# Patient Record
Sex: Male | Born: 1945 | Race: Black or African American | Hispanic: No | Smoking: Current every day smoker
Health system: Southern US, Community
[De-identification: ages and names within clinical notes are randomized; demographics above are authoritative.]

## PROBLEM LIST (undated history)

## (undated) ENCOUNTER — Emergency Department (HOSPITAL_COMMUNITY): Admission: EM | Payer: Medicare Other | Source: Home / Self Care

## (undated) DIAGNOSIS — M545 Low back pain, unspecified: Secondary | ICD-10-CM

## (undated) DIAGNOSIS — F1910 Other psychoactive substance abuse, uncomplicated: Secondary | ICD-10-CM

## (undated) DIAGNOSIS — G43909 Migraine, unspecified, not intractable, without status migrainosus: Secondary | ICD-10-CM

## (undated) DIAGNOSIS — I2510 Atherosclerotic heart disease of native coronary artery without angina pectoris: Secondary | ICD-10-CM

## (undated) DIAGNOSIS — F32A Depression, unspecified: Secondary | ICD-10-CM

## (undated) DIAGNOSIS — C189 Malignant neoplasm of colon, unspecified: Secondary | ICD-10-CM

## (undated) DIAGNOSIS — G8929 Other chronic pain: Secondary | ICD-10-CM

## (undated) DIAGNOSIS — K219 Gastro-esophageal reflux disease without esophagitis: Secondary | ICD-10-CM

## (undated) DIAGNOSIS — I5022 Chronic systolic (congestive) heart failure: Secondary | ICD-10-CM

## (undated) DIAGNOSIS — M1990 Unspecified osteoarthritis, unspecified site: Secondary | ICD-10-CM

## (undated) DIAGNOSIS — Z720 Tobacco use: Secondary | ICD-10-CM

## (undated) DIAGNOSIS — B1920 Unspecified viral hepatitis C without hepatic coma: Secondary | ICD-10-CM

## (undated) DIAGNOSIS — I4819 Other persistent atrial fibrillation: Secondary | ICD-10-CM

## (undated) DIAGNOSIS — J449 Chronic obstructive pulmonary disease, unspecified: Secondary | ICD-10-CM

## (undated) DIAGNOSIS — F419 Anxiety disorder, unspecified: Secondary | ICD-10-CM

## (undated) DIAGNOSIS — I10 Essential (primary) hypertension: Secondary | ICD-10-CM

## (undated) DIAGNOSIS — I428 Other cardiomyopathies: Secondary | ICD-10-CM

## (undated) DIAGNOSIS — F329 Major depressive disorder, single episode, unspecified: Secondary | ICD-10-CM

## (undated) HISTORY — DX: Atherosclerotic heart disease of native coronary artery without angina pectoris: I25.10

## (undated) HISTORY — DX: Other persistent atrial fibrillation: I48.19

## (undated) HISTORY — DX: Chronic systolic (congestive) heart failure: I50.22

## (undated) HISTORY — PX: CARDIOVERSION: SHX1299

## (undated) HISTORY — DX: Other cardiomyopathies: I42.8

## (undated) HISTORY — DX: Other psychoactive substance abuse, uncomplicated: F19.10

## (undated) HISTORY — PX: COLONOSCOPY: SHX174

---

## 2012-07-22 DIAGNOSIS — J449 Chronic obstructive pulmonary disease, unspecified: Secondary | ICD-10-CM | POA: Diagnosis not present

## 2012-07-22 DIAGNOSIS — R0609 Other forms of dyspnea: Secondary | ICD-10-CM | POA: Diagnosis not present

## 2012-08-14 DIAGNOSIS — I5020 Unspecified systolic (congestive) heart failure: Secondary | ICD-10-CM | POA: Insufficient documentation

## 2012-11-16 DIAGNOSIS — I509 Heart failure, unspecified: Secondary | ICD-10-CM | POA: Diagnosis not present

## 2012-11-16 DIAGNOSIS — R059 Cough, unspecified: Secondary | ICD-10-CM | POA: Diagnosis not present

## 2012-11-16 DIAGNOSIS — R05 Cough: Secondary | ICD-10-CM | POA: Diagnosis not present

## 2012-11-16 DIAGNOSIS — R0602 Shortness of breath: Secondary | ICD-10-CM | POA: Diagnosis not present

## 2012-11-16 DIAGNOSIS — R9431 Abnormal electrocardiogram [ECG] [EKG]: Secondary | ICD-10-CM | POA: Diagnosis not present

## 2012-12-01 DIAGNOSIS — R05 Cough: Secondary | ICD-10-CM | POA: Diagnosis not present

## 2012-12-01 DIAGNOSIS — R059 Cough, unspecified: Secondary | ICD-10-CM | POA: Diagnosis not present

## 2012-12-01 DIAGNOSIS — R0789 Other chest pain: Secondary | ICD-10-CM | POA: Diagnosis not present

## 2012-12-01 DIAGNOSIS — Z760 Encounter for issue of repeat prescription: Secondary | ICD-10-CM | POA: Diagnosis not present

## 2012-12-01 DIAGNOSIS — R0602 Shortness of breath: Secondary | ICD-10-CM | POA: Diagnosis not present

## 2012-12-14 DIAGNOSIS — J440 Chronic obstructive pulmonary disease with acute lower respiratory infection: Secondary | ICD-10-CM | POA: Diagnosis not present

## 2012-12-14 DIAGNOSIS — R0602 Shortness of breath: Secondary | ICD-10-CM | POA: Diagnosis not present

## 2013-01-05 DIAGNOSIS — R1013 Epigastric pain: Secondary | ICD-10-CM | POA: Diagnosis not present

## 2013-01-05 DIAGNOSIS — K2970 Gastritis, unspecified, without bleeding: Secondary | ICD-10-CM | POA: Diagnosis not present

## 2013-01-05 DIAGNOSIS — N390 Urinary tract infection, site not specified: Secondary | ICD-10-CM | POA: Diagnosis not present

## 2013-01-05 DIAGNOSIS — K2990 Gastroduodenitis, unspecified, without bleeding: Secondary | ICD-10-CM | POA: Diagnosis not present

## 2013-01-05 DIAGNOSIS — R0789 Other chest pain: Secondary | ICD-10-CM | POA: Diagnosis not present

## 2013-02-16 DIAGNOSIS — J441 Chronic obstructive pulmonary disease with (acute) exacerbation: Secondary | ICD-10-CM | POA: Diagnosis not present

## 2013-02-16 DIAGNOSIS — F17200 Nicotine dependence, unspecified, uncomplicated: Secondary | ICD-10-CM | POA: Diagnosis not present

## 2013-02-16 DIAGNOSIS — J449 Chronic obstructive pulmonary disease, unspecified: Secondary | ICD-10-CM | POA: Diagnosis not present

## 2013-02-16 DIAGNOSIS — R0609 Other forms of dyspnea: Secondary | ICD-10-CM | POA: Diagnosis not present

## 2013-02-16 DIAGNOSIS — R079 Chest pain, unspecified: Secondary | ICD-10-CM | POA: Diagnosis not present

## 2013-02-21 DIAGNOSIS — S01501A Unspecified open wound of lip, initial encounter: Secondary | ICD-10-CM | POA: Diagnosis not present

## 2013-02-21 DIAGNOSIS — S0990XA Unspecified injury of head, initial encounter: Secondary | ICD-10-CM | POA: Diagnosis not present

## 2013-02-21 DIAGNOSIS — F17200 Nicotine dependence, unspecified, uncomplicated: Secondary | ICD-10-CM | POA: Diagnosis not present

## 2013-02-21 DIAGNOSIS — S1093XA Contusion of unspecified part of neck, initial encounter: Secondary | ICD-10-CM | POA: Diagnosis not present

## 2013-02-21 DIAGNOSIS — S0083XA Contusion of other part of head, initial encounter: Secondary | ICD-10-CM | POA: Diagnosis not present

## 2013-02-21 DIAGNOSIS — T7491XA Unspecified adult maltreatment, confirmed, initial encounter: Secondary | ICD-10-CM | POA: Diagnosis not present

## 2013-02-21 DIAGNOSIS — R079 Chest pain, unspecified: Secondary | ICD-10-CM | POA: Diagnosis not present

## 2013-02-21 DIAGNOSIS — Z049 Encounter for examination and observation for unspecified reason: Secondary | ICD-10-CM | POA: Diagnosis not present

## 2013-02-21 DIAGNOSIS — S0003XA Contusion of scalp, initial encounter: Secondary | ICD-10-CM | POA: Diagnosis not present

## 2013-02-22 DIAGNOSIS — F1910 Other psychoactive substance abuse, uncomplicated: Secondary | ICD-10-CM | POA: Insufficient documentation

## 2013-02-22 DIAGNOSIS — R079 Chest pain, unspecified: Secondary | ICD-10-CM | POA: Diagnosis not present

## 2013-02-22 DIAGNOSIS — R141 Gas pain: Secondary | ICD-10-CM | POA: Diagnosis not present

## 2013-02-22 DIAGNOSIS — R8281 Pyuria: Secondary | ICD-10-CM | POA: Insufficient documentation

## 2013-02-23 DIAGNOSIS — R079 Chest pain, unspecified: Secondary | ICD-10-CM | POA: Diagnosis not present

## 2013-02-24 DIAGNOSIS — R079 Chest pain, unspecified: Secondary | ICD-10-CM | POA: Diagnosis not present

## 2013-03-24 DIAGNOSIS — R0602 Shortness of breath: Secondary | ICD-10-CM | POA: Diagnosis not present

## 2013-03-24 DIAGNOSIS — Z049 Encounter for examination and observation for unspecified reason: Secondary | ICD-10-CM | POA: Diagnosis not present

## 2013-04-29 DIAGNOSIS — I499 Cardiac arrhythmia, unspecified: Secondary | ICD-10-CM | POA: Diagnosis not present

## 2013-04-29 DIAGNOSIS — I10 Essential (primary) hypertension: Secondary | ICD-10-CM | POA: Diagnosis not present

## 2013-04-29 DIAGNOSIS — I509 Heart failure, unspecified: Secondary | ICD-10-CM | POA: Diagnosis not present

## 2013-04-29 DIAGNOSIS — IMO0002 Reserved for concepts with insufficient information to code with codable children: Secondary | ICD-10-CM | POA: Diagnosis not present

## 2013-04-29 DIAGNOSIS — Z7982 Long term (current) use of aspirin: Secondary | ICD-10-CM | POA: Diagnosis not present

## 2013-04-29 DIAGNOSIS — S0990XA Unspecified injury of head, initial encounter: Secondary | ICD-10-CM | POA: Diagnosis not present

## 2013-04-29 DIAGNOSIS — R52 Pain, unspecified: Secondary | ICD-10-CM | POA: Diagnosis not present

## 2013-04-29 DIAGNOSIS — S60229A Contusion of unspecified hand, initial encounter: Secondary | ICD-10-CM | POA: Diagnosis not present

## 2013-04-29 DIAGNOSIS — J449 Chronic obstructive pulmonary disease, unspecified: Secondary | ICD-10-CM | POA: Diagnosis not present

## 2013-04-29 DIAGNOSIS — S0003XA Contusion of scalp, initial encounter: Secondary | ICD-10-CM | POA: Diagnosis not present

## 2013-04-29 DIAGNOSIS — S1093XA Contusion of unspecified part of neck, initial encounter: Secondary | ICD-10-CM | POA: Diagnosis not present

## 2013-04-29 DIAGNOSIS — E785 Hyperlipidemia, unspecified: Secondary | ICD-10-CM | POA: Diagnosis not present

## 2013-04-29 DIAGNOSIS — F17200 Nicotine dependence, unspecified, uncomplicated: Secondary | ICD-10-CM | POA: Diagnosis not present

## 2013-04-29 DIAGNOSIS — S060X1A Concussion with loss of consciousness of 30 minutes or less, initial encounter: Secondary | ICD-10-CM | POA: Diagnosis not present

## 2013-04-29 DIAGNOSIS — Z79899 Other long term (current) drug therapy: Secondary | ICD-10-CM | POA: Diagnosis not present

## 2013-05-04 DIAGNOSIS — S022XXA Fracture of nasal bones, initial encounter for closed fracture: Secondary | ICD-10-CM | POA: Diagnosis not present

## 2013-05-04 DIAGNOSIS — S0990XA Unspecified injury of head, initial encounter: Secondary | ICD-10-CM | POA: Diagnosis not present

## 2013-05-04 DIAGNOSIS — IMO0002 Reserved for concepts with insufficient information to code with codable children: Secondary | ICD-10-CM | POA: Diagnosis not present

## 2013-05-04 DIAGNOSIS — K1370 Unspecified lesions of oral mucosa: Secondary | ICD-10-CM | POA: Diagnosis not present

## 2013-05-04 DIAGNOSIS — J329 Chronic sinusitis, unspecified: Secondary | ICD-10-CM | POA: Diagnosis not present

## 2013-06-01 DIAGNOSIS — R972 Elevated prostate specific antigen [PSA]: Secondary | ICD-10-CM | POA: Diagnosis not present

## 2013-06-06 DIAGNOSIS — F17200 Nicotine dependence, unspecified, uncomplicated: Secondary | ICD-10-CM | POA: Insufficient documentation

## 2013-06-06 DIAGNOSIS — R062 Wheezing: Secondary | ICD-10-CM | POA: Diagnosis not present

## 2013-06-06 DIAGNOSIS — R0602 Shortness of breath: Secondary | ICD-10-CM | POA: Diagnosis not present

## 2013-06-06 DIAGNOSIS — I499 Cardiac arrhythmia, unspecified: Secondary | ICD-10-CM | POA: Diagnosis not present

## 2013-06-06 DIAGNOSIS — I509 Heart failure, unspecified: Secondary | ICD-10-CM | POA: Diagnosis not present

## 2013-06-06 DIAGNOSIS — R5381 Other malaise: Secondary | ICD-10-CM | POA: Diagnosis not present

## 2013-06-06 DIAGNOSIS — R5383 Other fatigue: Secondary | ICD-10-CM | POA: Diagnosis not present

## 2013-06-06 DIAGNOSIS — R109 Unspecified abdominal pain: Secondary | ICD-10-CM | POA: Diagnosis not present

## 2013-06-06 DIAGNOSIS — J449 Chronic obstructive pulmonary disease, unspecified: Secondary | ICD-10-CM | POA: Diagnosis not present

## 2013-06-07 DIAGNOSIS — R079 Chest pain, unspecified: Secondary | ICD-10-CM | POA: Diagnosis not present

## 2013-06-07 DIAGNOSIS — I509 Heart failure, unspecified: Secondary | ICD-10-CM | POA: Diagnosis not present

## 2013-06-07 DIAGNOSIS — J440 Chronic obstructive pulmonary disease with acute lower respiratory infection: Secondary | ICD-10-CM | POA: Diagnosis not present

## 2013-06-07 DIAGNOSIS — R1012 Left upper quadrant pain: Secondary | ICD-10-CM | POA: Diagnosis not present

## 2013-06-07 DIAGNOSIS — R0789 Other chest pain: Secondary | ICD-10-CM | POA: Diagnosis not present

## 2013-06-08 DIAGNOSIS — R0789 Other chest pain: Secondary | ICD-10-CM | POA: Diagnosis not present

## 2013-06-08 DIAGNOSIS — I509 Heart failure, unspecified: Secondary | ICD-10-CM | POA: Diagnosis not present

## 2013-06-08 DIAGNOSIS — J440 Chronic obstructive pulmonary disease with acute lower respiratory infection: Secondary | ICD-10-CM | POA: Diagnosis not present

## 2013-06-08 DIAGNOSIS — R109 Unspecified abdominal pain: Secondary | ICD-10-CM | POA: Diagnosis not present

## 2013-06-08 DIAGNOSIS — R1012 Left upper quadrant pain: Secondary | ICD-10-CM | POA: Diagnosis not present

## 2013-06-25 DIAGNOSIS — J449 Chronic obstructive pulmonary disease, unspecified: Secondary | ICD-10-CM | POA: Diagnosis not present

## 2013-06-25 DIAGNOSIS — R5381 Other malaise: Secondary | ICD-10-CM | POA: Diagnosis not present

## 2013-06-25 DIAGNOSIS — R5383 Other fatigue: Secondary | ICD-10-CM | POA: Diagnosis not present

## 2013-06-25 DIAGNOSIS — R6889 Other general symptoms and signs: Secondary | ICD-10-CM | POA: Diagnosis not present

## 2013-06-25 DIAGNOSIS — I4891 Unspecified atrial fibrillation: Secondary | ICD-10-CM | POA: Diagnosis not present

## 2013-06-25 DIAGNOSIS — R079 Chest pain, unspecified: Secondary | ICD-10-CM | POA: Diagnosis not present

## 2013-06-26 DIAGNOSIS — J449 Chronic obstructive pulmonary disease, unspecified: Secondary | ICD-10-CM | POA: Diagnosis not present

## 2013-06-26 DIAGNOSIS — R079 Chest pain, unspecified: Secondary | ICD-10-CM | POA: Diagnosis not present

## 2013-06-26 DIAGNOSIS — I10 Essential (primary) hypertension: Secondary | ICD-10-CM | POA: Diagnosis not present

## 2013-06-26 DIAGNOSIS — I4891 Unspecified atrial fibrillation: Secondary | ICD-10-CM | POA: Diagnosis not present

## 2013-06-27 DIAGNOSIS — I4891 Unspecified atrial fibrillation: Secondary | ICD-10-CM | POA: Diagnosis not present

## 2013-06-28 DIAGNOSIS — I4891 Unspecified atrial fibrillation: Secondary | ICD-10-CM | POA: Diagnosis not present

## 2013-06-29 DIAGNOSIS — I4891 Unspecified atrial fibrillation: Secondary | ICD-10-CM | POA: Diagnosis not present

## 2013-06-30 DIAGNOSIS — I4891 Unspecified atrial fibrillation: Secondary | ICD-10-CM | POA: Diagnosis not present

## 2013-07-01 DIAGNOSIS — I4891 Unspecified atrial fibrillation: Secondary | ICD-10-CM | POA: Diagnosis not present

## 2013-07-22 DIAGNOSIS — J449 Chronic obstructive pulmonary disease, unspecified: Secondary | ICD-10-CM | POA: Diagnosis not present

## 2013-07-22 DIAGNOSIS — I5020 Unspecified systolic (congestive) heart failure: Secondary | ICD-10-CM | POA: Diagnosis not present

## 2013-07-22 DIAGNOSIS — I2584 Coronary atherosclerosis due to calcified coronary lesion: Secondary | ICD-10-CM | POA: Diagnosis not present

## 2013-07-22 DIAGNOSIS — R079 Chest pain, unspecified: Secondary | ICD-10-CM | POA: Diagnosis not present

## 2013-07-22 DIAGNOSIS — R9431 Abnormal electrocardiogram [ECG] [EKG]: Secondary | ICD-10-CM | POA: Diagnosis not present

## 2013-07-22 DIAGNOSIS — I4949 Other premature depolarization: Secondary | ICD-10-CM | POA: Diagnosis not present

## 2013-07-22 DIAGNOSIS — R0602 Shortness of breath: Secondary | ICD-10-CM | POA: Diagnosis not present

## 2013-07-22 DIAGNOSIS — I509 Heart failure, unspecified: Secondary | ICD-10-CM | POA: Diagnosis not present

## 2013-07-22 DIAGNOSIS — Z7982 Long term (current) use of aspirin: Secondary | ICD-10-CM | POA: Diagnosis not present

## 2013-07-22 DIAGNOSIS — R52 Pain, unspecified: Secondary | ICD-10-CM | POA: Diagnosis not present

## 2013-07-22 DIAGNOSIS — F17200 Nicotine dependence, unspecified, uncomplicated: Secondary | ICD-10-CM | POA: Diagnosis not present

## 2013-07-22 DIAGNOSIS — Z79899 Other long term (current) drug therapy: Secondary | ICD-10-CM | POA: Diagnosis not present

## 2013-07-22 DIAGNOSIS — I10 Essential (primary) hypertension: Secondary | ICD-10-CM | POA: Diagnosis not present

## 2013-07-22 DIAGNOSIS — Z8619 Personal history of other infectious and parasitic diseases: Secondary | ICD-10-CM | POA: Diagnosis not present

## 2013-07-22 DIAGNOSIS — E785 Hyperlipidemia, unspecified: Secondary | ICD-10-CM | POA: Diagnosis not present

## 2013-09-07 DIAGNOSIS — R0602 Shortness of breath: Secondary | ICD-10-CM | POA: Diagnosis not present

## 2013-09-07 DIAGNOSIS — R059 Cough, unspecified: Secondary | ICD-10-CM | POA: Diagnosis not present

## 2013-09-07 DIAGNOSIS — R05 Cough: Secondary | ICD-10-CM | POA: Diagnosis not present

## 2013-09-07 DIAGNOSIS — J441 Chronic obstructive pulmonary disease with (acute) exacerbation: Secondary | ICD-10-CM | POA: Diagnosis not present

## 2013-09-07 DIAGNOSIS — Z760 Encounter for issue of repeat prescription: Secondary | ICD-10-CM | POA: Diagnosis not present

## 2013-09-07 DIAGNOSIS — R52 Pain, unspecified: Secondary | ICD-10-CM | POA: Diagnosis not present

## 2013-09-10 DIAGNOSIS — R109 Unspecified abdominal pain: Secondary | ICD-10-CM | POA: Diagnosis not present

## 2013-09-10 DIAGNOSIS — I420 Dilated cardiomyopathy: Secondary | ICD-10-CM | POA: Insufficient documentation

## 2013-09-10 DIAGNOSIS — I5022 Chronic systolic (congestive) heart failure: Secondary | ICD-10-CM | POA: Diagnosis not present

## 2013-09-10 DIAGNOSIS — J8410 Pulmonary fibrosis, unspecified: Secondary | ICD-10-CM | POA: Diagnosis not present

## 2013-09-10 DIAGNOSIS — R0602 Shortness of breath: Secondary | ICD-10-CM | POA: Diagnosis not present

## 2013-09-10 DIAGNOSIS — I501 Left ventricular failure: Secondary | ICD-10-CM | POA: Diagnosis not present

## 2013-09-10 DIAGNOSIS — R079 Chest pain, unspecified: Secondary | ICD-10-CM | POA: Diagnosis not present

## 2013-09-10 DIAGNOSIS — I428 Other cardiomyopathies: Secondary | ICD-10-CM | POA: Diagnosis not present

## 2013-09-11 DIAGNOSIS — R0602 Shortness of breath: Secondary | ICD-10-CM | POA: Diagnosis not present

## 2013-09-26 DIAGNOSIS — G4700 Insomnia, unspecified: Secondary | ICD-10-CM | POA: Diagnosis not present

## 2013-09-26 DIAGNOSIS — I509 Heart failure, unspecified: Secondary | ICD-10-CM | POA: Diagnosis not present

## 2013-10-23 DIAGNOSIS — J449 Chronic obstructive pulmonary disease, unspecified: Secondary | ICD-10-CM | POA: Diagnosis not present

## 2013-10-23 DIAGNOSIS — R0602 Shortness of breath: Secondary | ICD-10-CM | POA: Diagnosis not present

## 2013-10-23 DIAGNOSIS — R062 Wheezing: Secondary | ICD-10-CM | POA: Diagnosis not present

## 2013-10-23 DIAGNOSIS — Z79899 Other long term (current) drug therapy: Secondary | ICD-10-CM | POA: Diagnosis not present

## 2013-10-23 DIAGNOSIS — Z760 Encounter for issue of repeat prescription: Secondary | ICD-10-CM | POA: Diagnosis not present

## 2013-10-23 DIAGNOSIS — I10 Essential (primary) hypertension: Secondary | ICD-10-CM | POA: Diagnosis not present

## 2013-10-23 DIAGNOSIS — R6889 Other general symptoms and signs: Secondary | ICD-10-CM | POA: Diagnosis not present

## 2013-10-23 DIAGNOSIS — F17200 Nicotine dependence, unspecified, uncomplicated: Secondary | ICD-10-CM | POA: Diagnosis not present

## 2014-05-25 DIAGNOSIS — I499 Cardiac arrhythmia, unspecified: Secondary | ICD-10-CM | POA: Diagnosis not present

## 2014-05-25 DIAGNOSIS — M79609 Pain in unspecified limb: Secondary | ICD-10-CM | POA: Diagnosis not present

## 2014-05-25 DIAGNOSIS — M25579 Pain in unspecified ankle and joints of unspecified foot: Secondary | ICD-10-CM | POA: Diagnosis not present

## 2014-07-24 DIAGNOSIS — R42 Dizziness and giddiness: Secondary | ICD-10-CM | POA: Diagnosis not present

## 2014-07-24 DIAGNOSIS — R079 Chest pain, unspecified: Secondary | ICD-10-CM | POA: Diagnosis not present

## 2014-07-29 DIAGNOSIS — R079 Chest pain, unspecified: Secondary | ICD-10-CM | POA: Diagnosis not present

## 2014-07-29 DIAGNOSIS — I209 Angina pectoris, unspecified: Secondary | ICD-10-CM | POA: Diagnosis not present

## 2014-07-30 DIAGNOSIS — R509 Fever, unspecified: Secondary | ICD-10-CM | POA: Diagnosis not present

## 2014-07-31 DIAGNOSIS — R079 Chest pain, unspecified: Secondary | ICD-10-CM | POA: Diagnosis not present

## 2014-07-31 DIAGNOSIS — I209 Angina pectoris, unspecified: Secondary | ICD-10-CM | POA: Diagnosis not present

## 2014-08-01 DIAGNOSIS — I209 Angina pectoris, unspecified: Secondary | ICD-10-CM | POA: Diagnosis not present

## 2014-08-02 DIAGNOSIS — I209 Angina pectoris, unspecified: Secondary | ICD-10-CM | POA: Diagnosis not present

## 2014-08-03 DIAGNOSIS — J189 Pneumonia, unspecified organism: Secondary | ICD-10-CM | POA: Diagnosis not present

## 2014-08-03 DIAGNOSIS — I209 Angina pectoris, unspecified: Secondary | ICD-10-CM | POA: Diagnosis not present

## 2014-08-03 DIAGNOSIS — I509 Heart failure, unspecified: Secondary | ICD-10-CM | POA: Diagnosis not present

## 2014-08-03 DIAGNOSIS — I517 Cardiomegaly: Secondary | ICD-10-CM | POA: Diagnosis not present

## 2014-08-03 DIAGNOSIS — I081 Rheumatic disorders of both mitral and tricuspid valves: Secondary | ICD-10-CM | POA: Diagnosis not present

## 2014-08-03 DIAGNOSIS — I5023 Acute on chronic systolic (congestive) heart failure: Secondary | ICD-10-CM | POA: Diagnosis not present

## 2014-08-03 DIAGNOSIS — I4891 Unspecified atrial fibrillation: Secondary | ICD-10-CM | POA: Diagnosis not present

## 2014-08-04 DIAGNOSIS — I509 Heart failure, unspecified: Secondary | ICD-10-CM | POA: Diagnosis not present

## 2014-08-04 DIAGNOSIS — I209 Angina pectoris, unspecified: Secondary | ICD-10-CM | POA: Diagnosis not present

## 2014-08-04 DIAGNOSIS — I5023 Acute on chronic systolic (congestive) heart failure: Secondary | ICD-10-CM | POA: Diagnosis not present

## 2014-08-04 DIAGNOSIS — I4891 Unspecified atrial fibrillation: Secondary | ICD-10-CM | POA: Insufficient documentation

## 2014-08-09 DIAGNOSIS — R002 Palpitations: Secondary | ICD-10-CM | POA: Diagnosis not present

## 2014-08-09 DIAGNOSIS — F17210 Nicotine dependence, cigarettes, uncomplicated: Secondary | ICD-10-CM | POA: Diagnosis not present

## 2014-08-09 DIAGNOSIS — I10 Essential (primary) hypertension: Secondary | ICD-10-CM | POA: Diagnosis not present

## 2014-08-09 DIAGNOSIS — R61 Generalized hyperhidrosis: Secondary | ICD-10-CM | POA: Diagnosis not present

## 2014-08-09 DIAGNOSIS — Z7982 Long term (current) use of aspirin: Secondary | ICD-10-CM | POA: Diagnosis not present

## 2014-08-09 DIAGNOSIS — R0789 Other chest pain: Secondary | ICD-10-CM | POA: Diagnosis not present

## 2014-08-09 DIAGNOSIS — J449 Chronic obstructive pulmonary disease, unspecified: Secondary | ICD-10-CM | POA: Diagnosis not present

## 2014-08-09 DIAGNOSIS — Z79899 Other long term (current) drug therapy: Secondary | ICD-10-CM | POA: Diagnosis not present

## 2014-08-09 DIAGNOSIS — R079 Chest pain, unspecified: Secondary | ICD-10-CM | POA: Diagnosis not present

## 2014-08-09 DIAGNOSIS — E785 Hyperlipidemia, unspecified: Secondary | ICD-10-CM | POA: Diagnosis not present

## 2014-08-09 DIAGNOSIS — I5042 Chronic combined systolic (congestive) and diastolic (congestive) heart failure: Secondary | ICD-10-CM | POA: Diagnosis not present

## 2014-08-09 DIAGNOSIS — I4891 Unspecified atrial fibrillation: Secondary | ICD-10-CM | POA: Diagnosis not present

## 2014-08-16 DIAGNOSIS — R002 Palpitations: Secondary | ICD-10-CM | POA: Diagnosis not present

## 2014-08-16 DIAGNOSIS — I517 Cardiomegaly: Secondary | ICD-10-CM | POA: Diagnosis not present

## 2014-08-16 DIAGNOSIS — R0602 Shortness of breath: Secondary | ICD-10-CM | POA: Diagnosis not present

## 2014-08-21 DIAGNOSIS — I517 Cardiomegaly: Secondary | ICD-10-CM | POA: Diagnosis not present

## 2014-08-21 DIAGNOSIS — K922 Gastrointestinal hemorrhage, unspecified: Secondary | ICD-10-CM | POA: Insufficient documentation

## 2014-08-21 DIAGNOSIS — R079 Chest pain, unspecified: Secondary | ICD-10-CM | POA: Diagnosis not present

## 2014-08-21 DIAGNOSIS — I4891 Unspecified atrial fibrillation: Secondary | ICD-10-CM | POA: Diagnosis not present

## 2014-08-22 DIAGNOSIS — D122 Benign neoplasm of ascending colon: Secondary | ICD-10-CM | POA: Diagnosis not present

## 2014-08-22 DIAGNOSIS — K635 Polyp of colon: Secondary | ICD-10-CM | POA: Diagnosis not present

## 2014-08-22 DIAGNOSIS — K559 Vascular disorder of intestine, unspecified: Secondary | ICD-10-CM | POA: Diagnosis not present

## 2014-08-22 DIAGNOSIS — R079 Chest pain, unspecified: Secondary | ICD-10-CM | POA: Diagnosis not present

## 2014-08-22 DIAGNOSIS — D120 Benign neoplasm of cecum: Secondary | ICD-10-CM | POA: Diagnosis not present

## 2014-08-22 DIAGNOSIS — K625 Hemorrhage of anus and rectum: Secondary | ICD-10-CM | POA: Diagnosis not present

## 2014-08-22 DIAGNOSIS — C187 Malignant neoplasm of sigmoid colon: Secondary | ICD-10-CM | POA: Diagnosis not present

## 2014-08-22 DIAGNOSIS — D649 Anemia, unspecified: Secondary | ICD-10-CM | POA: Diagnosis not present

## 2014-08-22 DIAGNOSIS — D123 Benign neoplasm of transverse colon: Secondary | ICD-10-CM | POA: Diagnosis not present

## 2014-08-22 DIAGNOSIS — J90 Pleural effusion, not elsewhere classified: Secondary | ICD-10-CM | POA: Diagnosis not present

## 2014-08-22 DIAGNOSIS — R55 Syncope and collapse: Secondary | ICD-10-CM | POA: Diagnosis not present

## 2014-08-22 DIAGNOSIS — I4891 Unspecified atrial fibrillation: Secondary | ICD-10-CM | POA: Diagnosis not present

## 2014-08-23 DIAGNOSIS — D649 Anemia, unspecified: Secondary | ICD-10-CM | POA: Diagnosis not present

## 2014-08-23 DIAGNOSIS — K625 Hemorrhage of anus and rectum: Secondary | ICD-10-CM | POA: Diagnosis not present

## 2014-08-23 DIAGNOSIS — I4891 Unspecified atrial fibrillation: Secondary | ICD-10-CM | POA: Diagnosis not present

## 2014-08-23 DIAGNOSIS — D123 Benign neoplasm of transverse colon: Secondary | ICD-10-CM | POA: Diagnosis not present

## 2014-08-23 DIAGNOSIS — D120 Benign neoplasm of cecum: Secondary | ICD-10-CM | POA: Diagnosis not present

## 2014-08-23 DIAGNOSIS — C187 Malignant neoplasm of sigmoid colon: Secondary | ICD-10-CM | POA: Diagnosis not present

## 2014-08-23 DIAGNOSIS — D122 Benign neoplasm of ascending colon: Secondary | ICD-10-CM | POA: Diagnosis not present

## 2014-08-23 DIAGNOSIS — K635 Polyp of colon: Secondary | ICD-10-CM | POA: Diagnosis not present

## 2014-08-24 DIAGNOSIS — D649 Anemia, unspecified: Secondary | ICD-10-CM | POA: Diagnosis not present

## 2014-08-24 DIAGNOSIS — D123 Benign neoplasm of transverse colon: Secondary | ICD-10-CM | POA: Diagnosis not present

## 2014-08-24 DIAGNOSIS — K635 Polyp of colon: Secondary | ICD-10-CM | POA: Diagnosis not present

## 2014-08-24 DIAGNOSIS — I4891 Unspecified atrial fibrillation: Secondary | ICD-10-CM | POA: Diagnosis not present

## 2014-08-24 DIAGNOSIS — D122 Benign neoplasm of ascending colon: Secondary | ICD-10-CM | POA: Diagnosis not present

## 2014-08-24 DIAGNOSIS — C187 Malignant neoplasm of sigmoid colon: Secondary | ICD-10-CM | POA: Diagnosis not present

## 2014-08-24 DIAGNOSIS — D120 Benign neoplasm of cecum: Secondary | ICD-10-CM | POA: Diagnosis not present

## 2014-08-24 DIAGNOSIS — K625 Hemorrhage of anus and rectum: Secondary | ICD-10-CM | POA: Diagnosis not present

## 2014-08-25 DIAGNOSIS — D122 Benign neoplasm of ascending colon: Secondary | ICD-10-CM | POA: Diagnosis not present

## 2014-08-25 DIAGNOSIS — K635 Polyp of colon: Secondary | ICD-10-CM | POA: Diagnosis not present

## 2014-08-25 DIAGNOSIS — C189 Malignant neoplasm of colon, unspecified: Secondary | ICD-10-CM | POA: Diagnosis not present

## 2014-08-25 DIAGNOSIS — D120 Benign neoplasm of cecum: Secondary | ICD-10-CM | POA: Diagnosis not present

## 2014-08-25 DIAGNOSIS — D123 Benign neoplasm of transverse colon: Secondary | ICD-10-CM | POA: Diagnosis not present

## 2014-08-25 DIAGNOSIS — D649 Anemia, unspecified: Secondary | ICD-10-CM | POA: Diagnosis not present

## 2014-08-25 DIAGNOSIS — K625 Hemorrhage of anus and rectum: Secondary | ICD-10-CM | POA: Diagnosis not present

## 2014-08-25 DIAGNOSIS — R911 Solitary pulmonary nodule: Secondary | ICD-10-CM | POA: Diagnosis not present

## 2014-08-25 DIAGNOSIS — C187 Malignant neoplasm of sigmoid colon: Secondary | ICD-10-CM | POA: Diagnosis not present

## 2014-08-26 DIAGNOSIS — K922 Gastrointestinal hemorrhage, unspecified: Secondary | ICD-10-CM | POA: Diagnosis not present

## 2014-08-26 DIAGNOSIS — I4891 Unspecified atrial fibrillation: Secondary | ICD-10-CM | POA: Diagnosis not present

## 2014-08-29 DIAGNOSIS — C189 Malignant neoplasm of colon, unspecified: Secondary | ICD-10-CM | POA: Diagnosis not present

## 2014-08-29 DIAGNOSIS — R079 Chest pain, unspecified: Secondary | ICD-10-CM | POA: Diagnosis not present

## 2014-08-29 DIAGNOSIS — I4891 Unspecified atrial fibrillation: Secondary | ICD-10-CM | POA: Diagnosis not present

## 2014-09-01 DIAGNOSIS — Z7951 Long term (current) use of inhaled steroids: Secondary | ICD-10-CM | POA: Diagnosis not present

## 2014-09-01 DIAGNOSIS — R079 Chest pain, unspecified: Secondary | ICD-10-CM | POA: Diagnosis not present

## 2014-09-01 DIAGNOSIS — R002 Palpitations: Secondary | ICD-10-CM | POA: Diagnosis not present

## 2014-09-01 DIAGNOSIS — R0602 Shortness of breath: Secondary | ICD-10-CM | POA: Diagnosis not present

## 2014-09-01 DIAGNOSIS — I10 Essential (primary) hypertension: Secondary | ICD-10-CM | POA: Diagnosis not present

## 2014-09-01 DIAGNOSIS — Z8619 Personal history of other infectious and parasitic diseases: Secondary | ICD-10-CM | POA: Diagnosis not present

## 2014-09-01 DIAGNOSIS — Z79899 Other long term (current) drug therapy: Secondary | ICD-10-CM | POA: Diagnosis not present

## 2014-09-01 DIAGNOSIS — E780 Pure hypercholesterolemia: Secondary | ICD-10-CM | POA: Diagnosis not present

## 2014-09-01 DIAGNOSIS — I4891 Unspecified atrial fibrillation: Secondary | ICD-10-CM | POA: Diagnosis not present

## 2014-09-01 DIAGNOSIS — E785 Hyperlipidemia, unspecified: Secondary | ICD-10-CM | POA: Diagnosis not present

## 2014-09-01 DIAGNOSIS — Z7982 Long term (current) use of aspirin: Secondary | ICD-10-CM | POA: Diagnosis not present

## 2014-09-01 DIAGNOSIS — I482 Chronic atrial fibrillation: Secondary | ICD-10-CM | POA: Diagnosis not present

## 2014-09-01 DIAGNOSIS — J449 Chronic obstructive pulmonary disease, unspecified: Secondary | ICD-10-CM | POA: Diagnosis not present

## 2014-09-01 DIAGNOSIS — F17210 Nicotine dependence, cigarettes, uncomplicated: Secondary | ICD-10-CM | POA: Diagnosis not present

## 2014-09-01 DIAGNOSIS — R0789 Other chest pain: Secondary | ICD-10-CM | POA: Diagnosis not present

## 2014-09-08 ENCOUNTER — Encounter (HOSPITAL_COMMUNITY): Payer: Self-pay | Admitting: *Deleted

## 2014-09-08 ENCOUNTER — Emergency Department (HOSPITAL_COMMUNITY)
Admission: EM | Admit: 2014-09-08 | Discharge: 2014-09-08 | Disposition: A | Discharge status: Home / Self Care | Payer: Medicare Other | Attending: Emergency Medicine | Admitting: Emergency Medicine

## 2014-09-08 ENCOUNTER — Emergency Department (HOSPITAL_COMMUNITY): Payer: Medicare Other

## 2014-09-08 DIAGNOSIS — Z87891 Personal history of nicotine dependence: Secondary | ICD-10-CM | POA: Insufficient documentation

## 2014-09-08 DIAGNOSIS — R079 Chest pain, unspecified: Secondary | ICD-10-CM | POA: Diagnosis not present

## 2014-09-08 DIAGNOSIS — R000 Tachycardia, unspecified: Secondary | ICD-10-CM | POA: Insufficient documentation

## 2014-09-08 DIAGNOSIS — J449 Chronic obstructive pulmonary disease, unspecified: Secondary | ICD-10-CM | POA: Insufficient documentation

## 2014-09-08 DIAGNOSIS — Z79899 Other long term (current) drug therapy: Secondary | ICD-10-CM | POA: Insufficient documentation

## 2014-09-08 DIAGNOSIS — R0602 Shortness of breath: Secondary | ICD-10-CM | POA: Insufficient documentation

## 2014-09-08 DIAGNOSIS — I509 Heart failure, unspecified: Secondary | ICD-10-CM | POA: Diagnosis not present

## 2014-09-08 DIAGNOSIS — J439 Emphysema, unspecified: Secondary | ICD-10-CM | POA: Diagnosis not present

## 2014-09-08 DIAGNOSIS — Z7982 Long term (current) use of aspirin: Secondary | ICD-10-CM | POA: Insufficient documentation

## 2014-09-08 DIAGNOSIS — I110 Hypertensive heart disease with heart failure: Secondary | ICD-10-CM | POA: Diagnosis not present

## 2014-09-08 DIAGNOSIS — R0789 Other chest pain: Secondary | ICD-10-CM | POA: Diagnosis not present

## 2014-09-08 DIAGNOSIS — Z8719 Personal history of other diseases of the digestive system: Secondary | ICD-10-CM | POA: Diagnosis not present

## 2014-09-08 HISTORY — DX: Essential (primary) hypertension: I10

## 2014-09-08 HISTORY — DX: Chronic obstructive pulmonary disease, unspecified: J44.9

## 2014-09-08 LAB — PRO B NATRIURETIC PEPTIDE: Pro B Natriuretic peptide (BNP): 3134 pg/mL — ABNORMAL HIGH (ref 0–125)

## 2014-09-08 LAB — CBC
HCT: 34.4 % — ABNORMAL LOW (ref 39.0–52.0)
Hemoglobin: 11.3 g/dL — ABNORMAL LOW (ref 13.0–17.0)
MCH: 31.1 pg (ref 26.0–34.0)
MCHC: 32.8 g/dL (ref 30.0–36.0)
MCV: 94.8 fL (ref 78.0–100.0)
PLATELETS: 185 10*3/uL (ref 150–400)
RBC: 3.63 MIL/uL — ABNORMAL LOW (ref 4.22–5.81)
RDW: 12.8 % (ref 11.5–15.5)
WBC: 8.2 10*3/uL (ref 4.0–10.5)

## 2014-09-08 LAB — BASIC METABOLIC PANEL
Anion gap: 12 (ref 5–15)
BUN: 15 mg/dL (ref 6–23)
CALCIUM: 8.8 mg/dL (ref 8.4–10.5)
CO2: 22 mEq/L (ref 19–32)
Chloride: 106 mEq/L (ref 96–112)
Creatinine, Ser: 0.96 mg/dL (ref 0.50–1.35)
GFR calc Af Amer: >90 mL/min (ref >90)
GFR, EST NON AFRICAN AMERICAN: 84 mL/min — AB (ref >90)
GLUCOSE: 111 mg/dL — AB (ref 70–99)
Potassium: 4 mEq/L (ref 3.7–5.3)
SODIUM: 140 meq/L (ref 137–147)

## 2014-09-08 LAB — I-STAT TROPONIN, ED: Troponin i, poc: 0.01 ng/mL (ref 0.00–0.08)

## 2014-09-08 MED ORDER — METOPROLOL TARTRATE 1 MG/ML IV SOLN
5.0000 mg | Freq: Once | INTRAVENOUS | Status: AC
  Administered 2014-09-08: 5 mg via INTRAVENOUS
  Filled 2014-09-08: qty 5

## 2014-09-08 NOTE — Discharge Instructions (Signed)
°Emergency Department Resource Guide °1) Find a Doctor and Pay Out of Pocket °Although you won't have to find out who is covered by your insurance plan, it is a good idea to ask around and get recommendations. You will then need to call the office and see if the doctor you have chosen will accept you as a new patient and what types of options they offer for patients who are self-pay. Some doctors offer discounts or will set up payment plans for their patients who do not have insurance, but you will need to ask so you aren't surprised when you get to your appointment. ° °2) Contact Your Local Health Department °Not all health departments have doctors that can see patients for sick visits, but many do, so it is worth a call to see if yours does. If you don't know where your local health department is, you can check in your phone book. The CDC also has a tool to help you locate your state's health department, and many state websites also have listings of all of their local health departments. ° °3) Find a Walk-in Clinic °If your illness is not likely to be very severe or complicated, you may want to try a walk in clinic. These are popping up all over the country in pharmacies, drugstores, and shopping centers. They're usually staffed by nurse practitioners or physician assistants that have been trained to treat common illnesses and complaints. They're usually fairly quick and inexpensive. However, if you have serious medical issues or chronic medical problems, these are probably not your best option. ° °No Primary Care Doctor: °- Call [facility] at  832-8000 - they can help you locate a primary care doctor that  accepts your insurance, provides certain services, etc. °- Physician Referral Service- 1-800-533-3463 ° °Chronic Pain Problems: °Organization         Address  Phone   Notes  °[facility]  (336) 297-2271 Patients need to be referred by their primary care doctor.  ° °Medication  Assistance: °Organization         Address  Phone   Notes  °[place] [facility] [address] °[place], [place] [address] (336) 641-8030 --Must be a resident of [place] °-- Must have NO insurance coverage whatsoever (no Medicaid/ Medicare, etc.) °-- The pt. MUST have a primary care doctor that directs their care regularly and follows them in the community °  °[facility]  (866) 331-1348   °[employer]  (888) 892-1162   ° °Agencies that provide inexpensive medical care: °Organization         Address  Phone   Notes  °[facility]  (336) 832-8035   °[facility]    (336) 832-7272   °[facility] [address] °[place], [place] [address] (336) 832-4777   °[facility] [address], °[place] (336) 271-4999   °[facility]    (336) 373-0678   °[facility]    (336) 272-1050   °[facility] ° [address], [place] Phone:  (336) 832-4444, Fax:  (336) 832-4440 Hours of Operation:  9 am - 6 pm, M-F.  Also accepts Medicaid/Medicare and self-pay.  °[facility] ° [address], [place] Phone: (336) 832-3150, Fax: (336) 832-3151. Hours of Operation:  8:30 am - 5:30 pm, M-F.  Also accepts Medicaid and self-pay.  °[facility] [place] [address], [place] Phone: (336) 878-6027   °[facility] [address], [place], [place] (336)723-1848, Ext. 123 Mondays & Thursdays: 7-9 AM.  First 15 patients are seen on a first come, first serve basis. °  ° °Medicaid-accepting [place] Providers: ° °Organization         Address  Phone   Notes  °[facility] [address], [place] (336) 641-2100 Also accepts self-pay patients.  °[facility] [address], [place] ° (336) 856-9996   °[facility] [address], [place]  (336) 288-8857   °[facility] [address] [place] [address], [place] (336) 299-7000   °Veita Bland [address], [place]  ° (336) 373-1557 Only accepts [place] Access Medicaid patients after they have their name applied to their card.  ° °Self-Pay (no insurance) in [place]: ° °Organization         Address  Phone   Notes  °Sickle Cell Patients, [facility] [address], [place] (336) 832-1970   °[facility] [address], [place] (336) 832-4400   °[facility] ° [address], [place] (336) 992-4800   °[facility]/Dr. Osei-Bonsu ° [address] [place] [address], Lane or [address], [place] (336) 841-8500 Phone number for both [place] and [place] locations is the same.  °[facility] [address], [place] (336) 299-0000   °[facility] [address] [place] [address], [place] or [address] (336) 852-7530 °(336) 878-2260   °[facility] [address], [place] (336) 350-1642, phone; (336) 294-5005, fax Sees patients 1st and 3rd Saturday of every month.  Must not qualify for public or private insurance (i.e. Medicaid, Medicare, [place] Health Choice, Veterans' Benefits) • Household income should be no more than 200% of the poverty level •The clinic cannot treat you if you are pregnant or think you are pregnant • Sexually transmitted diseases are not treated at the clinic.  ° ° °Dental Care: °Organization         Address  Phone  Notes  °[place] [facility] [address], [place] (336) 641-6152 Accepts children up to age 21 who are enrolled in Medicaid or [place] Health Choice; pregnant women with a Medicaid card; and children who have applied for Medicaid or [place] Health Choice, but were declined, whose parents can pay a reduced fee at time of service.  °[place]  [facility] [place]  [address], [place] (336) 641-7733 Accepts children up to age 21 who are enrolled in Medicaid or [place] Health Choice; pregnant women with a Medicaid card; and children who have applied for Medicaid or [place] Health Choice, but were declined, whose parents can pay a reduced fee at time of service.  °[facility] ° [address],  (336) 641-4533 Patients are seen by appointment only. Walk-ins are not accepted. [facility] will see patients 18 years of age and older. °Monday - Tuesday (8am-5pm) °Most Wednesdays (8:30-5pm) °$30 per visit, cash only  °[facility] ° [address], [place] (336) 641-4533 Patients are seen by appointment only. Walk-ins are not accepted. [facility] will see patients 18 years of age and older. °One   Wednesday Evening (Monthly: Volunteer Based).  $30 per visit, cash only  °[facility]  (919) 537-3737 for adults; Children under age 4, call Graduate Pediatric Dentistry at (919) [phone]. Children aged 4-14, please call (919) [phone] to request a pediatric application. ° Dental services are provided in all areas of dental care including fillings, crowns and bridges, complete and partial dentures, implants, gum treatment, root canals, and extractions. Preventive care is also provided. Treatment is provided to both adults and children. °Patients are selected via a lottery and there is often a waiting list. °  °[facility] [address], °[place] ° (336) 763-8833 [email or web address] °  °[facility] [address], [place], [place] (336)723-1848, Ext. 123 Second and Fourth Thursday of each month, opens at 6:30 AM; Clinic ends at 9 AM.  Patients are seen on a first-come first-served basis, and a limited number are seen during each clinic.  ° °[facility] ° [address], [place], [place] (336) 723-7904    Eligibility Requirements °You must have lived in [place], [place], or [place] counties for at least the last three months. °  You cannot be eligible for state or federal sponsored healthcare insurance, including Veterans Administration, Medicaid, or Medicare. °  You generally cannot be eligible for healthcare insurance through your employer.  °  How to apply: °Eligibility screenings are held every Tuesday and Wednesday afternoon from 1:00 pm until 4:00 pm. You do not need an appointment for the interview!  °[facility] [address], [place], [place] 336-631-2330   °[facility]  336-342-8273   °[facility]  336-703-3100   °[facility]  336-570-6415   ° °Behavioral Health Resources in the Community: °Intensive Outpatient Programs °Organization         Address  Phone  Notes  °[facility] [address], [place], [place] 336-878-6098   °[facility] [address], [place], [place] 336-832-9800   °[facility] [address], [place], [place] ° 336-882-2125   °[facility] [address],  °[place], [place] 1-800-853-5163 or 336-641-4981   °Substance Abuse Resources °Organization         Address  Phone  Notes  °[facility]  336-882-2125   °[facility]  336-784-9470   °[facility]  336-285-9073   °[facility]  336-845-3988   °Residential & Outpatient Substance Abuse Program  1-800-659-3381   °Psychological Services °Organization         Address  Phone  Notes  °[facility]  336- 832-9600   °[facility]  336- 378-7881   °[facility] [address], [place] 1-800-853-5163 or 336-641-4981   ° °Mobile Crisis Teams °Organization         Address  Phone  Notes  °[facility], Mobile Crisis Care Unit  1-877-626-1772   °Assertive °[facility] ° [address]  [place], [place] 336-834-9664   °Sharon DeEsch [address] °[place] 336-554-5454   ° °Self-Help/Support Groups °Organization         Address  Phone             Notes  °[facility] - variety of support groups  336- 373-1402 Call for more information  °Narcotics Anonymous (NA), [facility] [address], °[place]   2 meetings at this location  ° °  Residential Treatment Programs °Organization         Address  Phone  Notes  °[facility] [address],    °[place]  1-866-801-8205   °[facility] ° [address], [name], [place] 704-293-8524   °[facility] [address], [place] 336-845-3988 Admissions: 8am-3pm M-F  °[facility] [address]    °[place], [place] 336-841-1104   °[facility] [address], [place], [place] 336-379-7146   °[facility] [address]  °[place], [place] 336-285-9073   °[facility] - Intensive Outpatient [address], [place], [place] 336-852-3033   °[facility] [address]  °[place], [place] 1-877-615-2722 or 336-784-9470   °[facility] [address] [place], [place] 336-227-7417 Accepts Medicaid  °[facility] [address]  °[place] 1-800-659-3381 Substance Abuse/Addiction Treatment  ° °[place] Behavioral Health Resources °Organization         Address  Phone  Notes  °[facility]  (888) 581-9988   °Julie Brannon, PhD [address] [place], [place]   (336) 349-5553 or (336) 951-0000   °[facility]   [address] °[place], [place] (336) 349-4454   °[facility] [address], [place], [place] (336) 342-8316 Insurance/Medicaid/sponsorship through [facility]  °[facility] [address]                                    [place], [place] (336) 342-8316 Therapy/tele-psych/case    °[facility] [address]  ° [place], [place] (336) 349-2233    °Dr. Arfeen  (336) 349-4544   °[facility] [place]  [employer] [place] [facility] 1) [address], [place] °2) [address], [place] °3)  [address], [place] (336) 349-3220 °(336) 342-7768 ° °(336) 342-8140   °[place] [facility] (336) 342-1394 or (336) 342-3537 (After Hours)    ° ° °

## 2014-09-08 NOTE — ED Provider Notes (Addendum)
CSN: [id number]     Arrival date & time 09/08/14  0257 History   First MD Initiated Contact with Patient 09/08/14 [phone]     Chief Complaint  Patient presents with  . Chest Pain     HPI Patient reports left-sided sharp chest discomfort which lasted less than a minute.  Transient palpitations and shortness of breath.  This is all completely resolved.  Patient has a known history of atrial fibrillation and presents in A. fib at this time.  He recently relocated to this area and is currently staying at a homeless shelter.  He recently was on anticoagulants for his A. fib but due to her recent GI bleed for which she was hospitalized last month he was taken off his anticoagulants.  He states no pain or discomfort at this time.  No shortness of breath.  No fevers or chills.  Denies productive cough.  No unilateral leg swelling.  Symptoms were mild.  He feels better at this time.   Past Medical History  Diagnosis Date  . A-fib   . Hypertension   . CHF (congestive heart failure)   . COPD (chronic obstructive pulmonary disease)    Past Surgical History  Procedure Laterality Date  . Cardioversion    . Colonoscopy     History reviewed. No pertinent family history. History  Substance Use Topics  . Smoking status: Former Smoker    Quit date: 09/01/2014  . Smokeless tobacco: Not on file  . Alcohol Use: Yes    Review of Systems  All other systems reviewed and are negative.     Allergies  Review of patient's allergies indicates no known allergies.  Home Medications   Prior to Admission medications   Medication Sig Start Date End Date Taking? Authorizing Provider  albuterol (PROVENTIL HFA;VENTOLIN HFA) 108 (90 BASE) MCG/ACT inhaler Inhale 1 puff into the lungs every 6 (six) hours as needed for wheezing or shortness of breath.   Yes Historical Provider, MD  albuterol-ipratropium (COMBIVENT) 18-103 MCG/ACT inhaler Inhale 1 puff into the lungs every 4 (four) hours as needed for wheezing or  shortness of breath.   Yes Historical Provider, MD  aspirin 81 MG chewable tablet Chew 324 mg by mouth daily.   Yes Historical Provider, MD  carvedilol (COREG) 12.5 MG tablet Take 12.5 mg by mouth 2 (two) times daily with a meal.   Yes Historical Provider, MD  digoxin (LANOXIN) 0.125 MG tablet Take 0.125 mg by mouth daily.   Yes Historical Provider, MD  furosemide (LASIX) 20 MG tablet Take 20 mg by mouth daily.   Yes Historical Provider, MD  isosorbide mononitrate (IMDUR) 30 MG 24 hr tablet Take 30 mg by mouth daily.   Yes Historical Provider, MD  lisinopril (PRINIVIL,ZESTRIL) 20 MG tablet Take 20 mg by mouth daily.   Yes Historical Provider, MD  spironolactone (ALDACTONE) 25 MG tablet Take 25 mg by mouth daily.   Yes Historical Provider, MD   BP 108/84 mmHg  Pulse 60  Temp(Src) 97.9 F (36.6 C) (Oral)  Resp 12  Ht 6' (1.829 m)  Wt 181 lb (82.101 kg)  BMI 24.54 kg/m2  SpO2 100% Physical Exam  Constitutional: He is oriented to person, place, and time. He appears well-developed and well-nourished.  HENT:  Head: Normocephalic and atraumatic.  Eyes: EOM are normal.  Neck: Normal range of motion.  Cardiovascular: Normal heart sounds and intact distal pulses.   Irregularly irregular.  Tachycardia.  Pulmonary/Chest: Effort normal and breath sounds normal. No  respiratory distress.  Abdominal: Soft. He exhibits no distension. There is no tenderness.  Musculoskeletal: Normal range of motion.  Neurological: He is alert and oriented to person, place, and time.  Skin: Skin is warm and dry.  Psychiatric: He has a normal mood and affect. Judgment normal.  Nursing note and vitals reviewed.   ED Course  Procedures (including critical care time) Labs Review Labs Reviewed  CBC - Abnormal; Notable for the following:    RBC 3.63 (*)    Hemoglobin 11.3 (*)    HCT 34.4 (*)    All other components within normal limits  BASIC METABOLIC PANEL - Abnormal; Notable for the following:    Glucose, Bld  111 (*)    GFR calc non Af Amer 84 (*)    All other components within normal limits  PRO B NATRIURETIC PEPTIDE - Abnormal; Notable for the following:    Pro B Natriuretic peptide (BNP) 3134.0 (*)    All other components within normal limits  Randolm Idol, ED    Imaging Review Dg Chest 2 View  09/08/2014   CLINICAL DATA:  Chest pain. History of CHF and COPD. Former smoker.  EXAM: CHEST  2 VIEW  COMPARISON:  None.  FINDINGS: Borderline heart size with normal pulmonary vascularity. Emphysematous changes in the lungs with scattered interstitial fibrosis consistent with emphysema. No blunting of costophrenic angles. No pneumothorax. No focal airspace disease. Mild degenerative changes in the spine. Degenerative changes in the right acromioclavicular joint with suggestion of calcific tendinosis versus old displaced fracture fragment inferior to the clavicle.  IMPRESSION: Emphysematous changes and scattered fibrosis in the lungs. No evidence of active pulmonary disease.   Electronically Signed   By: Lucienne Capers M.D.   On: 09/08/2014 03:51     EKG Interpretation   Date/Time:  Friday September 08 2014 03:08:18 EST Ventricular Rate:  112 PR Interval:    QRS Duration: 90 QT Interval:  328 QTC Calculation: 448 R Axis:   72 Text Interpretation:  Atrial fibrillation Ventricular premature complex  LVH with secondary repolarization abnormality No old tracing to compare  Confirmed by Anubis Fundora  MD, Lennette Bihari ([id number]) on 09/08/2014 6:50:08 AM      MDM   Final diagnoses:  Chest pain    Patient was observed in the emergency department.  He's had no recurrence of his chest pain.  This was transient.  EKG is without ischemic changes.  Patient is compliant with his other medications and has an adequate amount.  Referral to her primary care physician in the community was given.  Patient understands to return to the ER for new or worsening symptoms.  Heart rate was ranging in the 107-111 range and  therefore a single dose of IV metoprolol was given.  His heart rate is now in the 90s.  He has chronic atrial fibrillation    Hoy Morn, MD 09/08/14 [place], MD 09/08/14 [phone]

## 2014-09-08 NOTE — ED Notes (Signed)
Pt c/o left sided chest pain PTA; denies pain at this time; pain associated with shortness of breath and "fluttering in chest." Pt recently moved to [facility] and is staying in a homeless shelter. Pt also reports not being able to get his home medications since he's moved here. Requesting help setting up a PCP in [place]. Pt was taking a blood thinner for Afib; was taken off of medication on 09/01/14 when he was admitted at [facility] in [place].

## 2014-09-08 NOTE — ED Notes (Signed)
Pt to ED c/o chest pain that woke him from sleep around midnight. Recently moved to [place] from [place]. Was seen at [facility] on 09/01/14 for uncontrolled Afib; told to stop taking his blood thinners for concerns regarding a GI bleed(had blood in his stools at that time).Pt reports getting up to use the restroom tonight and had sharp shooting pains in L side of chest, non radiating, associated with shortness of breath. EMS VS: 116/84, Afib 100-120s. 324mg  ASA given enroute. Denies chest pain at this time

## 2014-09-08 NOTE — ED Notes (Signed)
Pt given bus pass and resource guide.

## 2014-09-12 ENCOUNTER — Encounter: Payer: Self-pay | Admitting: Family Medicine

## 2014-09-12 ENCOUNTER — Ambulatory Visit (INDEPENDENT_AMBULATORY_CARE_PROVIDER_SITE_OTHER): Payer: Medicare Other | Admitting: Family Medicine

## 2014-09-12 VITALS — BP 96/70 | HR 60 | Temp 98.0°F | Ht 70.0 in | Wt 193.0 lb

## 2014-09-12 DIAGNOSIS — R1900 Intra-abdominal and pelvic swelling, mass and lump, unspecified site: Secondary | ICD-10-CM | POA: Diagnosis not present

## 2014-09-12 DIAGNOSIS — J42 Unspecified chronic bronchitis: Secondary | ICD-10-CM | POA: Diagnosis not present

## 2014-09-12 DIAGNOSIS — I5020 Unspecified systolic (congestive) heart failure: Secondary | ICD-10-CM

## 2014-09-12 DIAGNOSIS — I4891 Unspecified atrial fibrillation: Secondary | ICD-10-CM | POA: Diagnosis not present

## 2014-09-12 DIAGNOSIS — I509 Heart failure, unspecified: Secondary | ICD-10-CM | POA: Diagnosis not present

## 2014-09-12 DIAGNOSIS — J449 Chronic obstructive pulmonary disease, unspecified: Secondary | ICD-10-CM | POA: Insufficient documentation

## 2014-09-12 MED ORDER — IPRATROPIUM-ALBUTEROL 18-103 MCG/ACT IN AERO
1.0000 | INHALATION_SPRAY | RESPIRATORY_TRACT | Status: DC | PRN
Start: 2014-09-12 — End: 2015-08-18

## 2014-09-12 MED ORDER — DIGOXIN 125 MCG PO TABS: 0.1250 mg | ORAL_TABLET | Freq: Every day | ORAL | Status: DC

## 2014-09-12 MED ORDER — ISOSORBIDE MONONITRATE ER 30 MG PO TB24: 30.0000 mg | ORAL_TABLET | Freq: Every day | ORAL | Status: DC

## 2014-09-12 MED ORDER — MOMETASONE FURO-FORMOTEROL FUM 200-5 MCG/ACT IN AERO: 2.0000 | INHALATION_SPRAY | Freq: Two times a day (BID) | RESPIRATORY_TRACT | Status: DC

## 2014-09-12 MED ORDER — ASPIRIN 81 MG PO CHEW: 324.0000 mg | CHEWABLE_TABLET | Freq: Every day | ORAL | Status: DC

## 2014-09-12 NOTE — Patient Instructions (Signed)
It was great seeing you today.   1. You have a virus. Continuing take your medications as prescribed 2. Please go to Emergency department if you get worsening shortness of breath, chest pain or worsening symptoms 3. Start Dulera inhaler 2 puffs; twice a day   Please bring all your medications to every doctors visit  Sign up for My Chart to have easy access to your labs results, and communication with your Primary care physician.  Next Appointment  Please make an appointment with Dr Berkley Harvey in 2 weeks   Make appointment with Dr Valentina Lucks for PFT and smoking cessation  I look forward to talking with you again at our next visit. If you have any questions or concerns before then, please call the clinic at [phone].  Take Care,   Dr [name]

## 2014-09-12 NOTE — Assessment & Plan Note (Signed)
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

## 2014-09-12 NOTE — Progress Notes (Signed)
  Patient name: Paul Clayton MRN [id number]  Date of birth: 1946-04-16  CC & HPI:  Paul Clayton is a 68 y.o. male presenting today to establish care. 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   Today he complains of fatigue and SOB with exertion that is unchanged from his baseline. He has been using his combivent multiple times a day without a spacer with minimal improvement. He denies increase sputum production or purulence. Denies fevers, chills, CP or myalgias.   ROS: See HPI   Medical Hx:  Reviewed Medications & Allergies: Reviewed  Social History: Reviewed:   Objective Findings:  Vitals: BP 96/70 mmHg  Pulse 60  Temp(Src) 98 F (36.7 C) (Oral)  Ht 5\' 10"  (1.778 m)  Wt 193 lb (87.544 kg)  BMI 27.69 kg/m2  SpO2 99%  Gen: NAD CV: irregularly irregular rhythm w/ normal rate; No m/r/g, TP pulses +2 b/l Resp: Decreased breath sounds b/l with mild exp wheeze; normal respiratory effort Lower Ext: No skin changes; No edema; Calves nontender  Assessment & Plan:   Please See Problem Focused Assessment & Plan

## 2014-09-12 NOTE — Assessment & Plan Note (Signed)
Reports using Combivent multiple times a day w/o improvement - No current worsening SOB or sputum production - Rx given for Dulera 2 puffs BID; Given sample from clinic today - Ask to make apt with Dr Valentina Lucks for PFTs and smoking cessation

## 2014-09-12 NOTE — Assessment & Plan Note (Signed)
He reports a mass in his colon found at last hospitalization, and having his blood thinner stopped due to this. Unable to provide additional info - requested records for OSH

## 2014-09-12 NOTE — Assessment & Plan Note (Signed)
Currently in Afib. Rate controlled with Coreg - Awaiting records from outside hospital before discussing anticoagulation - Referred to Heart Failure Clinic

## 2014-09-13 ENCOUNTER — Emergency Department (HOSPITAL_COMMUNITY): Payer: Medicare Other

## 2014-09-13 ENCOUNTER — Emergency Department (HOSPITAL_COMMUNITY)
Admission: EM | Admit: 2014-09-13 | Discharge: 2014-09-13 | Disposition: A | Discharge status: Home / Self Care | Payer: Medicare Other | Attending: Emergency Medicine | Admitting: Emergency Medicine

## 2014-09-13 ENCOUNTER — Encounter (HOSPITAL_COMMUNITY): Payer: Self-pay | Admitting: Emergency Medicine

## 2014-09-13 ENCOUNTER — Other Ambulatory Visit: Payer: Self-pay

## 2014-09-13 DIAGNOSIS — I10 Essential (primary) hypertension: Secondary | ICD-10-CM | POA: Insufficient documentation

## 2014-09-13 DIAGNOSIS — Z792 Long term (current) use of antibiotics: Secondary | ICD-10-CM | POA: Insufficient documentation

## 2014-09-13 DIAGNOSIS — Z79899 Other long term (current) drug therapy: Secondary | ICD-10-CM | POA: Diagnosis not present

## 2014-09-13 DIAGNOSIS — R10817 Generalized abdominal tenderness: Secondary | ICD-10-CM | POA: Diagnosis not present

## 2014-09-13 DIAGNOSIS — J441 Chronic obstructive pulmonary disease with (acute) exacerbation: Secondary | ICD-10-CM | POA: Insufficient documentation

## 2014-09-13 DIAGNOSIS — Z7952 Long term (current) use of systemic steroids: Secondary | ICD-10-CM | POA: Diagnosis not present

## 2014-09-13 DIAGNOSIS — J449 Chronic obstructive pulmonary disease, unspecified: Secondary | ICD-10-CM | POA: Diagnosis not present

## 2014-09-13 DIAGNOSIS — I509 Heart failure, unspecified: Secondary | ICD-10-CM | POA: Insufficient documentation

## 2014-09-13 DIAGNOSIS — Z7982 Long term (current) use of aspirin: Secondary | ICD-10-CM | POA: Diagnosis not present

## 2014-09-13 DIAGNOSIS — R109 Unspecified abdominal pain: Secondary | ICD-10-CM | POA: Diagnosis not present

## 2014-09-13 DIAGNOSIS — Z87891 Personal history of nicotine dependence: Secondary | ICD-10-CM | POA: Diagnosis not present

## 2014-09-13 DIAGNOSIS — Z9889 Other specified postprocedural states: Secondary | ICD-10-CM | POA: Insufficient documentation

## 2014-09-13 DIAGNOSIS — R079 Chest pain, unspecified: Secondary | ICD-10-CM | POA: Diagnosis not present

## 2014-09-13 DIAGNOSIS — I517 Cardiomegaly: Secondary | ICD-10-CM | POA: Diagnosis not present

## 2014-09-13 DIAGNOSIS — R0602 Shortness of breath: Secondary | ICD-10-CM | POA: Diagnosis not present

## 2014-09-13 LAB — BASIC METABOLIC PANEL
Anion gap: 9 (ref 5–15)
BUN: 14 mg/dL (ref 6–23)
CHLORIDE: 106 meq/L (ref 96–112)
CO2: 26 mEq/L (ref 19–32)
Calcium: 8.8 mg/dL (ref 8.4–10.5)
Creatinine, Ser: 0.93 mg/dL (ref 0.50–1.35)
GFR calc non Af Amer: 85 mL/min — ABNORMAL LOW (ref >90)
Glucose, Bld: 88 mg/dL (ref 70–99)
POTASSIUM: 4.6 meq/L (ref 3.7–5.3)
Sodium: 141 mEq/L (ref 137–147)

## 2014-09-13 LAB — PRO B NATRIURETIC PEPTIDE: Pro B Natriuretic peptide (BNP): 3464 pg/mL — ABNORMAL HIGH (ref 0–125)

## 2014-09-13 LAB — CBC
HCT: 33.7 % — ABNORMAL LOW (ref 39.0–52.0)
HEMOGLOBIN: 10.8 g/dL — AB (ref 13.0–17.0)
MCH: 30.3 pg (ref 26.0–34.0)
MCHC: 32 g/dL (ref 30.0–36.0)
MCV: 94.4 fL (ref 78.0–100.0)
Platelets: 182 10*3/uL (ref 150–400)
RBC: 3.57 MIL/uL — AB (ref 4.22–5.81)
RDW: 12.8 % (ref 11.5–15.5)
WBC: 10.5 10*3/uL (ref 4.0–10.5)

## 2014-09-13 LAB — I-STAT TROPONIN, ED
TROPONIN I, POC: 0.01 ng/mL (ref 0.00–0.08)
Troponin i, poc: 0.03 ng/mL (ref 0.00–0.08)
Troponin i, poc: 0 ng/mL (ref 0.00–0.08)

## 2014-09-13 MED ORDER — PREDNISONE 50 MG PO TABS: 50.0000 mg | ORAL_TABLET | Freq: Every day | ORAL | Status: DC

## 2014-09-13 MED ORDER — AZITHROMYCIN 250 MG PO TABS: ORAL_TABLET | ORAL | Status: DC

## 2014-09-13 NOTE — ED Provider Notes (Signed)
CSN: [id number]     Arrival date & time 09/13/14  5916 History   First MD Initiated Contact with Patient 09/13/14 [phone]     Chief Complaint  Patient presents with  . Chest Pain     (Consider location/radiation/quality/duration/timing/severity/associated sxs/prior Treatment) Patient is a 68 y.o. male presenting with chest pain.  Chest Pain Pain location:  L chest Pain quality: sharp   Pain radiates to:  Does not radiate Pain radiates to the back: no   Pain severity:  Moderate Onset quality:  Sudden Duration: <1 min. Timing:  Constant Progression:  Resolved Chronicity:  Recurrent Context comment:  Spontaneous at rest Relieved by:  Nothing Worsened by:  Nothing tried Ineffective treatments:  None tried Associated symptoms: abdominal pain (chronically) and cough (x 1 week)   Associated symptoms: no fever, no nausea, no numbness and not vomiting     Past Medical History  Diagnosis Date  . A-fib   . Hypertension   . CHF (congestive heart failure)   . COPD (chronic obstructive pulmonary disease)    Past Surgical History  Procedure Laterality Date  . Cardioversion    . Colonoscopy     No family history on file. History  Substance Use Topics  . Smoking status: Former Smoker    Quit date: 09/01/2014  . Smokeless tobacco: Not on file  . Alcohol Use: Yes    Review of Systems  Constitutional: Negative for fever.  Respiratory: Positive for cough (x 1 week).   Cardiovascular: Positive for chest pain.  Gastrointestinal: Positive for abdominal pain (chronically). Negative for nausea and vomiting.  Neurological: Negative for numbness.  All other systems reviewed and are negative.     Allergies  Review of patient's allergies indicates no known allergies.  Home Medications   Prior to Admission medications   Medication Sig Start Date End Date Taking? Authorizing Provider  albuterol-ipratropium (COMBIVENT) 18-103 MCG/ACT inhaler Inhale 1 puff into the lungs every 4  (four) hours as needed for wheezing or shortness of breath. 09/12/14  Yes Olam Idler, MD  aspirin 81 MG chewable tablet Chew 4 tablets (324 mg total) by mouth daily. 09/12/14  Yes Olam Idler, MD  carvedilol (COREG) 12.5 MG tablet Take 12.5 mg by mouth 2 (two) times daily with a meal.   Yes Historical Provider, MD  digoxin (LANOXIN) 0.125 MG tablet Take 1 tablet (0.125 mg total) by mouth daily. 09/12/14  Yes Olam Idler, MD  furosemide (LASIX) 20 MG tablet Take 20 mg by mouth daily.   Yes Historical Provider, MD  isosorbide mononitrate (IMDUR) 30 MG 24 hr tablet Take 1 tablet (30 mg total) by mouth daily. 09/12/14  Yes Olam Idler, MD  lisinopril (PRINIVIL,ZESTRIL) 20 MG tablet Take 20 mg by mouth daily.   Yes Historical Provider, MD  mometasone-formoterol (DULERA) 200-5 MCG/ACT AERO Inhale 2 puffs into the lungs 2 (two) times daily. 09/12/14  Yes Olam Idler, MD  spironolactone (ALDACTONE) 25 MG tablet Take 25 mg by mouth daily.   Yes Historical Provider, MD  azithromycin (ZITHROMAX Z-PAK) 250 MG tablet 2 po day one, then 1 daily x 4 days 09/13/14   Debby Freiberg, MD  predniSONE (DELTASONE) 50 MG tablet Take 1 tablet (50 mg total) by mouth daily. 09/13/14   Debby Freiberg, MD   BP 96/75 mmHg  Pulse 69  Temp(Src) 98.9 F (37.2 C) (Oral)  Resp 25  SpO2 96% Physical Exam  Constitutional: He is oriented to person, place, and time. He  appears well-developed and well-nourished.  HENT:  Head: Normocephalic and atraumatic.  Eyes: Conjunctivae and EOM are normal.  Neck: Normal range of motion. Neck supple.  Cardiovascular: Normal rate.  An irregularly irregular rhythm present.  Murmur heard.  Systolic murmur is present with a grade of 3/6  Pulmonary/Chest: Effort normal and breath sounds normal. No respiratory distress.  Abdominal: He exhibits no distension. There is generalized tenderness. There is no rebound and no guarding.  Musculoskeletal: Normal range of motion.   Neurological: He is alert and oriented to person, place, and time.  Skin: Skin is warm and dry.  Vitals reviewed.   ED Course  Procedures (including critical care time) Labs Review Labs Reviewed  PRO B NATRIURETIC PEPTIDE - Abnormal; Notable for the following:    Pro B Natriuretic peptide (BNP) 3464.0 (*)    All other components within normal limits  BASIC METABOLIC PANEL - Abnormal; Notable for the following:    GFR calc non Af Amer 85 (*)    All other components within normal limits  CBC - Abnormal; Notable for the following:    RBC 3.57 (*)    Hemoglobin 10.8 (*)    HCT 33.7 (*)    All other components within normal limits  I-STAT TROPOININ, ED  I-STAT TROPOININ, ED  Randolm Idol, ED    Imaging Review Dg Chest 2 View  09/13/2014   CLINICAL DATA:  Acute onset left-sided chest pain for 24 hr. Productive cough. Short of breath. COPD.  EXAM: CHEST  2 VIEW  COMPARISON:  09/08/2014.  FINDINGS: Emphysema and cardiomegaly are present. There is no airspace consolidation. Diffuse prominence of the interstitium is probably related to emphysema and pulmonary fibrosis. An element of interstitial pulmonary edema cannot be excluded.  Chronic bronchitic changes are present. There is no focal consolidation. No pleural effusion.  IMPRESSION: 1. Unchanged cardiomegaly. 2. Diffuse interstitial prominence is favored to represent pulmonary fibrosis. Interstitial pulmonary edema could produce a similar appearance however the stability compared to the prior exam makes fibrosis more likely. 3. Chronic bronchitic changes and emphysema.   Electronically Signed   By: Dereck Ligas M.D.   On: 09/13/2014 09:49   Dg Abd 2 Views  09/13/2014   CLINICAL DATA:  Abdominal soreness for 2 days followup, recent colonoscopy  EXAM: ABDOMEN - 2 VIEW  COMPARISON:  None.  FINDINGS: Scattered large and small bowel gas is noted. No abnormal mass or abnormal calcifications are seen. A small amount of retained fecal  material is seen. No free air is noted. No acute bony abnormality is seen.  IMPRESSION: Nonspecific abdomen.   Electronically Signed   By: Inez Catalina M.D.   On: 09/13/2014 08:50     EKG Interpretation   Date/Time:  Wednesday September 13 2014 08:17:25 EST Ventricular Rate:  112 PR Interval:    QRS Duration: 88 QT Interval:  307 QTC Calculation: 419 R Axis:   73 Text Interpretation:  Atrial fibrillation LVH with secondary  repolarization abnormality No significant change since last tracing  Confirmed by Debby Freiberg [phone]) on 09/13/2014 9:02:10 AM      MDM   Final diagnoses:  Chest pain  COPD exacerbation    67 y.o. male with pertinent PMH of chronic afib, CHF (EF 15% due to drug abuse), COPD presents with chest pain as described above.  Pain very brief.  Pts persistent complaint is cough x 1 week.  No fevers, nausea, vomiting.  He has chronic dyspnea and cough.  Physical exam today  as above. Pt states his abd is always sore.  EKG as above.  HR normal on my examination.  Labs and imaging obtained as above.  Negative delta troponin.  CXR without acute consolidative process.  History and exam indicate that acs is unlikely, especially given negative delta troponin.  More likely COPD exacerbation.  Pt has cardiology fu in less than 1 week.  DC home in stable condition with strict return precautions.  DC home with azithromycin and prednisone.   1. COPD exacerbation   2. Chest pain         Debby Freiberg, MD 09/13/14 [phone]

## 2014-09-13 NOTE — ED Notes (Signed)
68 yo male c/o Acute onset of chest pain this morning while in the shower. Left sided non rad but sharp. Pain has completey resolved. Reports have a cough all night long, productive with white and red tinged mucous. [facility] 324 ASA   12 Lead A.FIB 122/72 Hr 110-130

## 2014-09-13 NOTE — ED Notes (Signed)
Pt not in room.

## 2014-09-13 NOTE — ED Notes (Signed)
MD at bedside. 

## 2014-09-13 NOTE — Discharge Instructions (Signed)
Chest Pain (Nonspecific) °It is often hard to give a specific diagnosis for the cause of chest pain. There is always a chance that your pain could be related to something serious, such as a heart attack or a blood clot in the lungs. You need to follow up with your health care provider for further evaluation. °CAUSES  °· Heartburn. °· Pneumonia or bronchitis. °· Anxiety or stress. °· Inflammation around your heart (pericarditis) or lung (pleuritis or pleurisy). °· A blood clot in the lung. °· A collapsed lung (pneumothorax). It can develop suddenly on its own (spontaneous pneumothorax) or from trauma to the chest. °· Shingles infection (herpes zoster virus). °The chest wall is composed of bones, muscles, and cartilage. Any of these can be the source of the pain. °· The bones can be bruised by injury. °· The muscles or cartilage can be strained by coughing or overwork. °· The cartilage can be affected by inflammation and become sore (costochondritis). °DIAGNOSIS  °Lab tests or other studies may be needed to find the cause of your pain. Your health care provider may have you take a test called an ambulatory electrocardiogram (ECG). An ECG records your heartbeat patterns over a 24-hour period. You may also have other tests, such as: °· Transthoracic echocardiogram (TTE). During echocardiography, sound waves are used to evaluate how blood flows through your heart. °· Transesophageal echocardiogram (TEE). °· Cardiac monitoring. This allows your health care provider to monitor your heart rate and rhythm in real time. °· Holter monitor. This is a portable device that records your heartbeat and can help diagnose heart arrhythmias. It allows your health care provider to track your heart activity for several days, if needed. °· Stress tests by exercise or by giving medicine that makes the heart beat faster. °TREATMENT  °· Treatment depends on what may be causing your chest pain. Treatment may include: °· Acid blockers for  heartburn. °· Anti-inflammatory medicine. °· Pain medicine for inflammatory conditions. °· Antibiotics if an infection is present. °· You may be advised to change lifestyle habits. This includes stopping smoking and avoiding alcohol, caffeine, and chocolate. °· You may be advised to keep your head raised (elevated) when sleeping. This reduces the chance of acid going backward from your stomach into your esophagus. °Most of the time, nonspecific chest pain will improve within 2-3 days with rest and mild pain medicine.  °HOME CARE INSTRUCTIONS  °· If antibiotics were prescribed, take them as directed. Finish them even if you start to feel better. °· For the next few days, avoid physical activities that bring on chest pain. Continue physical activities as directed. °· Do not use any tobacco products, including cigarettes, chewing tobacco, or electronic cigarettes. °· Avoid drinking alcohol. °· Only take medicine as directed by your health care provider. °· Follow your health care provider's suggestions for further testing if your chest pain does not go away. °· Keep any follow-up appointments you made. If you do not go to an appointment, you could develop lasting (chronic) problems with pain. If there is any problem keeping an appointment, call to reschedule. °SEEK MEDICAL CARE IF:  °1. Your chest pain does not go away, even after treatment. °2. You have a rash with blisters on your chest. °3. You have a fever. °SEEK IMMEDIATE MEDICAL CARE IF:  °· You have increased chest pain or pain that spreads to your arm, neck, jaw, back, or abdomen. °· You have shortness of breath. °· You have an increasing cough, or you cough   up blood.  You have severe back or abdominal pain.  You feel nauseous or vomit.  You have severe weakness.  You faint.  You have chills. This is an emergency. Do not wait to see if the pain will go away. Get medical help at once. Call your local emergency services (911 in [place] Do not drive  yourself to the hospital. MAKE SURE YOU:   Understand these instructions.  Will watch your condition.  Will get help right away if you are not doing well or get worse. Document Released: 07/16/2005 Document Revised: 10/11/2013 Document Reviewed: 05/11/2008 [facility] Patient Information 2015 [place], [place]. This information is not intended to replace advice given to you by your health care provider. Make sure you discuss any questions you have with your health care provider. Chronic Obstructive Pulmonary Disease Chronic obstructive pulmonary disease (COPD) is a common lung condition in which airflow from the lungs is limited. COPD is a general term that can be used to describe many different lung problems that limit airflow, including both chronic bronchitis and emphysema. If you have COPD, your lung function will probably never return to normal, but there are measures you can take to improve lung function and make yourself feel better.  CAUSES   Smoking (common).   Exposure to secondhand smoke.   Genetic problems.  Chronic inflammatory lung diseases or recurrent infections. SYMPTOMS   Shortness of breath, especially with physical activity.   Deep, persistent (chronic) cough with a large amount of thick mucus.   Wheezing.   Rapid breaths (tachypnea).   Gray or bluish discoloration (cyanosis) of the skin, especially in fingers, toes, or lips.   Fatigue.   Weight loss.   Frequent infections or episodes when breathing symptoms become much worse (exacerbations).   Chest tightness. DIAGNOSIS  Your health care provider will take a medical history and perform a physical examination to make the initial diagnosis. Additional tests for COPD may include:   Lung (pulmonary) function tests.  Chest X-ray.  CT scan.  Blood tests. TREATMENT  Treatment available to help you feel better when you have COPD includes:   Inhaler and nebulizer medicines. These help manage the  symptoms of COPD and make your breathing more comfortable.  Supplemental oxygen. Supplemental oxygen is only helpful if you have a low oxygen level in your blood.   Exercise and physical activity. These are beneficial for nearly all people with COPD. Some people may also benefit from a pulmonary rehabilitation program. HOME CARE INSTRUCTIONS   Take all medicines (inhaled or pills) as directed by your health care provider.  Avoid over-the-counter medicines or cough syrups that dry up your airway (such as antihistamines) and slow down the elimination of secretions unless instructed otherwise by your health care provider.   If you are a smoker, the most important thing that you can do is stop smoking. Continuing to smoke will cause further lung damage and breathing trouble. Ask your health care provider for help with quitting smoking. He or she can direct you to community resources or hospitals that provide support.  Avoid exposure to irritants such as smoke, chemicals, and fumes that aggravate your breathing.  Use oxygen therapy and pulmonary rehabilitation if directed by your health care provider. If you require home oxygen therapy, ask your health care provider whether you should purchase a pulse oximeter to measure your oxygen level at home.   Avoid contact with individuals who have a contagious illness.  Avoid extreme temperature and humidity changes.  Eat  Eat healthy foods. Eating smaller, more frequent meals and resting before meals may help you maintain your strength. °· Stay active, but balance activity with periods of rest. Exercise and physical activity will help you maintain your ability to do things you want to do. °· Preventing infection and hospitalization is very important when you have COPD. Make sure to receive all the vaccines your health care provider recommends, especially the pneumococcal and influenza vaccines. Ask your health care provider whether you need a pneumonia  vaccine. °· Learn and use relaxation techniques to manage stress. °· Learn and use controlled breathing techniques as directed by your health care provider. Controlled breathing techniques include:   °¨ Pursed lip breathing. Start by breathing in (inhaling) through your nose for 1 second. Then, purse your lips as if you were going to whistle and breathe out (exhale) through the pursed lips for 2 seconds.   °¨ Diaphragmatic breathing. Start by putting one hand on your abdomen just above your waist. Inhale slowly through your nose. The hand on your abdomen should move out. Then purse your lips and exhale slowly. You should be able to feel the hand on your abdomen moving in as you exhale.   °· Learn and use controlled coughing to clear mucus from your lungs. Controlled coughing is a series of short, progressive coughs. The steps of controlled coughing are:   °4. Lean your head slightly forward.   °5. Breathe in deeply using diaphragmatic breathing.   °6. Try to hold your breath for 3 seconds.   °7. Keep your mouth slightly open while coughing twice.   °8. Spit any mucus out into a tissue.   °9. Rest and repeat the steps once or twice as needed. °SEEK MEDICAL CARE IF:  °· You are coughing up more mucus than usual.   °· There is a change in the color or thickness of your mucus.   °· Your breathing is more labored than usual.   °· Your breathing is faster than usual.   °SEEK IMMEDIATE MEDICAL CARE IF:  °· You have shortness of breath while you are resting.   °· You have shortness of breath that prevents you from: °¨ Being able to talk.   °¨ Performing your usual physical activities.   °· You have chest pain lasting longer than 5 minutes.   °· Your skin color is more cyanotic than usual. °· You measure low oxygen saturations for longer than 5 minutes with a pulse oximeter. °MAKE SURE YOU:  °· Understand these instructions. °· Will watch your condition. °· Will get help right away if you are not doing well or get  worse. °Document Released: 07/16/2005 Document Revised: 02/20/2014 Document Reviewed: 06/02/2013 °[employer]® Patient Information ©2015 [employer]. This information is not intended to replace advice given to you by your health care provider. Make sure you discuss any questions you have with your health care provider. ° °

## 2014-09-16 ENCOUNTER — Observation Stay (HOSPITAL_COMMUNITY)
Admission: EM | Admit: 2014-09-16 | Discharge: 2014-09-18 | Disposition: A | Discharge status: Home / Self Care | Payer: Medicare Other | Attending: Family Medicine | Admitting: Family Medicine

## 2014-09-16 ENCOUNTER — Emergency Department (HOSPITAL_COMMUNITY): Payer: Medicare Other

## 2014-09-16 ENCOUNTER — Encounter (HOSPITAL_COMMUNITY): Payer: Self-pay

## 2014-09-16 DIAGNOSIS — Z590 Homelessness: Secondary | ICD-10-CM | POA: Diagnosis not present

## 2014-09-16 DIAGNOSIS — J441 Chronic obstructive pulmonary disease with (acute) exacerbation: Secondary | ICD-10-CM | POA: Diagnosis not present

## 2014-09-16 DIAGNOSIS — F1410 Cocaine abuse, uncomplicated: Secondary | ICD-10-CM | POA: Insufficient documentation

## 2014-09-16 DIAGNOSIS — R1900 Intra-abdominal and pelvic swelling, mass and lump, unspecified site: Secondary | ICD-10-CM | POA: Diagnosis present

## 2014-09-16 DIAGNOSIS — I509 Heart failure, unspecified: Secondary | ICD-10-CM | POA: Diagnosis not present

## 2014-09-16 DIAGNOSIS — J449 Chronic obstructive pulmonary disease, unspecified: Secondary | ICD-10-CM | POA: Diagnosis not present

## 2014-09-16 DIAGNOSIS — Z7982 Long term (current) use of aspirin: Secondary | ICD-10-CM | POA: Insufficient documentation

## 2014-09-16 DIAGNOSIS — R0789 Other chest pain: Secondary | ICD-10-CM | POA: Diagnosis present

## 2014-09-16 DIAGNOSIS — R55 Syncope and collapse: Secondary | ICD-10-CM | POA: Diagnosis present

## 2014-09-16 DIAGNOSIS — R079 Chest pain, unspecified: Secondary | ICD-10-CM | POA: Diagnosis not present

## 2014-09-16 DIAGNOSIS — Z87891 Personal history of nicotine dependence: Secondary | ICD-10-CM | POA: Insufficient documentation

## 2014-09-16 DIAGNOSIS — C189 Malignant neoplasm of colon, unspecified: Secondary | ICD-10-CM | POA: Insufficient documentation

## 2014-09-16 DIAGNOSIS — I10 Essential (primary) hypertension: Secondary | ICD-10-CM | POA: Insufficient documentation

## 2014-09-16 DIAGNOSIS — I5023 Acute on chronic systolic (congestive) heart failure: Secondary | ICD-10-CM | POA: Diagnosis not present

## 2014-09-16 DIAGNOSIS — I517 Cardiomegaly: Secondary | ICD-10-CM | POA: Diagnosis not present

## 2014-09-16 DIAGNOSIS — I4891 Unspecified atrial fibrillation: Secondary | ICD-10-CM | POA: Diagnosis present

## 2014-09-16 DIAGNOSIS — I482 Chronic atrial fibrillation: Secondary | ICD-10-CM

## 2014-09-16 DIAGNOSIS — I059 Rheumatic mitral valve disease, unspecified: Secondary | ICD-10-CM | POA: Diagnosis not present

## 2014-09-16 HISTORY — DX: Tobacco use: Z72.0

## 2014-09-16 HISTORY — DX: Unspecified viral hepatitis C without hepatic coma: B19.20

## 2014-09-16 LAB — RAPID URINE DRUG SCREEN, HOSP PERFORMED
Amphetamines: NOT DETECTED
BENZODIAZEPINES: NOT DETECTED
Barbiturates: NOT DETECTED
Cocaine: NOT DETECTED
Opiates: NOT DETECTED
Tetrahydrocannabinol: NOT DETECTED

## 2014-09-16 LAB — PRO B NATRIURETIC PEPTIDE: Pro B Natriuretic peptide (BNP): 2919 pg/mL — ABNORMAL HIGH (ref 0–125)

## 2014-09-16 LAB — CBC WITH DIFFERENTIAL/PLATELET
Basophils Absolute: 0 10*3/uL (ref 0.0–0.1)
Basophils Relative: 0 % (ref 0–1)
Eosinophils Absolute: 0 10*3/uL (ref 0.0–0.7)
Eosinophils Relative: 0 % (ref 0–5)
HEMATOCRIT: 35.1 % — AB (ref 39.0–52.0)
Hemoglobin: 11.3 g/dL — ABNORMAL LOW (ref 13.0–17.0)
LYMPHS ABS: 2.2 10*3/uL (ref 0.7–4.0)
Lymphocytes Relative: 19 % (ref 12–46)
MCH: 30.8 pg (ref 26.0–34.0)
MCHC: 32.2 g/dL (ref 30.0–36.0)
MCV: 95.6 fL (ref 78.0–100.0)
Monocytes Absolute: 1.4 10*3/uL — ABNORMAL HIGH (ref 0.1–1.0)
Monocytes Relative: 12 % (ref 3–12)
NEUTROS ABS: 8.2 10*3/uL — AB (ref 1.7–7.7)
Neutrophils Relative %: 69 % (ref 43–77)
PLATELETS: 223 10*3/uL (ref 150–400)
RBC: 3.67 MIL/uL — ABNORMAL LOW (ref 4.22–5.81)
RDW: 12.9 % (ref 11.5–15.5)
WBC: 11.8 10*3/uL — ABNORMAL HIGH (ref 4.0–10.5)

## 2014-09-16 LAB — TROPONIN I
Troponin I: <0.3 ng/mL (ref <0.30)
Troponin I: <0.3 ng/mL (ref <0.30)

## 2014-09-16 LAB — COMPREHENSIVE METABOLIC PANEL
ALK PHOS: 54 U/L (ref 39–117)
ALT: 32 U/L (ref 0–53)
AST: 25 U/L (ref 0–37)
Albumin: 2.8 g/dL — ABNORMAL LOW (ref 3.5–5.2)
Anion gap: 11 (ref 5–15)
BILIRUBIN TOTAL: 0.7 mg/dL (ref 0.3–1.2)
BUN: 16 mg/dL (ref 6–23)
CO2: 23 mEq/L (ref 19–32)
CREATININE: 0.8 mg/dL (ref 0.50–1.35)
Calcium: 8.9 mg/dL (ref 8.4–10.5)
Chloride: 108 mEq/L (ref 96–112)
GFR calc non Af Amer: >90 mL/min (ref >90)
GLUCOSE: 107 mg/dL — AB (ref 70–99)
POTASSIUM: 4.8 meq/L (ref 3.7–5.3)
Sodium: 142 mEq/L (ref 137–147)
TOTAL PROTEIN: 6.5 g/dL (ref 6.0–8.3)

## 2014-09-16 LAB — LIPID PANEL
Cholesterol: 137 mg/dL (ref 0–200)
HDL: 43 mg/dL (ref >39)
LDL CALC: 73 mg/dL (ref 0–99)
Total CHOL/HDL Ratio: 3.2 RATIO
Triglycerides: 103 mg/dL (ref <150)
VLDL: 21 mg/dL (ref 0–40)

## 2014-09-16 LAB — TSH: TSH: 1.16 u[IU]/mL (ref 0.350–4.500)

## 2014-09-16 LAB — DIGOXIN LEVEL

## 2014-09-16 LAB — D-DIMER, QUANTITATIVE (NOT AT ARMC): D DIMER QUANT: 0.28 ug{FEU}/mL (ref 0.00–0.48)

## 2014-09-16 LAB — HEMOGLOBIN A1C
Hgb A1c MFr Bld: 5.3 % (ref <5.7)
Mean Plasma Glucose: 105 mg/dL (ref <117)

## 2014-09-16 MED ORDER — ONDANSETRON HCL 4 MG/2ML IJ SOLN: 4.0000 mg | Freq: Four times a day (QID) | INTRAMUSCULAR | Status: DC | PRN

## 2014-09-16 MED ORDER — MOMETASONE FURO-FORMOTEROL FUM 200-5 MCG/ACT IN AERO
2.0000 | INHALATION_SPRAY | Freq: Two times a day (BID) | RESPIRATORY_TRACT | Status: DC
  Administered 2014-09-16 – 2014-09-18 (×5): 2 via RESPIRATORY_TRACT
  Filled 2014-09-16: qty 8.8

## 2014-09-16 MED ORDER — IPRATROPIUM-ALBUTEROL 18-103 MCG/ACT IN AERO: 2.0000 | INHALATION_SPRAY | RESPIRATORY_TRACT | Status: DC | PRN

## 2014-09-16 MED ORDER — HEPARIN SODIUM (PORCINE) 5000 UNIT/ML IJ SOLN
5000.0000 [IU] | Freq: Three times a day (TID) | INTRAMUSCULAR | Status: DC
  Administered 2014-09-16 – 2014-09-18 (×6): 5000 [IU] via SUBCUTANEOUS
  Filled 2014-09-16 (×8): qty 1

## 2014-09-16 MED ORDER — ASPIRIN 81 MG PO CHEW
324.0000 mg | CHEWABLE_TABLET | Freq: Every day | ORAL | Status: DC
  Administered 2014-09-16 – 2014-09-17 (×2): 324 mg via ORAL

## 2014-09-16 MED ORDER — AZITHROMYCIN 250 MG PO TABS
250.0000 mg | ORAL_TABLET | Freq: Every day | ORAL | Status: DC
  Administered 2014-09-16 – 2014-09-18 (×3): 250 mg via ORAL
  Filled 2014-09-16 (×3): qty 1

## 2014-09-16 MED ORDER — GI COCKTAIL ~~LOC~~
30.0000 mL | Freq: Four times a day (QID) | ORAL | Status: DC | PRN
  Administered 2014-09-18: 30 mL via ORAL
  Filled 2014-09-16 (×2): qty 30

## 2014-09-16 MED ORDER — CARVEDILOL 12.5 MG PO TABS
12.5000 mg | ORAL_TABLET | Freq: Two times a day (BID) | ORAL | Status: DC
  Administered 2014-09-16 – 2014-09-18 (×5): 12.5 mg via ORAL
  Filled 2014-09-16 (×7): qty 1

## 2014-09-16 MED ORDER — IPRATROPIUM-ALBUTEROL 0.5-2.5 (3) MG/3ML IN SOLN: 3.0000 mL | RESPIRATORY_TRACT | Status: DC | PRN

## 2014-09-16 MED ORDER — PREDNISONE 50 MG PO TABS
50.0000 mg | ORAL_TABLET | Freq: Every day | ORAL | Status: DC
  Administered 2014-09-16 – 2014-09-18 (×3): 50 mg via ORAL
  Filled 2014-09-16 (×3): qty 1

## 2014-09-16 MED ORDER — ACETAMINOPHEN 325 MG PO TABS: 650.0000 mg | ORAL_TABLET | ORAL | Status: DC | PRN

## 2014-09-16 MED ORDER — SODIUM CHLORIDE 0.9 % IV BOLUS (SEPSIS): 250.0000 mL | Freq: Once | INTRAVENOUS | Status: DC

## 2014-09-16 MED ORDER — LISINOPRIL 20 MG PO TABS
20.0000 mg | ORAL_TABLET | Freq: Every day | ORAL | Status: DC
  Administered 2014-09-16 – 2014-09-18 (×3): 20 mg via ORAL
  Filled 2014-09-16 (×3): qty 1

## 2014-09-16 MED ORDER — FUROSEMIDE 20 MG PO TABS
20.0000 mg | ORAL_TABLET | Freq: Every day | ORAL | Status: DC
  Administered 2014-09-16 – 2014-09-18 (×3): 20 mg via ORAL
  Filled 2014-09-16 (×3): qty 1

## 2014-09-16 MED ORDER — DIGOXIN 250 MCG PO TABS
0.2500 mg | ORAL_TABLET | Freq: Every day | ORAL | Status: DC
  Administered 2014-09-16 – 2014-09-18 (×3): 0.25 mg via ORAL
  Filled 2014-09-16 (×3): qty 1

## 2014-09-16 NOTE — H&P (Signed)
[facility] Admission History and Physical Service Pager: [phone]  Patient name: Paul Clayton Medical record number: [id number] Date of birth: 09-Mar-1946 Age: 68 y.o. Gender: male  Primary Care Provider: [name], MD Consultants: None  Code Status: Full  Chief Complaint: Chest pain and syncope  Assessment and Plan: SHAWNMICHAEL Clayton is a 68 y.o. male presenting with chest pain and syncope . PMH is significant for atrial fibrillation, hypertension, systolic CHF, and COPD.  Chest pain, syncope - Atypical chest pain, unclear if this is syncope but most likely etiology is orthostasis versus vasovagal - Admit to chest pain observation unit to rule out acute coronary syndrome, also evaluate for cardiogenic sources of syncope - EKG this morning with A. fib, LVH, PVCs, ST elevation in V3 and V4 likely due to LVH - Continue aspirin, metoprolol - Cycle troponins - Consult cardiology- appreciate recommendations - Check risk stratification labs including lipid panel, A1c, TSH - TTE  A. Fib, with RVR - A. fib with elevated right tonight to up to 127 - Unable to be more aggressive with metoprolol for rate control due to relative hypotension - Increase digoxin to 0.25 mg - Consider transitioning to amiodarone - States he's not on anticoagulation due to colonic mass  HTN - Relatively soft blood pressures tonight as low as 105/76 - Patient with dizziness all the time since starting his new medicines he states - Decrease lisinopril by half, 40-20 mg - Hold Imdur, hold spironolactone - Continue Lasix and metoprolol  Systolic CHF - BNP elevated, chest x-ray with cardiomegaly and slight pulmonary edema - Continue metoprolol, holding Ace with relatively soft blood pressures, will restart ASAP - Continue aspirin  Colonic mass - Diagnosed Baldo Ash with a colonoscopy, patient unaware of diagnosis. - Needs outpatient follow-up with GI, his PCP as already  addressing this  COPD - Currently being treated for exacerbation with azithromycin and prednisone - Continue steroids and antibiotics - Continue albuterol and Dulera  FEN/GI: Saline lock IV, heart healthy diet Prophylaxis: Subcutaneous heparin  Disposition: Admit to cardiac observation unit for chest pain evaluation and further workup.  History of Present Illness: Paul Clayton is a 68 y.o. male presenting with chest pain and syncope. He states this morning he got up to use the restroom and had 3-4 episodes of momentary sharp left-sided nonradiating chest pain followed by palpitations sensed in his left lower rib/flank. He went to the restroom and was found passed out on the stall wall by another resident. He states that he is homeless and currently staying at a shelter. He notes increased cough and dyspnea lately which has improved with steroids and antibiotics. He endorses orthopnea but has not found relief as he only has 1 pillow. Since he was found he's had a few episodes of intermittent sharp chest pain lasting a few seconds each by does not have any currently.  He states that he was diagnosed with a colonic mass in [place] about 2 months ago. He was diagnosed with A. fib about 3 weeks ago started on several new medications. He states that since he started his new medicines he feels dizzy all the time. He reports good compliance of all medications.  Review Of Systems: Per HPI, Otherwise 12 point review of systems was performed and was unremarkable.  Patient Active Problem List   Diagnosis Date Noted  . HFrEF (heart failure with reduced ejection fraction) 09/12/2014  . A-fib 09/12/2014  . COPD (chronic obstructive pulmonary disease) 09/12/2014  .  Mass in the abdomen 09/12/2014   Past Medical History: Past Medical History  Diagnosis Date  . A-fib   . Hypertension   . CHF (congestive heart failure)   . COPD (chronic obstructive pulmonary disease)    Past Surgical History: Past  Surgical History  Procedure Laterality Date  . Cardioversion    . Colonoscopy     Social History: History  Substance Use Topics  . Smoking status: Former Smoker    Quit date: 09/01/2014  . Smokeless tobacco: Not on file  . Alcohol Use: Yes   Additional social history: Please also refer to relevant sections of EMR.  Family History: History reviewed. No pertinent family history. Allergies and Medications: No Known Allergies No current facility-administered medications on file prior to encounter.   Current Outpatient Prescriptions on File Prior to Encounter  Medication Sig Dispense Refill  . albuterol-ipratropium (COMBIVENT) 18-103 MCG/ACT inhaler Inhale 1 puff into the lungs every 4 (four) hours as needed for wheezing or shortness of breath. 1 Inhaler 1  . aspirin 81 MG chewable tablet Chew 4 tablets (324 mg total) by mouth daily. 300 tablet 1  . azithromycin (ZITHROMAX Z-PAK) 250 MG tablet 2 po day one, then 1 daily x 4 days 5 tablet 0  . carvedilol (COREG) 12.5 MG tablet Take 12.5 mg by mouth 2 (two) times daily with a meal.    . digoxin (LANOXIN) 0.125 MG tablet Take 1 tablet (0.125 mg total) by mouth daily. 32 tablet 1  . furosemide (LASIX) 20 MG tablet Take 20 mg by mouth daily.    . isosorbide mononitrate (IMDUR) 30 MG 24 hr tablet Take 1 tablet (30 mg total) by mouth daily. 32 tablet 1  . lisinopril (PRINIVIL,ZESTRIL) 20 MG tablet Take 20 mg by mouth daily.    . mometasone-formoterol (DULERA) 200-5 MCG/ACT AERO Inhale 2 puffs into the lungs 2 (two) times daily. 1 Inhaler 0  . predniSONE (DELTASONE) 50 MG tablet Take 1 tablet (50 mg total) by mouth daily. 5 tablet 0  . spironolactone (ALDACTONE) 25 MG tablet Take 25 mg by mouth daily.      Objective: BP 106/89 mmHg  Pulse 53  Temp(Src) 98 F (36.7 C) (Oral)  Resp 18  Ht 6' (1.829 m)  Wt 193 lb (87.544 kg)  BMI 26.17 kg/m2  SpO2 97% Exam: Gen: NAD, alert, cooperative with exam HEENT: NCAT, EOMI, PERRL, MMM CV:  Irregularly irregular, no murmur Resp: Nonlabored, good air movement, coarse expiratory breath sounds in the right base, no discrete wheezing or crackles Abd: SNTND, BS present, no guarding or organomegaly Ext: No edema, warm, 2+ DP pulses Neuro: Alert and oriented, strength 5/5 in bilateral upper and lower extremity, EOMI   Labs and Imaging: CBC BMET   Recent Labs Lab 09/16/14 0430  WBC 11.8*  HGB 11.3*  HCT 35.1*  PLT 223    Recent Labs Lab 09/16/14 0430  NA 142  K 4.8  CL 108  CO2 23  BUN 16  CREATININE 0.80  GLUCOSE 107*  CALCIUM 8.9     ProBNP 2919 Troponin negative 1 Digoxin level less than 0.3  Chest x-ray 09/08/2014: IMPRESSION: Stable cardiomegaly, no acute pulmonary process.  Timmothy Euler, MD 09/16/2014, 5:49 AM PGY-3, [place] [facility] pager: [phone], text pages welcome

## 2014-09-16 NOTE — ED Notes (Signed)
Per EMS pt was using the bathroom and passed out for unknown amount of time; Pt states when he came too he felt heart fluttering; Pt c/o pain 1/10 that has moved from left chest down into left abdominal area;

## 2014-09-16 NOTE — ED Notes (Signed)
Patient transported to X-ray 

## 2014-09-16 NOTE — Consult Note (Signed)
CARDIOLOGY CONSULT NOTE   Patient ID: Paul Clayton MRN: [id number] DOB/AGE: 06-12-1946 68 y.o.  Admit Date: 09/16/2014  Primary Physician: Paul Myron, MD  Primary Cardiologist     patient scheduled to be seen in the advanced heart failure clinic   Clinical Summary Paul Clayton is a 68 y.o.male. He has a history of cardiomyopathy. The information comes through his outpatient evaluation done very recently here at [facility]. It is my understanding that outside records are still pending. He has atrial fibrillation. He also has some type of mass in his abdomen. He had some chest discomfort. There is question of a syncopal episode and he was brought to the hospital.   No Known Allergies  Medications Scheduled Medications: . aspirin  324 mg Oral Daily  . azithromycin  250 mg Oral Daily  . carvedilol  12.5 mg Oral BID WC  . digoxin  0.25 mg Oral Daily  . furosemide  20 mg Oral Daily  . heparin  5,000 Units Subcutaneous 3 times per day  . lisinopril  20 mg Oral Daily  . mometasone-formoterol  2 puff Inhalation BID  . predniSONE  50 mg Oral Daily     Infusions:     PRN Medications:  acetaminophen, gi cocktail, ipratropium-albuterol, ondansetron (ZOFRAN) IV   Past Medical History  Diagnosis Date  . A-fib   . Hypertension   . CHF (congestive heart failure)   . COPD (chronic obstructive pulmonary disease)     Past Surgical History  Procedure Laterality Date  . Cardioversion    . Colonoscopy      There is no significant history of congestive heart failure or atrial fibrillation.  Social History Paul Clayton reports that he quit smoking about 2 weeks ago. He does not have any smokeless tobacco history on file. Paul Clayton reports that he drinks alcohol.  Review of Systems At this time the patient denies fever, chills, headache, sweats, rash, change in vision, change in hearing, cough, nausea or vomiting, urinary symptoms. All other systems are reviewed and are  negative.  Physical Examination Blood pressure 113/77, pulse 100, temperature 97.7 F (36.5 C), temperature source Oral, resp. rate 20, height 6' (1.829 m), weight 190 lb 4.1 oz (86.3 kg), SpO2 97 %.  Intake/Output Summary (Last 24 hours) at 09/16/14 1348 Last data filed at 09/16/14 1341  Gross per 24 hour  Intake    240 ml  Output     75 ml  Net    165 ml    Patient is oriented to person time and place. Affect is normal. Head is atraumatic. Sclera and conjunctiva are normal. Lungs reveal scattered rhonchi. There is no jugular venous distention. Cardiac exam reveals S1 and S2. The abdomen is soft. There is no significant peripheral edema. There are no musculoskeletal deformities. There are no skin rashes.  Lab Results  Basic Metabolic Panel:  Recent Labs Lab 09/13/14 0900 09/16/14 0430  NA 141 142  K 4.6 4.8  CL 106 108  CO2 26 23  GLUCOSE 88 107*  BUN 14 16  CREATININE 0.93 0.80  CALCIUM 8.8 8.9    Liver Function Tests:  Recent Labs Lab 09/16/14 0430  AST 25  ALT 32  ALKPHOS 54  BILITOT 0.7  PROT 6.5  ALBUMIN 2.8*    CBC:  Recent Labs Lab 09/13/14 0900 09/16/14 0430  WBC 10.5 11.8*  NEUTROABS  --  8.2*  HGB 10.8* 11.3*  HCT 33.7* 35.1*  MCV 94.4 95.6  PLT 182 223    Cardiac Enzymes:  Recent Labs Lab 09/16/14 0430 09/16/14 1004  TROPONINI <0.30 <0.30    BNP: Invalid input(s): POCBNP   Radiology: Dg Chest 2 View  09/16/2014   CLINICAL DATA:  Syncope while using the bathroom. Heart palpitations. History of hypertension, CHF and COPD.  EXAM: CHEST  2 VIEW  COMPARISON:  Chest radiograph Clayton 25, 2015  FINDINGS: The cardiac silhouette remains similarly enlarged. Mildly calcified aortic knob. Similar mild interstitial prominence with increased lung volumes can be seen with COPD. Increased lung volumes. No pleural effusions. No focal consolidation. No pneumothorax. Soft tissue planes and included osseous structures are nonsuspicious   IMPRESSION: Stable cardiomegaly, no acute pulmonary process.   Electronically Signed   By: Paul Clayton   On: 09/16/2014 05:45     ECG: EKG reveals atrial fibrillation. There are nonspecific ST-T wave changes. There is decreased anterior R-wave progression.  Telemetry: I have reviewed telemetry today Clayton 28, 2015. There is atrial fibrillation with increased rate.   Impression and Recommendations    A-fib   His atrial fibrillation is increased. With a history of severe left ventricular dysfunction, diltiazem is not a good drug to try. He is on a beta blocker. He is on digoxin. It has been mentioned that amiodarone could be considered. This would be a good choice for him. However I'm not sure if he will be able to get this medicine on an ongoing basis. In addition he has to be monitored if he is on amiodarone.    Mass in the abdomen    He will be key to obtain more information about this issue before any other decisions can be made.    Syncope     By report the patient was found leaning against a stall. With his left ventricular dysfunction he is certainly at risk for high grade arrhythmias. Plan to monitor in the hospital to watch his rhythm.    COPD exacerbation    Acute on chronic systolic CHF (congestive heart failure)     At this point the patient's volume status is relatively stable. I would not push for further diuresis at this time.  It seems that our hands are tied without all of the prior information that exists from his evaluation in [place]. I would suggest pushing very hard to get this information.  Paul November, MD  09/16/2014, 1:48 PM

## 2014-09-16 NOTE — Progress Notes (Signed)
Echocardiogram 2D Echocardiogram has been performed.  Paul Clayton 09/16/2014, 2:18 PM

## 2014-09-16 NOTE — ED Notes (Signed)
MD at bedside. 

## 2014-09-16 NOTE — ED Provider Notes (Signed)
CSN: [id number]     Arrival date & time 09/16/14  0409 History   First MD Initiated Contact with Patient 09/16/14 [phone]     Chief Complaint  Patient presents with  . Chest Pain  . Loss of Consciousness     (Consider location/radiation/quality/duration/timing/severity/associated sxs/prior Treatment) Patient is a 68 y.o. male presenting with chest pain and syncope. The history is provided by the patient.  Chest Pain Pain location:  L chest Pain quality: sharp   Pain radiates to:  Does not radiate Pain radiates to the back: no   Pain severity:  Moderate Onset quality:  Sudden Duration: a few seconds. Timing:  Intermittent Progression:  Resolved Chronicity:  Recurrent Context: at rest   Relieved by:  Nothing Worsened by:  Nothing tried Ineffective treatments:  None tried Associated symptoms: syncope   Associated symptoms: no abdominal pain, no cough, no fever, no headache, no nausea, no numbness, no shortness of breath and not vomiting   Syncope:    Witnessed: no     Suspicion of head trauma:  No Loss of Consciousness Associated symptoms: chest pain   Associated symptoms: no fever, no headaches, no nausea, no shortness of breath and no vomiting     Past Medical History  Diagnosis Date  . A-fib   . Hypertension   . CHF (congestive heart failure)   . COPD (chronic obstructive pulmonary disease)    Past Surgical History  Procedure Laterality Date  . Cardioversion    . Colonoscopy     History reviewed. No pertinent family history. History  Substance Use Topics  . Smoking status: Former Smoker    Quit date: 09/01/2014  . Smokeless tobacco: Not on file  . Alcohol Use: Yes    Review of Systems  Constitutional: Negative for fever.  HENT: Negative for drooling and rhinorrhea.   Eyes: Negative for pain.  Respiratory: Negative for cough and shortness of breath.   Cardiovascular: Positive for chest pain and syncope. Negative for leg swelling.  Gastrointestinal:  Negative for nausea, vomiting, abdominal pain and diarrhea.  Genitourinary: Negative for dysuria and hematuria.  Musculoskeletal: Negative for gait problem and neck pain.  Skin: Negative for color change.  Neurological: Negative for numbness and headaches.  Hematological: Negative for adenopathy.  Psychiatric/Behavioral: Negative for behavioral problems.  All other systems reviewed and are negative.     Allergies  Review of patient's allergies indicates no known allergies.  Home Medications   Prior to Admission medications   Medication Sig Start Date End Date Taking? Authorizing Provider  albuterol-ipratropium (COMBIVENT) 18-103 MCG/ACT inhaler Inhale 1 puff into the lungs every 4 (four) hours as needed for wheezing or shortness of breath. 09/12/14   Olam Idler, MD  aspirin 81 MG chewable tablet Chew 4 tablets (324 mg total) by mouth daily. 09/12/14   Olam Idler, MD  azithromycin (ZITHROMAX Z-PAK) 250 MG tablet 2 po day one, then 1 daily x 4 days 09/13/14   Debby Freiberg, MD  carvedilol (COREG) 12.5 MG tablet Take 12.5 mg by mouth 2 (two) times daily with a meal.    Historical Provider, MD  digoxin (LANOXIN) 0.125 MG tablet Take 1 tablet (0.125 mg total) by mouth daily. 09/12/14   Olam Idler, MD  furosemide (LASIX) 20 MG tablet Take 20 mg by mouth daily.    Historical Provider, MD  isosorbide mononitrate (IMDUR) 30 MG 24 hr tablet Take 1 tablet (30 mg total) by mouth daily. 09/12/14   Olam Idler, MD  lisinopril (PRINIVIL,ZESTRIL) 20 MG tablet Take 20 mg by mouth daily.    Historical Provider, MD  mometasone-formoterol (DULERA) 200-5 MCG/ACT AERO Inhale 2 puffs into the lungs 2 (two) times daily. 09/12/14   Olam Idler, MD  predniSONE (DELTASONE) 50 MG tablet Take 1 tablet (50 mg total) by mouth daily. 09/13/14   Debby Freiberg, MD  spironolactone (ALDACTONE) 25 MG tablet Take 25 mg by mouth daily.    Historical Provider, MD   BP 127/90 mmHg  Pulse 127  Temp(Src)  98 F (36.7 C) (Oral)  Resp 19  Ht 6' (1.829 m)  Wt 193 lb (87.544 kg)  BMI 26.17 kg/m2  SpO2 98% Physical Exam  Constitutional: He is oriented to person, place, and time. He appears well-developed and well-nourished.  HENT:  Head: Normocephalic and atraumatic.  Right Ear: External ear normal.  Left Ear: External ear normal.  Nose: Nose normal.  Mouth/Throat: Oropharynx is clear and moist. No oropharyngeal exudate.  Eyes: Conjunctivae and EOM are normal. Pupils are equal, round, and reactive to light.  Neck: Normal range of motion. Neck supple.  Cardiovascular: Normal rate, regular rhythm, normal heart sounds and intact distal pulses.  Exam reveals no gallop and no friction rub.   No murmur heard. Pulmonary/Chest: Effort normal and breath sounds normal. No respiratory distress. He has no wheezes.  Abdominal: Soft. Bowel sounds are normal. He exhibits no distension. There is no tenderness. There is no rebound and no guarding.  Musculoskeletal: Normal range of motion. He exhibits no edema or tenderness.  Neurological: He is alert and oriented to person, place, and time.  alert, oriented x3 speech: normal in context and clarity memory: intact grossly cranial nerves II-XII: intact motor strength: full proximally and distally no involuntary movements or tremors sensation: intact to light touch diffusely  cerebellar: finger-to-nose and heel-to-shin intact gait: normal forwards and backwards  Skin: Skin is warm and dry.  Psychiatric: He has a normal mood and affect. His behavior is normal.  Nursing note and vitals reviewed.   ED Course  Procedures (including critical care time) Labs Review Labs Reviewed  CBC WITH DIFFERENTIAL - Abnormal; Notable for the following:    WBC 11.8 (*)    RBC 3.67 (*)    Hemoglobin 11.3 (*)    HCT 35.1 (*)    Neutro Abs 8.2 (*)    Monocytes Absolute 1.4 (*)    All other components within normal limits  COMPREHENSIVE METABOLIC PANEL - Abnormal;  Notable for the following:    Glucose, Bld 107 (*)    Albumin 2.8 (*)    All other components within normal limits  PRO B NATRIURETIC PEPTIDE - Abnormal; Notable for the following:    Pro B Natriuretic peptide (BNP) 2919.0 (*)    All other components within normal limits  DIGOXIN LEVEL - Abnormal; Notable for the following:    Digoxin Level <0.3 (*)    All other components within normal limits  TROPONIN I  D-DIMER, QUANTITATIVE    Imaging Review Dg Chest 2 View  09/16/2014   CLINICAL DATA:  Syncope while using the bathroom. Heart palpitations. History of hypertension, CHF and COPD.  EXAM: CHEST  2 VIEW  COMPARISON:  Chest radiograph September 13, 2014  FINDINGS: The cardiac silhouette remains similarly enlarged. Mildly calcified aortic knob. Similar mild interstitial prominence with increased lung volumes can be seen with COPD. Increased lung volumes. No pleural effusions. No focal consolidation. No pneumothorax. Soft tissue planes and included osseous structures are nonsuspicious  IMPRESSION: Stable cardiomegaly, no acute pulmonary process.   Electronically Signed   By: Elon Alas   On: 09/16/2014 05:45     EKG Interpretation   Date/Time:  Saturday September 16 2014 04:22:33 EST Ventricular Rate:  111 PR Interval:    QRS Duration: 90 QT Interval:  334 QTC Calculation: 454 R Axis:   68 Text Interpretation:  Atrial fibrillation Ventricular premature complex  LVH with secondary repolarization abnormality Anterior ST elevation,  probably due to LVH No significant change since last tracing Confirmed by  Dillen Belmontes  MD, Yoselin Amerman ([id number]) on 09/16/2014 4:31:54 AM      MDM   Final diagnoses:  Syncope  Other chest pain    4:32 AM 68 y.o. 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 The patient states that he got up from bed to use the bathroom around 2 AM. He felt lightheaded but was able to make it to the  toilet. Immediately after using the bathroom he syncopized. He is unsure whether he stood up or syncopized while sitting. He states that just prior to this he had had a couple fleeting sharp chest pains in his left chest. He states that he felt these again when the ambulance picked him up. He is currently asymptomatic and has a normal neurologic exam. Of note , he has been seen twice recently for atypical chest pain with noncontributory workups. He does endorse a chronic cough productive of some yellowish sputum. He is afebrile and mildly tachycardic here.  We'll get screening d-dimer as patient suspected to be moderate risk as he notes he has a colon mass which has not been worked up so it is possible that he has a malignancy. He denies any new shortness of breath.   EMS reports that he got a full aspirin and approx 400 mL of IV fluid en route.   Will admit the patient to family practice given recurrent visits for chest pain and now syncope.    Pamella Pert, MD 09/16/14 [phone]

## 2014-09-17 ENCOUNTER — Encounter (HOSPITAL_COMMUNITY): Payer: Self-pay | Admitting: Cardiology

## 2014-09-17 DIAGNOSIS — I5023 Acute on chronic systolic (congestive) heart failure: Secondary | ICD-10-CM | POA: Diagnosis not present

## 2014-09-17 DIAGNOSIS — J441 Chronic obstructive pulmonary disease with (acute) exacerbation: Secondary | ICD-10-CM | POA: Diagnosis not present

## 2014-09-17 DIAGNOSIS — R1900 Intra-abdominal and pelvic swelling, mass and lump, unspecified site: Secondary | ICD-10-CM

## 2014-09-17 DIAGNOSIS — R55 Syncope and collapse: Secondary | ICD-10-CM

## 2014-09-17 DIAGNOSIS — I481 Persistent atrial fibrillation: Secondary | ICD-10-CM

## 2014-09-17 MED ORDER — ATORVASTATIN CALCIUM 20 MG PO TABS
20.0000 mg | ORAL_TABLET | Freq: Every day | ORAL | Status: DC
  Administered 2014-09-17 – 2014-09-18 (×2): 20 mg via ORAL
  Filled 2014-09-17 (×2): qty 1

## 2014-09-17 NOTE — Progress Notes (Signed)
Utilization Review Completed.   Donica Derouin, RN, BSN Nurse Case Manager  

## 2014-09-17 NOTE — Progress Notes (Signed)
Patient is resting comfortably. Denies c/o chest pain. Maintained on telemetry and is in A-fib on the monitor with a heart rate of 102. Will continue to monitor.  Esperanza Heir, RN

## 2014-09-17 NOTE — Progress Notes (Signed)
Consulting cardiologist: Dr. Dola Argyle  Subjective: No chest pain, feels better in general. No orthopnea. Stable appetite. No palpitations.  Objective: Vital signs in last 24 hours: Temp:  [97.5 F (36.4 C)-98.2 F (36.8 C)] 97.7 F (36.5 C) (11/29 0624) Pulse Rate:  [80-99] 97 (11/29 0624) Resp:  [18-20] 18 (11/29 0624) BP: (108-122)/(73-89) 122/89 mmHg (11/29 0624) SpO2:  [97 %-100 %] 99 % (11/29 0830) Weight:  [187 lb (84.823 kg)] 187 lb (84.823 kg) (11/29 0624) Weight change: -2 lb 11.9 oz (-1.244 kg)   Intake/Output from previous day: 11/28 0701 - 11/29 0700 In: 924 [P.O.:924] Out: 3000 [Urine:3000]  Telemetry: Atrial fibrillation, burst of NSVT.  PE: NAD Skin:Warm and dry, brisk capillary refill HEENT:normocephalic, sclera clear, mucus membranes moist Heart:irreg irreg  without murmur, gallup, rub or click Lungs: without rales,+  rhonchi, + wheezes, freq cough. OZD:GUYQ, non tender, + BS, do not palpate liver spleen or masses Ext:no lower ext edema, 2+ pedal pulses, 2+ radial pulses Neuro:alert and oriented X 3, MAE, follows commands, + facial symmetry   Lab Results:  Recent Labs  09/16/14 0430  WBC 11.8*  HGB 11.3*  HCT 35.1*  PLT 223   BMET  Recent Labs  09/16/14 0430  NA 142  K 4.8  CL 108  CO2 23  GLUCOSE 107*  BUN 16  CREATININE 0.80  CALCIUM 8.9    Recent Labs  09/16/14 1004 09/16/14 1620  TROPONINI <0.30 <0.30    Lab Results  Component Value Date   CHOL 137 09/16/2014   HDL 43 09/16/2014   LDLCALC 73 09/16/2014   TRIG 103 09/16/2014   CHOLHDL 3.2 09/16/2014   Lab Results  Component Value Date   HGBA1C 5.3 09/16/2014     Lab Results  Component Value Date   TSH 1.160 09/16/2014   Hepatic Function Panel  Recent Labs  09/16/14 0430  PROT 6.5  ALBUMIN 2.8*  AST 25  ALT 32  ALKPHOS 54  BILITOT 0.7    Recent Labs  09/16/14 1004  CHOL 137    Studies/Results: Dg Chest 2 View  09/16/2014   CLINICAL DATA:   Syncope while using the bathroom. Heart palpitations. History of hypertension, CHF and COPD.  EXAM: CHEST  2 VIEW  COMPARISON:  Chest radiograph September 13, 2014  FINDINGS: The cardiac silhouette remains similarly enlarged. Mildly calcified aortic knob. Similar mild interstitial prominence with increased lung volumes can be seen with COPD. Increased lung volumes. No pleural effusions. No focal consolidation. No pneumothorax. Soft tissue planes and included osseous structures are nonsuspicious  IMPRESSION: Stable cardiomegaly, no acute pulmonary process.   Electronically Signed   By: Elon Alas   On: 09/16/2014 05:45    ECHO: Study Conclusions - Left ventricle: The cavity size was severely dilated. Wall thickness was normal. The estimated ejection fraction was 15%. Diffuse hypokinesis. - Aortic valve: Valve area (Vmax): 2.1 cm^2. - Mitral valve: Dilated annulus. Structurally normal valve. There was severe regurgitation. - Left atrium: The atrium was moderately to severely dilated. - Right ventricle: The cavity size was dilated. Systolic function was mildly reduced. - Right atrium: The atrium was mildly to moderately dilated. - Tricuspid valve: There was severe regurgitation. - Pericardium, extracardiac: A trivial pericardial effusion was identified.  Medications: I have reviewed the patient's current medications. Scheduled Meds: . aspirin  324 mg Oral Daily  . azithromycin  250 mg Oral Daily  . carvedilol  12.5 mg Oral BID WC  . digoxin  0.25  mg Oral Daily  . furosemide  20 mg Oral Daily  . heparin  5,000 Units Subcutaneous 3 times per day  . lisinopril  20 mg Oral Daily  . mometasone-formoterol  2 puff Inhalation BID  . predniSONE  50 mg Oral Daily   PRN Meds:.acetaminophen, gi cocktail, ipratropium-albuterol, ondansetron (ZOFRAN) IV  Assessment/Plan: Records from [facility] in [place] reviewed in detail  1. Atrial fibrillation, permanent - he has had several  cardioversions and now per notes is permanent. Heart rate up recently. On Coreg and digoxin. Had been on Xarelto prior to rectal bleeding and biospy.  2. 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  Chest CT  08/21/14 with 3 mm rt upper lobe pulmonary nodule.   No ASA or anticoagulation for 14 days post colonoscopy. See notes from [place].   3. Syncope - etiology not clear.  Reportedly the patient was found leaning against a stall. With his left ventricular dysfunction he is certainly at risk for arrhythmias. Plan to monitor in the hospital to watch his rhythm. NSVT noted. Hx of this 08/21/14 as well with normal head CT  4. COPD exacerbation - still with cough, pt stated he stopped smoking 6 weeks ago   5. Acute on chronic systolic CHF (congestive heart failure). At this point the patient's volume status is relatively stable. I would not push for further diuresis at this time. Severe MR  6. Chest pain in ED 09/01/14 neg trop. Noted recent stress negative. Neg MI  7. Hx of acute renal failure  8. Substance abuse- tox screen neg this admit- pt goes to [facility] now for rehab.    LOS: 1 day   Time spent with pt. :20 minutes. [facility] R  Nurse Practitioner Certified Pager [phone] or after 5pm and on weekends call [phone] 09/17/2014, 8:52 AM   Attending note:  Patient seen and examined. Reviewed consultation note from Dr. Ron Parker and also records from [facility] in [place]. Hemodynamically stable today, cardiac markers are normal. LDL 73. Current UDS negative. 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 Remains in atrial fibrillation, heart rate 90-100  Summary of outside records:.  1. Patient had 3 mm right upper lobe pulmonary nodule noted by chest CT 11/2. Region of bleeding versus mass seen in the rectosigmoid colon by  abdominal/pelvic CT 11/2. Large 5 cm pedunculated mass resected from colon at 30 cm from anal verge on 11/3, subsequently noted to be adenocarcinoma. Anticoagulation recommended to be held for 14 days. He was seen by surgery, although it does not appear that any operative procedure was arranged, nor what the follow-up was for this.  2. Seen with chest discomfort in ER on 11/10, cardiac markers negative. Records also indicate known cardiomyopathy with LVEF approximately 15%, NSVT, substance abuse including cocaine, recurrent chest pain, severe mitral regurgitation  Reportedly also had a negative stress test recently although I could not locate the results. AF looks to be persistent, failed TEE guided cardioversion in October. Previously on Xarelto.  At this time would stop aspirin, continue Coreg and Lanoxin as well as lisinopril and Lasix. Consultation with Advanced Heart Failure service could be considered, although at this time it looks like medical therapy and observation is best option. He is not a candidate for device therapies at this particular time, and there are certainly other things to consider including his need for rehabilitation of substance abuse and also  colon cancer.. Primary team needs to sort out the next step in management of his colon cancer, and whether in fact he needs to be considered for any surgical or oncology management. Holding off anticoagulation as recommended following recent colonoscopy and resection of large polyp (see above).  Satira Sark, M.D., F.A.C.C.

## 2014-09-17 NOTE — Progress Notes (Signed)
Family Medicine Teaching Service Daily Progress Note Intern Pager: [phone]  Patient name: Paul Clayton Medical record number: [id number] Date of birth: [birth year] Age: 68 y.o. Gender: male  Primary Care Provider: [name], MD Consultants: Cardiology Code Status: Full  Pt Overview and Major Events to Date:  11/29 - Admitted for chest pain and syncope evaluation  Assessment and Plan: Paul Clayton is a 68 y.o. male presenting with chest pain and syncope. PMH is significant for atrial fibrillation, hypertension, systolic CHF, and COPD.  Chest pain, syncope.  Atypical chest pain, unclear if this is syncope but most likely etiology is orthostasis versus vasovagal. - Cardiology consulted - appreciate recommendations - Will hold ASA for now per cardiology recommendations - Troponins negative x3  - Admission EKG with A. fib, LVH, PVCs, ST elevation in V3 and V4 likely due to LVH - Continue coreg - Lipid panel at goal, ASCVD 10 year risk 15.7%, will start statin here - A1c 5.3 - TSH 1.160 - TTE (11/28): EF 15% severe MR and TR  Systolic CHF. Echo here shows EF 15#, severe MR/TR - BNP elevated, chest x-ray with cardiomegaly and slight pulmonary edema - Continue metoprolol and lisinopril - Continue lasix - Will proceed with medical therapy and observation at this point, per cardiology recommendations. Can consider Advanced Heart Failure service consult in the future.   Colonic Adenocarcinoma. Diagnosed Paul Clayton with a colonoscopy and biopsy on 11/3. Preliminary work up at [facility] in [place], request for records sent.  - Will further review patient's records from OSH to determine if further management was planned - Needs outpatient follow-up with GI, his PCP as already addressing this  A. Fib. With RVR on admission with rate to 127, now rate controlled.  - Continue home coreg, though unable to be more aggressive for rate control due to relative hypotension - Increased digoxin to  0.25 mg - Consider transitioning to amiodarone, though may be difficult given that patient is homeless and would need frequent monitoring - CHADsVASC score of 3 however not on anticoagulation due to recent resection of colonic mass  H/o HTN. Patient with relatively low BP on admission (105/76). Patient with dizziness all the time since starting his new medicines - Decrease lisinopril by half to 20 mg - Continue home coreg - Holding Imdur and spironolactone  COPD. Currently being treated for acute exacerbation with azithromycin and prednisone (11/28- ) for 5 day total course.  - Continue steroids and antibiotics - Continue albuterol and Dulera  FEN/GI: Saline lock IV, heart healthy diet Prophylaxis: Subcutaneous heparin  Disposition: Admitted pending above management.   Subjective:  Feeling better this morning. No chest pain. Shortness of breath improving. Good appetite.   Objective: Temp:  [97.5 F (36.4 C)-98.2 F (36.8 C)] 97.7 F (36.5 C) (11/29 0624) Pulse Rate:  [80-108] 97 (11/29 0624) Resp:  [18-20] 18 (11/29 0624) BP: (108-122)/(73-89) 122/89 mmHg (11/29 0624) SpO2:  [97 %-100 %] 100 % (11/29 0624) Weight:  [187 lb (84.823 kg)-190 lb 4.1 oz (86.3 kg)] 187 lb (84.823 kg) (11/29 6222) Physical Exam: General: NAD, alert, lying in hospital bed.  Cardiovascular: Irregularly irregular, no murmurs appreciated Respiratory: NWOB, Diffuse ronchi appreciated, no wheezes, frequent cough Abdomen: +BS, soft, nontender, nondistended Extremities: No cyanosis, no edema  Laboratory:  Recent Labs Lab 09/13/14 0900 09/16/14 0430  WBC 10.5 11.8*  HGB 10.8* 11.3*  HCT 33.7* 35.1*  PLT 182 223    Recent Labs Lab 09/13/14 0900 09/16/14 0430  NA 141 142  K 4.6 4.8  CL 106 108  CO2 26 23  BUN 14 16  CREATININE 0.93 0.80  CALCIUM 8.8 8.9  PROT  --  6.5  BILITOT  --  0.7  ALKPHOS  --  54  ALT  --  32  AST  --  25  GLUCOSE 88 107*   ProBNP 2919 Troponin negative  3  Imaging/Diagnostic Tests: Dg Chest 2 View  09/16/2014   CLINICAL DATA:  Syncope while using the bathroom. Heart palpitations. History of hypertension, CHF and COPD.  EXAM: CHEST  2 VIEW  COMPARISON:  Chest radiograph September 13, 2014  FINDINGS: The cardiac silhouette remains similarly enlarged. Mildly calcified aortic knob. Similar mild interstitial prominence with increased lung volumes can be seen with COPD. Increased lung volumes. No pleural effusions. No focal consolidation. No pneumothorax. Soft tissue planes and included osseous structures are nonsuspicious  IMPRESSION: Stable cardiomegaly, no acute pulmonary process.   Electronically Signed   By: Elon Alas   On: 09/16/2014 05:45   Dimas Chyle, MD 09/17/2014, 7:21 AM PGY-1, [facility] Intern pager: [phone], text pages welcome

## 2014-09-18 ENCOUNTER — Telehealth: Payer: Self-pay | Admitting: Internal Medicine

## 2014-09-18 ENCOUNTER — Ambulatory Visit (HOSPITAL_COMMUNITY): Payer: Medicare Other

## 2014-09-18 DIAGNOSIS — I4891 Unspecified atrial fibrillation: Secondary | ICD-10-CM

## 2014-09-18 DIAGNOSIS — I5023 Acute on chronic systolic (congestive) heart failure: Secondary | ICD-10-CM | POA: Diagnosis not present

## 2014-09-18 DIAGNOSIS — J441 Chronic obstructive pulmonary disease with (acute) exacerbation: Secondary | ICD-10-CM | POA: Diagnosis not present

## 2014-09-18 DIAGNOSIS — I482 Chronic atrial fibrillation: Secondary | ICD-10-CM | POA: Diagnosis not present

## 2014-09-18 DIAGNOSIS — R1900 Intra-abdominal and pelvic swelling, mass and lump, unspecified site: Secondary | ICD-10-CM | POA: Diagnosis not present

## 2014-09-18 LAB — MRSA PCR SCREENING: MRSA by PCR: NEGATIVE

## 2014-09-18 MED ORDER — CARVEDILOL 12.5 MG PO TABS
18.7500 mg | ORAL_TABLET | Freq: Two times a day (BID) | ORAL | Status: DC
  Administered 2014-09-18: 18.75 mg via ORAL
  Filled 2014-09-18 (×2): qty 1

## 2014-09-18 MED ORDER — DIGOXIN 250 MCG PO TABS: 0.2500 mg | ORAL_TABLET | Freq: Every day | ORAL | Status: DC

## 2014-09-18 MED ORDER — LISINOPRIL 5 MG PO TABS: 5.0000 mg | ORAL_TABLET | Freq: Every day | ORAL | Status: DC

## 2014-09-18 MED ORDER — RIVAROXABAN 20 MG PO TABS: 20.0000 mg | ORAL_TABLET | Freq: Every day | ORAL | Status: DC

## 2014-09-18 MED ORDER — CARVEDILOL 6.25 MG PO TABS: 18.7500 mg | ORAL_TABLET | Freq: Two times a day (BID) | ORAL | Status: DC

## 2014-09-18 MED ORDER — ATORVASTATIN CALCIUM 20 MG PO TABS: 20.0000 mg | ORAL_TABLET | Freq: Every day | ORAL | Status: DC

## 2014-09-18 NOTE — Progress Notes (Signed)
Patient Name: Paul Clayton Date of Encounter: 09/18/2014  Consulting cardiologist: Dr. Dola Argyle Primary cardiologist: New, previously scheduled to be seen by [facility] Failure [facility] 11/30   Active Problems:   A-fib   Mass in the abdomen   Chest pain   Syncope   COPD exacerbation   Acute on chronic systolic CHF (congestive heart failure)    SUBJECTIVE  Just recently moved from [place] to [place] to be with his daughter. Apparently had an episode atypical chest pain and passed out.   Denies any CP or SOB. No dizziness, does have some yellow productive cough  CURRENT MEDS . atorvastatin  20 mg Oral q1800  . azithromycin  250 mg Oral Daily  . carvedilol  12.5 mg Oral BID WC  . digoxin  0.25 mg Oral Daily  . furosemide  20 mg Oral Daily  . heparin  5,000 Units Subcutaneous 3 times per day  . lisinopril  20 mg Oral Daily  . mometasone-formoterol  2 puff Inhalation BID  . predniSONE  50 mg Oral Daily    OBJECTIVE  Filed Vitals:   09/17/14 1345 09/17/14 2042 09/18/14 0158 09/18/14 0523  BP: 109/95 101/68 106/82 108/68  Pulse: 106 96 88 82  Temp: 98.3 F (36.8 C) 98 F (36.7 C) 98 F (36.7 C) 98.4 F (36.9 C)  TempSrc: Oral Oral Oral Oral  Resp: 20 18 20 18   Height:      Weight:    186 lb 9.9 oz (84.649 kg)  SpO2: 95% 98% 98% 100%    Intake/Output Summary (Last 24 hours) at 09/18/14 0742 Last data filed at 09/18/14 0157  Gross per 24 hour  Intake   1200 ml  Output   1975 ml  Net   -775 ml   Filed Weights   09/16/14 0843 09/17/14 0624 09/18/14 0523  Weight: 190 lb 4.1 oz (86.3 kg) 187 lb (84.823 kg) 186 lb 9.9 oz (84.649 kg)    PHYSICAL EXAM  General: Pleasant, NAD. Neuro: Alert and oriented X 3. Moves all extremities spontaneously. Psych: Normal affect. HEENT:  Normal  Neck: Supple without bruits or JVD. Lungs:  Resp regular and unlabored, CTA. Heart: irregular. no s3, s4, or murmurs. Abdomen: Soft, non-tender, non-distended, BS + x 4.    Extremities: No clubbing, cyanosis or edema. DP/PT/Radials 2+ and equal bilaterally.  Accessory Clinical Findings  CBC  Recent Labs  09/16/14 0430  WBC 11.8*  NEUTROABS 8.2*  HGB 11.3*  HCT 35.1*  MCV 95.6  PLT 258   Basic Metabolic Panel  Recent Labs  09/16/14 0430  NA 142  K 4.8  CL 108  CO2 23  GLUCOSE 107*  BUN 16  CREATININE 0.80  CALCIUM 8.9   Liver Function Tests  Recent Labs  09/16/14 0430  AST 25  ALT 32  ALKPHOS 54  BILITOT 0.7  PROT 6.5  ALBUMIN 2.8*   No results for input(s): LIPASE, AMYLASE in the last 72 hours. Cardiac Enzymes  Recent Labs  09/16/14 0430 09/16/14 1004 09/16/14 1620  TROPONINI <0.30 <0.30 <0.30   BNP Invalid input(s): POCBNP D-Dimer  Recent Labs  09/16/14 0430  DDIMER 0.28   Hemoglobin A1C  Recent Labs  09/16/14 1004  HGBA1C 5.3   Fasting Lipid Panel  Recent Labs  09/16/14 1004  CHOL 137  HDL 43  LDLCALC 73  TRIG 103  CHOLHDL 3.2   Thyroid Function Tests  Recent Labs  09/16/14 1004  TSH 1.160    TELE  A-fib with HR 80-90s, occasional HR in 100-110s.    ECG  No new EKG  Echocardiogram 09/16/2014  LV EF: 15%  ------------------------------------------------------------------- Indications:   Chest pain (786.50).  ------------------------------------------------------------------- Study Conclusions  - Left ventricle: The cavity size was severely dilated. Wall thickness was normal. The estimated ejection fraction was 15%. Diffuse hypokinesis. - Aortic valve: Valve area (Vmax): 2.1 cm^2. - Mitral valve: Dilated annulus. Structurally normal valve. There was severe regurgitation. - Left atrium: The atrium was moderately to severely dilated. - Right ventricle: The cavity size was dilated. Systolic function was mildly reduced. - Right atrium: The atrium was mildly to moderately dilated. - Tricuspid valve: There was severe regurgitation. - Pericardium, extracardiac: A  trivial pericardial effusion was identified.    Radiology/Studies  Dg Chest 2 View  09/16/2014   CLINICAL DATA:  Syncope while using the bathroom. Heart palpitations. History of hypertension, CHF and COPD.  EXAM: CHEST  2 VIEW  COMPARISON:  Chest radiograph September 13, 2014  FINDINGS: The cardiac silhouette remains similarly enlarged. Mildly calcified aortic knob. Similar mild interstitial prominence with increased lung volumes can be seen with COPD. Increased lung volumes. No pleural effusions. No focal consolidation. No pneumothorax. Soft tissue planes and included osseous structures are nonsuspicious  IMPRESSION: Stable cardiomegaly, no acute pulmonary process.   Electronically Signed   By: Elon Alas   On: 09/16/2014 05:45   Dg Chest 2 View  09/13/2014   CLINICAL DATA:  Acute onset left-sided chest pain for 24 hr. Productive cough. Short of breath. COPD.  EXAM: CHEST  2 VIEW  COMPARISON:  09/08/2014.  FINDINGS: Emphysema and cardiomegaly are present. There is no airspace consolidation. Diffuse prominence of the interstitium is probably related to emphysema and pulmonary fibrosis. An element of interstitial pulmonary edema cannot be excluded.  Chronic bronchitic changes are present. There is no focal consolidation. No pleural effusion.  IMPRESSION: 1. Unchanged cardiomegaly. 2. Diffuse interstitial prominence is favored to represent pulmonary fibrosis. Interstitial pulmonary edema could produce a similar appearance however the stability compared to the prior exam makes fibrosis more likely. 3. Chronic bronchitic changes and emphysema.   Electronically Signed   By: Dereck Ligas M.D.   On: 09/13/2014 09:49   Dg Chest 2 View  09/08/2014   CLINICAL DATA:  Chest pain. History of CHF and COPD. Former smoker.  EXAM: CHEST  2 VIEW  COMPARISON:  None.  FINDINGS: Borderline heart size with normal pulmonary vascularity. Emphysematous changes in the lungs with scattered interstitial fibrosis  consistent with emphysema. No blunting of costophrenic angles. No pneumothorax. No focal airspace disease. Mild degenerative changes in the spine. Degenerative changes in the right acromioclavicular joint with suggestion of calcific tendinosis versus old displaced fracture fragment inferior to the clavicle.  IMPRESSION: Emphysematous changes and scattered fibrosis in the lungs. No evidence of active pulmonary disease.   Electronically Signed   By: Lucienne Capers M.D.   On: 09/08/2014 03:51   Dg Abd 2 Views  09/13/2014   CLINICAL DATA:  Abdominal soreness for 2 days followup, recent colonoscopy  EXAM: ABDOMEN - 2 VIEW  COMPARISON:  None.  FINDINGS: Scattered large and small bowel gas is noted. No abnormal mass or abnormal calcifications are seen. A small amount of retained fecal material is seen. No free air is noted. No acute bony abnormality is seen.  IMPRESSION: Nonspecific abdomen.   Electronically Signed   By: Inez Catalina M.D.   On: 09/13/2014 08:50    ASSESSMENT AND PLAN  1. 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  - continue coreg, digoxin, lasix and lisinopril  2. 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 Chest CT 08/21/14 with 3 mm rt upper lobe pulmonary nodule. No ASA or anticoagulation for 14 days post colonoscopy. See notes from [facility].   - Large 5 cm pedunculated mass resected from colon at 30 cm from anal verge on 11/3, subsequently noted to be adenocarcinoma  - per Dr. Domenic Polite note,  patient seen by surgery, although does not appear any operative procedure was arranged   3. Syncope - etiology not clear. Reportedly the patient was found leaning against a stall. With his left ventricular dysfunction he is certainly at risk for arrhythmias. Plan to monitor in the hospital to watch his rhythm. NSVT noted. Hx of this 08/21/14 as well with normal head CT  4. COPD exacerbation - still with cough, pt stated he stopped smoking 6 weeks ago   5. Acute on chronic systolic CHF (congestive heart failure). No obvious HF symptom on exam   6. Chest pain in ED 09/01/14 neg trop. Noted recent stress negative. Neg MI  7. Hx of acute renal failure  8. Substance abuse- tox screen neg this admit- pt goes to [facility] now for rehab.  - previous record indicate cocaine abuse  Signed, Almyra Deforest PA-C Pager: [phone] As above; patient seen and examined; patient at present denies CP or dyspnea; he remains in permanent atrial fibrillation; his rate is elevated; would continue coreg (increase to 18.75 mg BID) and digoxin; decrease lisinopril to 5 mg daily to allow more BP to increase coreg for adequate rate control. Continue present dose of lasix; patient euvolemic on examination. Would resume anticoagulation once able from a GI standpoint. Agree with Dr Domenic Polite patient not presently a candidate for device unless he can demonstrate compliance with FU and avoiding cocaine; also needs further evaluation for colon cancer. No plans for further ischemia eval. Enzymes negative. Does he need oncology evaluation? Kirk Ruths

## 2014-09-18 NOTE — Progress Notes (Signed)
Family Medicine Teaching Service Daily Progress Note Intern Pager: [phone]  Patient name: Paul Clayton Medical record number: [id number] Date of birth: Oct 29, 1945 Age: 68 y.o. Gender: male  Primary Care Provider: [name], MD Consultants: Cardiology Code Status: Full  Pt Overview and Major Events to Date:  11/29 - Admitted for chest pain and syncope evaluation  Assessment and Plan: Paul Clayton is a 68 y.o. male presenting with chest pain and syncope. PMH is significant for atrial fibrillation, hypertension, systolic CHF, and COPD.  Chest pain, syncope.  Atypical chest pain, unclear if this is syncope but most likely etiology is orthostasis versus vasovagal. - Cardiology consulted - appreciate recommendations - Will hold ASA for now per cardiology recommendations - Troponins negative x3  - Admission EKG with A. fib, LVH, PVCs, ST elevation in V3 and V4 likely due to LVH - Continue coreg - Lipid panel at goal, ASCVD 10 year risk 15.7%, will start statin here - A1c 5.3 - TSH 1.160 - TTE (11/28): EF 15% severe MR and TR  Systolic CHF. Echo here shows EF 15%, severe MR/TR - BNP elevated, chest x-ray with cardiomegaly and slight pulmonary edema - Continue coreg and lisinopril - Continue lasix - Will proceed with medical therapy and observation at this point, per cardiology recommendations. Can consider Advanced Heart Failure service consult in the future.   Colonic Adenocarcinoma. Diagnosed Baldo Ash with a colonoscopy and biopsy on 11/3. Preliminary work up at [facility] in [place]. - Will further review patient's records from OSH to determine if further management was planned - Needs outpatient follow-up with GI, his PCP as already addressing this  A. Fib. With RVR on admission with rate to 127, now rate controlled.  - Coreg for rate control - Increased digoxin to 0.25 mg - Consider transitioning to amiodarone, though may be difficult given that patient is homeless and would  need frequent monitoring - CHADsVASC score of 3 however not on anticoagulation due to recent resection of colonic mass - Consider restarting anticoagulation when stable from GI standpoint.   H/o HTN. Patient with relatively low BP on admission (105/76). Patient with dizziness all the time since starting his new medicines - Decrease lisinopril to 5 mg per cardiology - Increased home coreg to 18.75mg  bid per cardiology - Holding Imdur and spironolactone  COPD. Currently being treated for acute exacerbation with azithromycin and prednisone (11/28- ) for 5 day total course.  - Continue steroids and antibiotics - Continue albuterol and Dulera  FEN/GI: Saline lock IV, heart healthy diet Prophylaxis: Subcutaneous heparin  Disposition: Admitted pending above management, anticipate discharge soon. Need to determine where patient will have care. If in [place], will defer further management to his PCP. If he wants to establish care in [place], will consult Oncology and Surgery.  Subjective:  NAEON. Feeling much better this morning.   Objective: Temp:  [98 F (36.7 C)-98.4 F (36.9 C)] 98.4 F (36.9 C) (11/30 0523) Pulse Rate:  [82-106] 82 (11/30 0523) Resp:  [18-20] 18 (11/30 0523) BP: (101-109)/(68-95) 108/68 mmHg (11/30 0523) SpO2:  [95 %-100 %] 100 % (11/30 0523) Weight:  [186 lb 9.9 oz (84.649 kg)] 186 lb 9.9 oz (84.649 kg) (11/30 0523) Physical Exam: General: NAD, alert, lying in hospital bed.  Cardiovascular: Irregularly irregular, no murmurs appreciated Respiratory: NWOB, Diffuse ronchi appreciated, no wheezes, frequent cough Abdomen: +BS, soft, nontender, nondistended Extremities: No cyanosis, no edema  Laboratory:  Recent Labs Lab 09/13/14 0900 09/16/14 0430  WBC 10.5 11.8*  HGB 10.8* 11.3*  HCT 33.7* 35.1*  PLT 182 223    Recent Labs Lab 09/13/14 0900 09/16/14 0430  NA 141 142  K 4.6 4.8  CL 106 108  CO2 26 23  BUN 14 16  CREATININE 0.93 0.80  CALCIUM  8.8 8.9  PROT  --  6.5  BILITOT  --  0.7  ALKPHOS  --  54  ALT  --  32  AST  --  25  GLUCOSE 88 107*   ProBNP 2919 Troponin negative 3  Imaging/Diagnostic Tests: Dg Chest 2 View  09/16/2014   CLINICAL DATA:  Syncope while using the bathroom. Heart palpitations. History of hypertension, CHF and COPD.  EXAM: CHEST  2 VIEW  COMPARISON:  Chest radiograph September 13, 2014  FINDINGS: The cardiac silhouette remains similarly enlarged. Mildly calcified aortic knob. Similar mild interstitial prominence with increased lung volumes can be seen with COPD. Increased lung volumes. No pleural effusions. No focal consolidation. No pneumothorax. Soft tissue planes and included osseous structures are nonsuspicious  IMPRESSION: Stable cardiomegaly, no acute pulmonary process.   Electronically Signed   By: Elon Alas   On: 09/16/2014 05:45   Dimas Chyle, MD 09/18/2014, 8:18 AM PGY-1, [place] Intern pager: [phone], text pages welcome

## 2014-09-18 NOTE — Discharge Summary (Signed)
[facility] Discharge Summary  Patient name: Paul Clayton Medical record number: [id number] Date of birth: [birth year] Age: 68 y.o. Gender: male Date of Admission: 09/16/2014  Date of Discharge:09/18/2014  Admitting Physician: Blane Ohara McDiarmid, MD  Primary Care Provider: [name], MD Consultants: Cardiology  Indication for Hospitalization: Chest pain observation  Discharge Diagnoses/Problem List:  CHF, Afib, COPD, HTN, Colonic Adenocarcinoma  Disposition: Home  Discharge Condition: Improved  Discharge Exam: See progress note for day of discharge  Brief Hospital Course:  KEIMON BASALDUA is a 68 y.o. male presenting with chest pain and syncope. PMH is significant for atrial fibrillation, hypertension, systolic CHF, and COPD.  His course by problem is listed below:  Chest pain, syncope. Patient presented with atypical chest pain and questionable syncopal episode.  Cardiology was consulted given patient's history of afib and CHF. ACS work up was negative. Patient was started on atorvastatin here.   Colonic Adenocarcinoma. Diagnosed Baldo Ash with a colonoscopy and biopsy on 11/3. Preliminary work up at [facility] in [place]. Patient will follow up with oncology in [facility] for continued management.   Systolic CHF. Echo here showed EF 15%, severe MR/TR. Patient was continued on his home lasix. He was also continued on lisinopril and coreg.    A. Fib. Rate controlled with coreg.  CHADsVASC score of 3 however was not started on anticoagulation due to recent resection of colonic mass. He was restarted on Xarelto at the time of discharge.   H/o HTN. Managed on lisinopril and coreg. Held imdur and sprionolactone given relative hypotension.   COPD. Continued on treatment for acute exacerbation with azithromycin and prednisone for 5 day total course.   Issues for Follow Up:  1) Make sure patient has follow up for oncology in [place]. Oncology was called  while he was here and will set up an outpatient appointment. Number to oncology was also given on patient's discharge instructions.  2) F/u BP - Decreased lisinopril to 5mg , increased coreg to 18.75mg  bid, held imdur and spironolactone given relative hypotension 3) f/u bleeding risk. Patient was previously on xarelto, which was held because after resection of colonic mass. He has elevated CHADsVASC score of 3 and at this point, it was felt that his risk of bleeding (4 weeks s/p polypectomy) was lower than his risk of embolic event (given afib and diagnosis of colon cancer), so he was restarted on xarelto at the time of discharge.   Significant Procedures: None  Significant Labs and Imaging:   Recent Labs Lab 09/13/14 0900 09/16/14 0430  WBC 10.5 11.8*  HGB 10.8* 11.3*  HCT 33.7* 35.1*  PLT 182 223    Recent Labs Lab 09/13/14 0900 09/16/14 0430  NA 141 142  K 4.6 4.8  CL 106 108  CO2 26 23  GLUCOSE 88 107*  BUN 14 16  CREATININE 0.93 0.80  CALCIUM 8.8 8.9  ALKPHOS  --  54  AST  --  25  ALT  --  32  ALBUMIN  --  2.8*   ProBNP 2919 Troponin negative 3  Results/Tests Pending at Time of Discharge: None  Discharge Medications:    Medication List    STOP taking these medications        isosorbide mononitrate 30 MG 24 hr tablet  Commonly known as:  IMDUR     spironolactone 25 MG tablet  Commonly known as:  ALDACTONE      TAKE these medications        albuterol-ipratropium 18-103  MCG/ACT inhaler  Commonly known as:  COMBIVENT  Inhale 1 puff into the lungs every 4 (four) hours as needed for wheezing or shortness of breath.     aspirin 81 MG chewable tablet  Chew 4 tablets (324 mg total) by mouth daily.     atorvastatin 20 MG tablet  Commonly known as:  LIPITOR  Take 1 tablet (20 mg total) by mouth daily.     azithromycin 250 MG tablet  Commonly known as:  ZITHROMAX Z-PAK  2 po day one, then 1 daily x 4 days     carvedilol 6.25 MG tablet  Commonly known  as:  COREG  Take 3 tablets (18.75 mg total) by mouth 2 (two) times daily with a meal.     digoxin 0.25 MG tablet  Commonly known as:  LANOXIN  Take 1 tablet (0.25 mg total) by mouth daily.     furosemide 20 MG tablet  Commonly known as:  LASIX  Take 20 mg by mouth daily.     lisinopril 5 MG tablet  Commonly known as:  PRINIVIL,ZESTRIL  Take 1 tablet (5 mg total) by mouth daily.  Start taking on:  09/19/2014     mometasone-formoterol 200-5 MCG/ACT Aero  Commonly known as:  DULERA  Inhale 2 puffs into the lungs 2 (two) times daily.     predniSONE 50 MG tablet  Commonly known as:  DELTASONE  Take 1 tablet (50 mg total) by mouth daily.        Discharge Instructions: Please refer to Patient Instructions section of EMR for full details.  Patient was counseled important signs and symptoms that should prompt return to medical care, changes in medications, dietary instructions, activity restrictions, and follow up appointments.   Follow-Up Appointments: Follow-up Information    Follow up with Conni Slipper, MD On 09/28/2014.   Specialty:  Family Medicine   Why:  9:00am   Contact information:   [place] [address] [phone]       Follow up with [facility].   Specialty:  Oncology   Contact information:   [place] [id number] [place] [phone]      Dimas Chyle, MD 09/18/2014, 2:46 PM PGY-1, [place]

## 2014-09-18 NOTE — Progress Notes (Signed)
UR completed 

## 2014-09-18 NOTE — Progress Notes (Signed)
Patient c/o stomach ache and requested Maalox. Administered prn GI cocktail as per MD order. Will continue to monitor.  Esperanza Heir, RN

## 2014-09-18 NOTE — Progress Notes (Signed)
Patient discharged to [facility], bus pass provided.  IV removed prior to discharge; IV site clean, dry, and intact.  Discharge instructions, education, and medications discussed with patient prior to discharge; patient voices understanding of information provided.

## 2014-09-18 NOTE — Progress Notes (Signed)
Patient is sleeping peacefully. Continues to be in Afib on the monitor, and runs tachy in the 140s intermittently. Will continue to monitor.  Esperanza Heir, RN

## 2014-09-18 NOTE — Care Management Note (Signed)
    Page 1 of 1   09/18/2014     2:45:01 PM CARE MANAGEMENT NOTE 09/18/2014  Patient:  Paul Clayton, Paul Clayton   Account Number:  [id number]  Date Initiated:  09/18/2014  Documentation initiated by:  Kaylla Cobos  Subjective/Objective Assessment:   CP, EF 15%     Action/Plan:   CM to follow for disposition needs   Anticipated DC Date:  09/19/2014   Anticipated DC Plan:  HOME/SELF CARE         Choice offered to / List presented to:             Status of service:  Completed, signed off Medicare Important Message given?  YES (If response is "NO", the following Medicare IM given date fields will be blank) Date Medicare IM given:  09/18/2014 Medicare IM given by:  Katalyn Matin Date Additional Medicare IM given:   Additional Medicare IM given by:    Discharge Disposition:  HOME/SELF CARE  Per UR Regulation:  Reviewed for med. necessity/level of care/duration of stay  If discussed at [place] of Stay Meetings, dates discussed:    Comments:  Amarian Botero RN, BSN, MSHL, CCM  Nurse - Case Manager,  ([facility])  [phone]  09/18/2014 Social:  From Home Dispo Plan:  Home / Self care CM will continue to monitor for d/c planning needs.

## 2014-09-18 NOTE — Telephone Encounter (Signed)
S/W [facility] ON 3 [address] AND GAVE NP APPT FOR 12/08 @ 1:45 W/DR. [name] DX-COLON CA

## 2014-09-18 NOTE — Discharge Instructions (Signed)
You were admitted with chest pain. While here we determined that you did not have a heart attack. It is important that you follow up with your primary doctor. It is also important that you follow up with the oncologist here in [place]. We are restarting you on Xarelto. If you experience any blood in your stool or blood in your urine, please seek medical care immediately.    Chest Pain Observation It is often hard to give a specific diagnosis for the cause of chest pain. Among other possibilities your symptoms might be caused by inadequate oxygen delivery to your heart (angina). Angina that is not treated or evaluated can lead to a heart attack (myocardial infarction) or death. Blood tests, electrocardiograms, and X-rays may have been done to help determine a possible cause of your chest pain. After evaluation and observation, your health care provider has determined that it is unlikely your pain was caused by an unstable condition that requires hospitalization. However, a full evaluation of your pain may need to be completed, with additional diagnostic testing as directed. It is very important to keep your follow-up appointments. Not keeping your follow-up appointments could result in permanent heart damage, disability, or death. If there is any problem keeping your follow-up appointments, you must call your health care provider. HOME CARE INSTRUCTIONS  Due to the slight chance that your pain could be angina, it is important to follow your health care provider's treatment plan and also maintain a healthy lifestyle:  Maintain or work toward achieving a healthy weight.  Stay physically active and exercise regularly.  Decrease your salt intake.  Eat a balanced, healthy diet. Talk to a dietitian to learn about heart-healthy foods.  Increase your fiber intake by including whole grains, vegetables, fruits, and nuts in your diet.  Avoid situations that cause stress, anger, or depression.  Take  medicines as advised by your health care provider. Report any side effects to your health care provider. Do not stop medicines or adjust the dosages on your own.  Quit smoking. Do not use nicotine patches or gum until you check with your health care provider.  Keep your blood pressure, blood sugar, and cholesterol levels within normal limits.  Limit alcohol intake to no more than 1 drink per day for women who are not pregnant and 2 drinks per day for men.  Do not abuse drugs. SEEK IMMEDIATE MEDICAL CARE IF: You have severe chest pain or pressure which may include symptoms such as:  You feel pain or pressure in your arms, neck, jaw, or back.  You have severe back or abdominal pain, feel sick to your stomach (nauseous), or throw up (vomit).  You are sweating profusely.  You are having a fast or irregular heartbeat.  You feel short of breath while at rest.  You notice increasing shortness of breath during rest, sleep, or with activity.  You have chest pain that does not get better after rest or after taking your usual medicine.  You wake from sleep with chest pain.  You are unable to sleep because you cannot breathe.  You develop a frequent cough or you are coughing up blood.  You feel dizzy, faint, or experience extreme fatigue.  You develop severe weakness, dizziness, fainting, or chills. Any of these symptoms may represent a serious problem that is an emergency. Do not wait to see if the symptoms will go away. Call your local emergency services (911 in the [place] Do not drive yourself to the hospital. MAKE SURE  YOU:  Understand these instructions.  Will watch your condition.  Will get help right away if you are not doing well or get worse. Document Released: 11/08/2010 Document Revised: 10/11/2013 Document Reviewed: 04/07/2013 [facility] Patient Information 2015 [place], [place]. This information is not intended to replace advice given to you by your health care provider.  Make sure you discuss any questions you have with your health care provider.

## 2014-09-19 ENCOUNTER — Encounter: Payer: Self-pay | Admitting: *Deleted

## 2014-09-19 NOTE — Progress Notes (Signed)
Prior Authorization received from [facility] for Xarelto 20 mg.  PA form placed in provider box for completion. Derl Barrow, RN

## 2014-09-22 ENCOUNTER — Telehealth: Payer: Self-pay | Admitting: Family Medicine

## 2014-09-22 ENCOUNTER — Telehealth: Payer: Self-pay | Admitting: *Deleted

## 2014-09-22 ENCOUNTER — Ambulatory Visit (INDEPENDENT_AMBULATORY_CARE_PROVIDER_SITE_OTHER): Payer: Medicare Other | Admitting: Pharmacist

## 2014-09-22 ENCOUNTER — Telehealth: Payer: Self-pay | Admitting: Pharmacist

## 2014-09-22 ENCOUNTER — Encounter: Payer: Self-pay | Admitting: Pharmacist

## 2014-09-22 VITALS — BP 105/64 | HR 120 | Ht 72.0 in | Wt 194.0 lb

## 2014-09-22 DIAGNOSIS — R0600 Dyspnea, unspecified: Secondary | ICD-10-CM

## 2014-09-22 MED ORDER — TIOTROPIUM BROMIDE MONOHYDRATE 18 MCG IN CAPS: 18.0000 ug | ORAL_CAPSULE | Freq: Every day | RESPIRATORY_TRACT | Status: DC

## 2014-09-22 NOTE — Progress Notes (Signed)
PA form faxed to [facility] for review. Jaedin Regina L, RN  

## 2014-09-22 NOTE — Progress Notes (Signed)
PA for Xarelto 20 mg tablet approved from [facility] until 09/23/2015.  PA reference number: [id number].  Derl Barrow, RN

## 2014-09-22 NOTE — Assessment & Plan Note (Signed)
Spirometry evaluation reveals diminished with "near normal" lung disease likely due to use of Dulera this morning prior to PFTs.  However, highly symptomatic with CAT score of 25/40. Patient has been experiencing increased shortness of breath for the last few weeks while taking Combivent and Dulera. Due to symptoms initiated Spiriva Handihaler once daily at this time. Continue Dulera and Combivent at this time. Anticipate switch from Combivent to albuterol once patient finishes his current Combivent inhaler. Educated patient on purpose, proper use, potential adverse effects. Reviewed results of pulmonary function tests. Reviewed all of patient's medications with him and daughter will discard medications that patient is no longer on. Pt verbalized understanding of results and education.

## 2014-09-22 NOTE — Telephone Encounter (Signed)
Daughter called to say she forgot to ask for rx to get nicotine patches for father.  [facility] [address]

## 2014-09-22 NOTE — Patient Instructions (Addendum)
It was nice to see you today!  Start taking Spiriva - 1-2 inhalations (what ever it takes to get the contents of one capsule) once a day  Come back to see the pharmacist in early January

## 2014-09-22 NOTE — Telephone Encounter (Signed)
Pt's daughter called with concerns of pt's using his Combivent inhaler.  She stated that pt is using it more than prescribed.  She wanted to inform Dr. Salley Hews, Rosine Beat, RN

## 2014-09-22 NOTE — Telephone Encounter (Signed)
Called Mr. Overby to see if he has picked up his [facility]arel[facility] prescription [facility]. Patient did not answer, left a voicemail.   Per documentation, prior auth approved so [facility].  Nicoletta Ba, PharmD

## 2014-09-22 NOTE — Progress Notes (Signed)
S:    Patient arrives with daughter and is using a cane. Presents for lung function evaluation.   Difficult to obtain story from patient as he and daughter were fighting in the room over what the patient was doing with taking his medications and in terms of drug use.  Patient reports breathing has been gotten worse over the last few days and he reports using his Combivent inhaler more often, about 2-3 a day, especially at night. He reports using his Dulera inhaler 2 puffs twice daily. Patient reports that he took his Dulera this morning prior to coming to clinic but has not used his Combivent today.   Reports quitting smoking in September 2015 but daughter reports that he has smoked since then.   Patient was also confused about which medications he was supposed to be taking. He brought with him all of his medications. Patient is currently not on Xarelto as it is undergoing a prior authorization that was sent to clinic early this week. D[facility] to see if the [facility] been filled.    O: CAT score= 25 See Documentation Flowsheet - CAT/COPD for complete symptom scoring.  See "scanned report" or Documentation Flowsheet (discrete results - PFTs) for  Spirometry results. Patient provided good effort while attempting spirometry.   A/P: COPD: Spirometry evaluation reveals diminished with "near normal" lung disease likely due to use of Dulera this morning prior to PFTs.  However, highly symptomatic with CAT score of 25/40. Patient has been experiencing increased shortness of breath for the last few weeks while taking Combivent and Dulera. Due to symptoms initiated Spiriva Handihaler once daily at this time. Continue Dulera and Combivent at this time. Anticipate switch from Combivent to albuterol once patient finishes his current Combivent inhaler. Educated patient on purpose, proper use, potential adverse effects. Reviewed results of pulmonary function tests. Reviewed all of  patient's medications with him and daughter will discard medications that patient is no longer on. Pt verbalized understanding of results and education.  Written pt instructions provided.  F/U Pharmacy visit in early January.   Total time in face to face counseling 30 minutes.  Patient seen with Nicoletta Ba, PharmD Resident.

## 2014-09-22 NOTE — Addendum Note (Signed)
Addended by: Leavy Cella on: 09/22/2014 01:25 PM   Modules accepted: Orders

## 2014-09-22 NOTE — Telephone Encounter (Signed)
Spoke with patient's daughter Paul Clayton, who was present during patient's visit this morning. Informed her that the Xarelto should be ready for pick up as she is assisting her dad with his medications. She verbalized understanding.  Nicoletta Ba, PharmD

## 2014-09-23 ENCOUNTER — Inpatient Hospital Stay (HOSPITAL_COMMUNITY)
Admission: EM | Admit: 2014-09-23 | Discharge: 2014-09-24 | DRG: 190 | Disposition: A | Discharge status: Home / Self Care | Payer: Medicare Other | Attending: Family Medicine | Admitting: Family Medicine

## 2014-09-23 ENCOUNTER — Emergency Department (HOSPITAL_COMMUNITY): Payer: Medicare Other

## 2014-09-23 ENCOUNTER — Encounter (HOSPITAL_COMMUNITY): Payer: Self-pay | Admitting: Emergency Medicine

## 2014-09-23 DIAGNOSIS — R0602 Shortness of breath: Secondary | ICD-10-CM | POA: Diagnosis not present

## 2014-09-23 DIAGNOSIS — I5023 Acute on chronic systolic (congestive) heart failure: Secondary | ICD-10-CM | POA: Diagnosis present

## 2014-09-23 DIAGNOSIS — Z7982 Long term (current) use of aspirin: Secondary | ICD-10-CM

## 2014-09-23 DIAGNOSIS — I4891 Unspecified atrial fibrillation: Secondary | ICD-10-CM | POA: Diagnosis present

## 2014-09-23 DIAGNOSIS — J441 Chronic obstructive pulmonary disease with (acute) exacerbation: Principal | ICD-10-CM | POA: Diagnosis present

## 2014-09-23 DIAGNOSIS — Z87891 Personal history of nicotine dependence: Secondary | ICD-10-CM | POA: Diagnosis not present

## 2014-09-23 DIAGNOSIS — J9811 Atelectasis: Secondary | ICD-10-CM | POA: Diagnosis not present

## 2014-09-23 DIAGNOSIS — I10 Essential (primary) hypertension: Secondary | ICD-10-CM | POA: Diagnosis present

## 2014-09-23 DIAGNOSIS — R05 Cough: Secondary | ICD-10-CM | POA: Diagnosis not present

## 2014-09-23 DIAGNOSIS — I5021 Acute systolic (congestive) heart failure: Secondary | ICD-10-CM

## 2014-09-23 DIAGNOSIS — C189 Malignant neoplasm of colon, unspecified: Secondary | ICD-10-CM | POA: Diagnosis present

## 2014-09-23 LAB — COMPREHENSIVE METABOLIC PANEL
ALT: 90 U/L — ABNORMAL HIGH (ref 0–53)
AST: 64 U/L — ABNORMAL HIGH (ref 0–37)
Albumin: 2.7 g/dL — ABNORMAL LOW (ref 3.5–5.2)
Alkaline Phosphatase: 67 U/L (ref 39–117)
Anion gap: 12 (ref 5–15)
BUN: 16 mg/dL (ref 6–23)
CALCIUM: 8.7 mg/dL (ref 8.4–10.5)
CO2: 24 meq/L (ref 19–32)
CREATININE: 0.91 mg/dL (ref 0.50–1.35)
Chloride: 101 mEq/L (ref 96–112)
GFR, EST NON AFRICAN AMERICAN: 86 mL/min — AB (ref >90)
GLUCOSE: 112 mg/dL — AB (ref 70–99)
Potassium: 4.5 mEq/L (ref 3.7–5.3)
Sodium: 137 mEq/L (ref 137–147)
Total Bilirubin: 0.7 mg/dL (ref 0.3–1.2)
Total Protein: 6.5 g/dL (ref 6.0–8.3)

## 2014-09-23 LAB — CBC WITH DIFFERENTIAL/PLATELET
Basophils Absolute: 0 10*3/uL (ref 0.0–0.1)
Basophils Relative: 0 % (ref 0–1)
EOS PCT: 2 % (ref 0–5)
Eosinophils Absolute: 0.2 10*3/uL (ref 0.0–0.7)
HEMATOCRIT: 38.2 % — AB (ref 39.0–52.0)
Hemoglobin: 12.2 g/dL — ABNORMAL LOW (ref 13.0–17.0)
LYMPHS ABS: 2.7 10*3/uL (ref 0.7–4.0)
Lymphocytes Relative: 19 % (ref 12–46)
MCH: 29.9 pg (ref 26.0–34.0)
MCHC: 31.9 g/dL (ref 30.0–36.0)
MCV: 93.6 fL (ref 78.0–100.0)
MONO ABS: 1.3 10*3/uL — AB (ref 0.1–1.0)
Monocytes Relative: 9 % (ref 3–12)
Neutro Abs: 9.8 10*3/uL — ABNORMAL HIGH (ref 1.7–7.7)
Neutrophils Relative %: 70 % (ref 43–77)
Platelets: 255 10*3/uL (ref 150–400)
RBC: 4.08 MIL/uL — AB (ref 4.22–5.81)
RDW: 12.9 % (ref 11.5–15.5)
WBC: 13.9 10*3/uL — ABNORMAL HIGH (ref 4.0–10.5)

## 2014-09-23 LAB — DIGOXIN LEVEL: DIGOXIN LVL: 0.5 ng/mL — AB (ref 0.8–2.0)

## 2014-09-23 LAB — I-STAT TROPONIN, ED: TROPONIN I, POC: 0.05 ng/mL (ref 0.00–0.08)

## 2014-09-23 LAB — PRO B NATRIURETIC PEPTIDE: Pro B Natriuretic peptide (BNP): 4034 pg/mL — ABNORMAL HIGH (ref 0–125)

## 2014-09-23 MED ORDER — DILTIAZEM HCL 25 MG/5ML IV SOLN
20.0000 mg | Freq: Once | INTRAVENOUS | Status: AC
  Administered 2014-09-23: 20 mg via INTRAVENOUS
  Filled 2014-09-23: qty 5

## 2014-09-23 MED ORDER — IPRATROPIUM-ALBUTEROL 0.5-2.5 (3) MG/3ML IN SOLN
3.0000 mL | Freq: Once | RESPIRATORY_TRACT | Status: AC
  Administered 2014-09-23: 3 mL via RESPIRATORY_TRACT
  Filled 2014-09-23: qty 3

## 2014-09-23 MED ORDER — FUROSEMIDE 10 MG/ML IJ SOLN
40.0000 mg | Freq: Once | INTRAMUSCULAR | Status: AC
  Administered 2014-09-23: 40 mg via INTRAVENOUS
  Filled 2014-09-23: qty 4

## 2014-09-23 MED ORDER — METHYLPREDNISOLONE SODIUM SUCC 125 MG IJ SOLR
125.0000 mg | Freq: Once | INTRAMUSCULAR | Status: AC
  Administered 2014-09-23: 125 mg via INTRAVENOUS
  Filled 2014-09-23: qty 2

## 2014-09-23 NOTE — ED Notes (Signed)
EDP at bedside  

## 2014-09-23 NOTE — ED Notes (Signed)
Attempted report 

## 2014-09-23 NOTE — H&P (Signed)
[facility] Admission History and Physical Service Pager: [phone]  Patient name: Paul Clayton Medical record number: [id number] Date of birth: Mar 06, 1946 Age: 68 y.o. Gender: male  Primary Care Provider: [name], MD Consultants: None Code Status: Full  Chief Complaint: Shortness of breath, cough  Assessment and Plan: Paul Clayton is a 68 y.o. male presenting with shortness of breath and cough. PMH is significant for colonic adenocarcinoma, afib, hypertension, systolic CHF, and COPD.  Shortness of breath/cough. Most likely COPD exacerbation (increased sputum production, wheezing on exam). Possibly component of CHF exacerbation (increased LE edema, elevated BNP), though patient does not appear to be volume overloaded. Afib with RVR also likely contributing to shortness of breath. PE unlikely (Feigenbaum score of 2.5, on Xarelto for afib which was started 2 days ago). CXR shows mild basilar atelectasis. Sat 98-100% on room air during interview. -Duonebs q4hrs -Albuterol prn -s/p 120mg  solumedrol in ED -Prednisone 50mg  (12/6- ) -Doxycycline 100mg  (12/6- ) -Continue home dulera -Supplemental oxygen prn to keep O2 sats >92%  Afib. HR intermittently to 120s in ED, otherwise hemodynamically stable -telemetry -Continue home digoxin to 0.25mg  -Continue xarelto (CHADsVasc score of 3) -Discussed with cardiology fellow Dr Murvin Natal, appreciate assistance -Will avoid diltiazem given negative inotropic effects -Rate control only if HR persistently in 120s or greater -Can increase coreg to 21mg  twice daily  Systolic CHF. Echo during last admission showed EF 15%, severe MR/TR. Does not appear to be significantly volume overloaded. CXR shows cardiomegally, but no signs of volume overload. -Continue home coreg and lisinopril -Continue home lasix -Cardiology consulted during last admission, recommended medical therapy. Can consider advanced heart failure service consult  in the future.   HTN. Stable -Continue home lisinopril and coreg  Colonic Adenocarcinoma. Diagnosed in [place] with biopsy. Preliminary work up in [place], recently moved to [place] and wishes to establish care here. Has new patient appointment on 12/8.  FEN/GI: Heart Healthy diet, Saline lock IV Prophylaxis: Home xarelto  Disposition: Admitted to telemetry under attending Dr Lindell Noe.  History of Present Illness: Paul Clayton is a 68 y.o. male presenting with several days of increased shortness of breath and productive cough. Patient states that his cough and shortness of breath are worse since being discharged last week. Patient reports little sleep over the past 3 nights due to cough. Has increased yellow sputum production. Denies any chest pain. Endorses some leg swelling, but denies orthopnea. Sleeps on 1 pillow at night. He says that he has not missed any lasix, and he has been peeing normally; though does think he has drank more fluid than normal. Does not use oxygen at home. Over the past few days, patient has only been able to take a few steps before feeling tired. No pleuritic chest pain. No fevers or chills. Denies any missed doses of medications. He was recently able to pickup his prescription of xarelto and started yesterday, and took today as well. He has some concerns about recent medications he was started on causing him to not feel as good but doesn't know which medications they are.  Patient also endorses feelings like his heart is being fast. Has some occasional lightheadedness, no syncope.   In the ED patient was given 1 dose of lasix, duonebs x2, solumedrol, and 1 dose of diltiazem. Labs were remarkable for elevated BNP (4034), and negative troponin. CXR showed minimal bibasilar atelectasis. Patient continued to be dyspneic despite above interventions.   Review Of Systems: Per HPI with the following  additions: No dysuria or polyuria Otherwise 12 point review of systems  was performed and was unremarkable.  Patient Active Problem List   Diagnosis Date Noted  . Dyspnea 09/22/2014  . Chest pain 09/16/2014  . Syncope   . COPD exacerbation   . Acute on chronic systolic CHF (congestive heart failure)   . HFrEF (heart failure with reduced ejection fraction) 09/12/2014  . A-fib 09/12/2014  . COPD (chronic obstructive pulmonary disease) 09/12/2014  . Mass in the abdomen 09/12/2014   Past Medical History: Past Medical History  Diagnosis Date  . A-fib   . Hypertension   . CHF (congestive heart failure)   . COPD (chronic obstructive pulmonary disease)   . Hepatitis C   . Tobacco abuse    Past Surgical History: Past Surgical History  Procedure Laterality Date  . Cardioversion    . Colonoscopy     Social History: History  Substance Use Topics  . Smoking status: Former Smoker    Quit date: 09/01/2014  . Smokeless tobacco: Not on file  . Alcohol Use: Yes   Additional social history: No cocaine use since September.  Please also refer to relevant sections of EMR.  Family History: No family history on file. Allergies and Medications: No Known Allergies No current facility-administered medications on file prior to encounter.   Current Outpatient Prescriptions on File Prior to Encounter  Medication Sig Dispense Refill  . albuterol-ipratropium (COMBIVENT) 18-103 MCG/ACT inhaler Inhale 1 puff into the lungs every 4 (four) hours as needed for wheezing or shortness of breath. 1 Inhaler 1  . aspirin 81 MG chewable tablet Chew 4 tablets (324 mg total) by mouth daily. 300 tablet 1  . atorvastatin (LIPITOR) 20 MG tablet Take 1 tablet (20 mg total) by mouth daily. 30 tablet 0  . carvedilol (COREG) 6.25 MG tablet Take 3 tablets (18.75 mg total) by mouth 2 (two) times daily with a meal. 30 tablet 0  . digoxin (LANOXIN) 0.25 MG tablet Take 1 tablet (0.25 mg total) by mouth daily. 30 tablet 0  . furosemide (LASIX) 20 MG tablet Take 20 mg by mouth daily.    Marland Kitchen  lisinopril (PRINIVIL,ZESTRIL) 5 MG tablet Take 1 tablet (5 mg total) by mouth daily. 30 tablet 0  . mometasone-formoterol (DULERA) 200-5 MCG/ACT AERO Inhale 2 puffs into the lungs 2 (two) times daily. (Patient taking differently: Inhale 2 puffs into the lungs daily. ) 1 Inhaler 0  . rivaroxaban (XARELTO) 20 MG TABS tablet Take 1 tablet (20 mg total) by mouth daily with supper. (Patient taking differently: Take 20 mg by mouth daily at 12 noon. ) 30 tablet 0  . tiotropium (SPIRIVA HANDIHALER) 18 MCG inhalation capsule Place 1 capsule (18 mcg total) into inhaler and inhale daily. 30 capsule 3    Objective: BP 132/96 mmHg  Pulse 105  Resp 18  Ht 6' (1.829 m)  Wt 193 lb (87.544 kg)  BMI 26.17 kg/m2  SpO2 99% Exam: General: Elderly man sitting in hospital bed with venturi mask in place HEENT:NCAT,  EOMI, MMM Cardiovascular: Irregularly irregular rhythm. Distal pulses 2+ bilaterally Respiratory: Venturi mask in place, mildly dyspneic appearing. Speaking in complete sentences. Lungs with diffuse end expiratory wheezes and coarse upper airways sounds transmitted. Mild crackles at the bases. Abdomen: +BS, S, NT, ND Extremities: 1+ pitting edema to knees bilaterally, no cyanosis Skin: No rashes Neuro: Alert and oriented. No focal neurologic deficits.   Labs and Imaging: CBC BMET   Recent Labs Lab 09/23/14 2146  WBC 13.9*  HGB 12.2*  HCT 38.2*  PLT 255    Recent Labs Lab 09/23/14 2146  NA 137  K 4.5  CL 101  CO2 24  BUN 16  CREATININE 0.91  GLUCOSE 112*  CALCIUM 8.7    BNP 4034 Troponin 0.05 Digoxin 0.5  Dg Chest Port 1 View  09/23/2014   CLINICAL DATA:  Acute onset of shortness of breath, productive cough and congestion. Personal history of smoking. Initial encounter.  EXAM: PORTABLE CHEST - 1 VIEW  COMPARISON:  Chest radiograph performed 09/16/2014  FINDINGS: The lungs are well-aerated. Minimal bibasilar atelectasis is noted. There is no evidence of pleural effusion or  pneumothorax.  The cardiomediastinal silhouette is mildly enlarged. No acute osseous abnormalities are seen.  IMPRESSION: Minimal bibasilar atelectasis noted; mild cardiomegaly seen.   Electronically Signed   By: Garald Balding M.D.   On: 09/23/2014 22:49    Dimas Chyle, MD 09/24/2014, 12:32 AM PGY-1, [place] Intern pager: [phone], text pages welcome  I have seen and examined the patient. I have read and agree with the above note. My changes are noted in blue.  Tawanna Sat, MD 09/24/2014, 12:55 AM PGY-2, [place] Intern Pager: [phone], text pages welcome

## 2014-09-23 NOTE — ED Provider Notes (Signed)
CSN: [id number]     Arrival date & time 09/23/14  2134 History   First MD Initiated Contact with Patient 09/23/14 2138     Chief Complaint  Patient presents with  . Shortness of Breath     (Consider location/radiation/quality/duration/timing/severity/associated sxs/prior Treatment) The history is provided by the patient.  Paul Clayton is a 68 y.o. male hx of afib on xarelto, HTN, CHF (EF 15%), COPD, who presented with shortness of breath and cough. Been coughing for the last several days. Recently he started on Xarelto. Productive cough with yellow sputum. Denies any fevers or chills. Was recently admitted for syncope but denies any further syncope.    Past Medical History  Diagnosis Date  . A-fib   . Hypertension   . CHF (congestive heart failure)   . COPD (chronic obstructive pulmonary disease)   . Hepatitis C   . Tobacco abuse    Past Surgical History  Procedure Laterality Date  . Cardioversion    . Colonoscopy     No family history on file. History  Substance Use Topics  . Smoking status: Former Smoker    Quit date: 09/01/2014  . Smokeless tobacco: Not on file  . Alcohol Use: Yes    Review of Systems  Respiratory: Positive for cough and shortness of breath.   All other systems reviewed and are negative.     Allergies  Review of patient's allergies indicates no known allergies.  Home Medications   Prior to Admission medications   Medication Sig Start Date End Date Taking? Authorizing Provider  albuterol-ipratropium (COMBIVENT) 18-103 MCG/ACT inhaler Inhale 1 puff into the lungs every 4 (four) hours as needed for wheezing or shortness of breath. 09/12/14  Yes Olam Idler, MD  aspirin 81 MG chewable tablet Chew 4 tablets (324 mg total) by mouth daily. 09/12/14  Yes Olam Idler, MD  atorvastatin (LIPITOR) 20 MG tablet Take 1 tablet (20 mg total) by mouth daily. 09/18/14  Yes Dimas Chyle, MD  carvedilol (COREG) 6.25 MG tablet Take 3 tablets (18.75 mg  total) by mouth 2 (two) times daily with a meal. 09/18/14  Yes Dimas Chyle, MD  digoxin (LANOXIN) 0.25 MG tablet Take 1 tablet (0.25 mg total) by mouth daily. 09/18/14  Yes Dimas Chyle, MD  furosemide (LASIX) 20 MG tablet Take 20 mg by mouth daily.   Yes Historical Provider, MD  lisinopril (PRINIVIL,ZESTRIL) 5 MG tablet Take 1 tablet (5 mg total) by mouth daily. 09/19/14  Yes Dimas Chyle, MD  mometasone-formoterol (DULERA) 200-5 MCG/ACT AERO Inhale 2 puffs into the lungs 2 (two) times daily. Patient taking differently: Inhale 2 puffs into the lungs daily.  09/12/14  Yes Olam Idler, MD  rivaroxaban (XARELTO) 20 MG TABS tablet Take 1 tablet (20 mg total) by mouth daily with supper. Patient taking differently: Take 20 mg by mouth daily at 12 noon.  09/18/14  Yes Dimas Chyle, MD  tiotropium (SPIRIVA HANDIHALER) 18 MCG inhalation capsule Place 1 capsule (18 mcg total) into inhaler and inhale daily. 09/22/14   Zigmund Gottron, MD   BP 107/76 mmHg  Pulse 61  Resp 37  Ht 6' (1.829 m)  Wt 193 lb (87.544 kg)  BMI 26.17 kg/m2  SpO2 98% Physical Exam  Constitutional: He is oriented to person, place, and time.  Uncomfortable   HENT:  Head: Normocephalic.  Mouth/Throat: Oropharynx is clear and moist.  Eyes: Conjunctivae are normal. Pupils are equal, round, and reactive to light.  Neck: Normal range  of motion. Neck supple.  Cardiovascular:  Tachy, irregular   Pulmonary/Chest:  Tachypneic, diffuse wheezing   Abdominal: Soft. Bowel sounds are normal. He exhibits no distension. There is no tenderness. There is no rebound.  Musculoskeletal: Normal range of motion.  1+ edema bilaterally   Neurological: He is alert and oriented to person, place, and time. No cranial nerve deficit. Coordination normal.  Skin: Skin is warm and dry.  Psychiatric: He has a normal mood and affect. His behavior is normal. Judgment and thought content normal.  Nursing note and vitals reviewed.   ED Course   Procedures (including critical care time) Labs Review Labs Reviewed  CBC WITH DIFFERENTIAL - Abnormal; Notable for the following:    WBC 13.9 (*)    RBC 4.08 (*)    Hemoglobin 12.2 (*)    HCT 38.2 (*)    Neutro Abs 9.8 (*)    Monocytes Absolute 1.3 (*)    All other components within normal limits  COMPREHENSIVE METABOLIC PANEL - Abnormal; Notable for the following:    Glucose, Bld 112 (*)    Albumin 2.7 (*)    AST 64 (*)    ALT 90 (*)    GFR calc non Af Amer 86 (*)    All other components within normal limits  PRO B NATRIURETIC PEPTIDE - Abnormal; Notable for the following:    Pro B Natriuretic peptide (BNP) 4034.0 (*)    All other components within normal limits  DIGOXIN LEVEL - Abnormal; Notable for the following:    Digoxin Level 0.5 (*)    All other components within normal limits  I-STAT TROPOININ, ED    Imaging Review Dg Chest Port 1 View  09/23/2014   CLINICAL DATA:  Acute onset of shortness of breath, productive cough and congestion. Personal history of smoking. Initial encounter.  EXAM: PORTABLE CHEST - 1 VIEW  COMPARISON:  Chest radiograph performed 09/16/2014  FINDINGS: The lungs are well-aerated. Minimal bibasilar atelectasis is noted. There is no evidence of pleural effusion or pneumothorax.  The cardiomediastinal silhouette is mildly enlarged. No acute osseous abnormalities are seen.  IMPRESSION: Minimal bibasilar atelectasis noted; mild cardiomegaly seen.   Electronically Signed   By: Garald Balding M.D.   On: 09/23/2014 22:49     EKG Interpretation None       Date: 09/23/2014  Rate: 132  Rhythm: atrial fibrillation  QRS Axis: normal  Intervals: normal  ST/T Wave abnormalities: ST depression, likely rate related   Conduction Disutrbances:none  Narrative Interpretation:   Old EKG Reviewed: changes noted    MDM   Final diagnoses:  Shortness of breath    Paul Clayton is a 68 y.o. male here with SOB, tachycardia. Likely rapid afib with COPD  exacerbation. Will give cardizem and albuterol and steroids. Will likely need admission. On xarelto, I doubt PE.   10:54 PM HR improved with cardizem. BNP elevated. Given lasix. Will admit for COPD, CHF, afib.    Wandra Arthurs, MD 09/23/14 [phone]

## 2014-09-23 NOTE — ED Notes (Signed)
Pt arrives via [facility] with c/o SHOB, having difficulty breathing earlier today. States he took puffs of inhaler around 1200, not effective. Decreased lung sounds in all quadrants. Hx COPD.

## 2014-09-24 DIAGNOSIS — J441 Chronic obstructive pulmonary disease with (acute) exacerbation: Principal | ICD-10-CM

## 2014-09-24 LAB — GLUCOSE, CAPILLARY: Glucose-Capillary: 177 mg/dL — ABNORMAL HIGH (ref 70–99)

## 2014-09-24 LAB — CBC
HCT: 38.6 % — ABNORMAL LOW (ref 39.0–52.0)
Hemoglobin: 12.7 g/dL — ABNORMAL LOW (ref 13.0–17.0)
MCH: 30.6 pg (ref 26.0–34.0)
MCHC: 32.9 g/dL (ref 30.0–36.0)
MCV: 93 fL (ref 78.0–100.0)
PLATELETS: 256 10*3/uL (ref 150–400)
RBC: 4.15 MIL/uL — ABNORMAL LOW (ref 4.22–5.81)
RDW: 13.1 % (ref 11.5–15.5)
WBC: 13.7 10*3/uL — ABNORMAL HIGH (ref 4.0–10.5)

## 2014-09-24 LAB — BASIC METABOLIC PANEL
ANION GAP: 14 (ref 5–15)
BUN: 15 mg/dL (ref 6–23)
CO2: 24 mEq/L (ref 19–32)
CREATININE: 0.76 mg/dL (ref 0.50–1.35)
Calcium: 8.9 mg/dL (ref 8.4–10.5)
Chloride: 102 mEq/L (ref 96–112)
Glucose, Bld: 166 mg/dL — ABNORMAL HIGH (ref 70–99)
Potassium: 4.3 mEq/L (ref 3.7–5.3)
Sodium: 140 mEq/L (ref 137–147)

## 2014-09-24 LAB — RAPID URINE DRUG SCREEN, HOSP PERFORMED
Amphetamines: NOT DETECTED
Barbiturates: NOT DETECTED
Benzodiazepines: NOT DETECTED
COCAINE: NOT DETECTED
Opiates: NOT DETECTED
Tetrahydrocannabinol: NOT DETECTED

## 2014-09-24 MED ORDER — LISINOPRIL 5 MG PO TABS
5.0000 mg | ORAL_TABLET | Freq: Every day | ORAL | Status: DC
  Administered 2014-09-24: 5 mg via ORAL
  Filled 2014-09-24: qty 1

## 2014-09-24 MED ORDER — ASPIRIN 81 MG PO CHEW
324.0000 mg | CHEWABLE_TABLET | Freq: Every day | ORAL | Status: DC
  Administered 2014-09-24: 324 mg via ORAL

## 2014-09-24 MED ORDER — PREDNISONE 50 MG PO TABS
50.0000 mg | ORAL_TABLET | Freq: Every day | ORAL | Status: DC
  Administered 2014-09-24: 50 mg via ORAL
  Filled 2014-09-24 (×2): qty 1

## 2014-09-24 MED ORDER — FUROSEMIDE 20 MG PO TABS
20.0000 mg | ORAL_TABLET | Freq: Every day | ORAL | Status: DC
  Administered 2014-09-24: 20 mg via ORAL
  Filled 2014-09-24: qty 1

## 2014-09-24 MED ORDER — DOXYCYCLINE HYCLATE 100 MG PO TABS
100.0000 mg | ORAL_TABLET | Freq: Two times a day (BID) | ORAL | Status: DC
  Administered 2014-09-24 (×2): 100 mg via ORAL
  Filled 2014-09-24 (×3): qty 1

## 2014-09-24 MED ORDER — ALBUTEROL SULFATE (2.5 MG/3ML) 0.083% IN NEBU: 2.5000 mg | INHALATION_SOLUTION | RESPIRATORY_TRACT | Status: DC | PRN

## 2014-09-24 MED ORDER — ATORVASTATIN CALCIUM 20 MG PO TABS
20.0000 mg | ORAL_TABLET | Freq: Every day | ORAL | Status: DC
  Administered 2014-09-24: 20 mg via ORAL
  Filled 2014-09-24: qty 1

## 2014-09-24 MED ORDER — PREDNISONE 50 MG PO TABS: ORAL_TABLET | ORAL | Status: DC

## 2014-09-24 MED ORDER — IPRATROPIUM-ALBUTEROL 0.5-2.5 (3) MG/3ML IN SOLN
3.0000 mL | RESPIRATORY_TRACT | Status: DC
  Administered 2014-09-24: 3 mL via RESPIRATORY_TRACT
  Filled 2014-09-24: qty 3

## 2014-09-24 MED ORDER — MOMETASONE FURO-FORMOTEROL FUM 200-5 MCG/ACT IN AERO
2.0000 | INHALATION_SPRAY | Freq: Two times a day (BID) | RESPIRATORY_TRACT | Status: DC
  Administered 2014-09-24: 2 via RESPIRATORY_TRACT
  Filled 2014-09-24: qty 8.8

## 2014-09-24 MED ORDER — DOXYCYCLINE HYCLATE 100 MG PO TABS: 100.0000 mg | ORAL_TABLET | Freq: Two times a day (BID) | ORAL | Status: DC

## 2014-09-24 MED ORDER — DIGOXIN 250 MCG PO TABS
0.2500 mg | ORAL_TABLET | Freq: Every day | ORAL | Status: DC
  Administered 2014-09-24: 0.25 mg via ORAL
  Filled 2014-09-24: qty 1

## 2014-09-24 MED ORDER — CARVEDILOL 6.25 MG PO TABS
18.7500 mg | ORAL_TABLET | Freq: Two times a day (BID) | ORAL | Status: DC
  Administered 2014-09-24: 18.75 mg via ORAL
  Filled 2014-09-24 (×3): qty 1

## 2014-09-24 MED ORDER — RIVAROXABAN 20 MG PO TABS
20.0000 mg | ORAL_TABLET | Freq: Every day | ORAL | Status: DC
  Administered 2014-09-24: 20 mg via ORAL
  Filled 2014-09-24: qty 1

## 2014-09-24 MED ORDER — IPRATROPIUM-ALBUTEROL 0.5-2.5 (3) MG/3ML IN SOLN
3.0000 mL | Freq: Three times a day (TID) | RESPIRATORY_TRACT | Status: DC
  Administered 2014-09-24 (×2): 3 mL via RESPIRATORY_TRACT
  Filled 2014-09-24 (×2): qty 3

## 2014-09-24 NOTE — Discharge Instructions (Signed)
Your trouble breathing was due to COPD exacerbation. You should continue taking your antibiotics and steroids as prescribed for the next 4 days.   Chronic Obstructive Pulmonary Disease Chronic obstructive pulmonary disease (COPD) is a common lung problem. In COPD, the flow of air from the lungs is limited. The way your lungs work will probably never return to normal, but there are things you can do to improve your lungs and make yourself feel better. HOME CARE  Take all medicines as told by your doctor.  Avoid medicines or cough syrups that dry up your airway (such as antihistamines) and do not allow you to get rid of thick spit. You do not need to avoid them if told differently by your doctor.  If you smoke, stop. Smoking makes the problem worse.  Avoid being around things that make your breathing worse (like smoke, chemicals, and fumes).  Use oxygen therapy and therapy to help improve your lungs (pulmonary rehabilitation) if told by your doctor. If you need home oxygen therapy, ask your doctor if you should buy a tool to measure your oxygen level (oximeter).  Avoid people who have a sickness you can catch (contagious).  Avoid going outside when it is very hot, cold, or humid.  Eat healthy foods. Eat smaller meals more often. Rest before meals.  Stay active, but remember to also rest.  Make sure to get all the shots (vaccines) your doctor recommends. Ask your doctor if you need a pneumonia shot.  Learn and use tips on how to relax.  Learn and use tips on how to control your breathing as told by your doctor. Try:  Breathing in (inhaling) through your nose for 1 second. Then, pucker your lips and breath out (exhale) through your lips for 2 seconds.  Putting one hand on your belly (abdomen). Breathe in slowly through your nose for 1 second. Your hand on your belly should move out. Pucker your lips and breathe out slowly through your lips. Your hand on your belly should move in as you  breathe out.  Learn and use controlled coughing to clear thick spit from your lungs. The steps are: 1. Lean your head a little forward. 2. Breathe in deeply. 3. Try to hold your breath for 3 seconds. 4. Keep your mouth slightly open while coughing 2 times. 5. Spit any thick spit out into a tissue. 6. Rest and do the steps again 1 or 2 times as needed. GET HELP IF:  You cough up more thick spit than usual.  There is a change in the color or thickness of the spit.  It is harder to breathe than usual.  Your breathing is faster than usual. GET HELP RIGHT AWAY IF:   You have shortness of breath while resting.  You have shortness of breath that stops you from:  Being able to talk.  Doing normal activities.  You chest hurts for longer than 5 minutes.  Your skin color is more blue than usual.  Your pulse oximeter shows that you have low oxygen for longer than 5 minutes. MAKE SURE YOU:   Understand these instructions.  Will watch your condition.  Will get help right away if you are not doing well or get worse. Document Released: 03/24/2008 Document Revised: 02/20/2014 Document Reviewed: 06/02/2013 [facility] Patient Information 2015 [place], [place]. This information is not intended to replace advice given to you by your health care provider. Make sure you discuss any questions you have with your health care provider.

## 2014-09-24 NOTE — Discharge Summary (Signed)
[facility] Discharge Summary  Patient name: Paul Clayton Medical record number: [id number] Date of birth: February 08, 1946 Age: 68 y.o. Gender: male Date of Admission: 09/23/2014  Date of Discharge: 09/24/14 Admitting Physician: Willeen Niece, MD  Primary Care Provider: [name], MD Consultants: None  Indication for Hospitalization: COPD exacerbation  Discharge Diagnoses/Problem List:  COPD HFrEF Afib on Xarelto HTN Adenocarcinoma of Colon  Disposition: Home; Homeless shelter   Discharge Condition: Stable  Discharge Exam:  Filed Vitals:   09/24/14 1409  BP: 94/69  Pulse: 114  Temp: 97.6 F (36.4 C)  Resp: 20   General: Elderly man; NAD HEENT:NCAT, EOMI, MMM Cardiovascular: Irregularly irregular rhythm. Distal pulses 2+ bilaterally Respiratory: Speaking in complete sentences. Mild end expiratory wheezes and coarse upper airways sounds transmitted. Mild crackles at the bases. Abdomen: +BS, S, NT, ND Extremities: 1+ pitting edema to knees bilaterally, no cyanosis Skin: No rashes Neuro: Alert and oriented. No focal neurologic deficits.   Brief Hospital Course:  Paul Clayton is a 68 y.o. male presented with shortness of breath and cough and admitted for mild COPD exacerbation. PMH is significant for colonic adenocarcinoma, afib, hypertension, systolic CHF, and COPD. He was treated with Solu-Medrol and DuoNeb's.  His respiratory function quickly improved and he was able to be transitioned to by mouth steroids and antibiotics.  Discharge to complete a five-day course of prednisone 50 mg and doxycycline 100 mg twice a day.  His heart failure, A. fib, and blood pressure remained well-controlled throughout hospitalization.    Issues for Follow Up:   Established care with cardiology? Apt 12/8  Able to afford Xarelto?  [facility] he [facility]  Significant Procedures: none  Significant Labs and Imaging:   Recent Labs Lab  09/23/14 2146 09/24/14 0419  WBC 13.9* 13.7*  HGB 12.2* 12.7*  HCT 38.2* 38.6*  PLT 255 256    Recent Labs Lab 09/23/14 2146 09/24/14 0419  NA 137 140  K 4.5 4.3  CL 101 102  CO2 24 24  GLUCOSE 112* 166*  BUN 16 15  CREATININE 0.91 0.76  CALCIUM 8.7 8.9  ALKPHOS 67  --   AST 64*  --   ALT 90*  --   ALBUMIN 2.7*  --     CXR: IMPRESSION: Minimal bibasilar atelectasis noted; mild cardiomegaly seen.  Results/Tests Pending at Time of Discharge: none  Discharge Medications:    Medication List    STOP taking these medications        tiotropium 18 MCG inhalation capsule  Commonly known as:  SPIRIVA HANDIHALER      TAKE these medications        albuterol-ipratropium 18-103 MCG/ACT inhaler  Commonly known as:  COMBIVENT  Inhale 1 puff into the lungs every 4 (four) hours as needed for wheezing or shortness of breath.     aspirin 81 MG chewable tablet  Chew 4 tablets (324 mg total) by mouth daily.     atorvastatin 20 MG tablet  Commonly known as:  LIPITOR  Take 1 tablet (20 mg total) by mouth daily.     carvedilol 6.25 MG tablet  Commonly known as:  COREG  Take 3 tablets (18.75 mg total) by mouth 2 (two) times daily with a meal.     digoxin 0.25 MG tablet  Commonly known as:  LANOXIN  Take 1 tablet (0.25 mg total) by mouth daily.     doxycycline 100 MG tablet  Commonly known as:  VIBRA-TABS  Take 1 tablet (100 mg total) by mouth 2 (two) times daily.     furosemide 20 MG tablet  Commonly known as:  LASIX  Take 20 mg by mouth daily.     lisinopril 5 MG tablet  Commonly known as:  PRINIVIL,ZESTRIL  Take 1 tablet (5 mg total) by mouth daily.     mometasone-formoterol 200-5 MCG/ACT Aero  Commonly known as:  DULERA  Inhale 2 puffs into the lungs 2 (two) times daily.     predniSONE 50 MG tablet  Commonly known as:  DELTASONE  Take 50mg  once daily     rivaroxaban 20 MG Tabs tablet  Commonly known as:  XARELTO  Take 1 tablet (20 mg total) by mouth daily  with supper.        Discharge Instructions: Please refer to Patient Instructions section of EMR for full details.  Patient was counseled important signs and symptoms that should prompt return to medical care, changes in medications, dietary instructions, activity restrictions, and follow up appointments.   Follow-Up Appointments: Cardiology appointment on 12/8  Olam Idler, MD 09/24/2014, 7:39 PM PGY-2, [place]

## 2014-09-25 MED ORDER — NICOTINE 7 MG/24HR TD PT24: 7.0000 mg | MEDICATED_PATCH | Freq: Every day | TRANSDERMAL | Status: DC

## 2014-09-25 NOTE — Progress Notes (Signed)
Patient ID: Paul Clayton, male   DOB: [birth year], 68 y.o.   MRN: [id number] Reviewed: Agree with Dr. Graylin Shiver documentation and management

## 2014-09-25 NOTE — Progress Notes (Signed)
UR completed Yarenis Cerino K. Draven Laine, RN, BSN, MSHL, CCM  09/25/2014 1:45 PM

## 2014-09-25 NOTE — Telephone Encounter (Signed)
Patient attempting to quit smoking and moderately successful with short periods of cessation from cigarette use.   Willing to utilize nicotine patches to assist with quitting.  Order placed for 7mg  patches - apply one daily for use while trying to quit.  Phone call attempted.  Answering machine only.   Follow up PRN by patient request.

## 2014-09-26 ENCOUNTER — Telehealth: Payer: Self-pay | Admitting: Medical Oncology

## 2014-09-26 ENCOUNTER — Ambulatory Visit (HOSPITAL_BASED_OUTPATIENT_CLINIC_OR_DEPARTMENT_OTHER): Payer: Medicare Other | Admitting: Internal Medicine

## 2014-09-26 ENCOUNTER — Other Ambulatory Visit: Payer: Self-pay | Admitting: Medical Oncology

## 2014-09-26 ENCOUNTER — Encounter: Payer: Self-pay | Admitting: Internal Medicine

## 2014-09-26 ENCOUNTER — Telehealth: Payer: Self-pay | Admitting: Internal Medicine

## 2014-09-26 ENCOUNTER — Other Ambulatory Visit (HOSPITAL_BASED_OUTPATIENT_CLINIC_OR_DEPARTMENT_OTHER): Payer: Medicare Other

## 2014-09-26 ENCOUNTER — Ambulatory Visit: Payer: Medicare Other

## 2014-09-26 VITALS — BP 123/73 | HR 98 | Temp 98.4°F | Resp 22 | Ht 72.0 in | Wt 189.6 lb

## 2014-09-26 DIAGNOSIS — R1900 Intra-abdominal and pelvic swelling, mass and lump, unspecified site: Secondary | ICD-10-CM

## 2014-09-26 DIAGNOSIS — C187 Malignant neoplasm of sigmoid colon: Secondary | ICD-10-CM | POA: Diagnosis not present

## 2014-09-26 DIAGNOSIS — C189 Malignant neoplasm of colon, unspecified: Secondary | ICD-10-CM

## 2014-09-26 LAB — CBC WITH DIFFERENTIAL/PLATELET
BASO%: 0.1 % (ref 0.0–2.0)
BASOS ABS: 0 10*3/uL (ref 0.0–0.1)
EOS%: 0 % (ref 0.0–7.0)
Eosinophils Absolute: 0 10*3/uL (ref 0.0–0.5)
HEMATOCRIT: 40 % (ref 38.4–49.9)
HEMOGLOBIN: 12.8 g/dL — AB (ref 13.0–17.1)
LYMPH#: 1 10*3/uL (ref 0.9–3.3)
LYMPH%: 6.5 % — AB (ref 14.0–49.0)
MCH: 30.9 pg (ref 27.2–33.4)
MCHC: 32 g/dL (ref 32.0–36.0)
MCV: 96.6 fL (ref 79.3–98.0)
MONO#: 0.4 10*3/uL (ref 0.1–0.9)
MONO%: 2.7 % (ref 0.0–14.0)
NEUT#: 14.1 10*3/uL — ABNORMAL HIGH (ref 1.5–6.5)
NEUT%: 90.7 % — ABNORMAL HIGH (ref 39.0–75.0)
PLATELETS: 284 10*3/uL (ref 140–400)
RBC: 4.14 10*6/uL — ABNORMAL LOW (ref 4.20–5.82)
RDW: 13.5 % (ref 11.0–14.6)
WBC: 15.6 10*3/uL — ABNORMAL HIGH (ref 4.0–10.3)
nRBC: 0 % (ref 0–0)

## 2014-09-26 LAB — COMPREHENSIVE METABOLIC PANEL (CC13)
ALBUMIN: 2.7 g/dL — AB (ref 3.5–5.0)
ALT: 68 U/L — ABNORMAL HIGH (ref 0–55)
AST: 36 U/L — ABNORMAL HIGH (ref 5–34)
Alkaline Phosphatase: 69 U/L (ref 40–150)
Anion Gap: 10 mEq/L (ref 3–11)
BUN: 21.5 mg/dL (ref 7.0–26.0)
CALCIUM: 8.9 mg/dL (ref 8.4–10.4)
CO2: 26 mEq/L (ref 22–29)
CREATININE: 1 mg/dL (ref 0.7–1.3)
Chloride: 106 mEq/L (ref 98–109)
EGFR: 90 mL/min/{1.73_m2} — ABNORMAL LOW (ref >90)
GLUCOSE: 131 mg/dL (ref 70–140)
POTASSIUM: 4 meq/L (ref 3.5–5.1)
Sodium: 143 mEq/L (ref 136–145)
Total Bilirubin: 0.53 mg/dL (ref 0.20–1.20)
Total Protein: 6.4 g/dL (ref 6.4–8.3)

## 2014-09-26 NOTE — Progress Notes (Signed)
Checked in new patient with no issues prior to seeing the dr. He has not been traveling and he has appt card. [place] medicaid--emailed Tiffany

## 2014-09-26 NOTE — Progress Notes (Signed)
[place] [phone]   [phone]  CONSULT NOTE  REFERRING PHYSICIAN: Dr. Madison Hickman  REASON FOR CONSULTATION:  68 years old African-American male with recently diagnosed colon cancer.  HPI Paul Clayton is a 68 y.o. male with a past medical history significant for multiple medical problems including history of hypertension, congestive heart failure, COPD, hepatitis C, atrial fibrillation and currently on anticoagulation. The patient most recently to [facility] from [place] to be close to his daughter. He mentions that in October 2015 he was admitted to the hospital in [place] for evaluation of COPD and heart failure. During his hospitalization he had rectal bleeding. He underwent flexible sigmoidoscopy at that time that revealed 5.0 cm nonobstructing colon mass observed at surgery 30 CM from the anal verge and the sigmoid colon and it was bleeding. Biopsy showed moderately differentiated adenocarcinoma. He also underwent a colonoscopy that showed the 5.0 cm pedunculated colon polyp at 30 cm, resected and retrieved completely in piecemeal fashion, Endo clips deployed at polypectomy site, tatto spot submucosally injected. There was also multiple colon polyps that resected and retrieved and internal hemorrhoids.  I'm not sure if he was seen by surgical oncologist at that time.  The patient moved to [facility] and he was admitted to [facility] complaining of cough and shortness of breath which significantly improved. He mentions a history of the colon cancer and he was referred to me today for further evaluation and recommendation regarding his condition. When seen today he is feeling fine except for shortness breath with exertion. He also has cough but significant improved compared to a few weeks ago. The patient denied having any significant weight loss. He has no nausea, vomiting, diarrhea, constipation, melena or hematochezia.  Family  history significant for a mother who had hypertension and anxiety mother, father had hypertension and brother had lupus. The patient is a widow and has 4 children. His daughter lives in [place]. He used to work as a Animator. He has a history of smoking 1 pack per day for around 52 years but quit 2 months ago. He also has a history of alcohol and drug abuse but quit 2 months ago according to the patient.  HPI  Past Medical History  Diagnosis Date  . A-fib   . Hypertension   . CHF (congestive heart failure)   . COPD (chronic obstructive pulmonary disease)   . Hepatitis C   . Tobacco abuse     Past Surgical History  Procedure Laterality Date  . Cardioversion    . Colonoscopy      No family history on file.  Social History History  Substance Use Topics  . Smoking status: Former Smoker    Quit date: 09/01/2014  . Smokeless tobacco: Not on file  . Alcohol Use: Yes    No Known Allergies  Current Outpatient Prescriptions  Medication Sig Dispense Refill  . albuterol-ipratropium (COMBIVENT) 18-103 MCG/ACT inhaler Inhale 1 puff into the lungs every 4 (four) hours as needed for wheezing or shortness of breath. 1 Inhaler 1  . aspirin 81 MG chewable tablet Chew 4 tablets (324 mg total) by mouth daily. 300 tablet 1  . atorvastatin (LIPITOR) 20 MG tablet Take 1 tablet (20 mg total) by mouth daily. 30 tablet 0  . carvedilol (COREG) 6.25 MG tablet Take 3 tablets (18.75 mg total) by mouth 2 (two) times daily with a meal. 30 tablet 0  . digoxin (LANOXIN) 0.25 MG tablet Take 1 tablet (0.25  mg total) by mouth daily. 30 tablet 0  . doxycycline (VIBRA-TABS) 100 MG tablet Take 1 tablet (100 mg total) by mouth 2 (two) times daily. 8 tablet 0  . furosemide (LASIX) 20 MG tablet Take 20 mg by mouth daily.    Marland Kitchen lisinopril (PRINIVIL,ZESTRIL) 5 MG tablet Take 1 tablet (5 mg total) by mouth daily. 30 tablet 0  . mometasone-formoterol (DULERA) 200-5 MCG/ACT AERO Inhale 2 puffs into the lungs 2 (two)  times daily. (Patient taking differently: Inhale 2 puffs into the lungs daily. ) 1 Inhaler 0  . nicotine (NICODERM CQ - DOSED IN MG/24 HR) 7 mg/24hr patch Place 1 patch (7 mg total) onto the skin daily. 28 patch 3  . predniSONE (DELTASONE) 50 MG tablet Take 50mg  once daily 4 tablet 0  . rivaroxaban (XARELTO) 20 MG TABS tablet Take 1 tablet (20 mg total) by mouth daily with supper. (Patient taking differently: Take 20 mg by mouth daily at 12 noon. ) 30 tablet 0   No current facility-administered medications for this visit.    Review of Systems  Constitutional: positive for fatigue Eyes: negative Ears, nose, mouth, throat, and face: negative Respiratory: positive for dyspnea on exertion Cardiovascular: negative Gastrointestinal: negative Genitourinary:negative Integument/breast: negative Hematologic/lymphatic: negative Musculoskeletal:negative Neurological: negative Behavioral/Psych: negative Endocrine: negative Allergic/Immunologic: negative  Physical Exam  ZOX:WRUEA, healthy, no distress, well nourished and well developed SKIN: skin color, texture, turgor are normal, no rashes or significant lesions HEAD: Normocephalic, No masses, lesions, tenderness or abnormalities EYES: normal, PERRLA EARS: External ears normal, Canals clear OROPHARYNX:no exudate, no erythema and lips, buccal mucosa, and tongue normal  NECK: supple, no adenopathy, no JVD LYMPH:  no palpable lymphadenopathy, no hepatosplenomegaly LUNGS: clear to auscultation , and palpation HEART: regular rate & rhythm, no murmurs and no gallops ABDOMEN:abdomen soft, non-tender, normal bowel sounds and no masses or organomegaly BACK: Back symmetric, no curvature., No CVA tenderness EXTREMITIES:no joint deformities, effusion, or inflammation, no edema, no skin discoloration  NEURO: alert & oriented x 3 with fluent speech, no focal motor/sensory deficits  PERFORMANCE STATUS: ECOG 1  LABORATORY DATA: Lab Results    Component Value Date   WBC 15.6* 09/26/2014   HGB 12.8* 09/26/2014   HCT 40.0 09/26/2014   MCV 96.6 09/26/2014   PLT 284 09/26/2014      Chemistry      Component Value Date/Time   NA 140 09/24/2014 0419   K 4.3 09/24/2014 0419   CL 102 09/24/2014 0419   CO2 24 09/24/2014 0419   BUN 15 09/24/2014 0419   CREATININE 0.76 09/24/2014 0419      Component Value Date/Time   CALCIUM 8.9 09/24/2014 0419   ALKPHOS 67 09/23/2014 2146   AST 64* 09/23/2014 2146   ALT 90* 09/23/2014 2146   BILITOT 0.7 09/23/2014 2146       RADIOGRAPHIC STUDIES: Dg Chest 2 View  09/16/2014   CLINICAL DATA:  Syncope while using the bathroom. Heart palpitations. History of hypertension, CHF and COPD.  EXAM: CHEST  2 VIEW  COMPARISON:  Chest radiograph September 13, 2014  FINDINGS: The cardiac silhouette remains similarly enlarged. Mildly calcified aortic knob. Similar mild interstitial prominence with increased lung volumes can be seen with COPD. Increased lung volumes. No pleural effusions. No focal consolidation. No pneumothorax. Soft tissue planes and included osseous structures are nonsuspicious  IMPRESSION: Stable cardiomegaly, no acute pulmonary process.   Electronically Signed   By: Elon Alas   On: 09/16/2014 05:45   Dg Chest 2  View  09/13/2014   CLINICAL DATA:  Acute onset left-sided chest pain for 24 hr. Productive cough. Short of breath. COPD.  EXAM: CHEST  2 VIEW  COMPARISON:  09/08/2014.  FINDINGS: Emphysema and cardiomegaly are present. There is no airspace consolidation. Diffuse prominence of the interstitium is probably related to emphysema and pulmonary fibrosis. An element of interstitial pulmonary edema cannot be excluded.  Chronic bronchitic changes are present. There is no focal consolidation. No pleural effusion.  IMPRESSION: 1. Unchanged cardiomegaly. 2. Diffuse interstitial prominence is favored to represent pulmonary fibrosis. Interstitial pulmonary edema could produce a similar  appearance however the stability compared to the prior exam makes fibrosis more likely. 3. Chronic bronchitic changes and emphysema.   Electronically Signed   By: Dereck Ligas M.D.   On: 09/13/2014 09:49   Dg Chest 2 View  09/08/2014   CLINICAL DATA:  Chest pain. History of CHF and COPD. Former smoker.  EXAM: CHEST  2 VIEW  COMPARISON:  None.  FINDINGS: Borderline heart size with normal pulmonary vascularity. Emphysematous changes in the lungs with scattered interstitial fibrosis consistent with emphysema. No blunting of costophrenic angles. No pneumothorax. No focal airspace disease. Mild degenerative changes in the spine. Degenerative changes in the right acromioclavicular joint with suggestion of calcific tendinosis versus old displaced fracture fragment inferior to the clavicle.  IMPRESSION: Emphysematous changes and scattered fibrosis in the lungs. No evidence of active pulmonary disease.   Electronically Signed   By: Lucienne Capers M.D.   On: 09/08/2014 03:51   Dg Chest Port 1 View  09/23/2014   CLINICAL DATA:  Acute onset of shortness of breath, productive cough and congestion. Personal history of smoking. Initial encounter.  EXAM: PORTABLE CHEST - 1 VIEW  COMPARISON:  Chest radiograph performed 09/16/2014  FINDINGS: The lungs are well-aerated. Minimal bibasilar atelectasis is noted. There is no evidence of pleural effusion or pneumothorax.  The cardiomediastinal silhouette is mildly enlarged. No acute osseous abnormalities are seen.  IMPRESSION: Minimal bibasilar atelectasis noted; mild cardiomegaly seen.   Electronically Signed   By: Garald Balding M.D.   On: 09/23/2014 22:49   Dg Abd 2 Views  09/13/2014   CLINICAL DATA:  Abdominal soreness for 2 days followup, recent colonoscopy  EXAM: ABDOMEN - 2 VIEW  COMPARISON:  None.  FINDINGS: Scattered large and small bowel gas is noted. No abnormal mass or abnormal calcifications are seen. A small amount of retained fecal material is seen. No free  air is noted. No acute bony abnormality is seen.  IMPRESSION: Nonspecific abdomen.   Electronically Signed   By: Inez Catalina M.D.   On: 09/13/2014 08:50    ASSESSMENT: This is a very pleasant 68 years old African-American male recently diagnosed with colon adenocarcinoma involving the sigmoid colon status post resection of the malignant polyp.   PLAN: I had a lengthy discussion with the patient today about his current condition and treatment options. I don't have the full records from [facility] regarding his colonoscopy and if the patient was evaluated by surgical oncology are not. I recommended for the patient to see a gastroenterologist in good response for reevaluation of his condition and to see if he will need repeat colonoscopy. I will also refer the patient to surgical oncology for evaluation and consideration of resection of his tumor. I will also complete the staging workup by ordering CT scan of the chest, abdomen and pelvis to rule out any metastatic disease. I will order the tumor marker CEA with the  next blood work. I will see him back for follow-up visit in 2 weeks for reevaluation. The patient voices understanding of current disease status and treatment options and is in agreement with the current care plan.  All questions were answered. The patient knows to call the clinic with any problems, questions or concerns. We can certainly see the patient much sooner if necessary.  Thank you so much for allowing me to participate in the care of [place]. I will continue to follow up the patient with you and assist in his care.  I spent 40 minutes counseling the patient face to face. The total time spent in the appointment was 60 minutes.  Disclaimer: This note was dictated with voice recognition software. Similar sounding words can inadvertently be transcribed and may not be corrected upon review.   Dene Nazir K. 09/26/2014, 2:18 PM

## 2014-09-26 NOTE — Telephone Encounter (Signed)
Called to get records but no one answered phone.

## 2014-09-26 NOTE — Telephone Encounter (Signed)
gv and printed appt sched and avs for pt for Dec..Marland KitchenMarland KitchenPt sched for GI Dec 21 @ 10am

## 2014-09-27 ENCOUNTER — Telehealth: Payer: Self-pay | Admitting: Medical Oncology

## 2014-09-27 ENCOUNTER — Emergency Department (HOSPITAL_COMMUNITY)
Admission: EM | Admit: 2014-09-27 | Discharge: 2014-09-28 | Disposition: A | Discharge status: Home / Self Care | Payer: Medicare Other | Attending: Emergency Medicine | Admitting: Emergency Medicine

## 2014-09-27 ENCOUNTER — Emergency Department (HOSPITAL_COMMUNITY): Payer: Medicare Other

## 2014-09-27 ENCOUNTER — Other Ambulatory Visit: Payer: Self-pay

## 2014-09-27 ENCOUNTER — Telehealth: Payer: Self-pay | Admitting: Internal Medicine

## 2014-09-27 ENCOUNTER — Encounter (HOSPITAL_COMMUNITY): Payer: Self-pay | Admitting: *Deleted

## 2014-09-27 DIAGNOSIS — R002 Palpitations: Secondary | ICD-10-CM

## 2014-09-27 DIAGNOSIS — Z8619 Personal history of other infectious and parasitic diseases: Secondary | ICD-10-CM | POA: Diagnosis not present

## 2014-09-27 DIAGNOSIS — J441 Chronic obstructive pulmonary disease with (acute) exacerbation: Secondary | ICD-10-CM | POA: Insufficient documentation

## 2014-09-27 DIAGNOSIS — R42 Dizziness and giddiness: Secondary | ICD-10-CM | POA: Insufficient documentation

## 2014-09-27 DIAGNOSIS — Z7901 Long term (current) use of anticoagulants: Secondary | ICD-10-CM | POA: Diagnosis not present

## 2014-09-27 DIAGNOSIS — Z7982 Long term (current) use of aspirin: Secondary | ICD-10-CM | POA: Insufficient documentation

## 2014-09-27 DIAGNOSIS — I10 Essential (primary) hypertension: Secondary | ICD-10-CM | POA: Insufficient documentation

## 2014-09-27 DIAGNOSIS — Z79899 Other long term (current) drug therapy: Secondary | ICD-10-CM | POA: Diagnosis not present

## 2014-09-27 DIAGNOSIS — I4891 Unspecified atrial fibrillation: Secondary | ICD-10-CM | POA: Diagnosis not present

## 2014-09-27 DIAGNOSIS — I509 Heart failure, unspecified: Secondary | ICD-10-CM | POA: Insufficient documentation

## 2014-09-27 DIAGNOSIS — Z87891 Personal history of nicotine dependence: Secondary | ICD-10-CM | POA: Diagnosis not present

## 2014-09-27 DIAGNOSIS — R0602 Shortness of breath: Secondary | ICD-10-CM | POA: Diagnosis not present

## 2014-09-27 LAB — CBC WITH DIFFERENTIAL/PLATELET
BASOS ABS: 0 10*3/uL (ref 0.0–0.1)
Basophils Relative: 0 % (ref 0–1)
Eosinophils Absolute: 0 10*3/uL (ref 0.0–0.7)
Eosinophils Relative: 0 % (ref 0–5)
HCT: 37.4 % — ABNORMAL LOW (ref 39.0–52.0)
Hemoglobin: 12 g/dL — ABNORMAL LOW (ref 13.0–17.0)
LYMPHS PCT: 11 % — AB (ref 12–46)
Lymphs Abs: 1.7 10*3/uL (ref 0.7–4.0)
MCH: 30.3 pg (ref 26.0–34.0)
MCHC: 32.1 g/dL (ref 30.0–36.0)
MCV: 94.4 fL (ref 78.0–100.0)
Monocytes Absolute: 0.9 10*3/uL (ref 0.1–1.0)
Monocytes Relative: 6 % (ref 3–12)
NEUTROS ABS: 12.8 10*3/uL — AB (ref 1.7–7.7)
Neutrophils Relative %: 83 % — ABNORMAL HIGH (ref 43–77)
PLATELETS: 263 10*3/uL (ref 150–400)
RBC: 3.96 MIL/uL — ABNORMAL LOW (ref 4.22–5.81)
RDW: 13.2 % (ref 11.5–15.5)
WBC: 15.3 10*3/uL — AB (ref 4.0–10.5)

## 2014-09-27 LAB — COMPREHENSIVE METABOLIC PANEL
ALBUMIN: 2.7 g/dL — AB (ref 3.5–5.2)
ALT: 60 U/L — AB (ref 0–53)
AST: 32 U/L (ref 0–37)
Alkaline Phosphatase: 60 U/L (ref 39–117)
Anion gap: 12 (ref 5–15)
BUN: 19 mg/dL (ref 6–23)
CO2: 23 mEq/L (ref 19–32)
Calcium: 8.6 mg/dL (ref 8.4–10.5)
Chloride: 100 mEq/L (ref 96–112)
Creatinine, Ser: 0.73 mg/dL (ref 0.50–1.35)
GFR calc Af Amer: >90 mL/min (ref >90)
GFR calc non Af Amer: >90 mL/min (ref >90)
Glucose, Bld: 149 mg/dL — ABNORMAL HIGH (ref 70–99)
POTASSIUM: 4 meq/L (ref 3.7–5.3)
SODIUM: 135 meq/L — AB (ref 137–147)
TOTAL PROTEIN: 6.4 g/dL (ref 6.0–8.3)
Total Bilirubin: 0.4 mg/dL (ref 0.3–1.2)

## 2014-09-27 LAB — PROTIME-INR
INR: 1.49 (ref 0.00–1.49)
PROTHROMBIN TIME: 18.2 s — AB (ref 11.6–15.2)

## 2014-09-27 LAB — PRO B NATRIURETIC PEPTIDE: PRO B NATRI PEPTIDE: 3863 pg/mL — AB (ref 0–125)

## 2014-09-27 LAB — TROPONIN I: Troponin I: <0.3 ng/mL (ref <0.30)

## 2014-09-27 NOTE — Telephone Encounter (Signed)
Daughter asking for Health information . I called pt and told him I need release.

## 2014-09-27 NOTE — Telephone Encounter (Signed)
, °

## 2014-09-27 NOTE — ED Provider Notes (Signed)
CSN: [id number]     Arrival date & time 09/27/14  2208 History   First MD Initiated Contact with Patient 09/27/14 2231     Chief Complaint  Patient presents with  . Shortness of Breath     (Consider location/radiation/quality/duration/timing/severity/associated sxs/prior Treatment) HPI Paul Clayton is a 68 y.o. male with new onset A. fib on Xarelto, COPD, CHF, comes in for evaluation of shortness of breath and dizziness. Patient states about 9:00 this evening when he took his medications he began to feel his heart fluttering, became dizzy and short of breath. He reports the symptoms lasted for 25 or 30 minutes. He was told if he "experienced anything like that to call 911 immediately". He said when he was in the ambulance coming over, his symptoms resolved. He reports being asymptomatic at this time. Reports his breathing is back to normal, does not feel dizzy and does not feel his heart fluttering like previous. Denies chest pain, headache, numbness or weakness, vision changes  Past Medical History  Diagnosis Date  . A-fib   . Hypertension   . CHF (congestive heart failure)   . COPD (chronic obstructive pulmonary disease)   . Hepatitis C   . Tobacco abuse    Past Surgical History  Procedure Laterality Date  . Cardioversion    . Colonoscopy     History reviewed. No pertinent family history. History  Substance Use Topics  . Smoking status: Former Smoker    Quit date: 09/01/2014  . Smokeless tobacco: Never Used  . Alcohol Use: Yes    Review of Systems  Respiratory: Positive for shortness of breath. Negative for chest tightness.   Cardiovascular: Positive for palpitations. Negative for chest pain.  Neurological: Positive for dizziness. Negative for syncope and headaches.  All other systems reviewed and are negative.     Allergies  Review of patient's allergies indicates no known allergies.  Home Medications   Prior to Admission medications   Medication Sig Start Date  End Date Taking? Authorizing Provider  aspirin 81 MG chewable tablet Chew 4 tablets (324 mg total) by mouth daily. 09/12/14  Yes Olam Idler, MD  atorvastatin (LIPITOR) 20 MG tablet Take 1 tablet (20 mg total) by mouth daily. 09/18/14  Yes Dimas Chyle, MD  carvedilol (COREG) 6.25 MG tablet Take 3 tablets (18.75 mg total) by mouth 2 (two) times daily with a meal. 09/18/14  Yes Dimas Chyle, MD  digoxin (LANOXIN) 0.25 MG tablet Take 1 tablet (0.25 mg total) by mouth daily. 09/18/14  Yes Dimas Chyle, MD  doxycycline (VIBRA-TABS) 100 MG tablet Take 1 tablet (100 mg total) by mouth 2 (two) times daily. 09/24/14  Yes Olam Idler, MD  furosemide (LASIX) 20 MG tablet Take 20 mg by mouth daily.   Yes Historical Provider, MD  Ipratropium-Albuterol (COMBIVENT RESPIMAT) 20-100 MCG/ACT AERS respimat Inhale 1 puff into the lungs every 6 (six) hours.   Yes Historical Provider, MD  lisinopril (PRINIVIL,ZESTRIL) 5 MG tablet Take 1 tablet (5 mg total) by mouth daily. 09/19/14  Yes Dimas Chyle, MD  mometasone-formoterol (DULERA) 200-5 MCG/ACT AERO Inhale 2 puffs into the lungs 2 (two) times daily. Patient taking differently: Inhale 2 puffs into the lungs daily.  09/12/14  Yes Olam Idler, MD  predniSONE (DELTASONE) 50 MG tablet Take 50mg  once daily 09/24/14  Yes Olam Idler, MD  rivaroxaban (XARELTO) 20 MG TABS tablet Take 1 tablet (20 mg total) by mouth daily with supper. Patient taking differently: Take 20 mg  by mouth daily at 12 noon.  09/18/14  Yes Dimas Chyle, MD  tiotropium (SPIRIVA) 18 MCG inhalation capsule Place 18 mcg into inhaler and inhale daily.   Yes Historical Provider, MD  albuterol-ipratropium (COMBIVENT) 18-103 MCG/ACT inhaler Inhale 1 puff into the lungs every 4 (four) hours as needed for wheezing or shortness of breath. Patient not taking: Reported on 09/27/2014 09/12/14   Olam Idler, MD  nicotine (NICODERM CQ - DOSED IN MG/24 HR) 7 mg/24hr patch Place 1 patch (7 mg total) onto the  skin daily. Patient not taking: Reported on 09/27/2014 09/25/14   Zigmund Gottron, MD   BP 115/93 mmHg  Pulse 83  Temp(Src) 97.9 F (36.6 C) (Oral)  Resp 28  Ht 6' (1.829 m)  Wt 189 lb (85.73 kg)  BMI 25.63 kg/m2  SpO2 100% Physical Exam  Constitutional: He is oriented to person, place, and time. He appears well-developed and well-nourished.  HENT:  Head: Normocephalic and atraumatic.  Mouth/Throat: Oropharynx is clear and moist.  Eyes: Conjunctivae are normal. Pupils are equal, round, and reactive to light. Right eye exhibits no discharge. Left eye exhibits no discharge. No scleral icterus.  Neck: Normal range of motion. Neck supple. No JVD present. No tracheal deviation present.  Cardiovascular: Normal heart sounds.  Exam reveals no gallop and no friction rub.   No murmur heard. Irregularly irregular.  Pulmonary/Chest: Effort normal and breath sounds normal. No respiratory distress. He has no wheezes. He has no rales.  Abdominal: Soft. There is no tenderness.  Musculoskeletal: Normal range of motion. He exhibits no edema or tenderness.  Neurological: He is alert and oriented to person, place, and time.  Cranial Nerves II-XII grossly intact  Skin: Skin is warm and dry. No rash noted.  Psychiatric: He has a normal mood and affect.  Nursing note and vitals reviewed.   ED Course  Procedures (including critical care time) Labs Review Labs Reviewed  CBC WITH DIFFERENTIAL - Abnormal; Notable for the following:    WBC 15.3 (*)    RBC 3.96 (*)    Hemoglobin 12.0 (*)    HCT 37.4 (*)    Neutrophils Relative % 83 (*)    Neutro Abs 12.8 (*)    Lymphocytes Relative 11 (*)    All other components within normal limits  PRO B NATRIURETIC PEPTIDE - Abnormal; Notable for the following:    Pro B Natriuretic peptide (BNP) 3863.0 (*)    All other components within normal limits  PROTIME-INR - Abnormal; Notable for the following:    Prothrombin Time 18.2 (*)    All other components  within normal limits  COMPREHENSIVE METABOLIC PANEL - Abnormal; Notable for the following:    Sodium 135 (*)    Glucose, Bld 149 (*)    Albumin 2.7 (*)    ALT 60 (*)    All other components within normal limits  TROPONIN I    Imaging Review Dg Chest 2 View  09/28/2014   CLINICAL DATA:  Dizziness short of breath.  EXAM: CHEST  2 VIEW  COMPARISON:  09/23/2014  FINDINGS: Cardiac enlargement with mild vascular congestion and developing interstitial changes consistent with early edema. No blunting of costophrenic angles. No pneumothorax. Emphysematous changes.  IMPRESSION: Cardiac enlargement with mild pulmonary vascular congestion and developing interstitial changes suggesting early interstitial edema.   Electronically Signed   By: Lucienne Capers M.D.   On: 09/28/2014 00:25     EKG Interpretation   Date/Time:  Wednesday September 27 2014  22:22:12 EST Ventricular Rate:  98 PR Interval:    QRS Duration: 90 QT Interval:  343 QTC Calculation: 438 R Axis:   80 Text Interpretation:  Atrial fibrillation Ventricular premature complex  LVH with secondary repolarization abnormality Anterior ST elevation,  probably due to LVH No significant change since last tracing Confirmed by  HARRISON  MD, Mancos ([id number]) on 09/27/2014 10:25:58 PM     Meds given in ED:  Medications - No data to display  New Prescriptions   No medications on file   Filed Vitals:   09/27/14 2218 09/27/14 2222 09/27/14 2223 09/27/14 2230  BP:  124/100  115/93  Pulse:  96  83  Temp:  97.9 F (36.6 C)    TempSrc:  Oral    Resp:  21  28  Height:  6' (1.829 m)    Weight:  189 lb (85.73 kg)    SpO2: 100% 99% 100% 100%    MDM  ADD DINAPOLI is a 68 y.o. male with a history of A. fib, COPD, CHF comes in for evaluation of dizziness, palpitations, shortness of breath.  Vitals stable - WNL -afebrile, no tachypnea Pt resting comfortably in ED. denies any symptoms at this time. Reports his breathing has returned to  baseline, no dizziness, does not feel palpitations like previous. PE--not concerning further acute or emergent pathology. Lung exam benign. No evidence of fluid overload. Labwork noncontributory Imaging-chest x-ray not concerning for acute cardiopulmonary pathology  Patient reports he has a primary care appointment tomorrow. Patient feels well now, wishes to go home. Patient has good support system and follow-up. Discussed f/u with PCP and return precautions, pt very amenable to plan. Patient stable, in good condition and is appropriate for discharge  Prior to patient discharge, I discussed and reviewed this case with Dr. Aline Brochure who also saw and evaluated the patient  Final diagnoses:  Dizziness  Palpitations        Verl Dicker, PA-C 09/28/14 [place], MD 09/29/14 2046

## 2014-09-27 NOTE — ED Notes (Signed)
Pt is from [facility] and called EMS for SOB and dizziness. Pt. Was seen at [facility] a few days ago for the same complaint. Pt. Was recently discharged from [facility] last week for a.fib and was sent home on new medication. EMS put pt. On [place] 2L and pt. States SOB and dizziness went away.

## 2014-09-28 ENCOUNTER — Ambulatory Visit (INDEPENDENT_AMBULATORY_CARE_PROVIDER_SITE_OTHER): Payer: Medicare Other | Admitting: *Deleted

## 2014-09-28 ENCOUNTER — Encounter: Payer: Self-pay | Admitting: Family Medicine

## 2014-09-28 ENCOUNTER — Encounter: Payer: Self-pay | Admitting: Clinical

## 2014-09-28 ENCOUNTER — Ambulatory Visit (INDEPENDENT_AMBULATORY_CARE_PROVIDER_SITE_OTHER): Payer: Medicare Other | Admitting: Family Medicine

## 2014-09-28 VITALS — BP 117/63 | HR 60 | Temp 97.7°F | Ht 72.0 in | Wt 202.5 lb

## 2014-09-28 DIAGNOSIS — Z609 Problem related to social environment, unspecified: Secondary | ICD-10-CM

## 2014-09-28 DIAGNOSIS — Z23 Encounter for immunization: Secondary | ICD-10-CM

## 2014-09-28 DIAGNOSIS — R0600 Dyspnea, unspecified: Secondary | ICD-10-CM | POA: Diagnosis not present

## 2014-09-28 DIAGNOSIS — Z7289 Other problems related to lifestyle: Secondary | ICD-10-CM | POA: Diagnosis not present

## 2014-09-28 NOTE — Discharge Instructions (Signed)
Atrial Fibrillation Atrial fibrillation is a condition that causes your heart to beat irregularly. It may also cause your heart to beat faster than normal. Atrial fibrillation can prevent your heart from pumping blood normally. It increases your risk of stroke and heart problems. HOME CARE  Take medications as told by your doctor.  Only take medications that your doctor says are safe. Some medications can make the condition worse or happen again.  If blood thinners were prescribed by your doctor, take them exactly as told. Too much can cause bleeding. Too little and you will not have the needed protection against stroke and other problems.  Perform blood tests at home if told by your doctor.  Perform blood tests exactly as told by your doctor.  Do not drink alcohol.  Do not drink beverages with caffeine such as coffee, soda, and some teas.  Maintain a healthy weight.  Do not use diet pills unless your doctor says they are safe. They may make heart problems worse.  Follow diet instructions as told by your doctor.  Exercise regularly as told by your doctor.  Keep all follow-up appointments. GET HELP IF:  You notice a change in the speed, rhythm, or strength of your heartbeat.  You suddenly begin peeing (urinating) more often.  You get tired more easily when moving or exercising. GET HELP RIGHT AWAY IF:   You have chest or belly (abdominal) pain.  You feel sick to your stomach (nauseous).  You are short of breath.  You suddenly have swollen feet and ankles.  You feel dizzy.  You face, arms, or legs feel numb or weak.  There is a change in your vision or speech. MAKE SURE YOU:   Understand these instructions.  Will watch your condition.  Will get help right away if you are not doing well or get worse. Document Released: 07/15/2008 Document Revised: 02/20/2014 Document Reviewed: 11/16/2012 [facility] Patient Information 2015 [place], [place]. This information is not  intended to replace advice given to you by your health care provider. Make sure you discuss any questions you have with your health care provider.   You were evaluated in the ED today for your symptoms of dizziness, heart palpitations. There does not appear to be an emergent cause for your symptoms at this time. Leigh's follow-up with your primary care for your visit tomorrow for further evaluation and management of your symptoms. Return to ED for worsening symptoms

## 2014-09-28 NOTE — Progress Notes (Signed)
Patient ID: Paul Clayton, male   DOB: 02/25/1946, 68 y.o.   MRN: [id number] Subjective:   CC: Hospital follow up  HPI:  This is a 68 y.o. male who was discharged 12/6 after 1 day for mild COPD exacerbation, transitioned to PO steroids and abx and discharged with 5 days course prednisone 50 and doxycylcine 100mg  BID. Other medical problems were stable during hospitalization.  Since then, breathing has gotten better but not back to normal. Prior to that he had had an infection that was causing coughing. Still having yellow pinkish mucus from nose and throat but denies gross blood. Today is last day of prednisone and doxycycline. Has been afebrile though has had night sweats for 2 months. He attributes this to temperature fluctuation in building. Gaining weight, unclear how much because today's scale measuring 11 lbs heavier than yesterday but patient does not feel this much heavier; denies much swelling in legs and abdomen. Drinks caffeinated sugary sodas. Denies chest pain but feels heart fluttering occasionally. Unable to see cardiology for new pt appt 11/30 due to prior hospitalization.   Went to ED last night for dyspnea and dizziness, palptiations. Vitals were normal, breathing improved and dizziness improved. Physical exam was noncerning for acute issue such as pneumonia or fluid overload, and CXR was negative.    Review of Systems - Per HPI.  PMH - Syncope, abdominal mass, HFrEF, dyspnea, COPD, Colon cancer, chest pain, Afib Onc 12/8 - Awaiting colonoscopy records from [facility]. Recommended GI referral for reeval of condition and to see if need colonosc. Also referring to surg onc for eval and consideration of resection of tumor. Completing staging workup with CT chest, abd, pelvis and CEA with next bloodwork. Planning f/u 2 weeks 12/21  Objective:  Physical Exam BP 117/63 mmHg  Pulse 60  Temp(Src) 97.7 F (36.5 C) (Oral)  Ht 6' (1.829 m)  Wt 202 lb 8 oz (91.853 kg)  BMI 27.46  kg/m2 GEN: NAD CV :Irregularly irregular, normal rate PULM: CTAB, normal effort, Occasional cough, no wheezes or crackles EXTR: 2+ LE edema to mid shin, no erythema or calf tenderness, equally swollen PSYCH: normal mood and affect euthymic    Assessment:     Paul Clayton is a 68 y.o. male here for hospital f/u.    Plan:     # See problem list and after visit summary for problem-specific plans.  # Health Maintenance: Flu shot today  Follow-up: Follow up early next week for f/u of weight/dyspnea. At f/u, evaluate if pt can afford xarelto and if records from [facility] have been received.  Hilton Sinclair, MD [place]

## 2014-09-28 NOTE — ED Notes (Signed)
Pt. Left with all belongings and refused wheelchair 

## 2014-09-28 NOTE — Patient Instructions (Signed)
Good to see you.  You seem to have a little extra fluid today. - Increase your lasix (furosemide) to 40mg  daily (2 tablets) for 2 days, then go back to 20mg  daily (1 tablet). - Follow up early next week or sooner if you start having worsening shortness of breath or weight gain. - Avoid as many of the sodas. Drink 6 glasses of water daily.  Best,  Hilton Sinclair, MD

## 2014-09-28 NOTE — Progress Notes (Signed)
CSW met with pt who presents homeless and in need of resources. Pt informed CSW that he is currently staying at [facility] in [place] after moving from [place]. Pt has a daughter in [place] however daughter unable to provide housing for pt. Pt states he receives $733/month and has plans to move into his own apartment next month. CSW provided pt with a list of affordable housing apartments in [place] and information on contacting Medicaid transportation in the future.  Pt was very appreciative and appeared motivated and willing to explore resources. Pt will contact CSW if additional needs arise.  Hunt Oris, MSW, [place]

## 2014-09-30 DIAGNOSIS — Z609 Problem related to social environment, unspecified: Secondary | ICD-10-CM | POA: Insufficient documentation

## 2014-09-30 NOTE — Assessment & Plan Note (Signed)
Homeless, Living in shelter, in process of finding an apartment - Asked Constance Holster (SW) to speak with pt during appt. She ascertained he makes $742/DL; h/o drug use; compliant with medications/recommendations; gave list of apartments and told he can meet with her PRN. - Will inform Constance Holster when pt here for f/u next week.

## 2014-09-30 NOTE — Assessment & Plan Note (Addendum)
Improved since recent hospitalization for COPD exacerbation, about to finish prednisone and doxycycline. Symptoms currently consistent with mild fluid overload and mild URI. Lungs clear with no crackles/wheezes; mildly increased LE edema. Normal resp status here. - With unclear amt of weight gain but some increased orthopnea, double lasix dose for 2 days.  - weigh daily at home; as this may be difficult, F/u early next week or sooner PRN worsened dyspnea. - Called [facility] to make pt new patient appt - 12/29 at 11:40AM. - Stay well hydrated to avoid dizziness; avoid excess soda intake; 6-8 glasses water daily.

## 2014-10-05 ENCOUNTER — Emergency Department (HOSPITAL_COMMUNITY)
Admission: EM | Admit: 2014-10-05 | Discharge: 2014-10-05 | Disposition: A | Discharge status: Home / Self Care | Payer: Medicare Other | Attending: Emergency Medicine | Admitting: Emergency Medicine

## 2014-10-05 ENCOUNTER — Encounter (HOSPITAL_COMMUNITY): Payer: Self-pay | Admitting: Emergency Medicine

## 2014-10-05 ENCOUNTER — Emergency Department (HOSPITAL_COMMUNITY): Payer: Medicare Other

## 2014-10-05 DIAGNOSIS — R0602 Shortness of breath: Secondary | ICD-10-CM | POA: Diagnosis not present

## 2014-10-05 DIAGNOSIS — Z7901 Long term (current) use of anticoagulants: Secondary | ICD-10-CM | POA: Insufficient documentation

## 2014-10-05 DIAGNOSIS — Z7982 Long term (current) use of aspirin: Secondary | ICD-10-CM | POA: Insufficient documentation

## 2014-10-05 DIAGNOSIS — Z87891 Personal history of nicotine dependence: Secondary | ICD-10-CM | POA: Diagnosis not present

## 2014-10-05 DIAGNOSIS — I509 Heart failure, unspecified: Secondary | ICD-10-CM | POA: Diagnosis not present

## 2014-10-05 DIAGNOSIS — Z79899 Other long term (current) drug therapy: Secondary | ICD-10-CM | POA: Insufficient documentation

## 2014-10-05 DIAGNOSIS — I10 Essential (primary) hypertension: Secondary | ICD-10-CM | POA: Insufficient documentation

## 2014-10-05 DIAGNOSIS — R42 Dizziness and giddiness: Secondary | ICD-10-CM | POA: Diagnosis not present

## 2014-10-05 DIAGNOSIS — R069 Unspecified abnormalities of breathing: Secondary | ICD-10-CM | POA: Diagnosis not present

## 2014-10-05 DIAGNOSIS — J449 Chronic obstructive pulmonary disease, unspecified: Secondary | ICD-10-CM | POA: Insufficient documentation

## 2014-10-05 DIAGNOSIS — I482 Chronic atrial fibrillation, unspecified: Secondary | ICD-10-CM

## 2014-10-05 DIAGNOSIS — R799 Abnormal finding of blood chemistry, unspecified: Secondary | ICD-10-CM | POA: Insufficient documentation

## 2014-10-05 DIAGNOSIS — Z7952 Long term (current) use of systemic steroids: Secondary | ICD-10-CM | POA: Insufficient documentation

## 2014-10-05 DIAGNOSIS — Z8619 Personal history of other infectious and parasitic diseases: Secondary | ICD-10-CM | POA: Diagnosis not present

## 2014-10-05 LAB — COMPREHENSIVE METABOLIC PANEL
ALT: 59 U/L — ABNORMAL HIGH (ref 0–53)
ANION GAP: 11 (ref 5–15)
AST: 51 U/L — AB (ref 0–37)
Albumin: 2.8 g/dL — ABNORMAL LOW (ref 3.5–5.2)
Alkaline Phosphatase: 69 U/L (ref 39–117)
BILIRUBIN TOTAL: 1.1 mg/dL (ref 0.3–1.2)
BUN: 14 mg/dL (ref 6–23)
CALCIUM: 8.9 mg/dL (ref 8.4–10.5)
CHLORIDE: 104 meq/L (ref 96–112)
CO2: 25 meq/L (ref 19–32)
Creatinine, Ser: 0.78 mg/dL (ref 0.50–1.35)
GLUCOSE: 106 mg/dL — AB (ref 70–99)
Potassium: 4.3 mEq/L (ref 3.7–5.3)
Sodium: 140 mEq/L (ref 137–147)
Total Protein: 6.6 g/dL (ref 6.0–8.3)

## 2014-10-05 LAB — I-STAT VENOUS BLOOD GAS, ED
ACID-BASE EXCESS: 2 mmol/L (ref 0.0–2.0)
Bicarbonate: 25.1 mEq/L — ABNORMAL HIGH (ref 20.0–24.0)
O2 SAT: 99 %
PCO2 VEN: 31.9 mmHg — AB (ref 45.0–50.0)
PO2 VEN: 141 mmHg — AB (ref 30.0–45.0)
TCO2: 26 mmol/L (ref 0–100)
pH, Ven: 7.503 — ABNORMAL HIGH (ref 7.250–7.300)

## 2014-10-05 LAB — CBC WITH DIFFERENTIAL/PLATELET
BASOS ABS: 0 10*3/uL (ref 0.0–0.1)
BASOS PCT: 0 % (ref 0–1)
EOS ABS: 0.2 10*3/uL (ref 0.0–0.7)
Eosinophils Relative: 2 % (ref 0–5)
HCT: 39.4 % (ref 39.0–52.0)
HEMOGLOBIN: 12.3 g/dL — AB (ref 13.0–17.0)
Lymphocytes Relative: 19 % (ref 12–46)
Lymphs Abs: 2 10*3/uL (ref 0.7–4.0)
MCH: 29.4 pg (ref 26.0–34.0)
MCHC: 31.2 g/dL (ref 30.0–36.0)
MCV: 94.3 fL (ref 78.0–100.0)
Monocytes Absolute: 1 10*3/uL (ref 0.1–1.0)
Monocytes Relative: 9 % (ref 3–12)
NEUTROS PCT: 70 % (ref 43–77)
Neutro Abs: 7.7 10*3/uL (ref 1.7–7.7)
Platelets: 154 10*3/uL (ref 150–400)
RBC: 4.18 MIL/uL — ABNORMAL LOW (ref 4.22–5.81)
RDW: 14 % (ref 11.5–15.5)
WBC: 11 10*3/uL — ABNORMAL HIGH (ref 4.0–10.5)

## 2014-10-05 LAB — URINALYSIS, ROUTINE W REFLEX MICROSCOPIC
BILIRUBIN URINE: NEGATIVE
Glucose, UA: NEGATIVE mg/dL
Hgb urine dipstick: NEGATIVE
Ketones, ur: NEGATIVE mg/dL
Nitrite: NEGATIVE
PROTEIN: 100 mg/dL — AB
Specific Gravity, Urine: 1.022 (ref 1.005–1.030)
Urobilinogen, UA: 1 mg/dL (ref 0.0–1.0)
pH: 7 (ref 5.0–8.0)

## 2014-10-05 LAB — URINE MICROSCOPIC-ADD ON

## 2014-10-05 LAB — TROPONIN I: Troponin I: <0.3 ng/mL (ref <0.30)

## 2014-10-05 LAB — PRO B NATRIURETIC PEPTIDE: PRO B NATRI PEPTIDE: 4401 pg/mL — AB (ref 0–125)

## 2014-10-05 MED ORDER — ASPIRIN 325 MG PO TABS
325.0000 mg | ORAL_TABLET | Freq: Once | ORAL | Status: AC
  Administered 2014-10-05: 325 mg via ORAL
  Filled 2014-10-05: qty 1

## 2014-10-05 MED ORDER — DILTIAZEM HCL 25 MG/5ML IV SOLN
5.0000 mg | Freq: Once | INTRAVENOUS | Status: AC
  Administered 2014-10-05: 5 mg via INTRAVENOUS
  Filled 2014-10-05: qty 5

## 2014-10-05 NOTE — ED Notes (Signed)
Pt was walking around outside and called EMS for SOB. "Walked onto stretcher in truck; rode in in chair in ambulance and then insisted to be put on stretcher so to not be put in waiting room" CHF pt and states compliant with meds; took rescue inhaler this morning. States made him dizzy.

## 2014-10-05 NOTE — ED Provider Notes (Signed)
CSN: [id number]     Arrival date & time 10/05/14  7026 History   First MD Initiated Contact with Patient 10/05/14 [phone]     Chief Complaint  Patient presents with  . Shortness of Breath     (Consider location/radiation/quality/duration/timing/severity/associated sxs/prior Treatment) Patient is a 68 y.o. male presenting with dizziness.  Dizziness Quality:  Lightheadedness Severity:  Moderate Onset quality:  Gradual Duration:  1 day Timing:  Constant Progression:  Partially resolved Chronicity:  Recurrent Context comment:  Recent change in medication and symptoms daily since that time Relieved by:  Nothing Worsened by:  Nothing tried Ineffective treatments:  None tried Associated symptoms: shortness of breath (chronically)   Associated symptoms: no blood in stool, no diarrhea, no nausea and no vomiting     Past Medical History  Diagnosis Date  . A-fib   . Hypertension   . CHF (congestive heart failure)   . COPD (chronic obstructive pulmonary disease)   . Hepatitis C   . Tobacco abuse    Past Surgical History  Procedure Laterality Date  . Cardioversion    . Colonoscopy     History reviewed. No pertinent family history. History  Substance Use Topics  . Smoking status: Former Smoker    Quit date: 09/01/2014  . Smokeless tobacco: Never Used  . Alcohol Use: Yes    Review of Systems  Respiratory: Positive for shortness of breath (chronically).   Gastrointestinal: Negative for nausea, vomiting, diarrhea and blood in stool.  Neurological: Positive for dizziness.  All other systems reviewed and are negative.     Allergies  Review of patient's allergies indicates no known allergies.  Home Medications   Prior to Admission medications   Medication Sig Start Date End Date Taking? Authorizing Provider  albuterol-ipratropium (COMBIVENT) 18-103 MCG/ACT inhaler Inhale 1 puff into the lungs every 4 (four) hours as needed for wheezing or shortness of breath. 09/12/14  Yes  Olam Idler, MD  aspirin 81 MG chewable tablet Chew 4 tablets (324 mg total) by mouth daily. 09/12/14  Yes Olam Idler, MD  atorvastatin (LIPITOR) 20 MG tablet Take 1 tablet (20 mg total) by mouth daily. 09/18/14  Yes Dimas Chyle, MD  carvedilol (COREG) 6.25 MG tablet Take 3 tablets (18.75 mg total) by mouth 2 (two) times daily with a meal. 09/18/14  Yes Dimas Chyle, MD  digoxin (LANOXIN) 0.25 MG tablet Take 1 tablet (0.25 mg total) by mouth daily. 09/18/14  Yes Dimas Chyle, MD  furosemide (LASIX) 20 MG tablet Take 20 mg by mouth daily.   Yes Historical Provider, MD  lisinopril (PRINIVIL,ZESTRIL) 5 MG tablet Take 1 tablet (5 mg total) by mouth daily. 09/19/14  Yes Dimas Chyle, MD  mometasone-formoterol (DULERA) 200-5 MCG/ACT AERO Inhale 2 puffs into the lungs 2 (two) times daily. Patient taking differently: Inhale 2 puffs into the lungs daily.  09/12/14  Yes Olam Idler, MD  predniSONE (DELTASONE) 50 MG tablet Take 50mg  once daily 09/24/14  Yes Olam Idler, MD  rivaroxaban (XARELTO) 20 MG TABS tablet Take 1 tablet (20 mg total) by mouth daily with supper. Patient taking differently: Take 20 mg by mouth daily at 12 noon.  09/18/14  Yes Dimas Chyle, MD  tiotropium (SPIRIVA) 18 MCG inhalation capsule Place 18 mcg into inhaler and inhale daily.   Yes Historical Provider, MD  doxycycline (VIBRA-TABS) 100 MG tablet Take 1 tablet (100 mg total) by mouth 2 (two) times daily. Patient not taking: Reported on 10/05/2014 09/24/14   Jeneen Rinks  Rosaland Lao, MD  nicotine (NICODERM CQ - DOSED IN MG/24 HR) 7 mg/24hr patch Place 1 patch (7 mg total) onto the skin daily. Patient not taking: Reported on 09/27/2014 09/25/14   Zigmund Gottron, MD   BP 136/95 mmHg  Pulse 104  Temp(Src) 97.4 F (36.3 C) (Oral)  Resp 18  SpO2 99% Physical Exam  Constitutional: He is oriented to person, place, and time. He appears well-developed and well-nourished.  HENT:  Head: Normocephalic and atraumatic.  Eyes:  Conjunctivae and EOM are normal.  Neck: Normal range of motion. Neck supple.  Cardiovascular: Normal rate and normal heart sounds.  An irregularly irregular rhythm present.  Pulmonary/Chest: Effort normal and breath sounds normal. No respiratory distress.  Abdominal: He exhibits no distension. There is no tenderness. There is no rebound and no guarding.  Musculoskeletal: Normal range of motion.  Neurological: He is alert and oriented to person, place, and time.  Skin: Skin is warm and dry.  Vitals reviewed.   ED Course  Procedures (including critical care time) Labs Review Labs Reviewed  COMPREHENSIVE METABOLIC PANEL - Abnormal; Notable for the following:    Glucose, Bld 106 (*)    Albumin 2.8 (*)    AST 51 (*)    ALT 59 (*)    All other components within normal limits  PRO B NATRIURETIC PEPTIDE - Abnormal; Notable for the following:    Pro B Natriuretic peptide (BNP) 4401.0 (*)    All other components within normal limits  CBC WITH DIFFERENTIAL - Abnormal; Notable for the following:    WBC 11.0 (*)    RBC 4.18 (*)    Hemoglobin 12.3 (*)    All other components within normal limits  URINALYSIS, ROUTINE W REFLEX MICROSCOPIC - Abnormal; Notable for the following:    Color, Urine AMBER (*)    APPearance CLOUDY (*)    Protein, ur 100 (*)    Leukocytes, UA SMALL (*)    All other components within normal limits  I-STAT VENOUS BLOOD GAS, ED - Abnormal; Notable for the following:    pH, Ven 7.503 (*)    pCO2, Ven 31.9 (*)    pO2, Ven 141.0 (*)    Bicarbonate 25.1 (*)    All other components within normal limits  TROPONIN I  URINE MICROSCOPIC-ADD ON  TROPONIN I  BLOOD GAS, VENOUS    Imaging Review Dg Chest 2 View  10/05/2014   CLINICAL DATA:  Short of breath  EXAM: CHEST  2 VIEW  COMPARISON:  09/27/2014  FINDINGS: COPD with hyperinflation.  Cardiac enlargement with vascular congestion and mild interstitial edema. Minimal pleural effusion. Minimal progression in edema since  the prior study.  IMPRESSION: Congestive heart failure with slight progression of interstitial edema.   Electronically Signed   By: Franchot Gallo M.D.   On: 10/05/2014 10:03     EKG Interpretation None      MDM   Final diagnoses:  Lightheadedness  Chronic atrial fibrillation    68 y.o. male with pertinent PMH of afib, COPD, CHF, Hep C presents with dizziness described as lightheadedness, shortness of breath beginning last night but worsening this morning. He denies chest pain, fever, cough, other symptoms. He states his symptoms have been occurring daily since change of his medications recently and thinks that this is the likely cause of his symptoms. He is on Xarelto for A. fib. On arrival today vitals signs and physical exam as above. On my examination the patient has a normal rhythm  however this is irregularly irregular consistent with A. fib. His workup as above was unremarkable including a delta troponin, given history consider ACS unlikely. No wheezing on examination. Patient states he is no longer short of breath and feels well. Discussed that this was likely medication reaction and the patient admits that he has been out of his home carvedilol, which is likely the etiology of his symptoms. His BNP is mildly elevated however this is at his baseline.  He has no signs of pulmonary edema and no pedal edema. Discharged home in stable condition and encouraged close PCP follow-up.    I have reviewed all laboratory and imaging studies if ordered as above  1. Lightheadedness   2. Chronic atrial fibrillation         Debby Freiberg, MD 10/05/14 1325

## 2014-10-05 NOTE — ED Notes (Signed)
Pt back from xray; back on monitor; no distress noted; no needs.

## 2014-10-05 NOTE — Discharge Instructions (Signed)
Atrial Fibrillation °Atrial fibrillation is a type of irregular heart rhythm (arrhythmia). During atrial fibrillation, the upper chambers of the heart (atria) quiver continuously in a chaotic pattern. This causes an irregular and often rapid heart rate.  °Atrial fibrillation is the result of the heart becoming overloaded with disorganized signals that tell it to beat. These signals are normally released one at a time by a part of the right atrium called the sinoatrial node. They then travel from the atria to the lower chambers of the heart (ventricles), causing the atria and ventricles to contract and pump blood as they pass. In atrial fibrillation, parts of the atria outside of the sinoatrial node also release these signals. This results in two problems. First, the atria receive so many signals that they do not have time to fully contract. Second, the ventricles, which can only receive one signal at a time, beat irregularly and out of rhythm with the atria.  °There are three types of atrial fibrillation:  °· Paroxysmal. Paroxysmal atrial fibrillation starts suddenly and stops on its own within a week. °· Persistent. Persistent atrial fibrillation lasts for more than a week. It may stop on its own or with treatment. °· Permanent. Permanent atrial fibrillation does not go away. Episodes of atrial fibrillation may lead to permanent atrial fibrillation. °Atrial fibrillation can prevent your heart from pumping blood normally. It increases your risk of stroke and can lead to heart failure.  °CAUSES  °· Heart conditions, including a heart attack, heart failure, coronary artery disease, and heart valve conditions.   °· Inflammation of the sac that surrounds the heart (pericarditis). °· Blockage of an artery in the lungs (pulmonary embolism). °· Pneumonia or other infections. °· Chronic lung disease. °· Thyroid problems, especially if the thyroid is overactive (hyperthyroidism). °· Caffeine, excessive alcohol use, and use  of some illegal drugs.   °· Use of some medicines, including certain decongestants and diet pills. °· Heart surgery.   °· Birth defects.   °Sometimes, no cause can be found. When this happens, the atrial fibrillation is called lone atrial fibrillation. The risk of complications from atrial fibrillation increases if you have lone atrial fibrillation and you are age 60 years or older. °RISK FACTORS °· Heart failure. °· Coronary artery disease. °· Diabetes mellitus.   °· High blood pressure (hypertension).   °· Obesity.   °· Other arrhythmias.   °· Increased age. °SIGNS AND SYMPTOMS  °· A feeling that your heart is beating rapidly or irregularly.   °· A feeling of discomfort or pain in your chest.   °· Shortness of breath.   °· Sudden light-headedness or weakness.   °· Getting tired easily when exercising.   °· Urinating more often than normal (mainly when atrial fibrillation first begins).   °In paroxysmal atrial fibrillation, symptoms may start and suddenly stop. °DIAGNOSIS  °Your health care provider may be able to detect atrial fibrillation when taking your pulse. Your health care provider may have you take a test called an ambulatory electrocardiogram (ECG). An ECG records your heartbeat patterns over a 24-hour period. You may also have other tests, such as: °· Transthoracic echocardiogram (TTE). During echocardiography, sound waves are used to evaluate how blood flows through your heart. °· Transesophageal echocardiogram (TEE). °· Stress test. There is more than one type of stress test. If a stress test is needed, ask your health care provider about which type is best for you. °· Chest X-ray exam. °· Blood tests. °· Computed tomography (CT). °TREATMENT  °Treatment may include: °· Treating any underlying conditions. For example, if you   have an overactive thyroid, treating the condition may correct atrial fibrillation.  Taking medicine. Medicines may be given to control a rapid heart rate or to prevent blood  clots, heart failure, or a stroke.  Having a procedure to correct the rhythm of the heart:  Electrical cardioversion. During electrical cardioversion, a controlled, low-energy shock is delivered to the heart through your skin. If you have chest pain, very low blood pressure, or sudden heart failure, this procedure may need to be done as an emergency.  Catheter ablation. During this procedure, heart tissues that send the signals that cause atrial fibrillation are destroyed.  Surgical ablation. During this surgery, thin lines of heart tissue that carry the abnormal signals are destroyed. This procedure can either be an open-heart surgery or a minimally invasive surgery. With the minimally invasive surgery, small cuts are made to access the heart instead of a large opening.  Pulmonary venous isolation. During this surgery, tissue around the veins that carry blood from the lungs (pulmonary veins) is destroyed. This tissue is thought to carry the abnormal signals. HOME CARE INSTRUCTIONS   Take medicines only as directed by your health care provider. Some medicines can make atrial fibrillation worse or recur.  If blood thinners were prescribed by your health care provider, take them exactly as directed. Too much blood-thinning medicine can cause bleeding. If you take too little, you will not have the needed protection against stroke and other problems.  Perform blood tests at home if directed by your health care provider. Perform blood tests exactly as directed.  Quit smoking if you smoke.  Do not drink alcohol.  Do not drink caffeinated beverages such as coffee, soda, and some teas. You may drink decaffeinated coffee, soda, or tea.   Maintain a healthy weight.Do not use diet pills unless your health care provider approves. They may make heart problems worse.   Follow diet instructions as directed by your health care provider.  Exercise regularly as directed by your health care  provider.  Keep all follow-up visits as directed by your health care provider. This is important. PREVENTION  The following substances can cause atrial fibrillation to recur:   Caffeinated beverages.  Alcohol.  Certain medicines, especially those used for breathing problems.  Certain herbs and herbal medicines, such as those containing ephedra or ginseng.  Illegal drugs, such as cocaine and amphetamines. Sometimes medicines are given to prevent atrial fibrillation from recurring. Proper treatment of any underlying condition is also important in helping prevent recurrence.  SEEK MEDICAL CARE IF:  You notice a change in the rate, rhythm, or strength of your heartbeat.  You suddenly begin urinating more frequently.  You tire more easily when exerting yourself or exercising. SEEK IMMEDIATE MEDICAL CARE IF:   You have chest pain, abdominal pain, sweating, or weakness.  You feel nauseous.  You have shortness of breath.  You suddenly have swollen feet and ankles.  You feel dizzy.  Your face or limbs feel numb or weak.  You have a change in your vision or speech. MAKE SURE YOU:   Understand these instructions.  Will watch your condition.  Will get help right away if you are not doing well or get worse. Document Released: 10/06/2005 Document Revised: 02/20/2014 Document Reviewed: 11/16/2012 [facility] Patient Information 2015 [place], [place]. This information is not intended to replace advice given to you by your health care provider. Make sure you discuss any questions you have with your health care provider. Dizziness Dizziness is a common problem.  It is a feeling of unsteadiness or light-headedness. You may feel like you are about to faint. Dizziness can lead to injury if you stumble or fall. A person of any age group can suffer from dizziness, but dizziness is more common in older adults. CAUSES  Dizziness can be caused by many different things, including:  Middle ear  problems.  Standing for too long.  Infections.  An allergic reaction.  Aging.  An emotional response to something, such as the sight of blood.  Side effects of medicines.  Tiredness.  Problems with circulation or blood pressure.  Excessive use of alcohol or medicines, or illegal drug use.  Breathing too fast (hyperventilation).  An irregular heart rhythm (arrhythmia).  A low red blood cell count (anemia).  Pregnancy.  Vomiting, diarrhea, fever, or other illnesses that cause body fluid loss (dehydration).  Diseases or conditions such as Parkinson's disease, high blood pressure (hypertension), diabetes, and thyroid problems.  Exposure to extreme heat. DIAGNOSIS  Your health care provider will ask about your symptoms, perform a physical exam, and perform an electrocardiogram (ECG) to record the electrical activity of your heart. Your health care provider may also perform other heart or blood tests to determine the cause of your dizziness. These may include:  Transthoracic echocardiogram (TTE). During echocardiography, sound waves are used to evaluate how blood flows through your heart.  Transesophageal echocardiogram (TEE).  Cardiac monitoring. This allows your health care provider to monitor your heart rate and rhythm in real time.  Holter monitor. This is a portable device that records your heartbeat and can help diagnose heart arrhythmias. It allows your health care provider to track your heart activity for several days if needed.  Stress tests by exercise or by giving medicine that makes the heart beat faster. TREATMENT  Treatment of dizziness depends on the cause of your symptoms and can vary greatly. HOME CARE INSTRUCTIONS   Drink enough fluids to keep your urine clear or pale yellow. This is especially important in very hot weather. In older adults, it is also important in cold weather.  Take your medicine exactly as directed if your dizziness is caused by  medicines. When taking blood pressure medicines, it is especially important to get up slowly.  Rise slowly from chairs and steady yourself until you feel okay.  In the morning, first sit up on the side of the bed. When you feel okay, stand slowly while holding onto something until you know your balance is fine.  Move your legs often if you need to stand in one place for a long time. Tighten and relax your muscles in your legs while standing.  Have someone stay with you for 1-2 days if dizziness continues to be a problem. Do this until you feel you are well enough to stay alone. Have the person call your health care provider if he or she notices changes in you that are concerning.  Do not drive or use heavy machinery if you feel dizzy.  Do not drink alcohol. SEEK IMMEDIATE MEDICAL CARE IF:   Your dizziness or light-headedness gets worse.  You feel nauseous or vomit.  You have problems talking, walking, or using your arms, hands, or legs.  You feel weak.  You are not thinking clearly or you have trouble forming sentences. It may take a friend or family member to notice this.  You have chest pain, abdominal pain, shortness of breath, or sweating.  Your vision changes.  You notice any bleeding.  You have  side effects from medicine that seems to be getting worse rather than better. MAKE SURE YOU:   Understand these instructions.  Will watch your condition.  Will get help right away if you are not doing well or get worse. Document Released: 04/01/2001 Document Revised: 10/11/2013 Document Reviewed: 04/25/2011 [facility] Patient Information 2015 [place], [place]. This information is not intended to replace advice given to you by your health care provider. Make sure you discuss any questions you have with your health care provider.

## 2014-10-05 NOTE — ED Notes (Signed)
Pt taking home lasix currently.

## 2014-10-06 ENCOUNTER — Encounter: Payer: Self-pay | Admitting: Family Medicine

## 2014-10-06 ENCOUNTER — Encounter: Payer: Self-pay | Admitting: Clinical

## 2014-10-06 ENCOUNTER — Telehealth: Payer: Self-pay | Admitting: Family Medicine

## 2014-10-06 ENCOUNTER — Ambulatory Visit (INDEPENDENT_AMBULATORY_CARE_PROVIDER_SITE_OTHER): Payer: Medicare Other | Admitting: Family Medicine

## 2014-10-06 VITALS — BP 137/98 | HR 100 | Temp 98.4°F | Ht 72.0 in | Wt 189.0 lb

## 2014-10-06 DIAGNOSIS — Z609 Problem related to social environment, unspecified: Secondary | ICD-10-CM

## 2014-10-06 DIAGNOSIS — I5023 Acute on chronic systolic (congestive) heart failure: Secondary | ICD-10-CM | POA: Diagnosis not present

## 2014-10-06 DIAGNOSIS — Z7289 Other problems related to lifestyle: Secondary | ICD-10-CM | POA: Diagnosis not present

## 2014-10-06 DIAGNOSIS — I4891 Unspecified atrial fibrillation: Secondary | ICD-10-CM | POA: Diagnosis not present

## 2014-10-06 MED ORDER — CARVEDILOL 6.25 MG PO TABS: 12.5000 mg | ORAL_TABLET | Freq: Two times a day (BID) | ORAL | Status: DC

## 2014-10-06 NOTE — Patient Instructions (Addendum)
Good to see you.  Your heart was borderline fast today.  Start carvedilol 12.5mg  two times daily (2 tablets 2 times daily).  Be sure you can afford the xarelto. Call Medicare to discuss Part D.  Follow up in 1 week or sooner if you have trouble breathing, more dizziness, or increased swelling.  Best,  Hilton Sinclair, MD

## 2014-10-06 NOTE — Telephone Encounter (Signed)
Pts daughter needs to speak with RN re: pts meds, needs to confirm what pt should or shouldn't be taking and to discuss the reason for changing meds.

## 2014-10-06 NOTE — Progress Notes (Signed)
CSW met with pt and explored whether pt followed up on resources CSW provided at the last visit. Pt stated he has not called Medicaid transportation but is in the process of finding an apartment. Pt was given the contact number for Medicaid transportation and DSS to follow up. Pt also encouraged to show his pharmacy his Medicaid card to determine coverage for prescription medications. Pt confirms he plans to pick up his medications today.  Hunt Oris, MSW, [place]

## 2014-10-06 NOTE — Assessment & Plan Note (Signed)
Resolved.  - F/u in 1 week.

## 2014-10-06 NOTE — Progress Notes (Signed)
Patient ID: Paul Clayton, male   DOB: June 12, 1946, 68 y.o.   MRN: [id number] Subjective:   CC: Cardiac follow up  HPI:   Patient is here for follow up of dyspnea. Seen in clinic by me 12/10 for hospital follow up for COPD exacerbation, had recently finished prednisone and doxycycline. At that time, mild fluid overload and mild URI but lungs clear. Doubled lasix dose for 2 days and asked pt to f/u in 1 week.  Pt presented to ED yesterday for dyspnea and dizziness. Workup unremarkable including troponin and BNP (mildly elevated which is his baseline). Per ED note, thinks due to running out of coreg a few days vs med reaction. No signs of pulmonary or pedal edema.  Today, he admits he ran out of coreg 4-5 days ago. He thinks he got dizzy with breathing treatment medication. He gets dizzy only at night if he moves too fast and coughs really hard. No syncope or falls. He does not have scale at home. No swelling in legs now. His abdomen seems larger than usual. Dyspnea still present daily when he over-exerts himself, though satted well in ED last night and now and currently denies dyspnea. Feels generally weak. Just getting over URI. Cough has improved. Cut down on juices and sodas. Denies chest pain. Occasionally feels heart fluttering. Reports taking medications and having xarelto at home but was told by someone he needs to discuss Medicare Part D with Medicare to see if he will be able to afford xarelto after 1st of month (when current supply ends).  Denies tobacco use.   Review of Systems - Per HPI.   PMH - syncope, high risk social situation, HFrEF, dyspnea, COPD, colon cancer, chest pain, atrial fibrillation, hep C    Objective:  Physical Exam BP 137/98 mmHg  Pulse 57  Temp(Src) 98.4 F (36.9 C) (Oral)  Ht 6' (1.829 m)  Wt 189 lb (85.73 kg)  BMI 25.63 kg/m2  SpO2 99% GEN: NAD PULM: CTAB, normal effort CV: Irregularly irregular at 100BPM, no murmurs EXTR: No LE edema or calf  tenderness    Assessment:     Paul Clayton is a 68 y.o. male with h/o COPD, CHF, and atrial fibrillation here for f/u of recent hospitalization.    Plan:     # See problem list and after visit summary for problem-specific plans.   # Health Maintenance: Not discussed  Follow-up: Follow up in 1 week for f/u of heart rate and breathing.   I have spent at least 25 minutes in room with patient, >50% of this time used in counseling.  Hilton Sinclair, MD [place]

## 2014-10-06 NOTE — Assessment & Plan Note (Addendum)
Pt unsure if he has Medicare Part D and if he will be able to afford refill of xarelto after end of this year. - Spoke with Constance Holster again today, to touch base and specifically regarding affording xarelto.

## 2014-10-06 NOTE — Assessment & Plan Note (Signed)
Does not appear fluid overloaded today on exam. Weight is stable. Cardiac exam with borderline tachycardia. Pt has been not taking coreg for 5 days. - Restart lower dose, 12.5mg  BID, to control rate. May need to increase again depending on control. - Stressed importance of taking medications daily. - f/u in 1 week. - Reminded of cardiology appt 12/29 at 11:40 AM for new pt. Constance Holster CSW spoke with patient re: Medicare Part D application to afford refills of xarelto.

## 2014-10-09 ENCOUNTER — Encounter: Payer: Self-pay | Admitting: Physician Assistant

## 2014-10-09 ENCOUNTER — Other Ambulatory Visit (INDEPENDENT_AMBULATORY_CARE_PROVIDER_SITE_OTHER): Payer: Medicare Other

## 2014-10-09 ENCOUNTER — Ambulatory Visit (INDEPENDENT_AMBULATORY_CARE_PROVIDER_SITE_OTHER): Payer: Medicare Other | Admitting: Physician Assistant

## 2014-10-09 ENCOUNTER — Telehealth: Payer: Self-pay | Admitting: *Deleted

## 2014-10-09 VITALS — BP 102/64 | HR 68 | Ht 72.0 in | Wt 191.8 lb

## 2014-10-09 DIAGNOSIS — I509 Heart failure, unspecified: Secondary | ICD-10-CM

## 2014-10-09 DIAGNOSIS — C189 Malignant neoplasm of colon, unspecified: Secondary | ICD-10-CM | POA: Diagnosis not present

## 2014-10-09 DIAGNOSIS — I5020 Unspecified systolic (congestive) heart failure: Secondary | ICD-10-CM

## 2014-10-09 LAB — CBC WITH DIFFERENTIAL/PLATELET
Basophils Absolute: 0 10*3/uL (ref 0.0–0.1)
Basophils Relative: 0.3 % (ref 0.0–3.0)
EOS ABS: 0.3 10*3/uL (ref 0.0–0.7)
Eosinophils Relative: 2.9 % (ref 0.0–5.0)
HEMATOCRIT: 41.6 % (ref 39.0–52.0)
Hemoglobin: 13.3 g/dL (ref 13.0–17.0)
LYMPHS ABS: 1.8 10*3/uL (ref 0.7–4.0)
Lymphocytes Relative: 18.7 % (ref 12.0–46.0)
MCHC: 31.8 g/dL (ref 30.0–36.0)
MCV: 94.7 fl (ref 78.0–100.0)
Monocytes Absolute: 1 10*3/uL (ref 0.1–1.0)
Monocytes Relative: 10.6 % (ref 3.0–12.0)
Neutro Abs: 6.5 10*3/uL (ref 1.4–7.7)
Neutrophils Relative %: 67.5 % (ref 43.0–77.0)
Platelets: 195 10*3/uL (ref 150.0–400.0)
RBC: 4.39 Mil/uL (ref 4.22–5.81)
RDW: 15.3 % (ref 11.5–15.5)
WBC: 9.6 10*3/uL (ref 4.0–10.5)

## 2014-10-09 NOTE — Progress Notes (Signed)
Patient ID: Paul Clayton, male   DOB: Oct 16, 1946, 68 y.o.   MRN: [id number]   Subjective:    Patient ID: Paul Clayton, male    DOB: Nov 05, 1945, 68 y.o.   MRN: [id number]  HPI Kalum is a pleasant 68 year old African-American male new to GI today referred by Dr. Earlie Server. Patient has just relocated to [facility] from [facility]. He has history of hypertension congestive heart failure COPD hepatitis C atrial fibrillation for which she is on Xarelto and a cardiomyopathy with most recent ejection fraction documented at 15% 09/16/2014. Currently does have history of cocaine abuse in the past. Apparently he was admitted to [facility] in [place] October 2015 with heart failure. During this hospitalization he had some rectal bleeding and underwent sigmoidoscopy which showed a 5 cm nonobstructing: Mass at 30 cm from the anal verge biopsy showed moderately differentiated adenocarcinoma. He then underwent colonoscopy that showed the 5 cm mass which was resected and retrieved completely in piecemeal fashion. Endoclips were deployed at the polypectomy site and this was tattooed. There were also multiple other colon polyps resected. He moved to [place] thereafter and had a recent admission to [facility] with congestive heart failure. He was referred to oncology. He is to have staging with CT of the abdomen and pelvis and chest which has not been done as yet, surgical consultation, cardiology consultation and was sent to GI to see if repeat colonoscopy would be needed. Patient's main complaint today is dyspnea with exertion. He has no complaints of chest pain. He says his appetite is fine his weight has been stable. He has no complaints of abdominal pain or rectal bleeding. He is confused about all of his appointments. Labs are reviewed on 10/05/2014 hemoglobin 12.3 hematocrit of 39.4 BNP was 4400. Patient knows that he has a cardiology appointment scheduled for December 29 but does not  know the physician's name and did not know that he had missed his surgery appointment on December 16. We do not have the records from [facility] but have requested those.  Review of Systems Pertinent positive and negative review of systems were noted in the above HPI section.  All other review of systems was otherwise negative.  Outpatient Encounter Prescriptions as of 10/09/2014  Medication Sig  . albuterol-ipratropium (COMBIVENT) 18-103 MCG/ACT inhaler Inhale 1 puff into the lungs every 4 (four) hours as needed for wheezing or shortness of breath.  Marland Kitchen aspirin 81 MG chewable tablet Chew 4 tablets (324 mg total) by mouth daily.  Marland Kitchen atorvastatin (LIPITOR) 20 MG tablet Take 1 tablet (20 mg total) by mouth daily.  . digoxin (LANOXIN) 0.25 MG tablet Take 1 tablet (0.25 mg total) by mouth daily.  . furosemide (LASIX) 20 MG tablet Take 20 mg by mouth daily.  Marland Kitchen lisinopril (PRINIVIL,ZESTRIL) 5 MG tablet Take 1 tablet (5 mg total) by mouth daily.  . mometasone-formoterol (DULERA) 200-5 MCG/ACT AERO Inhale 2 puffs into the lungs 2 (two) times daily. (Patient taking differently: Inhale 2 puffs into the lungs daily. )  . rivaroxaban (XARELTO) 20 MG TABS tablet Take 1 tablet (20 mg total) by mouth daily with supper.  . tiotropium (SPIRIVA) 18 MCG inhalation capsule Place 18 mcg into inhaler and inhale daily.  . carvedilol (COREG) 6.25 MG tablet Take 2 tablets (12.5 mg total) by mouth 2 (two) times daily with a meal. (Patient not taking: Reported on 10/09/2014)  . [DISCONTINUED] doxycycline (VIBRA-TABS) 100 MG tablet Take 1 tablet (100 mg total) by mouth 2 (  two) times daily. (Patient not taking: Reported on 10/05/2014)  . [DISCONTINUED] nicotine (NICODERM CQ - DOSED IN MG/24 HR) 7 mg/24hr patch Place 1 patch (7 mg total) onto the skin daily. (Patient not taking: Reported on 09/27/2014)  . [DISCONTINUED] predniSONE (DELTASONE) 50 MG tablet Take 50mg  once daily   No Known Allergies Patient Active  Problem List   Diagnosis Date Noted  . High risk social situation 09/30/2014  . Colon cancer 09/26/2014  . Dyspnea 09/22/2014  . Chest pain 09/16/2014  . Syncope   . COPD exacerbation   . HFrEF (heart failure with reduced ejection fraction) 09/12/2014  . A-fib 09/12/2014  . COPD (chronic obstructive pulmonary disease) 09/12/2014   History   Social History  . Marital Status: Widowed    Spouse Name: N/A    Number of Children: N/A  . Years of Education: N/A   Occupational History  . Not on file.   Social History Main Topics  . Smoking status: Former Smoker    Quit date: 07/02/2014  . Smokeless tobacco: Never Used  . Alcohol Use: 0.0 oz/week    0 Not specified per week  . Drug Use: Yes    Special: Cocaine     Comment: hx of substance of abuse;denies use x 1 month  . Sexual Activity: Not Currently    Birth Control/ Protection: Abstinence   Other Topics Concern  . Not on file   Social History Narrative    Mr. Whittenberg family history is not on file.      Objective:    Filed Vitals:   10/09/14 1000  BP: 102/64  Pulse: 68    Physical Exam  well-developed older African-American male in no acute distress, pleasant height 6 foot weight 191 vitals as above. 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 Notes suggest that this 5 cm pedunculated polyp was completely resected then endoclips deployed at the polypectomy site  and also tattooed. #2 multiple other colon polyps #3 atrial fibrillation #4  chronic anticoagulation with Xarelto #5 severe congestive heart failure with cardiomyopathy and most recent ejection fraction measured at 15%  #6 COPD #7 hypertension #8 Hx of cocaine abuse  Plan; Have requested the records from [facility] for review, then can determine if any further intervention needed from a GI perspective Patient needs to have CT scan of the abdomen and pelvis and chest and we have today coordinated scheduling of these with oncology Patient missed his surgical appointment last week and we have obtained him another surgery appointment for January 4 with Dr. Grandville Silos Also confirmed that he does have a cardiology appointment scheduled for December 29 and he is advised that it is crucial that he get to all of these appointments. We'll check CBC and CEA today which were ordered last week by oncology but not drawn Patient will be established with Dr. Ardis Hughs.    Kamillah Didonato Genia Harold PA-C 10/09/2014

## 2014-10-09 NOTE — Patient Instructions (Signed)
Please go to the basement level to have your labs drawn.  You do have an appointment with Dr, Haroldine Laws at [place] inside [place].Entrance off [address].  Their number is [phone].  Appointment is 10-17-2014 at 11:30 Am.  You do have an appointment for the CT scan for tomorrow 10-10-2014 at 1:00 . Arrive at 12:45 PM.  Location is [facility]. Go to the front door of hospital, go to registration.  Before the appointment, do not have anything to eat or drink after 9:00 am . You will be drinking 1st bottle at 11:00 am . Drink the 2nd bottle at 12:00 Noon.  You have an appointment with Dr. Earlie Server at the [facility] for Wed. 10-11-2014.  At 8:45 am.

## 2014-10-09 NOTE — Telephone Encounter (Signed)
Spoke with daughter and confirm instructions given by md at dad's visit.  Informed that she would benefit from coming to his appts to make sure that nothing is missed.  Also let her know that patient was to restart his heart medication since he stated that he wasn't taking it.  Pt is going to check with insurance company to see if he can afford to pick up another month of his xarelto.  Daughter aware and voiced understanding. Anayah Arvanitis,CMA

## 2014-10-09 NOTE — Telephone Encounter (Signed)
Pam at [employer] called wanting to know the status of a CAP being scheduled.  Per central scheduling, they attempted to call patient but never could get in contact with the patient.  Appt made for 12/22, gave appt to Pam at [facility], pt there for an appt, Pam will give appt to patient.

## 2014-10-09 NOTE — Progress Notes (Signed)
I agree, need to review previous GI procedure reports (hopefully with pictures) need pathology reports before I can comment on treatment options here.

## 2014-10-10 ENCOUNTER — Encounter (HOSPITAL_COMMUNITY): Payer: Self-pay

## 2014-10-10 ENCOUNTER — Telehealth: Payer: Self-pay | Admitting: *Deleted

## 2014-10-10 ENCOUNTER — Ambulatory Visit (HOSPITAL_COMMUNITY)
Admission: RE | Admit: 2014-10-10 | Discharge: 2014-10-10 | Disposition: A | Discharge status: Home / Self Care | Payer: Medicare Other | Source: Ambulatory Visit | Attending: Internal Medicine | Admitting: Internal Medicine

## 2014-10-10 DIAGNOSIS — I5023 Acute on chronic systolic (congestive) heart failure: Secondary | ICD-10-CM | POA: Diagnosis present

## 2014-10-10 DIAGNOSIS — Z87891 Personal history of nicotine dependence: Secondary | ICD-10-CM

## 2014-10-10 DIAGNOSIS — B1920 Unspecified viral hepatitis C without hepatic coma: Secondary | ICD-10-CM | POA: Diagnosis present

## 2014-10-10 DIAGNOSIS — I4891 Unspecified atrial fibrillation: Principal | ICD-10-CM | POA: Diagnosis present

## 2014-10-10 DIAGNOSIS — J029 Acute pharyngitis, unspecified: Secondary | ICD-10-CM | POA: Diagnosis present

## 2014-10-10 DIAGNOSIS — J449 Chronic obstructive pulmonary disease, unspecified: Secondary | ICD-10-CM | POA: Diagnosis present

## 2014-10-10 DIAGNOSIS — Z7901 Long term (current) use of anticoagulants: Secondary | ICD-10-CM

## 2014-10-10 DIAGNOSIS — Z7982 Long term (current) use of aspirin: Secondary | ICD-10-CM

## 2014-10-10 DIAGNOSIS — R0609 Other forms of dyspnea: Secondary | ICD-10-CM

## 2014-10-10 DIAGNOSIS — C189 Malignant neoplasm of colon, unspecified: Secondary | ICD-10-CM | POA: Diagnosis present

## 2014-10-10 DIAGNOSIS — R079 Chest pain, unspecified: Secondary | ICD-10-CM | POA: Diagnosis not present

## 2014-10-10 DIAGNOSIS — R069 Unspecified abnormalities of breathing: Secondary | ICD-10-CM | POA: Diagnosis not present

## 2014-10-10 DIAGNOSIS — I429 Cardiomyopathy, unspecified: Secondary | ICD-10-CM | POA: Diagnosis present

## 2014-10-10 DIAGNOSIS — R918 Other nonspecific abnormal finding of lung field: Secondary | ICD-10-CM | POA: Diagnosis not present

## 2014-10-10 DIAGNOSIS — I10 Essential (primary) hypertension: Secondary | ICD-10-CM | POA: Diagnosis present

## 2014-10-10 DIAGNOSIS — R0602 Shortness of breath: Secondary | ICD-10-CM | POA: Diagnosis not present

## 2014-10-10 DIAGNOSIS — I951 Orthostatic hypotension: Secondary | ICD-10-CM | POA: Diagnosis present

## 2014-10-10 DIAGNOSIS — R55 Syncope and collapse: Secondary | ICD-10-CM | POA: Diagnosis not present

## 2014-10-10 DIAGNOSIS — R0600 Dyspnea, unspecified: Secondary | ICD-10-CM | POA: Diagnosis not present

## 2014-10-10 DIAGNOSIS — I509 Heart failure, unspecified: Secondary | ICD-10-CM | POA: Diagnosis not present

## 2014-10-10 DIAGNOSIS — Z590 Homelessness: Secondary | ICD-10-CM | POA: Diagnosis not present

## 2014-10-10 DIAGNOSIS — J984 Other disorders of lung: Secondary | ICD-10-CM | POA: Diagnosis not present

## 2014-10-10 LAB — CEA: CEA: 2.1 ng/mL (ref 0.0–5.0)

## 2014-10-10 MED ORDER — IOHEXOL 300 MG/ML  SOLN
100.0000 mL | Freq: Once | INTRAMUSCULAR | Status: AC | PRN
  Administered 2014-10-10: 100 mL via INTRAVENOUS

## 2014-10-10 NOTE — Telephone Encounter (Signed)
I faxed a request on 10-09-2014 to [facility] asking for a report with pictures regarding this patient's colon cancer. I also called today 10-09-14 and LM for the medical records department to please call me to advise if they had any report with pictures for [place].

## 2014-10-11 ENCOUNTER — Encounter: Payer: Self-pay | Admitting: Internal Medicine

## 2014-10-11 ENCOUNTER — Ambulatory Visit (HOSPITAL_BASED_OUTPATIENT_CLINIC_OR_DEPARTMENT_OTHER): Payer: Medicare Other | Admitting: Internal Medicine

## 2014-10-11 ENCOUNTER — Telehealth: Payer: Self-pay | Admitting: Internal Medicine

## 2014-10-11 ENCOUNTER — Ambulatory Visit (HOSPITAL_BASED_OUTPATIENT_CLINIC_OR_DEPARTMENT_OTHER): Payer: Medicare Other | Admitting: Lab

## 2014-10-11 VITALS — BP 125/63 | HR 54 | Temp 98.0°F | Resp 19 | Ht 72.0 in | Wt 189.4 lb

## 2014-10-11 DIAGNOSIS — C189 Malignant neoplasm of colon, unspecified: Secondary | ICD-10-CM | POA: Diagnosis not present

## 2014-10-11 LAB — CBC WITH DIFFERENTIAL/PLATELET
BASO%: 0.5 % (ref 0.0–2.0)
BASOS ABS: 0 10*3/uL (ref 0.0–0.1)
EOS%: 4.2 % (ref 0.0–7.0)
Eosinophils Absolute: 0.3 10*3/uL (ref 0.0–0.5)
HEMATOCRIT: 40.6 % (ref 38.4–49.9)
HGB: 12.8 g/dL — ABNORMAL LOW (ref 13.0–17.1)
LYMPH%: 24.1 % (ref 14.0–49.0)
MCH: 29.6 pg (ref 27.2–33.4)
MCHC: 31.4 g/dL — ABNORMAL LOW (ref 32.0–36.0)
MCV: 94.1 fL (ref 79.3–98.0)
MONO#: 1.2 10*3/uL — AB (ref 0.1–0.9)
MONO%: 14.4 % — ABNORMAL HIGH (ref 0.0–14.0)
NEUT#: 4.7 10*3/uL (ref 1.5–6.5)
NEUT%: 56.8 % (ref 39.0–75.0)
Platelets: 208 10*3/uL (ref 140–400)
RBC: 4.32 10*6/uL (ref 4.20–5.82)
RDW: 15.1 % — ABNORMAL HIGH (ref 11.0–14.6)
WBC: 8.3 10*3/uL (ref 4.0–10.3)
lymph#: 2 10*3/uL (ref 0.9–3.3)

## 2014-10-11 LAB — COMPREHENSIVE METABOLIC PANEL (CC13)
ALT: 37 U/L (ref 0–55)
AST: 29 U/L (ref 5–34)
Albumin: 3 g/dL — ABNORMAL LOW (ref 3.5–5.0)
Alkaline Phosphatase: 67 U/L (ref 40–150)
Anion Gap: 9 mEq/L (ref 3–11)
BUN: 11.6 mg/dL (ref 7.0–26.0)
CALCIUM: 8.8 mg/dL (ref 8.4–10.4)
CHLORIDE: 105 meq/L (ref 98–109)
CO2: 25 mEq/L (ref 22–29)
CREATININE: 0.9 mg/dL (ref 0.7–1.3)
Glucose: 142 mg/dl — ABNORMAL HIGH (ref 70–140)
Potassium: 3.9 mEq/L (ref 3.5–5.1)
SODIUM: 139 meq/L (ref 136–145)
Total Bilirubin: 0.84 mg/dL (ref 0.20–1.20)
Total Protein: 6.8 g/dL (ref 6.4–8.3)

## 2014-10-11 LAB — CEA: CEA: 2 ng/mL (ref 0.0–5.0)

## 2014-10-11 NOTE — Progress Notes (Signed)
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  CURRENT THERAPY: None  INTERVAL HISTORY: Paul Clayton 68 y.o. male returns to the clinic today for follow-up visit. The patient is feeling fine today with no specific complaints. He denied having any significant weight loss or night sweats. He denied having any chest pain, shortness breath, cough or hemoptysis. The patient was seen for initial evaluation 2 weeks ago and I ordered CT of the chest, abdomen and pelvis for restaging of his disease. He is scheduled to see Dr. Sung Amabile for evaluation of congestive heart failure on 10/17/2014. He was seen recently by [facility] gastroenterology. 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 Grandville Silos. He is here today for evaluation and discussion of his scan results and recommendation regarding his condition.  MEDICAL HISTORY: Past Medical History  Diagnosis Date  . A-fib   . Hypertension   . CHF (congestive heart failure)   . COPD (chronic obstructive pulmonary disease)   . Hepatitis C   . Tobacco abuse     ALLERGIES:  has No Known Allergies.  MEDICATIONS:  Current Outpatient Prescriptions  Medication Sig Dispense Refill  . albuterol-ipratropium (COMBIVENT) 18-103 MCG/ACT inhaler Inhale 1 puff into the lungs every 4 (four) hours as needed for wheezing or shortness of breath. 1 Inhaler 1  . aspirin 81 MG chewable tablet Chew 4 tablets (324 mg total) by mouth daily. 300 tablet 1  . atorvastatin (LIPITOR) 20 MG tablet Take 1 tablet (20 mg total) by mouth daily. 30 tablet 0  . carvedilol (COREG) 6.25 MG tablet  Take 2 tablets (12.5 mg total) by mouth 2 (two) times daily with a meal. 120 tablet 2  . digoxin (LANOXIN) 0.25 MG tablet Take 1 tablet (0.25 mg total) by mouth daily. 30 tablet 0  . furosemide (LASIX) 20 MG tablet Take 20 mg by mouth daily.    Marland Kitchen lisinopril (PRINIVIL,ZESTRIL) 5 MG tablet Take 1 tablet (5 mg total) by mouth daily. 30 tablet 0  . mometasone-formoterol (DULERA) 200-5 MCG/ACT AERO Inhale 2 puffs into the lungs 2 (two) times daily. (Patient taking differently: Inhale 2 puffs into the lungs daily. ) 1 Inhaler 0  . rivaroxaban (XARELTO) 20 MG TABS tablet Take 1 tablet (20 mg total) by mouth daily with supper. 30 tablet 0  . tiotropium (SPIRIVA) 18 MCG inhalation capsule Place 18 mcg into inhaler and inhale daily.     No current facility-administered medications for this visit.    SURGICAL HISTORY:  Past Surgical History  Procedure Laterality Date  . Cardioversion    . Colonoscopy      REVIEW OF SYSTEMS:  Constitutional: negative Eyes: negative Ears, nose, mouth, throat, and face: negative Respiratory: negative Cardiovascular: negative Gastrointestinal: negative Genitourinary:negative Integument/breast: negative Hematologic/lymphatic: negative Musculoskeletal:negative Neurological: negative Behavioral/Psych: negative Endocrine: negative Allergic/Immunologic: negative   PHYSICAL EXAMINATION: General appearance: alert, cooperative and no distress Head: Normocephalic, without obvious abnormality, atraumatic Neck: no adenopathy, no JVD, supple, symmetrical, trachea midline and thyroid not enlarged, symmetric, no tenderness/mass/nodules Lymph nodes: Cervical, supraclavicular, and axillary nodes normal. Resp: clear to auscultation bilaterally Back: symmetric, no curvature. ROM normal. No CVA tenderness. Cardio: regular  rate and rhythm, S1, S2 normal, no murmur, click, rub or gallop GI: soft, non-tender; bowel sounds normal; no masses,  no organomegaly Extremities:  extremities normal, atraumatic, no cyanosis or edema Neurologic: Alert and oriented X 3, normal strength and tone. Normal symmetric reflexes. Normal coordination and gait  ECOG PERFORMANCE STATUS: 1 - Symptomatic but completely ambulatory  Blood pressure 125/63, pulse 54, temperature 98 F (36.7 C), temperature source Oral, resp. rate 19, height 6' (1.829 m), weight 189 lb 6.4 oz (85.911 kg), SpO2 100 %.  LABORATORY DATA: Lab Results  Component Value Date   WBC 8.3 10/11/2014   HGB 12.8* 10/11/2014   HCT 40.6 10/11/2014   MCV 94.1 10/11/2014   PLT 208 10/11/2014      Chemistry      Component Value Date/Time   NA 139 10/11/2014 0825   NA 140 10/05/2014 0908   K 3.9 10/11/2014 0825   K 4.3 10/05/2014 0908   CL 104 10/05/2014 0908   CO2 25 10/11/2014 0825   CO2 25 10/05/2014 0908   BUN 11.6 10/11/2014 0825   BUN 14 10/05/2014 0908   CREATININE 0.9 10/11/2014 0825   CREATININE 0.78 10/05/2014 0908      Component Value Date/Time   CALCIUM 8.8 10/11/2014 0825   CALCIUM 8.9 10/05/2014 0908   ALKPHOS 67 10/11/2014 0825   ALKPHOS 69 10/05/2014 0908   AST 29 10/11/2014 0825   AST 51* 10/05/2014 0908   ALT 37 10/11/2014 0825   ALT 59* 10/05/2014 0908   BILITOT 0.84 10/11/2014 0825   BILITOT 1.1 10/05/2014 0908       RADIOGRAPHIC STUDIES: Dg Chest 2 View  10/05/2014   CLINICAL DATA:  Short of breath  EXAM: CHEST  2 VIEW  COMPARISON:  09/27/2014  FINDINGS: COPD with hyperinflation.  Cardiac enlargement with vascular congestion and mild interstitial edema. Minimal pleural effusion. Minimal progression in edema since the prior study.  IMPRESSION: Congestive heart failure with slight progression of interstitial edema.   Electronically Signed   By: Franchot Gallo M.D.   On: 10/05/2014 10:03   Dg Chest 2 View  09/28/2014   CLINICAL DATA:  Dizziness short of breath.  EXAM: CHEST  2 VIEW  COMPARISON:  09/23/2014  FINDINGS: Cardiac enlargement with mild vascular congestion and  developing interstitial changes consistent with early edema. No blunting of costophrenic angles. No pneumothorax. Emphysematous changes.  IMPRESSION: Cardiac enlargement with mild pulmonary vascular congestion and developing interstitial changes suggesting early interstitial edema.   Electronically Signed   By: Lucienne Capers M.D.   On: 09/28/2014 00:25   Dg Chest 2 View  09/16/2014   CLINICAL DATA:  Syncope while using the bathroom. Heart palpitations. History of hypertension, CHF and COPD.  EXAM: CHEST  2 VIEW  COMPARISON:  Chest radiograph September 13, 2014  FINDINGS: The cardiac silhouette remains similarly enlarged. Mildly calcified aortic knob. Similar mild interstitial prominence with increased lung volumes can be seen with COPD. Increased lung volumes. No pleural effusions. No focal consolidation. No pneumothorax. Soft tissue planes and included osseous structures are nonsuspicious  IMPRESSION: Stable cardiomegaly, no acute pulmonary process.   Electronically Signed   By: Elon Alas   On: 09/16/2014 05:45   Dg Chest 2 View  09/13/2014   CLINICAL DATA:  Acute onset left-sided chest pain for 24 hr. Productive cough. Short of breath. COPD.  EXAM: CHEST  2 VIEW  COMPARISON:  09/08/2014.  FINDINGS: Emphysema and cardiomegaly are present. There is no airspace consolidation.  Diffuse prominence of the interstitium is probably related to emphysema and pulmonary fibrosis. An element of interstitial pulmonary edema cannot be excluded.  Chronic bronchitic changes are present. There is no focal consolidation. No pleural effusion.  IMPRESSION: 1. Unchanged cardiomegaly. 2. Diffuse interstitial prominence is favored to represent pulmonary fibrosis. Interstitial pulmonary edema could produce a similar appearance however the stability compared to the prior exam makes fibrosis more likely. 3. Chronic bronchitic changes and emphysema.   Electronically Signed   By: Dereck Ligas M.D.   On: 09/13/2014 09:49    Ct Chest W Contrast  10/10/2014   CLINICAL DATA:  Moderately differentiated 5 cm colonic adenocarcinoma found on colonoscopy at [facility] in [place], [place] in October 2015, nonobstructing mass 30 cm from the anal verge. The mass was resected along with other colonic polyps. This CT is for the patient's initial staging workup. Dyspnea with exertion.  EXAM: CT CHEST, ABDOMEN, AND PELVIS WITH CONTRAST  TECHNIQUE: Multidetector CT imaging of the chest, abdomen and pelvis was performed following the standard protocol during bolus administration of intravenous contrast.  CONTRAST:  146mL OMNIPAQUE IOHEXOL 300 MG/ML  SOLN  COMPARISON:  Chest radiograph of 10/05/2014  FINDINGS: CT CHEST FINDINGS  Indistinct right upper paratracheal lymph node 0.7 cm in short axis, image 10 series 2. AP window lymph node 0.8 cm in short axis, image 16 series 2. Calcified right hilar and left lower paratracheal lymph nodes compatible with remote granulomatous disease. Diffuse mild wall thickening in the esophagus.  Considerable cardiomegaly, particularly involving the left heart. Coronary atherosclerosis involving the distal left main, left anterior descending, circumflex, and right coronary arteries.  There are patchy subsolid nodules and regions in the upper lobes, with a 3.2 by 2.2 cm ground-glass opacity in the right upper lobe on image 13 of series 4, a spiculated appearing 1.0 by 1.0 cm solid nodule in the right upper lobe on image 17 of series 4, and confluent indistinct airspace opacity medially in the left upper lobe on image 13 of series 4 in addition to multiple additional small sub solid nodules. Lingular subsegmental atelectasis medially.  Adjoining Schmorl' s nodes observed in the thoracic spine at the T7-8 level.  CT ABDOMEN AND PELVIS FINDINGS  Presumably due to reduced cardiac output, normal timing of the scan reduced an resulted an early arterial phase imaging of the abdomen and pelvis. We also  obtained delayed phase images through the kidneys. The early arterial phase of contrast administration, prior to portal venous or delayed phase images of the liver, unfortunately result and reduced sensitivity for most liver lesion types.  Hepatobiliary: Several punctate calcifications favor remote granulomatous disease. No worrisome hepatic enhancement, with the caveats that sensitivity is reduced due to the early phase of contrast timing. Gallbladder mildly contracted.  Pancreas: Unremarkable  Spleen: Unremarkable  Adrenals/Urinary Tract: Unremarkable  Stomach/Bowel: Appendix and bowel appear normal.  Vascular/Lymphatic: Aortoiliac atherosclerotic vascular disease. No discrete adenopathy identified.  Reproductive: Punctate calcification in the slightly enlarged prostate gland, otherwise negative.  Other: No supplemental non-categorized findings.  Musculoskeletal: Moderate osteoarthritis of both hips. Lumbar spondylosis and degenerative disc disease causing suspected impingement at all levels between L2 and S1.  IMPRESSION: 1. Scattered nodular densities in the upper lobes, primarily sub solid. Given the patient's recent congestive heart failure and edema, some of the sub solid densities could be due to residual edema, in the sub solid appearance would be a highly atypical for colon cancer metastatic disease. However, the possibility of lung adenocarcinoma  cannot be dismissed based on today's scan. One of the right upper lobe 1 cm nodules is solid and could be biopsied or PET-CT could be utilized to evaluate this lesion. PET-CT probably wall be helpful for the sub solid lesions, but chest CT in 3 months time will be helpful in assessing the sub solid lesions for stability. 2. No findings of metastatic disease in the liver or abdomen; please note that we do have reduced sensitivity due to the early arterial phase achieved with standard contrast timing. Standard timing was utilized, but I suspect the patient has  dramatically reduced cardiac output, leading to delayed contrast transit. The considerable left heart enlargement would favor reduced cardiac output, correlate with ECG results. 3. Coronary atherosclerosis.   Electronically Signed   By: Sherryl Barters M.D.   On: 10/10/2014 13:47   Ct Abdomen Pelvis W Contrast  10/10/2014   CLINICAL DATA:  Moderately differentiated 5 cm colonic adenocarcinoma found on colonoscopy at [facility] in [place], [place] in October 2015, nonobstructing mass 30 cm from the anal verge. The mass was resected along with other colonic polyps. This CT is for the patient's initial staging workup. Dyspnea with exertion.  EXAM: CT CHEST, ABDOMEN, AND PELVIS WITH CONTRAST  TECHNIQUE: Multidetector CT imaging of the chest, abdomen and pelvis was performed following the standard protocol during bolus administration of intravenous contrast.  CONTRAST:  166mL OMNIPAQUE IOHEXOL 300 MG/ML  SOLN  COMPARISON:  Chest radiograph of 10/05/2014  FINDINGS: CT CHEST FINDINGS  Indistinct right upper paratracheal lymph node 0.7 cm in short axis, image 10 series 2. AP window lymph node 0.8 cm in short axis, image 16 series 2. Calcified right hilar and left lower paratracheal lymph nodes compatible with remote granulomatous disease. Diffuse mild wall thickening in the esophagus.  Considerable cardiomegaly, particularly involving the left heart. Coronary atherosclerosis involving the distal left main, left anterior descending, circumflex, and right coronary arteries.  There are patchy subsolid nodules and regions in the upper lobes, with a 3.2 by 2.2 cm ground-glass opacity in the right upper lobe on image 13 of series 4, a spiculated appearing 1.0 by 1.0 cm solid nodule in the right upper lobe on image 17 of series 4, and confluent indistinct airspace opacity medially in the left upper lobe on image 13 of series 4 in addition to multiple additional small sub solid nodules. Lingular  subsegmental atelectasis medially.  Adjoining Schmorl' s nodes observed in the thoracic spine at the T7-8 level.  CT ABDOMEN AND PELVIS FINDINGS  Presumably due to reduced cardiac output, normal timing of the scan reduced an resulted an early arterial phase imaging of the abdomen and pelvis. We also obtained delayed phase images through the kidneys. The early arterial phase of contrast administration, prior to portal venous or delayed phase images of the liver, unfortunately result and reduced sensitivity for most liver lesion types.  Hepatobiliary: Several punctate calcifications favor remote granulomatous disease. No worrisome hepatic enhancement, with the caveats that sensitivity is reduced due to the early phase of contrast timing. Gallbladder mildly contracted.  Pancreas: Unremarkable  Spleen: Unremarkable  Adrenals/Urinary Tract: Unremarkable  Stomach/Bowel: Appendix and bowel appear normal.  Vascular/Lymphatic: Aortoiliac atherosclerotic vascular disease. No discrete adenopathy identified.  Reproductive: Punctate calcification in the slightly enlarged prostate gland, otherwise negative.  Other: No supplemental non-categorized findings.  Musculoskeletal: Moderate osteoarthritis of both hips. Lumbar spondylosis and degenerative disc disease causing suspected impingement at all levels between L2 and S1.  IMPRESSION: 1. Scattered nodular densities  in the upper lobes, primarily sub solid. Given the patient's recent congestive heart failure and edema, some of the sub solid densities could be due to residual edema, in the sub solid appearance would be a highly atypical for colon cancer metastatic disease. However, the possibility of lung adenocarcinoma cannot be dismissed based on today's scan. One of the right upper lobe 1 cm nodules is solid and could be biopsied or PET-CT could be utilized to evaluate this lesion. PET-CT probably wall be helpful for the sub solid lesions, but chest CT in 3 months time will be  helpful in assessing the sub solid lesions for stability. 2. No findings of metastatic disease in the liver or abdomen; please note that we do have reduced sensitivity due to the early arterial phase achieved with standard contrast timing. Standard timing was utilized, but I suspect the patient has dramatically reduced cardiac output, leading to delayed contrast transit. The considerable left heart enlargement would favor reduced cardiac output, correlate with ECG results. 3. Coronary atherosclerosis.   Electronically Signed   By: Sherryl Barters M.D.   On: 10/10/2014 13:47   Dg Chest Port 1 View  09/23/2014   CLINICAL DATA:  Acute onset of shortness of breath, productive cough and congestion. Personal history of smoking. Initial encounter.  EXAM: PORTABLE CHEST - 1 VIEW  COMPARISON:  Chest radiograph performed 09/16/2014  FINDINGS: The lungs are well-aerated. Minimal bibasilar atelectasis is noted. There is no evidence of pleural effusion or pneumothorax.  The cardiomediastinal silhouette is mildly enlarged. No acute osseous abnormalities are seen.  IMPRESSION: Minimal bibasilar atelectasis noted; mild cardiomegaly seen.   Electronically Signed   By: Garald Balding M.D.   On: 09/23/2014 22:49   Dg Abd 2 Views  09/13/2014   CLINICAL DATA:  Abdominal soreness for 2 days followup, recent colonoscopy  EXAM: ABDOMEN - 2 VIEW  COMPARISON:  None.  FINDINGS: Scattered large and small bowel gas is noted. No abnormal mass or abnormal calcifications are seen. A small amount of retained fecal material is seen. No free air is noted. No acute bony abnormality is seen.  IMPRESSION: Nonspecific abdomen.   Electronically Signed   By: Inez Catalina M.D.   On: 09/13/2014 08:50    ASSESSMENT AND PLAN: This is a very pleasant 68 years old African-American male with questionable early stage colon adenocarcinoma status post colon polyp resection in [facility]. The patient was seen recently by gastroenterology and  they aren't requesting records from [facility] before proceeding with the next step in his management. He also missed his appointment with the surgeon and this is rescheduled for 10/23/2014. His recent CT scan of the chest, abdomen and pelvis showed scattered nodular densities in the upper lobes primarily sub-solid concerning for residual edema but highly atypical for colon cancer metastasis. Primary lung cancer cannot be excluded. I discussed the scan results with the patient today and recommended for him to proceed with a PET scan for further evaluation of these lesions. I strongly encouraged him to keep his appointment with general surgery and gastroenterology for evaluation of his condition. I will see him back for follow-up visit in 2 weeks for reevaluation and discussion of his PET scan results and recommendation regarding his condition. He was advised to call immediately if he has any concerning symptoms in the interval.  The patient voices understanding of current disease status and treatment options and is in agreement with the current care plan.  All questions were answered. The patient knows  to call the clinic with any problems, questions or concerns. We can certainly see the patient much sooner if necessary.  I spent 15 minutes counseling the patient face to face. The total time spent in the appointment was 25 minutes.  Disclaimer: This note was dictated with voice recognition software. Similar sounding words can inadvertently be transcribed and may not be corrected upon review.

## 2014-10-11 NOTE — Telephone Encounter (Signed)
LM to confirm appt for 10/25/13. Also gave info for referrals.

## 2014-10-13 ENCOUNTER — Emergency Department (HOSPITAL_COMMUNITY): Payer: Medicare Other

## 2014-10-13 ENCOUNTER — Other Ambulatory Visit: Payer: Self-pay

## 2014-10-13 ENCOUNTER — Inpatient Hospital Stay (HOSPITAL_COMMUNITY)
Admission: EM | Admit: 2014-10-13 | Discharge: 2014-10-16 | DRG: 308 | Disposition: A | Discharge status: Home / Self Care | Payer: Medicare Other | Attending: Family Medicine | Admitting: Family Medicine

## 2014-10-13 ENCOUNTER — Inpatient Hospital Stay (HOSPITAL_COMMUNITY): Payer: Medicare Other

## 2014-10-13 ENCOUNTER — Encounter (HOSPITAL_COMMUNITY): Payer: Self-pay | Admitting: *Deleted

## 2014-10-13 DIAGNOSIS — R55 Syncope and collapse: Secondary | ICD-10-CM

## 2014-10-13 DIAGNOSIS — B1920 Unspecified viral hepatitis C without hepatic coma: Secondary | ICD-10-CM | POA: Diagnosis present

## 2014-10-13 DIAGNOSIS — C189 Malignant neoplasm of colon, unspecified: Secondary | ICD-10-CM | POA: Diagnosis not present

## 2014-10-13 DIAGNOSIS — I951 Orthostatic hypotension: Secondary | ICD-10-CM | POA: Diagnosis present

## 2014-10-13 DIAGNOSIS — R918 Other nonspecific abnormal finding of lung field: Secondary | ICD-10-CM | POA: Diagnosis not present

## 2014-10-13 DIAGNOSIS — I509 Heart failure, unspecified: Secondary | ICD-10-CM

## 2014-10-13 DIAGNOSIS — I4891 Unspecified atrial fibrillation: Secondary | ICD-10-CM | POA: Diagnosis not present

## 2014-10-13 DIAGNOSIS — I5020 Unspecified systolic (congestive) heart failure: Secondary | ICD-10-CM

## 2014-10-13 DIAGNOSIS — I5023 Acute on chronic systolic (congestive) heart failure: Secondary | ICD-10-CM | POA: Diagnosis present

## 2014-10-13 DIAGNOSIS — R0602 Shortness of breath: Secondary | ICD-10-CM | POA: Diagnosis not present

## 2014-10-13 DIAGNOSIS — Z5900 Homelessness unspecified: Secondary | ICD-10-CM | POA: Insufficient documentation

## 2014-10-13 DIAGNOSIS — I10 Essential (primary) hypertension: Secondary | ICD-10-CM | POA: Diagnosis present

## 2014-10-13 DIAGNOSIS — J449 Chronic obstructive pulmonary disease, unspecified: Secondary | ICD-10-CM | POA: Diagnosis present

## 2014-10-13 DIAGNOSIS — R079 Chest pain, unspecified: Secondary | ICD-10-CM | POA: Diagnosis present

## 2014-10-13 DIAGNOSIS — Z87891 Personal history of nicotine dependence: Secondary | ICD-10-CM | POA: Diagnosis not present

## 2014-10-13 DIAGNOSIS — Z7982 Long term (current) use of aspirin: Secondary | ICD-10-CM | POA: Diagnosis not present

## 2014-10-13 DIAGNOSIS — I429 Cardiomyopathy, unspecified: Secondary | ICD-10-CM | POA: Diagnosis present

## 2014-10-13 DIAGNOSIS — Z7901 Long term (current) use of anticoagulants: Secondary | ICD-10-CM | POA: Diagnosis not present

## 2014-10-13 DIAGNOSIS — R069 Unspecified abnormalities of breathing: Secondary | ICD-10-CM | POA: Diagnosis not present

## 2014-10-13 DIAGNOSIS — J029 Acute pharyngitis, unspecified: Secondary | ICD-10-CM | POA: Diagnosis present

## 2014-10-13 LAB — CBC WITH DIFFERENTIAL/PLATELET
Basophils Absolute: 0 10*3/uL (ref 0.0–0.1)
Basophils Relative: 0 % (ref 0–1)
Eosinophils Absolute: 0.2 10*3/uL (ref 0.0–0.7)
Eosinophils Relative: 3 % (ref 0–5)
HCT: 38.9 % — ABNORMAL LOW (ref 39.0–52.0)
HEMOGLOBIN: 12.5 g/dL — AB (ref 13.0–17.0)
LYMPHS ABS: 2.3 10*3/uL (ref 0.7–4.0)
LYMPHS PCT: 24 % (ref 12–46)
MCH: 29.6 pg (ref 26.0–34.0)
MCHC: 32.1 g/dL (ref 30.0–36.0)
MCV: 92.2 fL (ref 78.0–100.0)
Monocytes Absolute: 1.1 10*3/uL — ABNORMAL HIGH (ref 0.1–1.0)
Monocytes Relative: 11 % (ref 3–12)
NEUTROS ABS: 5.9 10*3/uL (ref 1.7–7.7)
NEUTROS PCT: 62 % (ref 43–77)
Platelets: 231 10*3/uL (ref 150–400)
RBC: 4.22 MIL/uL (ref 4.22–5.81)
RDW: 13.7 % (ref 11.5–15.5)
WBC: 9.5 10*3/uL (ref 4.0–10.5)

## 2014-10-13 LAB — COMPREHENSIVE METABOLIC PANEL
ALT: 30 U/L (ref 0–53)
AST: 33 U/L (ref 0–37)
Albumin: 3.1 g/dL — ABNORMAL LOW (ref 3.5–5.2)
Alkaline Phosphatase: 62 U/L (ref 39–117)
Anion gap: 7 (ref 5–15)
BUN: 15 mg/dL (ref 6–23)
CHLORIDE: 108 meq/L (ref 96–112)
CO2: 25 mmol/L (ref 19–32)
Calcium: 8.8 mg/dL (ref 8.4–10.5)
Creatinine, Ser: 0.95 mg/dL (ref 0.50–1.35)
GFR calc Af Amer: >90 mL/min (ref >90)
GFR, EST NON AFRICAN AMERICAN: 84 mL/min — AB (ref >90)
GLUCOSE: 131 mg/dL — AB (ref 70–99)
Potassium: 4.7 mmol/L (ref 3.5–5.1)
SODIUM: 140 mmol/L (ref 135–145)
TOTAL PROTEIN: 6.6 g/dL (ref 6.0–8.3)
Total Bilirubin: 0.7 mg/dL (ref 0.3–1.2)

## 2014-10-13 LAB — GLUCOSE, CAPILLARY: Glucose-Capillary: 152 mg/dL — ABNORMAL HIGH (ref 70–99)

## 2014-10-13 LAB — LACTIC ACID, PLASMA: Lactic Acid, Venous: 1.3 mmol/L (ref 0.5–2.2)

## 2014-10-13 LAB — I-STAT CHEM 8, ED
BUN: 17 mg/dL (ref 6–23)
Calcium, Ion: 1.17 mmol/L (ref 1.13–1.30)
Chloride: 107 mEq/L (ref 96–112)
Creatinine, Ser: 0.9 mg/dL (ref 0.50–1.35)
Glucose, Bld: 104 mg/dL — ABNORMAL HIGH (ref 70–99)
HCT: 48 % (ref 39.0–52.0)
Hemoglobin: 16.3 g/dL (ref 13.0–17.0)
Potassium: 4.5 mmol/L (ref 3.5–5.1)
Sodium: 142 mmol/L (ref 135–145)
TCO2: 22 mmol/L (ref 0–100)

## 2014-10-13 LAB — MRSA PCR SCREENING: MRSA BY PCR: NEGATIVE

## 2014-10-13 LAB — TROPONIN I
TROPONIN I: 0.04 ng/mL — AB (ref <0.031)
Troponin I: 0.04 ng/mL — ABNORMAL HIGH (ref <0.031)
Troponin I: 0.04 ng/mL — ABNORMAL HIGH (ref <0.031)
Troponin I: 0.05 ng/mL — ABNORMAL HIGH (ref <0.031)

## 2014-10-13 LAB — PROTIME-INR
INR: 1.44 (ref 0.00–1.49)
Prothrombin Time: 17.6 seconds — ABNORMAL HIGH (ref 11.6–15.2)

## 2014-10-13 LAB — DIGOXIN LEVEL: Digoxin Level: 0.7 ng/mL — ABNORMAL LOW (ref 0.8–2.0)

## 2014-10-13 LAB — I-STAT CG4 LACTIC ACID, ED: LACTIC ACID, VENOUS: 3.43 mmol/L — AB (ref 0.5–2.2)

## 2014-10-13 MED ORDER — DIGOXIN 250 MCG PO TABS
0.2500 mg | ORAL_TABLET | Freq: Every day | ORAL | Status: DC
  Administered 2014-10-13 – 2014-10-16 (×4): 0.25 mg via ORAL
  Filled 2014-10-13 (×4): qty 1

## 2014-10-13 MED ORDER — DEXTROSE 5 % IV SOLN
5.0000 mg/h | Freq: Once | INTRAVENOUS | Status: AC
  Administered 2014-10-13: 5 mg/h via INTRAVENOUS

## 2014-10-13 MED ORDER — IPRATROPIUM-ALBUTEROL 18-103 MCG/ACT IN AERO: 1.0000 | INHALATION_SPRAY | RESPIRATORY_TRACT | Status: DC | PRN

## 2014-10-13 MED ORDER — ATORVASTATIN CALCIUM 20 MG PO TABS
20.0000 mg | ORAL_TABLET | Freq: Every day | ORAL | Status: DC
  Administered 2014-10-13 – 2014-10-16 (×4): 20 mg via ORAL
  Filled 2014-10-13 (×4): qty 1

## 2014-10-13 MED ORDER — CARVEDILOL 6.25 MG PO TABS
6.2500 mg | ORAL_TABLET | Freq: Two times a day (BID) | ORAL | Status: DC
  Administered 2014-10-13 – 2014-10-14 (×3): 6.25 mg via ORAL
  Filled 2014-10-13 (×5): qty 1

## 2014-10-13 MED ORDER — FUROSEMIDE 20 MG PO TABS
20.0000 mg | ORAL_TABLET | Freq: Every day | ORAL | Status: DC
  Administered 2014-10-13 – 2014-10-16 (×4): 20 mg via ORAL
  Filled 2014-10-13 (×4): qty 1

## 2014-10-13 MED ORDER — SODIUM CHLORIDE 0.9 % IJ SOLN: 3.0000 mL | INTRAMUSCULAR | Status: DC | PRN

## 2014-10-13 MED ORDER — PHENOL 1.4 % MT LIQD
2.0000 | OROMUCOSAL | Status: DC | PRN
  Administered 2014-10-13: 2 via OROMUCOSAL
  Filled 2014-10-13: qty 177

## 2014-10-13 MED ORDER — SODIUM CHLORIDE 0.9 % IV SOLN: 250.0000 mL | INTRAVENOUS | Status: DC | PRN

## 2014-10-13 MED ORDER — ASPIRIN 81 MG PO CHEW
324.0000 mg | CHEWABLE_TABLET | Freq: Every day | ORAL | Status: DC
  Administered 2014-10-14 – 2014-10-16 (×3): 324 mg via ORAL
  Filled 2014-10-13 (×3): qty 4

## 2014-10-13 MED ORDER — MOMETASONE FURO-FORMOTEROL FUM 200-5 MCG/ACT IN AERO
2.0000 | INHALATION_SPRAY | Freq: Two times a day (BID) | RESPIRATORY_TRACT | Status: DC
  Administered 2014-10-13 – 2014-10-16 (×6): 2 via RESPIRATORY_TRACT
  Filled 2014-10-13: qty 8.8

## 2014-10-13 MED ORDER — SODIUM CHLORIDE 0.9 % IJ SOLN
3.0000 mL | Freq: Two times a day (BID) | INTRAMUSCULAR | Status: DC
  Administered 2014-10-13 – 2014-10-14 (×3): 3 mL via INTRAVENOUS

## 2014-10-13 MED ORDER — SODIUM CHLORIDE 0.9 % IJ SOLN
3.0000 mL | Freq: Two times a day (BID) | INTRAMUSCULAR | Status: DC
  Administered 2014-10-15 – 2014-10-16 (×2): 3 mL via INTRAVENOUS

## 2014-10-13 MED ORDER — MOMETASONE FURO-FORMOTEROL FUM 200-5 MCG/ACT IN AERO: 2.0000 | INHALATION_SPRAY | Freq: Two times a day (BID) | RESPIRATORY_TRACT | Status: DC

## 2014-10-13 MED ORDER — RIVAROXABAN 20 MG PO TABS
20.0000 mg | ORAL_TABLET | Freq: Every day | ORAL | Status: DC
  Administered 2014-10-13 – 2014-10-16 (×4): 20 mg via ORAL
  Filled 2014-10-13 (×5): qty 1

## 2014-10-13 MED ORDER — CETYLPYRIDINIUM CHLORIDE 0.05 % MT LIQD
7.0000 mL | Freq: Two times a day (BID) | OROMUCOSAL | Status: DC
  Administered 2014-10-13 – 2014-10-15 (×5): 7 mL via OROMUCOSAL

## 2014-10-13 MED ORDER — TIOTROPIUM BROMIDE MONOHYDRATE 18 MCG IN CAPS
18.0000 ug | ORAL_CAPSULE | Freq: Every day | RESPIRATORY_TRACT | Status: DC
  Administered 2014-10-15 – 2014-10-16 (×2): 18 ug via RESPIRATORY_TRACT
  Filled 2014-10-13 (×2): qty 5

## 2014-10-13 MED ORDER — IPRATROPIUM-ALBUTEROL 0.5-2.5 (3) MG/3ML IN SOLN
3.0000 mL | RESPIRATORY_TRACT | Status: DC | PRN
Start: 2014-10-13 — End: 2014-10-16

## 2014-10-13 MED ORDER — LISINOPRIL 5 MG PO TABS
5.0000 mg | ORAL_TABLET | Freq: Every day | ORAL | Status: DC
  Administered 2014-10-13 – 2014-10-16 (×4): 5 mg via ORAL
  Filled 2014-10-13 (×4): qty 1

## 2014-10-13 NOTE — H&P (Signed)
[facility] Admission History and Physical Service Pager: [phone]  Patient name: ZAYED GRIFFIE Medical record number: [id number] Date of birth: [birth year] Age: 68 y.o. Gender: male  Primary Care Provider: [name], MD Consultants: cardiology Code Status: full  Chief Complaint: chest pain, palpitations, passing out  Assessment and Plan: JONANTHONY NAHAR is a 68 y.o. male with a history of HFrEF, afib, and COPD presenting with chest pain, palpitations, and possible presyncope vs syncope.  Chest pain/afib with RVR: patient found to be in afib with rvr. Initially responded to dilt drip, though intermittently elevated to the 140's on max dose dilt while I was in the room. Chest pain likely related to afib and strain, though it does seem atypical sounding in that it was sharp in nature. Troponin is mildly elevated and t wave inversions in V5-6 are more pronounced possibly both related to strain from afib with rvr in a patient with LVH. Must also consider ACS. Note recent echo with EF 15% with diffuse hypokinesis. CXR with no evidence of PNA or pneumothorax. -cycle troponins -cardiac monitoring -continue dilt drip at this time, suspect that he will need additional agent to help control HR, though will await cards recs -cards consulted - appreciate their recs -had lipid panel, A1c, and TSH 09/16/14 all wnl -hold xarelto until CT head resulted, will restart if no evidence of hemorrhage -continue home coreg and digoxin -dig level per cardiology  Syncope vs presyncope: possibly a presyncopal event vs syncopal event. Patient is not the best historian, so it is difficult to determine which of these this is. Either way it is likely related to the patient going in to afib with RVR resulting in his dizziness. Considered CNS issue (CVA, head trauma), though patient with no neurological deficits on exam.  -work-up for afib per above -will obtain CT head given possible head  trauma and patient on xarelto -monitor on tele  Sore throat: mild erythema noted in posterior OP. No exudates noted. No tonsillar swelling noted. No hemoptysis or hematemesis. Possibly irritation from the chitlins he ate.  -will continue to monitor -chlorseptic spray for discomfort  HFrEF: echo 09/16/14 with EF 15%. -continue home coreg, digoxin, lasix, and lisinopril  COPD: stable on RA at this time. -continue home dulera, spiriva, and combivent  FEN/GI: NPO, SLIV Prophylaxis: on xarelto  Disposition: admit to step down, attending Dr Mingo Amber, discharge pending above work-up  History of Present Illness: SHANNAN GARFINKEL is a 68 y.o. male with a history of HFrEF, afib, and COPD presenting with chest pain, palpitations, and possible presyncope vs syncope.  Patient notes that he got up to use the bathroom at 1:30 am and felt dizzy as he stood up. He states that he then "felt like I went out." He notes that he felt this way and went to lean against a partition in his room. He notes finding himself leaning against this partition after a period of time, though he is unsure how long. He notes as he leaned against the partition he hit the right side of his head, though he is unsure how hard the impact was. He is unsure if he had a true syncopal event or if he had a presyncopal event. He notes sharp left sided chest pain with this last for ~2 hours. He notes some dyspnea with this pain. He denies diaphoresis with this. States this is similar to the pain he had with his last admission. He denies other syncopal events. He does note  a wet his pants with this episode, though states this has happened previously if he does not get to the restroom quickly enough. He denies post-ictal phase. He has no history of seizures. He notes feeling weak in both legs with this episode. He states there have been changes to his medication regimen recently and since these were made he has felt dizzy more frequently. It appears  that he had not been taking his coreg for a 5 day period earlier this month and this was restarted at a lower dose of 12.5 mg BID. Additionally the patient states he believes this all stems from him eating chitlins last night. Since that time he has had a sore throat when he swallows, though he is still able to swallow without difficulty. He denies hemoptysis, hematemesis, vomiting, nausea, or abdominal pain.   In the ED he was placed on a diltiazem drip and initially had good control of his HR with rates in the 100's, though throughout my exam his rate was in the 130's-140's on the max dose of diltiazem.  Review Of Systems: Per HPI with the following additions: none Otherwise 12 point review of systems was performed and was unremarkable.  Patient Active Problem List   Diagnosis Date Noted  . High risk social situation 09/30/2014  . Colon cancer 09/26/2014  . Dyspnea 09/22/2014  . Chest pain 09/16/2014  . Syncope   . COPD exacerbation   . HFrEF (heart failure with reduced ejection fraction) 09/12/2014  . A-fib 09/12/2014  . COPD (chronic obstructive pulmonary disease) 09/12/2014   Past Medical History: Past Medical History  Diagnosis Date  . A-fib   . Hypertension   . CHF (congestive heart failure)   . COPD (chronic obstructive pulmonary disease)   . Hepatitis C   . Tobacco abuse    Past Surgical History: Past Surgical History  Procedure Laterality Date  . Cardioversion    . Colonoscopy     Social History: History  Substance Use Topics  . Smoking status: Former Smoker    Quit date: 07/02/2014  . Smokeless tobacco: Never Used  . Alcohol Use: 0.0 oz/week    0 Not specified per week   Additional social history: none  Please also refer to relevant sections of EMR.  Family History: No family history on file. Allergies and Medications: No Known Allergies No current facility-administered medications on file prior to encounter.   Current Outpatient Prescriptions on File  Prior to Encounter  Medication Sig Dispense Refill  . albuterol-ipratropium (COMBIVENT) 18-103 MCG/ACT inhaler Inhale 1 puff into the lungs every 4 (four) hours as needed for wheezing or shortness of breath. 1 Inhaler 1  . aspirin 81 MG chewable tablet Chew 4 tablets (324 mg total) by mouth daily. 300 tablet 1  . atorvastatin (LIPITOR) 20 MG tablet Take 1 tablet (20 mg total) by mouth daily. 30 tablet 0  . carvedilol (COREG) 6.25 MG tablet Take 2 tablets (12.5 mg total) by mouth 2 (two) times daily with a meal. 120 tablet 2  . digoxin (LANOXIN) 0.25 MG tablet Take 1 tablet (0.25 mg total) by mouth daily. 30 tablet 0  . furosemide (LASIX) 20 MG tablet Take 20 mg by mouth daily.    Marland Kitchen lisinopril (PRINIVIL,ZESTRIL) 5 MG tablet Take 1 tablet (5 mg total) by mouth daily. 30 tablet 0  . mometasone-formoterol (DULERA) 200-5 MCG/ACT AERO Inhale 2 puffs into the lungs 2 (two) times daily. (Patient taking differently: Inhale 2 puffs into the lungs daily. ) 1  Inhaler 0  . rivaroxaban (XARELTO) 20 MG TABS tablet Take 1 tablet (20 mg total) by mouth daily with supper. 30 tablet 0  . tiotropium (SPIRIVA) 18 MCG inhalation capsule Place 18 mcg into inhaler and inhale daily.      Objective: BP 135/97 mmHg  Pulse 110  Temp(Src) 99.1 F (37.3 C) (Oral)  Resp 19  SpO2 99% Exam: General: NAD, resting in bed HEENT: NCAT, MMM, PERRL, right ear with cerumen impaction, posterior OP with mild erythema Cardiovascular: irr irr, no murmur appreciated Respiratory: CTAB, no wheezes or crackles Abdomen: s, NT, ND Extremities: no edema, 2+ DP pulses Skin: no lesions noted Neuro: CN 2-12 intact, with exception of hearing in right ear which is diminished (found to have cerumen impaction on exam), 5/5 strength in bilateral biceps, triceps, grip, quads, hamstrings, plantar and dorsiflexion, sensation to light touch intact in bilateral UE and LE, normal gait, 2+ patellar reflexes   Labs and Imaging: CBC BMET   Recent  Labs Lab 10/13/14 0353  WBC 9.5  HGB 12.5*  HCT 38.9*  PLT 231    Recent Labs Lab 10/13/14 0353  NA 140  K 4.7  CL 108  CO2 25  BUN 15  CREATININE 0.95  GLUCOSE 131*  CALCIUM 8.8     Dg Chest Port 1 View  10/13/2014   CLINICAL DATA:  Acute onset of left-sided chest pain, shortness of breath and dizziness. Personal history of smoking. Initial encounter.  EXAM: PORTABLE CHEST - 1 VIEW  COMPARISON:  Chest radiograph performed 10/05/2014, and CT of the chest performed 10/10/2014  FINDINGS: The lungs are well-aerated. Recently noted subsolid nodular opacities on CT are not well characterized on radiograph. There is suggestion of mild interstitial prominence, which may reflect minimal residual interstitial edema. There is no evidence of pleural effusion or pneumothorax.  The cardiomediastinal silhouette is borderline enlarged. No acute osseous abnormalities are seen.  IMPRESSION: Recently noted subsolid nodular opacities on CT are not well characterized on radiograph. Suggestion of mild interstitial prominence, which may reflect minimal residual interstitial edema, improved from the prior chest radiograph. Borderline cardiomegaly.   Electronically Signed   By: Garald Balding M.D.   On: 10/13/2014 04:21   Trop 0.04 elevated by new range EKG afib with RVR, rate 125, leads V5-6 with T wave inversions possible related to LVH with strain pattern  Leone Haven, MD 10/13/2014, 6:07 AM PGY-3, [facility] pager: [phone], text pages welcome

## 2014-10-13 NOTE — ED Provider Notes (Signed)
CSN: [id number]     Arrival date & time 10/13/14  6759 History  This chart was scribe for Julianne Rice, MD by Judithann Sauger, ED Scribe. The patient was seen in room A13C/A13C and the patient's care was started at 3:42 AM.     Chief Complaint  Patient presents with  . Atrial Fibrillation  . Sore Throat   Patient is a 68 y.o. male presenting with atrial fibrillation and pharyngitis. The history is provided by the patient. No language interpreter was used.  Atrial Fibrillation This is a chronic problem. The current episode started 3 to 5 hours ago. Associated symptoms include chest pain and shortness of breath. Pertinent negatives include no abdominal pain and no headaches.  Sore Throat Associated symptoms include chest pain and shortness of breath. Pertinent negatives include no abdominal pain and no headaches.   HPI Comments: Paul Clayton is a 68 y.o. male with a hx of A-fib who presents to the Emergency Department complaining of a constant 3/10 stabbing pain in his left chest onset around 1:30 am today. He reports associated dizziness and shortness of breath. This is associated with palpitations that are irregular.Marland Kitchen He denies abdominal pain.  He also c/o pain with swallowing onset 3:30 pm yesterday after eating. He adds that he took some mouth wash which he suspects made it worse.  No vomiting or diarrhea. Patient denies any abdominal pain. Tolerating secretions.  PCP: Dr. Berkley Harvey   Past Medical History  Diagnosis Date  . A-fib   . Hypertension   . CHF (congestive heart failure)   . COPD (chronic obstructive pulmonary disease)   . Hepatitis C   . Tobacco abuse    Past Surgical History  Procedure Laterality Date  . Cardioversion    . Colonoscopy     No family history on file. History  Substance Use Topics  . Smoking status: Former Smoker    Quit date: 07/02/2014  . Smokeless tobacco: Never Used  . Alcohol Use: 0.0 oz/week    0 Not specified per week    Review of  Systems  Constitutional: Negative for fever and chills.  HENT: Positive for trouble swallowing.   Respiratory: Positive for shortness of breath.   Cardiovascular: Positive for chest pain and palpitations.  Gastrointestinal: Negative for nausea, vomiting, abdominal pain and constipation.  Musculoskeletal: Negative for back pain, neck pain and neck stiffness.  Skin: Negative for rash and wound.  Neurological: Positive for dizziness and light-headedness. Negative for weakness, numbness and headaches.  All other systems reviewed and are negative.     Allergies  Review of patient's allergies indicates no known allergies.  Home Medications   Prior to Admission medications   Medication Sig Start Date End Date Taking? Authorizing Provider  albuterol-ipratropium (COMBIVENT) 18-103 MCG/ACT inhaler Inhale 1 puff into the lungs every 4 (four) hours as needed for wheezing or shortness of breath. 09/12/14   Olam Idler, MD  aspirin 81 MG chewable tablet Chew 4 tablets (324 mg total) by mouth daily. 09/12/14   Olam Idler, MD  atorvastatin (LIPITOR) 20 MG tablet Take 1 tablet (20 mg total) by mouth daily. 09/18/14   Dimas Chyle, MD  carvedilol (COREG) 6.25 MG tablet Take 2 tablets (12.5 mg total) by mouth 2 (two) times daily with a meal. 10/06/14   Hilton Sinclair, MD  digoxin (LANOXIN) 0.25 MG tablet Take 1 tablet (0.25 mg total) by mouth daily. 09/18/14   Dimas Chyle, MD  furosemide (LASIX) 20 MG tablet Take  20 mg by mouth daily.    Historical Provider, MD  lisinopril (PRINIVIL,ZESTRIL) 5 MG tablet Take 1 tablet (5 mg total) by mouth daily. 09/19/14   Dimas Chyle, MD  mometasone-formoterol (DULERA) 200-5 MCG/ACT AERO Inhale 2 puffs into the lungs 2 (two) times daily. Patient taking differently: Inhale 2 puffs into the lungs daily.  09/12/14   Olam Idler, MD  rivaroxaban (XARELTO) 20 MG TABS tablet Take 1 tablet (20 mg total) by mouth daily with supper. 09/18/14   Dimas Chyle, MD   tiotropium (SPIRIVA) 18 MCG inhalation capsule Place 18 mcg into inhaler and inhale daily.    Historical Provider, MD   BP 145/104 mmHg  Pulse 103  Temp(Src) 99.1 F (37.3 C) (Oral)  Resp 27  SpO2 96% Physical Exam  Constitutional: He is oriented to person, place, and time. He appears well-developed and well-nourished. No distress.  HENT:  Head: Normocephalic and atraumatic.  Mouth/Throat: Oropharynx is clear and moist.  Eyes: EOM are normal. Pupils are equal, round, and reactive to light.  Neck: Normal range of motion. Neck supple.  Cardiovascular:  Tachycardia. Irregularly irregular.  Pulmonary/Chest: Effort normal and breath sounds normal. No respiratory distress. He has no wheezes. He has no rales. He exhibits no tenderness.  Abdominal: Soft. Bowel sounds are normal.  Musculoskeletal: Normal range of motion. He exhibits edema (2+ bilateral pitting edema.). He exhibits no tenderness.  Neurological: He is alert and oriented to person, place, and time.  Moves all extremities without deficit. Sensation is grossly intact.  Skin: Skin is warm and dry. No rash noted. No erythema.  Psychiatric: He has a normal mood and affect. His behavior is normal.  Nursing note and vitals reviewed.   ED Course  Procedures (including critical care time) DIAGNOSTIC STUDIES: Oxygen Saturation is 100% on RA, normal by my interpretation.    COORDINATION OF CARE: 3:46 AM- Pt advised of plan for treatment and pt agrees.    Labs Review Labs Reviewed  CBC WITH DIFFERENTIAL - Abnormal; Notable for the following:    Hemoglobin 12.5 (*)    HCT 38.9 (*)    Monocytes Absolute 1.1 (*)    All other components within normal limits  COMPREHENSIVE METABOLIC PANEL - Abnormal; Notable for the following:    Glucose, Bld 131 (*)    Albumin 3.1 (*)    GFR calc non Af Amer 84 (*)    All other components within normal limits  TROPONIN I - Abnormal; Notable for the following:    Troponin I 0.04 (*)    All  other components within normal limits  PROTIME-INR - Abnormal; Notable for the following:    Prothrombin Time 17.6 (*)    All other components within normal limits    Imaging Review Dg Chest Port 1 View  10/13/2014   CLINICAL DATA:  Acute onset of left-sided chest pain, shortness of breath and dizziness. Personal history of smoking. Initial encounter.  EXAM: PORTABLE CHEST - 1 VIEW  COMPARISON:  Chest radiograph performed 10/05/2014, and CT of the chest performed 10/10/2014  FINDINGS: The lungs are well-aerated. Recently noted subsolid nodular opacities on CT are not well characterized on radiograph. There is suggestion of mild interstitial prominence, which may reflect minimal residual interstitial edema. There is no evidence of pleural effusion or pneumothorax.  The cardiomediastinal silhouette is borderline enlarged. No acute osseous abnormalities are seen.  IMPRESSION: Recently noted subsolid nodular opacities on CT are not well characterized on radiograph. Suggestion of mild interstitial prominence, which  may reflect minimal residual interstitial edema, improved from the prior chest radiograph. Borderline cardiomegaly.   Electronically Signed   By: Garald Balding M.D.   On: 10/13/2014 04:21     EKG Interpretation None      Date: 10/13/2014  Rate: 125  Rhythm: atrial fibrillation  QRS Axis: normal  Intervals: normal  ST/T Wave abnormalities: nonspecific T wave changes  Conduction Disutrbances:none  Narrative Interpretation:   Old EKG Reviewed: unchanged   MDM   Final diagnoses:  Chest pain      I personally performed the services described in this documentation, which was scribed in my presence. The recorded information has been reviewed and is accurate.    Patient with atrial fibrillation and RVR. Started on Cardizem drip with improvement of heart rate. Patient also had a very atypical sounding chest pain. Discussed with family medicine resident. Will see in the emergency  department  Julianne Rice, MD 10/13/14 [phone]

## 2014-10-13 NOTE — Consult Note (Signed)
Reason for Consult: a fib with RVR   Referring Physician: Dr Caryl Bis, [place]   PCP:  [name], MD  Primary Cardiologist:Dr. Clinton Dragone is an 68 y.o. male.    Chief Complaint:  Palpitations and SOB with a hx of A fib- presented by EMS   HPI: 68 y.o.male. He has a history of cardiomyopathy EF 15%. The information comes through his outpatient evaluation done very recently here at [facility]. He has atrial fibrillation-permanent-CHADS VASC score of 3.  He also has some type of mass in his abdomen. He had some chest discomfort. There is question of a syncopal episode and he was brought to the hospital in Nov. 2015-etiology was unclear.   He is to see Dr. Sung Amabile on the 29th.      Echo in Nov. 2015 : LV EF: 15%  ------------------------------------------------------------------- Indications:   Chest pain (786.50).  ------------------------------------------------------------------- Study Conclusions  - Left ventricle: The cavity size was severely dilated. Wall thickness was normal. The estimated ejection fraction was 15%. Diffuse hypokinesis. - Aortic valve: Valve area (Vmax): 2.1 cm^2. - Mitral valve: Dilated annulus. Structurally normal valve. There was severe regurgitation. - Left atrium: The atrium was moderately to severely dilated. - Right ventricle: The cavity size was dilated. Systolic function was mildly reduced. - Right atrium: The atrium was mildly to moderately dilated. - Tricuspid valve: There was severe regurgitation. - Pericardium, extracardiac: A trivial pericardial effusion was identified.   Now seeing Dr. Julien Nordmann for prob. Stage 1 colon adenocarcinoma, is to see surgeon 10/23/14.    Presented today with rapid a fib,  He is admitted by [facility] and his troponin is elevated at 0.04 perhaps due to RVR. He stated he had chili last night then rapid HR, vomited X 1 and had some SOB and mild diarrhea.  With rapid HR he had chest pain.   Once his HR down from 140 to 100 his chest pain resolved.  Currently resting well.     EKG a fib with RVR with LVH, ST depression increased in lateral leads.  He has dilt drip at 15 mg/hr.  Pt has been off BB for 2 weeks, this may have precipitated rapid HR.  He stated MD had stopped, but he had also run out.  He had filled yesterday but has not yet taken.  He continues with his Xarelto and his lanoxin.  When he stood up today he passed out, and hit his head- to have CT of head.       Past Medical History  Diagnosis Date  . A-fib   . Hypertension   . CHF (congestive heart failure)   . COPD (chronic obstructive pulmonary disease)   . Hepatitis C   . Tobacco abuse     Past Surgical History  Procedure Laterality Date  . Cardioversion    . Colonoscopy      Family History  Problem Relation Age of Onset  . Hypertension Mother    Social History:  reports that he quit smoking about 3 months ago. He has never used smokeless tobacco. He reports that he drinks alcohol. He reports that he uses illicit drugs (Cocaine).  Allergies: No Known Allergies   (Not in a hospital admission)  Results for orders placed or performed during the hospital encounter of 10/13/14 (from the past 48 hour(s))  CBC with Differential     Status: Abnormal   Collection Time: 10/13/14  3:53 AM  Result Value  Ref Range   WBC 9.5 4.0 - 10.5 K/uL   RBC 4.22 4.22 - 5.81 MIL/uL   Hemoglobin 12.5 (L) 13.0 - 17.0 g/dL   HCT 38.9 (L) 39.0 - 52.0 %   MCV 92.2 78.0 - 100.0 fL   MCH 29.6 26.0 - 34.0 pg   MCHC 32.1 30.0 - 36.0 g/dL   RDW 13.7 11.5 - 15.5 %   Platelets 231 150 - 400 K/uL   Neutrophils Relative % 62 43 - 77 %   Neutro Abs 5.9 1.7 - 7.7 K/uL   Lymphocytes Relative 24 12 - 46 %   Lymphs Abs 2.3 0.7 - 4.0 K/uL   Monocytes Relative 11 3 - 12 %   Monocytes Absolute 1.1 (H) 0.1 - 1.0 K/uL   Eosinophils Relative 3 0 - 5 %   Eosinophils Absolute 0.2 0.0 - 0.7 K/uL   Basophils Relative 0 0 - 1 %   Basophils  Absolute 0.0 0.0 - 0.1 K/uL  Comprehensive metabolic panel     Status: Abnormal   Collection Time: 10/13/14  3:53 AM  Result Value Ref Range   Sodium 140 135 - 145 mmol/L    Comment: Please note change in reference range.   Potassium 4.7 3.5 - 5.1 mmol/L    Comment: Please note change in reference range.   Chloride 108 96 - 112 mEq/L   CO2 25 19 - 32 mmol/L   Glucose, Bld 131 (H) 70 - 99 mg/dL   BUN 15 6 - 23 mg/dL   Creatinine, Ser 0.95 0.50 - 1.35 mg/dL   Calcium 8.8 8.4 - 10.5 mg/dL   Total Protein 6.6 6.0 - 8.3 g/dL   Albumin 3.1 (L) 3.5 - 5.2 g/dL   AST 33 0 - 37 U/L   ALT 30 0 - 53 U/L   Alkaline Phosphatase 62 39 - 117 U/L   Total Bilirubin 0.7 0.3 - 1.2 mg/dL   GFR calc non Af Amer 84 (L) >90 mL/min   GFR calc Af Amer >90 >90 mL/min    Comment: (NOTE) The eGFR has been calculated using the CKD EPI equation. This calculation has not been validated in all clinical situations. eGFR's persistently <90 mL/min signify possible Chronic Kidney Disease.    Anion gap 7 5 - 15  Troponin I     Status: Abnormal   Collection Time: 10/13/14  3:53 AM  Result Value Ref Range   Troponin I 0.04 (H) <0.031 ng/mL    Comment:        PERSISTENTLY INCREASED TROPONIN VALUES IN THE RANGE OF 0.04-0.49 ng/mL CAN BE SEEN IN:       -UNSTABLE ANGINA       -CONGESTIVE HEART FAILURE       -MYOCARDITIS       -CHEST TRAUMA       -ARRYHTHMIAS       -LATE PRESENTING MYOCARDIAL INFARCTION       -COPD   CLINICAL FOLLOW-UP RECOMMENDED. Please note change in reference range.   Protime-INR     Status: Abnormal   Collection Time: 10/13/14  3:53 AM  Result Value Ref Range   Prothrombin Time 17.6 (H) 11.6 - 15.2 seconds   INR 1.44 0.00 - 1.49  I-stat chem 8, ed     Status: Abnormal   Collection Time: 10/13/14  6:53 AM  Result Value Ref Range   Sodium 142 135 - 145 mmol/L   Potassium 4.5 3.5 - 5.1 mmol/L   Chloride 107 96 -  112 mEq/L   BUN 17 6 - 23 mg/dL   Creatinine, Ser 0.90 0.50 - 1.35 mg/dL    Glucose, Bld 104 (H) 70 - 99 mg/dL   Calcium, Ion 1.17 1.13 - 1.30 mmol/L   TCO2 22 0 - 100 mmol/L   Hemoglobin 16.3 13.0 - 17.0 g/dL   HCT 48.0 39.0 - 52.0 %  I-Stat CG4 Lactic Acid, ED     Status: Abnormal   Collection Time: 10/13/14  6:53 AM  Result Value Ref Range   Lactic Acid, Venous 3.43 (H) 0.5 - 2.2 mmol/L   Ct Head Wo Contrast  10/13/2014   CLINICAL DATA:  Syncope. Fall hitting head. Patient reports anticoagulant use.  EXAM: CT HEAD WITHOUT CONTRAST  TECHNIQUE: Contiguous axial images were obtained from the base of the skull through the vertex without intravenous contrast.  COMPARISON:  None.  FINDINGS: No evidence for acute infarction, hemorrhage, mass lesion, hydrocephalus, or extra-axial fluid. Mild cerebral and cerebellar atrophy. Mild vascular calcification without CT signs of proximal thrombosis. Calvarium intact. Scalp soft tissues grossly unremarkable. No sinus or mastoid disease.  IMPRESSION: Chronic changes as described.  No acute intracranial abnormality.  No skull fracture or intracranial hemorrhage.   Electronically Signed   By: Rolla Flatten M.D.   On: 10/13/2014 07:59   Dg Chest Port 1 View  10/13/2014   CLINICAL DATA:  Acute onset of left-sided chest pain, shortness of breath and dizziness. Personal history of smoking. Initial encounter.  EXAM: PORTABLE CHEST - 1 VIEW  COMPARISON:  Chest radiograph performed 10/05/2014, and CT of the chest performed 10/10/2014  FINDINGS: The lungs are well-aerated. Recently noted subsolid nodular opacities on CT are not well characterized on radiograph. There is suggestion of mild interstitial prominence, which may reflect minimal residual interstitial edema. There is no evidence of pleural effusion or pneumothorax.  The cardiomediastinal silhouette is borderline enlarged. No acute osseous abnormalities are seen.  IMPRESSION: Recently noted subsolid nodular opacities on CT are not well characterized on radiograph. Suggestion of mild  interstitial prominence, which may reflect minimal residual interstitial edema, improved from the prior chest radiograph. Borderline cardiomegaly.   Electronically Signed   By: Garald Balding M.D.   On: 10/13/2014 04:21    ROS: General:no colds or fevers, no weight changes Skin:no rashes or ulcers HEENT:no blurred vision, no congestion CV:see HPI PUL:see HPI AT:FTDD diarrhea last pm, no constipation or melena, no indigestion GU:no hematuria, no dysuria MS:no joint pain, no claudication Neuro:no syncope, no lightheadedness Endo:no diabetes, no thyroid disease   Blood pressure 138/91, pulse 84, temperature 98.1 F (36.7 C), temperature source Oral, resp. rate 15, SpO2 98 %.  Wt Readings from Last 3 Encounters:  10/11/14 189 lb 6.4 oz (85.911 kg)  10/09/14 191 lb 12.8 oz (87 kg)  10/06/14 189 lb (85.73 kg)    PE: General:Pleasant affect, NAD Skin:Warm and dry, brisk capillary refill HEENT:normocephalic, sclera clear, mucus membranes moist Neck:supple, mild JVD, no bruits, no adenopathy  Heart:irreg irreg without murmur, gallup, rub or click Lungs:clear to diminished without rales, rhonchi, or wheezes UKG:URKY, non tender, + BS, do not palpate liver spleen or masses Ext:no lower ext edema, 2+ pedal pulses, 2+ radial pulses Neuro:alert and oriented X 3, MAE, follows commands, + facial symmetry    Assessment/Plan Principal Problem:   Atrial fibrillation with RVR- has pemanent atrial fib with previous cardioversions in [place].  On Xarelto and dig., but he had been off his coreg for 2 weeks.  Would resume at  lower dose of 6.25 BID for now.  Wean off dilt drip as tolerated, with EF of 15% would not use dilt long term.  Will also check dig level.  Active Problems:   HFrEF (heart failure with reduced ejection fraction)- CXR with mild edema but pt sounds clear   A-fib- Permanent, ChadsVasc of 3   Chest pain- with rapid HR, now resolved   Elevated troponin, could be related to RVR.   Monitor.    Syncope- going from sitting to standing, having CT of his head    Colon cancer- followed by Dr. Julien Nordmann.    [place]  Nurse Practitioner Certified [place] Pager [phone] or after 5pm or weekends call [phone] 10/13/2014, 8:05 AM Patient seen and examined. I agree with the assessment and plan as detailed above. See also my additional thoughts below.   Unfortunately the patient has been off carvedilol. Hopefully once he is back on carvedilol he will have better heart rate control. We can use very short-term IV diltiazem. We cannot use oral diltiazem on a long-term basis because of his significant left ventricular dysfunction.  Dola Argyle, MD, [facility] 10/13/2014 8:09 AM

## 2014-10-13 NOTE — ED Notes (Signed)
Pt arrives from [facility] via [place]. Pt c/o palpatations and SOB on exertion that began about 2 hours ago. Pt has a hx of A-Fib. Per EMS pt HR 80-120's. EMS gave 324 ASA. 20G Lhand. Pt also c/o sore throat that started with the palpitations. Denies chest pain. 98% RA. 130/100.

## 2014-10-14 LAB — CBC
HCT: 37.3 % — ABNORMAL LOW (ref 39.0–52.0)
Hemoglobin: 12.2 g/dL — ABNORMAL LOW (ref 13.0–17.0)
MCH: 29.8 pg (ref 26.0–34.0)
MCHC: 32.7 g/dL (ref 30.0–36.0)
MCV: 91.2 fL (ref 78.0–100.0)
Platelets: 219 10*3/uL (ref 150–400)
RBC: 4.09 MIL/uL — ABNORMAL LOW (ref 4.22–5.81)
RDW: 13.6 % (ref 11.5–15.5)
WBC: 9.3 10*3/uL (ref 4.0–10.5)

## 2014-10-14 LAB — BASIC METABOLIC PANEL
Anion gap: 8 (ref 5–15)
BUN: 8 mg/dL (ref 6–23)
CALCIUM: 8.5 mg/dL (ref 8.4–10.5)
CO2: 21 mmol/L (ref 19–32)
Chloride: 106 mEq/L (ref 96–112)
Creatinine, Ser: 0.84 mg/dL (ref 0.50–1.35)
GFR calc Af Amer: >90 mL/min (ref >90)
GFR calc non Af Amer: 89 mL/min — ABNORMAL LOW (ref >90)
GLUCOSE: 113 mg/dL — AB (ref 70–99)
Potassium: 4.3 mmol/L (ref 3.5–5.1)
Sodium: 135 mmol/L (ref 135–145)

## 2014-10-14 LAB — GLUCOSE, CAPILLARY
Glucose-Capillary: 122 mg/dL — ABNORMAL HIGH (ref 70–99)
Glucose-Capillary: 123 mg/dL — ABNORMAL HIGH (ref 70–99)

## 2014-10-14 MED ORDER — CARVEDILOL 12.5 MG PO TABS
12.5000 mg | ORAL_TABLET | Freq: Two times a day (BID) | ORAL | Status: DC
  Administered 2014-10-14 – 2014-10-15 (×2): 12.5 mg via ORAL
  Filled 2014-10-14 (×4): qty 1

## 2014-10-14 MED ORDER — MENTHOL 3 MG MT LOZG
1.0000 | LOZENGE | OROMUCOSAL | Status: DC | PRN
  Administered 2014-10-14: 3 mg via ORAL
  Filled 2014-10-14 (×2): qty 9

## 2014-10-14 NOTE — Progress Notes (Addendum)
Patient ID: Paul Clayton, male   DOB: Oct 09, 1946, 67 y.o.   MRN: [id number]    Patient Name: Paul Clayton Date of Encounter: 10/14/2014     Principal Problem:   Atrial fibrillation with RVR Active Problems:   HFrEF (heart failure with reduced ejection fraction)   A-fib   Chest pain   Syncope   Colon cancer    SUBJECTIVE  Dyspnea improved. No palpitations.   CURRENT MEDS . antiseptic oral rinse  7 mL Mouth Rinse BID  . aspirin  324 mg Oral Daily  . atorvastatin  20 mg Oral Daily  . carvedilol  6.25 mg Oral BID WC  . digoxin  0.25 mg Oral Daily  . furosemide  20 mg Oral Daily  . lisinopril  5 mg Oral Daily  . mometasone-formoterol  2 puff Inhalation BID  . rivaroxaban  20 mg Oral Q supper  . sodium chloride  3 mL Intravenous Q12H  . sodium chloride  3 mL Intravenous Q12H  . tiotropium  18 mcg Inhalation Daily    OBJECTIVE  Filed Vitals:   10/14/14 0300 10/14/14 0400 10/14/14 0600 10/14/14 0818  BP: 123/86 123/86 138/95 119/97  Pulse: 48 106 53 49  Temp: 97.8 F (36.6 C)   98.8 F (37.1 C)  TempSrc: Oral   Oral  Resp: 30 24 17 22   Height:      Weight:      SpO2: 100% 99% 97% 100%    Intake/Output Summary (Last 24 hours) at 10/14/14 0957 Last data filed at 10/14/14 0700  Gross per 24 hour  Intake   1137 ml  Output   1975 ml  Net   -838 ml   Filed Weights   10/13/14 0811  Weight: 189 lb 6.4 oz (85.911 kg)    PHYSICAL EXAM  General: Pleasant, NAD. Neuro: Alert and oriented X 3. Moves all extremities spontaneously. Psych: Normal affect. HEENT:  Normal  Neck: Supple without bruits or JVD. Lungs:  Resp regular and unlabored, CTA except for minimal basilar rales. Heart: IRRR no s3, s4, or murmurs. Abdomen: Soft, non-tender, non-distended, BS + x 4.  Extremities: No clubbing, cyanosis or edema. DP/PT/Radials 2+ and equal bilaterally.  Accessory Clinical Findings  CBC  Recent Labs  10/13/14 0353 10/13/14 0653 10/14/14 0246  WBC 9.5  --  9.3   NEUTROABS 5.9  --   --   HGB 12.5* 16.3 12.2*  HCT 38.9* 48.0 37.3*  MCV 92.2  --  91.2  PLT 231  --  983   Basic Metabolic Panel  Recent Labs  10/13/14 0353 10/13/14 0653 10/14/14 0246  NA 140 142 135  K 4.7 4.5 4.3  CL 108 107 106  CO2 25  --  21  GLUCOSE 131* 104* 113*  BUN 15 17 8   CREATININE 0.95 0.90 0.84  CALCIUM 8.8  --  8.5   Liver Function Tests  Recent Labs  10/13/14 0353  AST 33  ALT 30  ALKPHOS 62  BILITOT 0.7  PROT 6.6  ALBUMIN 3.1*   No results for input(s): LIPASE, AMYLASE in the last 72 hours. Cardiac Enzymes  Recent Labs  10/13/14 1000 10/13/14 1506 10/13/14 2145  TROPONINI 0.04* 0.04* 0.05*   BNP Invalid input(s): POCBNP D-Dimer No results for input(s): DDIMER in the last 72 hours. Hemoglobin A1C No results for input(s): HGBA1C in the last 72 hours. Fasting Lipid Panel No results for input(s): CHOL, HDL, LDLCALC, TRIG, CHOLHDL, LDLDIRECT in the last 72 hours.  Thyroid Function Tests No results for input(s): TSH, T4TOTAL, T3FREE, THYROIDAB in the last 72 hours.  Invalid input(s): FREET3  TELE  Atrial fib with a RVR  Radiology/Studies  Dg Chest 2 View  10/05/2014   CLINICAL DATA:  Short of breath  EXAM: CHEST  2 VIEW  COMPARISON:  09/27/2014  FINDINGS: COPD with hyperinflation.  Cardiac enlargement with vascular congestion and mild interstitial edema. Minimal pleural effusion. Minimal progression in edema since the prior study.  IMPRESSION: Congestive heart failure with slight progression of interstitial edema.   Electronically Signed   By: Franchot Gallo M.D.   On: 10/05/2014 10:03   Dg Chest 2 View  09/28/2014   CLINICAL DATA:  Dizziness short of breath.  EXAM: CHEST  2 VIEW  COMPARISON:  09/23/2014  FINDINGS: Cardiac enlargement with mild vascular congestion and developing interstitial changes consistent with early edema. No blunting of costophrenic angles. No pneumothorax. Emphysematous changes.  IMPRESSION: Cardiac  enlargement with mild pulmonary vascular congestion and developing interstitial changes suggesting early interstitial edema.   Electronically Signed   By: Lucienne Capers M.D.   On: 09/28/2014 00:25   Dg Chest 2 View  09/16/2014   CLINICAL DATA:  Syncope while using the bathroom. Heart palpitations. History of hypertension, CHF and COPD.  EXAM: CHEST  2 VIEW  COMPARISON:  Chest radiograph September 13, 2014  FINDINGS: The cardiac silhouette remains similarly enlarged. Mildly calcified aortic knob. Similar mild interstitial prominence with increased lung volumes can be seen with COPD. Increased lung volumes. No pleural effusions. No focal consolidation. No pneumothorax. Soft tissue planes and included osseous structures are nonsuspicious  IMPRESSION: Stable cardiomegaly, no acute pulmonary process.   Electronically Signed   By: Elon Alas   On: 09/16/2014 05:45   Ct Head Wo Contrast  10/13/2014   CLINICAL DATA:  Syncope. Fall hitting head. Patient reports anticoagulant use.  EXAM: CT HEAD WITHOUT CONTRAST  TECHNIQUE: Contiguous axial images were obtained from the base of the skull through the vertex without intravenous contrast.  COMPARISON:  None.  FINDINGS: No evidence for acute infarction, hemorrhage, mass lesion, hydrocephalus, or extra-axial fluid. Mild cerebral and cerebellar atrophy. Mild vascular calcification without CT signs of proximal thrombosis. Calvarium intact. Scalp soft tissues grossly unremarkable. No sinus or mastoid disease.  IMPRESSION: Chronic changes as described.  No acute intracranial abnormality.  No skull fracture or intracranial hemorrhage.   Electronically Signed   By: Rolla Flatten M.D.   On: 10/13/2014 07:59   Ct Chest W Contrast  10/10/2014   CLINICAL DATA:  Moderately differentiated 5 cm colonic adenocarcinoma found on colonoscopy at [facility] in [place], [place] in October 2015, nonobstructing mass 30 cm from the anal verge. The mass was  resected along with other colonic polyps. This CT is for the patient's initial staging workup. Dyspnea with exertion.  EXAM: CT CHEST, ABDOMEN, AND PELVIS WITH CONTRAST  TECHNIQUE: Multidetector CT imaging of the chest, abdomen and pelvis was performed following the standard protocol during bolus administration of intravenous contrast.  CONTRAST:  169mL OMNIPAQUE IOHEXOL 300 MG/ML  SOLN  COMPARISON:  Chest radiograph of 10/05/2014  FINDINGS: CT CHEST FINDINGS  Indistinct right upper paratracheal lymph node 0.7 cm in short axis, image 10 series 2. AP window lymph node 0.8 cm in short axis, image 16 series 2. Calcified right hilar and left lower paratracheal lymph nodes compatible with remote granulomatous disease. Diffuse mild wall thickening in the esophagus.  Considerable cardiomegaly, particularly involving the left heart.  Coronary atherosclerosis involving the distal left main, left anterior descending, circumflex, and right coronary arteries.  There are patchy subsolid nodules and regions in the upper lobes, with a 3.2 by 2.2 cm ground-glass opacity in the right upper lobe on image 13 of series 4, a spiculated appearing 1.0 by 1.0 cm solid nodule in the right upper lobe on image 17 of series 4, and confluent indistinct airspace opacity medially in the left upper lobe on image 13 of series 4 in addition to multiple additional small sub solid nodules. Lingular subsegmental atelectasis medially.  Adjoining Schmorl' s nodes observed in the thoracic spine at the T7-8 level.  CT ABDOMEN AND PELVIS FINDINGS  Presumably due to reduced cardiac output, normal timing of the scan reduced an resulted an early arterial phase imaging of the abdomen and pelvis. We also obtained delayed phase images through the kidneys. The early arterial phase of contrast administration, prior to portal venous or delayed phase images of the liver, unfortunately result and reduced sensitivity for most liver lesion types.  Hepatobiliary: Several  punctate calcifications favor remote granulomatous disease. No worrisome hepatic enhancement, with the caveats that sensitivity is reduced due to the early phase of contrast timing. Gallbladder mildly contracted.  Pancreas: Unremarkable  Spleen: Unremarkable  Adrenals/Urinary Tract: Unremarkable  Stomach/Bowel: Appendix and bowel appear normal.  Vascular/Lymphatic: Aortoiliac atherosclerotic vascular disease. No discrete adenopathy identified.  Reproductive: Punctate calcification in the slightly enlarged prostate gland, otherwise negative.  Other: No supplemental non-categorized findings.  Musculoskeletal: Moderate osteoarthritis of both hips. Lumbar spondylosis and degenerative disc disease causing suspected impingement at all levels between L2 and S1.  IMPRESSION: 1. Scattered nodular densities in the upper lobes, primarily sub solid. Given the patient's recent congestive heart failure and edema, some of the sub solid densities could be due to residual edema, in the sub solid appearance would be a highly atypical for colon cancer metastatic disease. However, the possibility of lung adenocarcinoma cannot be dismissed based on today's scan. One of the right upper lobe 1 cm nodules is solid and could be biopsied or PET-CT could be utilized to evaluate this lesion. PET-CT probably wall be helpful for the sub solid lesions, but chest CT in 3 months time will be helpful in assessing the sub solid lesions for stability. 2. No findings of metastatic disease in the liver or abdomen; please note that we do have reduced sensitivity due to the early arterial phase achieved with standard contrast timing. Standard timing was utilized, but I suspect the patient has dramatically reduced cardiac output, leading to delayed contrast transit. The considerable left heart enlargement would favor reduced cardiac output, correlate with ECG results. 3. Coronary atherosclerosis.   Electronically Signed   By: Sherryl Barters M.D.   On:  10/10/2014 13:47   Ct Abdomen Pelvis W Contrast  10/10/2014   CLINICAL DATA:  Moderately differentiated 5 cm colonic adenocarcinoma found on colonoscopy at [facility] in [place], [place] in October 2015, nonobstructing mass 30 cm from the anal verge. The mass was resected along with other colonic polyps. This CT is for the patient's initial staging workup. Dyspnea with exertion.  EXAM: CT CHEST, ABDOMEN, AND PELVIS WITH CONTRAST  TECHNIQUE: Multidetector CT imaging of the chest, abdomen and pelvis was performed following the standard protocol during bolus administration of intravenous contrast.  CONTRAST:  161mL OMNIPAQUE IOHEXOL 300 MG/ML  SOLN  COMPARISON:  Chest radiograph of 10/05/2014  FINDINGS: CT CHEST FINDINGS  Indistinct right upper paratracheal lymph node 0.7 cm  in short axis, image 10 series 2. AP window lymph node 0.8 cm in short axis, image 16 series 2. Calcified right hilar and left lower paratracheal lymph nodes compatible with remote granulomatous disease. Diffuse mild wall thickening in the esophagus.  Considerable cardiomegaly, particularly involving the left heart. Coronary atherosclerosis involving the distal left main, left anterior descending, circumflex, and right coronary arteries.  There are patchy subsolid nodules and regions in the upper lobes, with a 3.2 by 2.2 cm ground-glass opacity in the right upper lobe on image 13 of series 4, a spiculated appearing 1.0 by 1.0 cm solid nodule in the right upper lobe on image 17 of series 4, and confluent indistinct airspace opacity medially in the left upper lobe on image 13 of series 4 in addition to multiple additional small sub solid nodules. Lingular subsegmental atelectasis medially.  Adjoining Schmorl' s nodes observed in the thoracic spine at the T7-8 level.  CT ABDOMEN AND PELVIS FINDINGS  Presumably due to reduced cardiac output, normal timing of the scan reduced an resulted an early arterial phase imaging of the  abdomen and pelvis. We also obtained delayed phase images through the kidneys. The early arterial phase of contrast administration, prior to portal venous or delayed phase images of the liver, unfortunately result and reduced sensitivity for most liver lesion types.  Hepatobiliary: Several punctate calcifications favor remote granulomatous disease. No worrisome hepatic enhancement, with the caveats that sensitivity is reduced due to the early phase of contrast timing. Gallbladder mildly contracted.  Pancreas: Unremarkable  Spleen: Unremarkable  Adrenals/Urinary Tract: Unremarkable  Stomach/Bowel: Appendix and bowel appear normal.  Vascular/Lymphatic: Aortoiliac atherosclerotic vascular disease. No discrete adenopathy identified.  Reproductive: Punctate calcification in the slightly enlarged prostate gland, otherwise negative.  Other: No supplemental non-categorized findings.  Musculoskeletal: Moderate osteoarthritis of both hips. Lumbar spondylosis and degenerative disc disease causing suspected impingement at all levels between L2 and S1.  IMPRESSION: 1. Scattered nodular densities in the upper lobes, primarily sub solid. Given the patient's recent congestive heart failure and edema, some of the sub solid densities could be due to residual edema, in the sub solid appearance would be a highly atypical for colon cancer metastatic disease. However, the possibility of lung adenocarcinoma cannot be dismissed based on today's scan. One of the right upper lobe 1 cm nodules is solid and could be biopsied or PET-CT could be utilized to evaluate this lesion. PET-CT probably wall be helpful for the sub solid lesions, but chest CT in 3 months time will be helpful in assessing the sub solid lesions for stability. 2. No findings of metastatic disease in the liver or abdomen; please note that we do have reduced sensitivity due to the early arterial phase achieved with standard contrast timing. Standard timing was utilized, but I  suspect the patient has dramatically reduced cardiac output, leading to delayed contrast transit. The considerable left heart enlargement would favor reduced cardiac output, correlate with ECG results. 3. Coronary atherosclerosis.   Electronically Signed   By: Sherryl Barters M.D.   On: 10/10/2014 13:47   Dg Chest Port 1 View  10/13/2014   CLINICAL DATA:  Acute onset of left-sided chest pain, shortness of breath and dizziness. Personal history of smoking. Initial encounter.  EXAM: PORTABLE CHEST - 1 VIEW  COMPARISON:  Chest radiograph performed 10/05/2014, and CT of the chest performed 10/10/2014  FINDINGS: The lungs are well-aerated. Recently noted subsolid nodular opacities on CT are not well characterized on radiograph. There is suggestion of mild  interstitial prominence, which may reflect minimal residual interstitial edema. There is no evidence of pleural effusion or pneumothorax.  The cardiomediastinal silhouette is borderline enlarged. No acute osseous abnormalities are seen.  IMPRESSION: Recently noted subsolid nodular opacities on CT are not well characterized on radiograph. Suggestion of mild interstitial prominence, which may reflect minimal residual interstitial edema, improved from the prior chest radiograph. Borderline cardiomegaly.   Electronically Signed   By: Garald Balding M.D.   On: 10/13/2014 04:21   Dg Chest Port 1 View  09/23/2014   CLINICAL DATA:  Acute onset of shortness of breath, productive cough and congestion. Personal history of smoking. Initial encounter.  EXAM: PORTABLE CHEST - 1 VIEW  COMPARISON:  Chest radiograph performed 09/16/2014  FINDINGS: The lungs are well-aerated. Minimal bibasilar atelectasis is noted. There is no evidence of pleural effusion or pneumothorax.  The cardiomediastinal silhouette is mildly enlarged. No acute osseous abnormalities are seen.  IMPRESSION: Minimal bibasilar atelectasis noted; mild cardiomegaly seen.   Electronically Signed   By: Garald Balding M.D.   On: 09/23/2014 22:49    ASSESSMENT AND PLAN  1. Atrial fib with a RVR, initially off of his coreg 2. Acute on chronic systolic heart failure 3. H/o polysubstance abuse, "I been clean since October" Rec: will continue to uptitrate beta blocker as heart rate and blood pressure allow. Continue oral lasix.  Sherilynn Dieu,M.D.  10/14/2014 9:57 AM

## 2014-10-14 NOTE — Progress Notes (Signed)
Family Medicine Teaching Service Daily Progress Note Intern Pager: [phone]  Patient name: Paul Clayton Medical record number: [id number] Date of birth: 07-08-1946 Age: 68 y.o. Gender: male  Primary Care Provider: [name], MD Consultants: Cardiology Code Status: Full  Pt Overview and Major Events to Date:  12/25 - Admitted with chest pain/syncope in setting of afib with RVR  Assessment and Plan: [name] is a 68 y.o. male with a history of HFrEF, afib, and COPD presenting with chest pain, palpitations, and possible presyncope vs syncope.  Chest pain/afib with RVR: Chest pain likely related to afib. Troponins mildly elevated, and EKG shows afib with RVR, rate 125, with V5,V6 T wave inversions likely related to strain from afib with rvr in a patient with LVH. Must also consider ACS. Note recent echo with EF 15% with diffuse hypokinesis. CXR with no evidence of PNA or pneumothorax. -Cardiology consulted, appreciate assistance -Continue trending troponins -Restarted coreg 6.25 bid - can titrate as needed to control HR -Continue home xarelto  Syncope vs presyncope: Likely related to afib with RVR resulting in his dizziness. Considered CNS issue (CVA, head trauma), though patient with no neurological deficits on exam. CT head negative.  -work-up for afib per above -monitor on tele  Sore throat: mild erythema noted in posterior OP. No exudates noted. No tonsillar swelling noted. No hemoptysis or hematemesis.  -will continue to monitor -chlorseptic spray for discomfort  HFrEF: echo 09/16/14 with EF 15%. -continue home coreg, digoxin, lasix, and lisinopril  COPD: stable on RA at this time. -continue home dulera, spiriva, and combivent  FEN/GI: Heart Healthy, SLIV Prophylaxis: on xarelto  Disposition: Admitted pending HR control and ACS rule out. Care management involved to help with medications  Subjective:  Doing well this morning. Denies chest pain. Still feels heart  fluttering occasionally and mild shortness of breath occasionally.   Objective: Temp:  [97.8 F (36.6 C)-99.1 F (37.3 C)] 97.8 F (36.6 C) (12/26 0300) Pulse Rate:  [41-116] 53 (12/26 0600) Resp:  [15-30] 17 (12/26 0600) BP: (98-138)/(56-95) 138/95 mmHg (12/26 0600) SpO2:  [95 %-100 %] 97 % (12/26 0600) Weight:  [189 lb 6.4 oz (85.911 kg)] 189 lb 6.4 oz (85.911 kg) (12/25 2423) Physical Exam: General: NAD, lying in hospital bed Cardiovascular: irregularly irregular, tachycardic, no murmurs appreciated Respiratory: NWOB, CTAB, no wheezes Abdomen: Soft, NT, ND Extremities: No cyanosis   Laboratory:  Recent Labs Lab 10/11/14 0824 10/13/14 0353 10/13/14 0653 10/14/14 0246  WBC 8.3 9.5  --  9.3  HGB 12.8* 12.5* 16.3 12.2*  HCT 40.6 38.9* 48.0 37.3*  PLT 208 231  --  219    Recent Labs Lab 10/11/14 0825 10/13/14 0353 10/13/14 0653 10/14/14 0246  NA 139 140 142 135  K 3.9 4.7 4.5 4.3  CL  --  108 107 106  CO2 25 25  --  21  BUN 11.6 15 17 8   CREATININE 0.9 0.95 0.90 0.84  CALCIUM 8.8 8.8  --  8.5  PROT 6.8 6.6  --   --   BILITOT 0.84 0.7  --   --   ALKPHOS 67 62  --   --   ALT 37 30  --   --   AST 29 33  --   --   GLUCOSE 142* 131* 104* 113*     Recent Labs Lab 10/13/14 0353 10/13/14 1000 10/13/14 1506 10/13/14 2145  TROPONINI 0.04* 0.04* 0.04* 0.05*    Imaging/Diagnostic Tests: Ct Head Wo Contrast  10/13/2014   CLINICAL DATA:  Syncope. Fall hitting head. Patient reports anticoagulant use.  EXAM: CT HEAD WITHOUT CONTRAST  TECHNIQUE: Contiguous axial images were obtained from the base of the skull through the vertex without intravenous contrast.  COMPARISON:  None.  FINDINGS: No evidence for acute infarction, hemorrhage, mass lesion, hydrocephalus, or extra-axial fluid. Mild cerebral and cerebellar atrophy. Mild vascular calcification without CT signs of proximal thrombosis. Calvarium intact. Scalp soft tissues grossly unremarkable. No sinus or mastoid  disease.  IMPRESSION: Chronic changes as described.  No acute intracranial abnormality.  No skull fracture or intracranial hemorrhage.   Electronically Signed   By: Rolla Flatten M.D.   On: 10/13/2014 07:59   Dg Chest Port 1 View  10/13/2014   CLINICAL DATA:  Acute onset of left-sided chest pain, shortness of breath and dizziness. Personal history of smoking. Initial encounter.  EXAM: PORTABLE CHEST - 1 VIEW  COMPARISON:  Chest radiograph performed 10/05/2014, and CT of the chest performed 10/10/2014  FINDINGS: The lungs are well-aerated. Recently noted subsolid nodular opacities on CT are not well characterized on radiograph. There is suggestion of mild interstitial prominence, which may reflect minimal residual interstitial edema. There is no evidence of pleural effusion or pneumothorax.  The cardiomediastinal silhouette is borderline enlarged. No acute osseous abnormalities are seen.  IMPRESSION: Recently noted subsolid nodular opacities on CT are not well characterized on radiograph. Suggestion of mild interstitial prominence, which may reflect minimal residual interstitial edema, improved from the prior chest radiograph. Borderline cardiomegaly.   Electronically Signed   By: Garald Balding M.D.   On: 10/13/2014 04:21   Dimas Chyle, MD 10/14/2014, 7:54 AM PGY-1 [place] Intern pager: [phone], text pages welcome

## 2014-10-15 LAB — BASIC METABOLIC PANEL
Anion gap: 7 (ref 5–15)
BUN: 9 mg/dL (ref 6–23)
CHLORIDE: 106 meq/L (ref 96–112)
CO2: 25 mmol/L (ref 19–32)
Calcium: 8.9 mg/dL (ref 8.4–10.5)
Creatinine, Ser: 0.82 mg/dL (ref 0.50–1.35)
GFR calc non Af Amer: 89 mL/min — ABNORMAL LOW (ref >90)
GLUCOSE: 103 mg/dL — AB (ref 70–99)
POTASSIUM: 4.3 mmol/L (ref 3.5–5.1)
Sodium: 138 mmol/L (ref 135–145)

## 2014-10-15 LAB — CBC
HCT: 40.9 % (ref 39.0–52.0)
HEMOGLOBIN: 13.3 g/dL (ref 13.0–17.0)
MCH: 29.6 pg (ref 26.0–34.0)
MCHC: 32.5 g/dL (ref 30.0–36.0)
MCV: 91.1 fL (ref 78.0–100.0)
Platelets: 273 10*3/uL (ref 150–400)
RBC: 4.49 MIL/uL (ref 4.22–5.81)
RDW: 13.4 % (ref 11.5–15.5)
WBC: 9.1 10*3/uL (ref 4.0–10.5)

## 2014-10-15 LAB — TROPONIN I
TROPONIN I: 0.07 ng/mL — AB (ref <0.031)
TROPONIN I: 0.03 ng/mL (ref <0.031)

## 2014-10-15 MED ORDER — CARVEDILOL 12.5 MG PO TABS
18.7500 mg | ORAL_TABLET | Freq: Two times a day (BID) | ORAL | Status: DC
  Administered 2014-10-15 – 2014-10-16 (×3): 18.75 mg via ORAL
  Filled 2014-10-15 (×4): qty 1

## 2014-10-15 NOTE — Discharge Instructions (Addendum)
You were admitted with a rapid heart rate due your atrial fibrillation. While here, you were restarted on your coreg and your heart rate returned to normal levels. You also met with our case work and Education officer, museum to discuss medication assistance and housing issues. It is important that you take your medications exactly as prescribed and that you follow up with your primary doctor.   Atrial Fibrillation Atrial fibrillation is a condition that causes your heart to beat irregularly. It may also cause your heart to beat faster than normal. Atrial fibrillation can prevent your heart from pumping blood normally. It increases your risk of stroke and heart problems. HOME CARE  Take medications as told by your doctor.  Only take medications that your doctor says are safe. Some medications can make the condition worse or happen again.  If blood thinners were prescribed by your doctor, take them exactly as told. Too much can cause bleeding. Too little and you will not have the needed protection against stroke and other problems.  Perform blood tests at home if told by your doctor.  Perform blood tests exactly as told by your doctor.  Do not drink alcohol.  Do not drink beverages with caffeine such as coffee, soda, and some teas.  Maintain a healthy weight.  Do not use diet pills unless your doctor says they are safe. They may make heart problems worse.  Follow diet instructions as told by your doctor.  Exercise regularly as told by your doctor.  Keep all follow-up appointments. GET HELP IF:  You notice a change in the speed, rhythm, or strength of your heartbeat.  You suddenly begin peeing (urinating) more often.  You get tired more easily when moving or exercising. GET HELP RIGHT AWAY IF:   You have chest or belly (abdominal) pain.  You feel sick to your stomach (nauseous).  You are short of breath.  You suddenly have swollen feet and ankles.  You feel dizzy.  You face, arms,  or legs feel numb or weak.  There is a change in your vision or speech. MAKE SURE YOU:   Understand these instructions.  Will watch your condition.  Will get help right away if you are not doing well or get worse. Document Released: 07/15/2008 Document Revised: 02/20/2014 Document Reviewed: 11/16/2012 [facility] Patient Information 2015 [place], [place]. This information is not intended to replace advice given to you by your health care provider. Make sure you discuss any questions you have with your health care provider.    Information on my medicine - XARELTO (Rivaroxaban)  This medication education was reviewed with me or my healthcare representative as part of my discharge preparation.  The pharmacist that spoke with me during my hospital stay was:  Adora Fridge, [facility]  Why was Xarelto prescribed for you? Xarelto was prescribed for you to reduce the risk of a blood clot forming that can cause a stroke if you have a medical condition called atrial fibrillation (a type of irregular heartbeat).  What do you need to know about xarelto ? Take your Xarelto ONCE DAILY at the same time every day with your evening meal. If you have difficulty swallowing the tablet whole, you may crush it and mix in applesauce just prior to taking your dose.  Take Xarelto exactly as prescribed by your doctor and DO NOT stop taking Xarelto without talking to the doctor who prescribed the medication.  Stopping without other stroke prevention medication to take the place of Xarelto may increase  your risk of developing a clot that causes a stroke.  Refill your prescription before you run out.  After discharge, you should have regular check-up appointments with your healthcare provider that is prescribing your Xarelto.  In the future your dose may need to be changed if your kidney function or weight changes by a significant amount.  What do you do if you miss a dose? If you are taking Xarelto ONCE DAILY  and you miss a dose, take it as soon as you remember on the same day then continue your regularly scheduled once daily regimen the next day. Do not take two doses of Xarelto at the same time or on the same day.   Important Safety Information A possible side effect of Xarelto is bleeding. You should call your healthcare provider right away if you experience any of the following: ? Bleeding from an injury or your nose that does not stop. ? Unusual colored urine (red or dark brown) or unusual colored stools (red or black). ? Unusual bruising for unknown reasons. ? A serious fall or if you hit your head (even if there is no bleeding).  Some medicines may interact with Xarelto and might increase your risk of bleeding while on Xarelto. To help avoid this, consult your healthcare provider or pharmacist prior to using any new prescription or non-prescription medications, including herbals, vitamins, non-steroidal anti-inflammatory drugs (NSAIDs) and supplements.  This website has more information on Xarelto: [email or web address]

## 2014-10-15 NOTE — Progress Notes (Signed)
Family Medicine Teaching Service Daily Progress Note Intern Pager: [phone]  Patient name: Paul Clayton Medical record number: [id number] Date of birth: 1945-11-19 Age: 68 y.o. Gender: male  Primary Care Provider: [name], MD Consultants: Cardiology Code Status: Full  Pt Overview and Major Events to Date:  12/25 - Admitted with chest pain/syncope in setting of afib with RVR  Assessment and Plan: Paul Clayton is a 68 y.o. male with a history of HFrEF, afib, and COPD presenting with chest pain, palpitations, and possible presyncope vs syncope.  Chest pain/afib with RVR: Chest pain likely related to afib. Troponins mildly elevated, and EKG shows afib with RVR, rate 125, with V5,V6 T wave inversions likely related to strain from afib with rvr in a patient with LVH. Must also consider ACS. Note recent echo with EF 15% with diffuse hypokinesis. CXR with no evidence of PNA or pneumothorax. Troponin 0.04>0.04>0.05 (likely related to heart strain from a-fib) HR 80-90's overnight. -Cardiology consulted, appreciate assistance -Continue trending troponins -Coreg 12.5 BID -Continue home xarelto -Consider transfer to telemetry today, pending cards evaluation.  Syncope vs presyncope: Likely related to afib with RVR resulting in his dizziness. Considered CNS issue (CVA, head trauma), though patient with no neurological deficits on exam. CT head negative.  No recurrent episodes. -work-up for afib per above -monitor on tele -OOB with assistance -c/s PT  Social: ?reliable housing, medication assistance -c/s SW -c/s CM   Sore throat: mild erythema noted in posterior OP. No exudates noted. No tonsillar swelling noted. No hemoptysis or hematemesis.  -will continue to monitor -chlorseptic spray for discomfort  HFrEF: echo 09/16/14 with EF 15%. -continue home digoxin, lasix, and lisinopril -Coreg increased to 12.5 BID  COPD: stable on RA at this time. -continue home dulera, spiriva, and  combivent  FEN/GI: Heart Healthy, SLIV Prophylaxis: on xarelto  Disposition: Discharge pending HR control and ACS rule out. Care management involved to help with medications  Subjective:  Patient reports that he is feeling great this morning.  He denies any recurrent episodes of syncope/pre-syncope.  He does continue to endorse dizziness with ambulation in the middle of the night.  He states that he most often notices symptoms when he is ambulating to the restroom in the middle of the night.  He states "I think I get up too quickly".  He reports being able to ambulate without dizziness during the day yesterday.  In addition, he states that he has SOB with walking.  Objective: Temp:  [98 F (36.7 C)-98.8 F (37.1 C)] 98.8 F (37.1 C) (12/27 0755) Pulse Rate:  [81-91] 91 (12/26 2319) Resp:  [18] 18 (12/27 0400) BP: (94-141)/(69-96) 130/96 mmHg (12/27 0755) SpO2:  [95 %-100 %] 100 % (12/27 0755) Physical Exam: General: well appearing male, NAD, sitting up in hospital bed Cardiovascular: irregularly irregular, regular rate, no murmurs appreciated Respiratory: NWOB, CTAB, no wheezes Abdomen: Soft, NT, ND, +BS Extremities: WWP, no edema, No cyanosis , 5/5 LE strength Neuro: no focal deficits, normal speech, follows commands  Laboratory:  Recent Labs Lab 10/13/14 0353 10/13/14 0653 10/14/14 0246 10/15/14 0235  WBC 9.5  --  9.3 9.1  HGB 12.5* 16.3 12.2* 13.3  HCT 38.9* 48.0 37.3* 40.9  PLT 231  --  219 273    Recent Labs Lab 10/11/14 0825  10/13/14 0353 10/13/14 0653 10/14/14 0246 10/15/14 0235  NA 139  < > 140 142 135 138  K 3.9  < > 4.7 4.5 4.3 4.3  CL  --   < >  108 107 106 106  CO2 25  --  25  --  21 25  BUN 11.6  < > 15 17 8 9   CREATININE 0.9  < > 0.95 0.90 0.84 0.82  CALCIUM 8.8  --  8.8  --  8.5 8.9  PROT 6.8  --  6.6  --   --   --   BILITOT 0.84  --  0.7  --   --   --   ALKPHOS 67  --  62  --   --   --   ALT 37  --  30  --   --   --   AST 29  --  33  --   --    --   GLUCOSE 142*  < > 131* 104* 113* 103*  < > = values in this interval not displayed.    Recent Labs Lab 10/13/14 0353 10/13/14 1000 10/13/14 1506 10/13/14 2145  TROPONINI 0.04* 0.04* 0.04* 0.05*    Imaging/Diagnostic Tests: Ct Head Wo Contrast  10/13/2014   CLINICAL DATA:  Syncope. Fall hitting head. Patient reports anticoagulant use.  EXAM: CT HEAD WITHOUT CONTRAST  TECHNIQUE: Contiguous axial images were obtained from the base of the skull through the vertex without intravenous contrast.  COMPARISON:  None.  FINDINGS: No evidence for acute infarction, hemorrhage, mass lesion, hydrocephalus, or extra-axial fluid. Mild cerebral and cerebellar atrophy. Mild vascular calcification without CT signs of proximal thrombosis. Calvarium intact. Scalp soft tissues grossly unremarkable. No sinus or mastoid disease.  IMPRESSION: Chronic changes as described.  No acute intracranial abnormality.  No skull fracture or intracranial hemorrhage.   Electronically Signed   By: Rolla Flatten M.D.   On: 10/13/2014 07:59   Dg Chest Port 1 View  10/13/2014   CLINICAL DATA:  Acute onset of left-sided chest pain, shortness of breath and dizziness. Personal history of smoking. Initial encounter.  EXAM: PORTABLE CHEST - 1 VIEW  COMPARISON:  Chest radiograph performed 10/05/2014, and CT of the chest performed 10/10/2014  FINDINGS: The lungs are well-aerated. Recently noted subsolid nodular opacities on CT are not well characterized on radiograph. There is suggestion of mild interstitial prominence, which may reflect minimal residual interstitial edema. There is no evidence of pleural effusion or pneumothorax.  The cardiomediastinal silhouette is borderline enlarged. No acute osseous abnormalities are seen.  IMPRESSION: Recently noted subsolid nodular opacities on CT are not well characterized on radiograph. Suggestion of mild interstitial prominence, which may reflect minimal residual interstitial edema, improved from  the prior chest radiograph. Borderline cardiomegaly.   Electronically Signed   By: Garald Balding M.D.   On: 10/13/2014 04:21   Paul Norlander, DO 10/15/2014, 8:20 AM PGY-1 [place] Intern pager: [phone], text pages welcome

## 2014-10-16 DIAGNOSIS — Z5900 Homelessness unspecified: Secondary | ICD-10-CM | POA: Insufficient documentation

## 2014-10-16 MED ORDER — CARVEDILOL 6.25 MG PO TABS: 18.7500 mg | ORAL_TABLET | Freq: Two times a day (BID) | ORAL | Status: DC

## 2014-10-16 NOTE — Progress Notes (Signed)
Subjective: Feeling better.  Some dizziness at the start on PT this morning but it resolved.   Objective: Vital signs in last 24 hours: Temp:  [96.2 F (35.7 C)-98.8 F (37.1 C)] 98.6 F (37 C) (12/28 0333) Pulse Rate:  [40-93] 93 (12/28 0920) Resp:  [18] 18 (12/28 0333) BP: (103-185)/(64-158) 185/158 mmHg (12/28 0920) SpO2:  [94 %-99 %] 98 % (12/28 0800) Weight:  [183 lb 10.3 oz (83.3 kg)] 183 lb 10.3 oz (83.3 kg) (12/28 0333) Last BM Date: 10/15/14  Intake/Output from previous day: 12/27 0701 - 12/28 0700 In: 360 [P.O.:360] Out: 425 [Urine:425] Intake/Output this shift:    Medications Current Facility-Administered Medications  Medication Dose Route Frequency Provider Last Rate Last Dose  . 0.9 %  sodium chloride infusion  250 mL Intravenous PRN Leone Haven, MD      . antiseptic oral rinse (CPC / CETYLPYRIDINIUM CHLORIDE 0.05%) solution 7 mL  7 mL Mouth Rinse BID Alveda Reasons, MD   7 mL at 10/15/14 2022  . aspirin chewable tablet 324 mg  324 mg Oral Daily Leone Haven, MD   324 mg at 10/15/14 0956  . atorvastatin (LIPITOR) tablet 20 mg  20 mg Oral Daily Leone Haven, MD   20 mg at 10/15/14 0956  . carvedilol (COREG) tablet 18.75 mg  18.75 mg Oral BID WC Evans Lance, MD   18.75 mg at 10/16/14 0754  . digoxin (LANOXIN) tablet 0.25 mg  0.25 mg Oral Daily Leone Haven, MD   0.25 mg at 10/15/14 0956  . furosemide (LASIX) tablet 20 mg  20 mg Oral Daily Leone Haven, MD   20 mg at 10/15/14 0956  . ipratropium-albuterol (DUONEB) 0.5-2.5 (3) MG/3ML nebulizer solution 3 mL  3 mL Nebulization Q4H PRN Leone Haven, MD      . lisinopril (PRINIVIL,ZESTRIL) tablet 5 mg  5 mg Oral Daily Leone Haven, MD   5 mg at 10/15/14 0956  . menthol-cetylpyridinium (CEPACOL) lozenge 3 mg  1 lozenge Oral PRN Evans Lance, MD   3 mg at 10/14/14 2200  . mometasone-formoterol (DULERA) 200-5 MCG/ACT inhaler 2 puff  2 puff Inhalation BID Leone Haven, MD   2  puff at 10/16/14 0759  . phenol (CHLORASEPTIC) mouth spray 2 spray  2 spray Mouth/Throat Q2H PRN Leone Haven, MD   2 spray at 10/13/14 1810  . rivaroxaban (XARELTO) tablet 20 mg  20 mg Oral Q supper Leone Haven, MD   20 mg at 10/15/14 2018  . sodium chloride 0.9 % injection 3 mL  3 mL Intravenous Q12H Leone Haven, MD   3 mL at 10/15/14 2022  . sodium chloride 0.9 % injection 3 mL  3 mL Intravenous PRN Leone Haven, MD      . tiotropium [facility]) inhalation capsule 18 mcg  18 mcg Inhalation Daily Leone Haven, MD   18 mcg at 10/16/14 0759    PE: General appearance: alert, cooperative and no distress Lungs: clear to auscultation bilaterally Heart: irregularly irregular rhythm. 1/6 sys MM at the apex.  Extremities: No LEE Pulses: 2+ and symmetric Skin: Warm and dry Neurologic: Grossly normal  Lab Results:   Recent Labs  10/14/14 0246 10/15/14 0235  WBC 9.3 9.1  HGB 12.2* 13.3  HCT 37.3* 40.9  PLT 219 273   BMET  Recent Labs  10/14/14 0246 10/15/14 0235  NA 135 138  K 4.3 4.3  CL  106 106  CO2 21 25  GLUCOSE 113* 103*  BUN 8 9  CREATININE 0.84 0.82  CALCIUM 8.5 8.9    Assessment/Plan    Atrial fibrillation with RVR- he was off his BB for about two weeks.  Continues in Afib(permenant) rate 80-90's.  Some bradycardia into the 30-40's overnight.  Coreg 18.75 BID, digoxin, xarelto.  No changes in therapy.  Ambulated well with PT this morning.     HFrEF (heart failure with reduced ejection fraction) On lisinopril 5.  Lasix 20mg  PO daily.  Appears euvolemic.  Net fluids -106ml/-2.7L.  Echo 09/16/14-EF 15%, severe MR, severe, TR.  Daily weight monitoring and low sodium diet.      Chest pain   Mildly elevated trop.  0.07.   Syncope(questionable):  Negative CT head   Colon cancer    He looks ready for DC.  [facility] tomorrow.    LOS: 3 days    HAGER, BRYAN PA-C 10/16/2014 9:28 AM  I have seen and examined  the patient along with HAGER, BRYAN PA-C.  I have reviewed the chart, notes and new data.  I agree with PA's note.  He seems to have a fairly narrow range of volume compensation between CHF and hypotension. Needs careful attention to sodium restriction and daily weights. 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 (Near)syncope on arrival was probably an episode of orthostatic hypotension after diarrhea.  PLAN: Ready for DC. Appt in HF clinic in AM. If substance abuse does not present as a recurring problem in next several months and he is compliant with meds and follow up, consider discussing ICD.  Sanda Klein, MD, [place] [phone] 10/16/2014, 11:22 AM

## 2014-10-16 NOTE — Care Management Note (Signed)
    Page 1 of 1   10/16/2014     2:05:33 PM CARE MANAGEMENT NOTE 10/16/2014  Patient:  MILIND, RAETHER   Account Number:  [id number]  Date Initiated:  10/16/2014  Documentation initiated by:  [facility]  Subjective/Objective Assessment:     Action/Plan:   Anticipated DC Date:  10/16/2014   Anticipated DC Plan:  [place]  CM consult      Choice offered to / List presented to:             Status of service:  Completed, signed off Medicare Important Message given?  YES (If response is "NO", the following Medicare IM given date fields will be blank) Date Medicare IM given:  10/16/2014 Medicare IM given by:  [facility] Date Additional Medicare IM given:   Additional Medicare IM given by:    Discharge Disposition:  HOME/SELF CARE  Per UR Regulation:    If discussed at Long Length of Stay Meetings, dates discussed:    Comments:  10-16-14 [place], RNBSN [phone] Patient has Medicaid.  Jennye Moccasin on [facility] list can get for 3 dollars.  Lives at the shelter in [place] , has a room there and states it is comfortable and has no problem there.  Has filled out all paperwork for housing, has an appt tomorrow with them.  Hopes to be discharged today. Understands will be on xaralto - and this is covered my medicaid and back on coreg.

## 2014-10-16 NOTE — Progress Notes (Signed)
Family Medicine Teaching Service Daily Progress Note Intern Pager: [phone]  Patient name: Paul Clayton Medical record number: [id number] Date of birth: 1946/03/28 Age: 68 y.o. Gender: male  Primary Care Provider: [name], MD Consultants: Cardiology Code Status: Full  Pt Overview and Major Events to Date:  12/25 - Admitted with chest pain/syncope in setting of afib with RVR  Assessment and Plan: [name] is a 68 y.o. male with a history of HFrEF, afib, and COPD presenting with chest pain, palpitations, and possible presyncope vs syncope.  Chest pain/afib with RVR: Chest pain likely related to afib. Troponins mildly elevated, and EKG shows afib with RVR, rate 125, with V5,V6 T wave inversions likely related to strain from afib with rvr in a patient with LVH. Must also consider ACS. Note recent echo with EF 15% with diffuse hypokinesis. CXR with no evidence of PNA or pneumothorax.  -Cardiology consulted, appreciate assistance -Troponins peaked at 0.07, now downtrending -Continue trending troponins -Coreg 18.75 BID, will titrate as indicated -Continue home xarelto  Syncope vs presyncope: Likely related to afib with RVR resulting in his dizziness. Considered CNS issue (CVA, head trauma), though patient with no neurological deficits on exam. CT head negative.  No recurrent episodes. -work-up for afib per above -monitor on tele -OOB with assistance -c/s PT  Social: ?reliable housing, medication assistance -c/s SW -c/s CM   Sore throat: mild erythema noted in posterior OP. No exudates noted. No tonsillar swelling noted. No hemoptysis or hematemesis.  -will continue to monitor -chlorseptic spray for discomfort  HFrEF: echo 09/16/14 with EF 15%. -continue home digoxin, lasix, and lisinopril -Coreg increased to 18.75 BID  COPD: stable on RA at this time. -continue home dulera, spiriva, and combivent  FEN/GI: Heart Healthy, SLIV Prophylaxis: on xarelto  Disposition:  Discharge pending HR control and ACS rule out. Care management involved to help with medications  Subjective:  No complaints this morning. No chest pain or shortness of breath. No palpitations. Ready for discharge.   Objective: Temp:  [96.2 F (35.7 C)-98.8 F (37.1 C)] 98.6 F (37 C) (12/28 0333) Pulse Rate:  [40-90] 90 (12/28 0333) Resp:  [18] 18 (12/28 0333) BP: (103-144)/(64-106) 115/82 mmHg (12/28 0333) SpO2:  [94 %-100 %] 99 % (12/28 0333) Weight:  [183 lb 10.3 oz (83.3 kg)] 183 lb 10.3 oz (83.3 kg) (12/28 0333) Physical Exam: General: well appearing male, NAD, sitting up in hospital bed Cardiovascular: irregularly irregular, regular rate, no murmurs appreciated Respiratory: NWOB, CTAB, no wheezes Abdomen: Soft, NT, ND, +BS Extremities: WWP, no edema, No cyanosis , 5/5 LE strength Neuro: no focal deficits, normal speech, follows commands  Laboratory:  Recent Labs Lab 10/13/14 0353 10/13/14 0653 10/14/14 0246 10/15/14 0235  WBC 9.5  --  9.3 9.1  HGB 12.5* 16.3 12.2* 13.3  HCT 38.9* 48.0 37.3* 40.9  PLT 231  --  219 273    Recent Labs Lab 10/11/14 0825  10/13/14 0353 10/13/14 0653 10/14/14 0246 10/15/14 0235  NA 139  < > 140 142 135 138  K 3.9  < > 4.7 4.5 4.3 4.3  CL  --   < > 108 107 106 106  CO2 25  --  25  --  21 25  BUN 11.6  < > 15 17 8 9   CREATININE 0.9  < > 0.95 0.90 0.84 0.82  CALCIUM 8.8  --  8.8  --  8.5 8.9  PROT 6.8  --  6.6  --   --   --  BILITOT 0.84  --  0.7  --   --   --   ALKPHOS 67  --  62  --   --   --   ALT 37  --  30  --   --   --   AST 29  --  33  --   --   --   GLUCOSE 142*  < > 131* 104* 113* 103*  < > = values in this interval not displayed.    Recent Labs Lab 10/13/14 1506 10/13/14 2145 10/15/14 0910 10/15/14 1532 10/15/14 1925  TROPONINI 0.04* 0.05* 0.07* <0.03 0.03    Imaging/Diagnostic Tests: None new  Dimas Chyle, MD 10/16/2014, 7:23 AM PGY-1 [place] Intern pager: [phone], text  pages welcome

## 2014-10-16 NOTE — Discharge Summary (Signed)
[facility] Discharge Summary  Patient name: Paul Clayton Medical record number: [id number] Date of birth: 03-Sep-1946 Age: 68 y.o. Gender: male Date of Admission: 10/13/2014  Date of Discharge:10/16/2014  Admitting Physician: Alveda Reasons, MD  Primary Care Provider: [name], MD Consultants: Cardiology  Indication for Hospitalization: Afib with RVR  Discharge Diagnoses/Problem List:  Chest Pain, Afib with RVR, syncope, HFrEF, COPD  Disposition: Home  Discharge Condition: Improved  Discharge Exam: Please see progress note for day of discharge  Brief Hospital Course:  Paul Clayton is a 68 y.o. male with a history of HFrEF, afib, and COPD who presented with chest pain, palpitations, and possible presyncope vs syncope.  His hospital course, by problem, is outlined below:  Chest pain/afib with RVR: Patient was found to be in RVR on presentation to the ED (HR to 120-140s) and was placed on diltiazem drip. He also endorsed chest pain, likely related to his atrial fibrillation. Initial troponin was slightly elevated to 0.04 and EKG revealed afib with RVR and T wave inversion in V5-6. Patient was noted to be off his home coreg for a 5 day period earlier this month and restarted at a lower dose of 12.25m bid. Cardiology was consulted and the patient was placed back on coreg and taken off the diltiazem drip. His coreg was slowly titrated and he was eventually able to have adequate rate control without hypotension on coreg and digoxin. Patient's troponin trended to undetectable levels by the time of discharge and he denied further episodes of chest pain, shortness of breath, or palpitations.  Syncope. Patient also reported feeling dizzy and feeling "like I went out" prior to admission. Likely due to afib with RVR vs orthostasis after episode of diarrhea. CT head was negative and patient had no neurologic deficits. Patient experienced no further presyncopal  symptoms while here.   Sore throat: Patient endorsed sore throat on presentation. Mild erythema was noted in posterior OP, with no exudates or tonsillar swelling noted. Patient was given chloraseptic spray for discomfort.   The patient's other chronic medical conditions, including HFrEF and COPD were stable during this admission and no changes were made to the patient's home medications at discharge.   Issues for Follow Up:  1) f/u HR and BP - increased coreg to 18.767mbid here 2) f/u medication adherence - Patient met with care management, xarelto will be covered by medicaid. 3) f/u housing - Patient currently lives in [place] have appointment for housing on 12/29  Significant Procedures: None  Significant Labs and Imaging:   Recent Labs Lab 10/13/14 0353 10/13/14 0653 10/14/14 0246 10/15/14 0235  WBC 9.5  --  9.3 9.1  HGB 12.5* 16.3 12.2* 13.3  HCT 38.9* 48.0 37.3* 40.9  PLT 231  --  219 273    Recent Labs Lab 10/11/14 0825  10/13/14 0353 10/13/14 0653 10/14/14 0246 10/15/14 0235  NA 139  --  140 142 135 138  K 3.9  < > 4.7 4.5 4.3 4.3  CL  --   --  108 107 106 106  CO2 25  --  25  --  21 25  GLUCOSE 142*  --  131* 104* 113* 103*  BUN 11.6  --  _0 CREATININE 0.9  --  0.95 0.90 0.84 0.82  CALCIUM 8.8  --  8.8  --  8.5 8.9  ALKPHOS 67  --  62  --   --   --  AST 29  --  33  --   --   --   ALT 37  --  30  --   --   --   ALBUMIN 3.0*  --  3.1*  --   --   --   < > = values in this interval not displayed.     Recent Labs Lab 10/13/14 1506 10/13/14 2145 10/15/14 0910 10/15/14 1532 10/15/14 1925  TROPONINI 0.04* 0.05* 0.07* <0.03 0.03   Ct Head Wo Contrast  10/13/2014   CLINICAL DATA:  Syncope. Fall hitting head. Patient reports anticoagulant use.  EXAM: CT HEAD WITHOUT CONTRAST  TECHNIQUE: Contiguous axial images were obtained from the base of the skull through the vertex without intravenous contrast.  COMPARISON:  None.  FINDINGS: No  evidence for acute infarction, hemorrhage, mass lesion, hydrocephalus, or extra-axial fluid. Mild cerebral and cerebellar atrophy. Mild vascular calcification without CT signs of proximal thrombosis. Calvarium intact. Scalp soft tissues grossly unremarkable. No sinus or mastoid disease.  IMPRESSION: Chronic changes as described.  No acute intracranial abnormality.  No skull fracture or intracranial hemorrhage.   Electronically Signed   By: Rolla Flatten M.D.   On: 10/13/2014 07:59   Dg Chest Port 1 View  10/13/2014   CLINICAL DATA:  Acute onset of left-sided chest pain, shortness of breath and dizziness. Personal history of smoking. Initial encounter.  EXAM: PORTABLE CHEST - 1 VIEW  COMPARISON:  Chest radiograph performed 10/05/2014, and CT of the chest performed 10/10/2014  FINDINGS: The lungs are well-aerated. Recently noted subsolid nodular opacities on CT are not well characterized on radiograph. There is suggestion of mild interstitial prominence, which may reflect minimal residual interstitial edema. There is no evidence of pleural effusion or pneumothorax.  The cardiomediastinal silhouette is borderline enlarged. No acute osseous abnormalities are seen.  IMPRESSION: Recently noted subsolid nodular opacities on CT are not well characterized on radiograph. Suggestion of mild interstitial prominence, which may reflect minimal residual interstitial edema, improved from the prior chest radiograph. Borderline cardiomegaly.   Electronically Signed   By: Garald Balding M.D.   On: 10/13/2014 04:21   Results/Tests Pending at Time of Discharge: None  Discharge Medications:    Medication List    TAKE these medications        albuterol-ipratropium 18-103 MCG/ACT inhaler  Commonly known as:  COMBIVENT  Inhale 1 puff into the lungs every 4 (four) hours as needed for wheezing or shortness of breath.     aspirin 81 MG chewable tablet  Chew 4 tablets (324 mg total) by mouth daily.     atorvastatin 20 MG  tablet  Commonly known as:  LIPITOR  Take 1 tablet (20 mg total) by mouth daily.     carvedilol 6.25 MG tablet  Commonly known as:  COREG  Take 3 tablets (18.75 mg total) by mouth 2 (two) times daily with a meal.     digoxin 0.25 MG tablet  Commonly known as:  LANOXIN  Take 1 tablet (0.25 mg total) by mouth daily.     furosemide 20 MG tablet  Commonly known as:  LASIX  Take 20 mg by mouth daily.     lisinopril 5 MG tablet  Commonly known as:  PRINIVIL,ZESTRIL  Take 1 tablet (5 mg total) by mouth daily.     mometasone-formoterol 200-5 MCG/ACT Aero  Commonly known as:  DULERA  Inhale 2 puffs into the lungs 2 (two) times daily.     rivaroxaban 20 MG Tabs tablet  Commonly known as:  XARELTO  Take 1 tablet (20 mg total) by mouth daily with supper.     tiotropium 18 MCG inhalation capsule  Commonly known as:  SPIRIVA  Place 18 mcg into inhaler and inhale daily.        Discharge Instructions: Please refer to Patient Instructions section of EMR for full details.  Patient was counseled important signs and symptoms that should prompt return to medical care, changes in medications, dietary instructions, activity restrictions, and follow up appointments.   Follow-Up Appointments: Follow-up Information    Follow up with Conni Slipper, MD On 10/27/2014.   Specialty:  Family Medicine   Why:  3:30pm, for hospital follow up   Contact information:   [place] [address] [phone]       Dimas Chyle, MD 10/16/2014, 2:22 PM PGY-1, [place]

## 2014-10-16 NOTE — Progress Notes (Signed)
Assessment unchanged. Discussed D/C instructions with pt including f/u appointments, HF education, bleeding precautions, and new medications. Verbalized understanding. RX given to pt. IV and tele removed. Pt left with belongings accompanied by RN.

## 2014-10-16 NOTE — Clinical Social Work Note (Signed)
CSW received referral for homeless issues.  Case discussed with case manager, he does have a place to go.  Plan is to discharge homeless shelter.  CSW to sign off please re-consult if social work needs arise.  Paul Clayton. [place], MSW, [place]

## 2014-10-16 NOTE — Evaluation (Addendum)
Physical Therapy Evaluation and Discharge Patient Details Name: Paul Clayton MRN: [id number] DOB: 02/10/1946 Today's Date: 10/16/2014   History of Present Illness  Paul Clayton is a 68 y.o. male with a history of HFrEF, afib, and COPD presenting with chest pain, palpitations, and possible presyncope vs syncope  Clinical Impression  Patient evaluated by Physical Therapy with no further acute PT needs identified. All education has been completed and the patient has no further questions. Tolerated challenging dynamic gait tasks without loss of balance. Prefers to use cane for stability, pt with mild antalgic gait pattern. Blood pressure initially high with dynamap but greatly improved when taken with manual cuff by RN. He was mildly dizzy upon standing but this resolved quickly and reported no further symptoms throughout therapy session. See below for any follow-up Physial Therapy or equipment needs. PT is signing off. Thank you for this referral.     Follow Up Recommendations No PT follow up    Equipment Recommendations  None recommended by PT    Recommendations for Other Services       Precautions / Restrictions Precautions Precautions: Fall Restrictions Weight Bearing Restrictions: No      Mobility  Bed Mobility Overal bed mobility: Modified Independent                Transfers Overall transfer level: Modified independent Equipment used: Straight cane             General transfer comment: performed from lowest bed setting without assist  Ambulation/Gait Ambulation/Gait assistance: Supervision Ambulation Distance (Feet): 400 Feet Assistive device: Straight cane Gait Pattern/deviations: Step-through pattern;Decreased step length - left;Decreased stance time - right;Antalgic Gait velocity: decreased   General Gait Details: very mildly antalgic pattern. Prefers to use cane but is able to ambulate without. Tolerated challenging dynamic gait tasks such as high  marching and backwards stepping without loss of balance. HR to 105. SpO2 97% on room air. No symptoms of dizziness or pre-syncope.  Stairs            Wheelchair Mobility    Modified Rankin (Stroke Patients Only)       Balance Overall balance assessment: Needs assistance Sitting-balance support: No upper extremity supported;Feet supported Sitting balance-Leahy Scale: Normal     Standing balance support: No upper extremity supported Standing balance-Leahy Scale: Good                               Pertinent Vitals/Pain Pain Assessment: No/denies pain    Home Living Family/patient expects to be discharged to:: Shelter/Homeless Paul Clayton home") Living Arrangements: Other (Comment) (stays at shelter) Available Help at Discharge: Other (Comment) (shelter workers) Type of Home: Homeless         Home Equipment: [place] - single point      Prior Function Level of Independence: Independent with assistive device(s)         Comments: uses cane for ambulation     Hand Dominance   Dominant Hand: Right    Extremity/Trunk Assessment   Upper Extremity Assessment: Defer to OT evaluation           Lower Extremity Assessment: Overall WFL for tasks assessed         Communication   Communication: No difficulties  Cognition Arousal/Alertness: Awake/alert Behavior During Therapy: WFL for tasks assessed/performed Overall Cognitive Status: Within Functional Limits for tasks assessed  General Comments General comments (skin integrity, edema, etc.): BP elevated with dynamap initially (RN took manually and pressure much lower - see vitals). Patient states he wants a power wheelchair; do not believe this is appropriate given his current level of function.    Exercises        Assessment/Plan    PT Assessment Patent does not need any further PT services  PT Diagnosis Abnormality of gait   PT Problem List Decreased activity  tolerance;Decreased mobility;Decreased balance  PT Treatment Interventions     PT Goals (Current goals can be found in the Care Plan section) Acute Rehab PT Goals Patient Stated Goal: none stated PT Goal Formulation: All assessment and education complete, DC therapy    Frequency     Barriers to discharge Inaccessible home environment;Decreased caregiver support homeless, currently staying at a shelter    Co-evaluation               End of Session   Activity Tolerance: Patient tolerated treatment well Patient left: with call bell/phone within reach;in chair Nurse Communication: Mobility status         Time: 0912 (-15 minutes while RN reassessed pt BP)-0951 (24 minutes total) PT Time Calculation (min) (ACUTE ONLY): 39 min   Charges:   PT Evaluation $Initial PT Evaluation Tier I: 1 Procedure PT Treatments $Gait Training: 8-22 mins   PT G CodesEllouise Newer 10/16/2014, 10:00 AM Elayne Snare, [place]

## 2014-10-17 ENCOUNTER — Encounter (HOSPITAL_COMMUNITY): Payer: Self-pay

## 2014-10-17 ENCOUNTER — Encounter: Payer: Self-pay | Admitting: Licensed Clinical Social Worker

## 2014-10-17 ENCOUNTER — Encounter (HOSPITAL_COMMUNITY): Payer: Self-pay | Admitting: Cardiology

## 2014-10-17 ENCOUNTER — Ambulatory Visit (HOSPITAL_COMMUNITY)
Admission: RE | Admit: 2014-10-17 | Discharge: 2014-10-17 | Disposition: A | Discharge status: Home / Self Care | Payer: Medicare Other | Source: Ambulatory Visit | Attending: Internal Medicine | Admitting: Internal Medicine

## 2014-10-17 VITALS — BP 100/68 | HR 89 | Wt 187.8 lb

## 2014-10-17 DIAGNOSIS — I4891 Unspecified atrial fibrillation: Secondary | ICD-10-CM

## 2014-10-17 DIAGNOSIS — I509 Heart failure, unspecified: Secondary | ICD-10-CM | POA: Diagnosis not present

## 2014-10-17 DIAGNOSIS — C189 Malignant neoplasm of colon, unspecified: Secondary | ICD-10-CM | POA: Insufficient documentation

## 2014-10-17 DIAGNOSIS — I429 Cardiomyopathy, unspecified: Secondary | ICD-10-CM | POA: Diagnosis not present

## 2014-10-17 DIAGNOSIS — Z79899 Other long term (current) drug therapy: Secondary | ICD-10-CM | POA: Diagnosis not present

## 2014-10-17 DIAGNOSIS — I5020 Unspecified systolic (congestive) heart failure: Secondary | ICD-10-CM

## 2014-10-17 DIAGNOSIS — J449 Chronic obstructive pulmonary disease, unspecified: Secondary | ICD-10-CM | POA: Insufficient documentation

## 2014-10-17 DIAGNOSIS — I482 Chronic atrial fibrillation: Secondary | ICD-10-CM | POA: Diagnosis not present

## 2014-10-17 DIAGNOSIS — I5022 Chronic systolic (congestive) heart failure: Secondary | ICD-10-CM | POA: Insufficient documentation

## 2014-10-17 DIAGNOSIS — Z7901 Long term (current) use of anticoagulants: Secondary | ICD-10-CM | POA: Insufficient documentation

## 2014-10-17 LAB — HIV ANTIBODY (ROUTINE TESTING W REFLEX): HIV 1&2 Ab, 4th Generation: NONREACTIVE

## 2014-10-17 MED ORDER — SPIRONOLACTONE 25 MG PO TABS: 12.5000 mg | ORAL_TABLET | Freq: Every day | ORAL | Status: DC

## 2014-10-17 MED ORDER — AMIODARONE HCL 200 MG PO TABS: 200.0000 mg | ORAL_TABLET | Freq: Two times a day (BID) | ORAL | Status: DC

## 2014-10-17 MED ORDER — RIVAROXABAN 20 MG PO TABS: 20.0000 mg | ORAL_TABLET | Freq: Every day | ORAL | Status: DC

## 2014-10-17 NOTE — Addendum Note (Signed)
Encounter addended by: Larey Dresser, MD on: 10/17/2014 11:42 PM<BR>     Documentation filed: Follow-up Section, LOS Section

## 2014-10-17 NOTE — Patient Instructions (Addendum)
Your physician has requested that you have a cardiac catheterization. Cardiac catheterization is used to diagnose and/or treat various heart conditions. Doctors may recommend this procedure for a number of different reasons. The most common reason is to evaluate chest pain. Chest pain can be a symptom of coronary artery disease (CAD), and cardiac catheterization can show whether plaque is narrowing or blocking your heart's arteries. This procedure is also used to evaluate the valves, as well as measure the blood flow and oxygen levels in different parts of your heart. For further information please visit [email or web address]. Please follow instruction sheet, as given.  START Spironolactone 12.5 mg daily START Amiodarone 200 mg twice a day STOP Asprin   Labs today  Do the following things EVERYDAY: 1) Weigh yourself in the morning before breakfast. Write it down and keep it in a log. 2) Take your medicines as prescribed 3) Eat low salt foods-Limit salt (sodium) to 2000 mg per day.  4) Stay as active as you can everyday 5) Limit all fluids for the day to less than 2 liters 6)

## 2014-10-17 NOTE — Progress Notes (Signed)
Patient ID: Paul Clayton, male   DOB: May 29, 1946, 68 y.o.   MRN: [id number] PCP: Dr. Berkley Harvey  68 yo with history of COPD, ETOH and cocaine abuse, HCV, colon cancer, persistent atrial fibrillation, and chronic systolic CHF presents for cardiology evaluation.  He moved recently to [place] from [place].  He says that he has had a cardiomyopathy known since 2012. This has been presumed to be due to cocaine and ETOH abuse.  He is also in persistent atrial fibrillation.  He says that he has been cardioverted twice, the last time was a few months ago in [place].  He is not sure whether he went back into NSR or not.  He is currently in atrial fibrillation.  He says that he quit ETOH, cocaine, and smoking in 10/15.  He was admitted to [facility] in 12/15 with atrial fibrillation and RVR. He had been out of his Coreg x 2 weeks.  He was diuresed and rate-controlled.  He was just discharged.  He had had an episode of syncope prior to admission that was thought to be orthostatic given low BP in the setting of atrial fibrillation with RVR.  Echo in 11/15 showed EF 15% with severe MR and TR and moderately decreased RV systolic function.    Currently, he is stable symptomatically.  He remains in atrial fibrillation but it is rate-controlled.  He is short of breath walking up stairs.  He does ok on flat ground if he walks slowly.  +orthopnea, needs 2 pillows.  No PND.  No chest pain.  No further syncope or lightheadedness.   ECG: atrial fibrillation at 105, LVH with repolarization abnormality, QRS not wide.   Labs (12/15): K 4.3, creatinine 0.82, AST normal, ALT 60, HCT 40.9, digoxin 0.7, BNP 4401  PMH: 1. COPD: Quit smoking in 10/15.  2. Cocaine abuse: Quit in 10/15.  3. ETOH abuse: Quit ETOH in 10/15.  4. Atrial fibrillation: Chronic.  Reports DCCV x 2 in [place], last was a few months ago.  He is not sure whether he went back into NSR or not.  5. HCV 6. 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  He sees oncology here and will be getting a PET scan.  7. H/o syncope: Thought to be orthostatic.  8. Cardiomyopathy: Known since 2012.  Never had cath.  Echo (11/15) with EF 15%, diffuse hypokinesis, severe LV dilation, severe MR (likely functional), moderate to severe LAE, dilated RV with moderately decreased systolic function, severe TR.  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   SH: Prior ETOH, smoking, and cocaine abuse. Says he quit in 10/15.  Moved here from [place].  Daughter lives in [place].   FH: No premature CAD.   ROS: All systems reviewed and negative except as per HPI.   Current Outpatient Prescriptions  Medication Sig Dispense Refill  . albuterol-ipratropium (COMBIVENT) 18-103 MCG/ACT inhaler Inhale 1 puff into the lungs every 4 (four) hours as needed for wheezing or shortness of breath. 1 Inhaler 1  . atorvastatin (LIPITOR) 20 MG tablet Take 1 tablet (20 mg total) by mouth daily. 30 tablet 0  . carvedilol (COREG) 6.25 MG tablet Take 3 tablets (18.75 mg total) by mouth 2 (two) times daily with a meal. (Patient taking differently: Take 12.5 mg by mouth 2 (two) times daily with a meal. ) 180 tablet 2  . digoxin (LANOXIN) 0.25 MG tablet Take 1 tablet (0.25 mg total) by mouth daily. 30 tablet  0  . furosemide (LASIX) 20 MG tablet Take 20 mg by mouth daily.    Marland Kitchen lisinopril (PRINIVIL,ZESTRIL) 5 MG tablet Take 1 tablet (5 mg total) by mouth daily. 30 tablet 0  . mometasone-formoterol (DULERA) 200-5 MCG/ACT AERO Inhale 2 puffs into the lungs 2 (two) times daily. (Patient taking differently: Inhale 2 puffs into the lungs daily. ) 1 Inhaler 0  . rivaroxaban (XARELTO) 20 MG TABS tablet Take 1 tablet (20 mg total) by mouth daily with supper. 30 tablet 3  . tiotropium (SPIRIVA) 18 MCG inhalation capsule Place 18 mcg into inhaler and inhale daily.    Marland Kitchen amiodarone (PACERONE) 200 MG tablet Take 1 tablet (200 mg total) by mouth 2 (two) times  daily. 60 tablet 3  . spironolactone (ALDACTONE) 25 MG tablet Take 0.5 tablets (12.5 mg total) by mouth daily. 15 tablet 3   No current facility-administered medications for this encounter.   BP 100/68 mmHg  Pulse 89  Wt 187 lb 12.8 oz (85.186 kg)  SpO2 99% General: NAD Neck: No JVD, no thyromegaly or thyroid nodule.  Lungs: Clear to auscultation bilaterally with normal respiratory effort. CV: Nondisplaced PMI.  Heart irregular S1/S2, no S3/S4, 2/6 HSM LLSB and apex.  No peripheral edema.  No carotid bruit.  Normal pedal pulses.  Abdomen: Soft, nontender, no hepatosplenomegaly, no distention.  Skin: Intact without lesions or rashes.  Neurologic: Alert and oriented x 3.  Psych: Normal affect. Extremities: No clubbing or cyanosis.  HEENT: Normal.   Assessment/Plan: 1. Chronic systolic CHF: Cardiomyopathy with LV EF 15% and moderate RV dysfunction.  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  He says that he has quit using cocaine and drinking ETOH since 10/15.  He does not remember ever having cardiac cath.  Probably NYHA class IIII.  He is not volume overloaded on exam.   - Continue Lasix 20 mg daily for now.  - Continue Coreg 12.5 mg bid and lisinopril 5 mg daily.  - Add spironolactone 12.5 mg daily, will need BMET in 10 days.  - I will arrange for RHC/LHC to assess filling pressures/cardiac output and to rule out significant CAD.  Hold Xarelto 2 days prior.  - If he can stay off cocaine and ETOH, he will be an ICD candidate.  I will repeat echo in 5/15 after a period of medical management and an attempt to get him back into NSR.  If EF remains low, he will need ICD.  - If he avoids substance abuse and follows up regularly, he may be a candidate for advanced therapies in the future.  - Check HIV.  2. Atrial fibrillation: Persistent.  He has had cardioversions in the past.  Given MR/TR and LA dilation, maintenance of NSR will be  difficult.  I am going to give him 1 try to regain NSR using an antiarrhythmic.  I will start him on amiodarone 200 mg bid.  He will restart Xarelto after next week's cath and I will cardiovert him after 4 weeks of continuous Xarelto.  Will need to follow LFTs closely on Xarelto. Given Xarelto use, he can stop ASA.  3. HCV: ALT mildly elevated on most recent LFTs.   4. COPD: No longer smoking, uses Spiriva.  5. Colon cancer: Ongoing evaluation by oncology.   Loralie Champagne 10/17/2014

## 2014-10-18 ENCOUNTER — Telehealth (HOSPITAL_COMMUNITY): Payer: Self-pay | Admitting: Cardiology

## 2014-10-18 NOTE — Progress Notes (Signed)
CSW met with patient in the clinic to assist with medication assistance. Patient reports he has Medicare and Medicaid alhtough a specific medication was not previously covered when living in [place]. Patient reports he is now living in the [place] area and will use local pharmacy. HF staff assisted with sending prescriptions to pharmacy and medication should be covered with insurance and patient will make $3-4 co-pay. Patient verbalizes understanding and states he will be able to cover the cost of the co-pays for all his medications. CSW discussed Medicare/Medicaid coverage and patient denies any other concerns at this time. CSW will continue to be available should anything further be needed. Raquel Sarna, [place]

## 2014-10-18 NOTE — Addendum Note (Signed)
Encounter addended by: Asencion Gowda, CCT on: 10/18/2014  1:54 PM<BR>     Documentation filed: Charges VN

## 2014-10-18 NOTE — Telephone Encounter (Signed)
Pt scheduled for R/L heart cath on 10/23/14 with [facility] [id number] icd 10 code- i50.22 With pts current insurance - no pre cert required

## 2014-10-22 ENCOUNTER — Emergency Department (HOSPITAL_COMMUNITY): Payer: Medicare Other

## 2014-10-22 ENCOUNTER — Other Ambulatory Visit: Payer: Self-pay

## 2014-10-22 ENCOUNTER — Encounter (HOSPITAL_COMMUNITY): Payer: Self-pay | Admitting: Emergency Medicine

## 2014-10-22 ENCOUNTER — Emergency Department (HOSPITAL_COMMUNITY)
Admission: EM | Admit: 2014-10-22 | Discharge: 2014-10-22 | Disposition: A | Discharge status: Home / Self Care | Payer: Medicare Other | Attending: Emergency Medicine | Admitting: Emergency Medicine

## 2014-10-22 DIAGNOSIS — Z7951 Long term (current) use of inhaled steroids: Secondary | ICD-10-CM | POA: Diagnosis not present

## 2014-10-22 DIAGNOSIS — Z87891 Personal history of nicotine dependence: Secondary | ICD-10-CM | POA: Insufficient documentation

## 2014-10-22 DIAGNOSIS — S098XXA Other specified injuries of head, initial encounter: Secondary | ICD-10-CM | POA: Diagnosis not present

## 2014-10-22 DIAGNOSIS — Z7901 Long term (current) use of anticoagulants: Secondary | ICD-10-CM | POA: Insufficient documentation

## 2014-10-22 DIAGNOSIS — S0990XA Unspecified injury of head, initial encounter: Secondary | ICD-10-CM | POA: Insufficient documentation

## 2014-10-22 DIAGNOSIS — Z8619 Personal history of other infectious and parasitic diseases: Secondary | ICD-10-CM | POA: Insufficient documentation

## 2014-10-22 DIAGNOSIS — Z043 Encounter for examination and observation following other accident: Secondary | ICD-10-CM | POA: Diagnosis not present

## 2014-10-22 DIAGNOSIS — I509 Heart failure, unspecified: Secondary | ICD-10-CM | POA: Insufficient documentation

## 2014-10-22 DIAGNOSIS — Y9289 Other specified places as the place of occurrence of the external cause: Secondary | ICD-10-CM | POA: Insufficient documentation

## 2014-10-22 DIAGNOSIS — Y9389 Activity, other specified: Secondary | ICD-10-CM | POA: Diagnosis not present

## 2014-10-22 DIAGNOSIS — Z85038 Personal history of other malignant neoplasm of large intestine: Secondary | ICD-10-CM | POA: Diagnosis not present

## 2014-10-22 DIAGNOSIS — W19XXXA Unspecified fall, initial encounter: Secondary | ICD-10-CM

## 2014-10-22 DIAGNOSIS — I10 Essential (primary) hypertension: Secondary | ICD-10-CM | POA: Diagnosis not present

## 2014-10-22 DIAGNOSIS — I517 Cardiomegaly: Secondary | ICD-10-CM | POA: Diagnosis not present

## 2014-10-22 DIAGNOSIS — J441 Chronic obstructive pulmonary disease with (acute) exacerbation: Secondary | ICD-10-CM | POA: Diagnosis not present

## 2014-10-22 DIAGNOSIS — Z79899 Other long term (current) drug therapy: Secondary | ICD-10-CM | POA: Insufficient documentation

## 2014-10-22 DIAGNOSIS — R0602 Shortness of breath: Secondary | ICD-10-CM | POA: Diagnosis not present

## 2014-10-22 DIAGNOSIS — Y998 Other external cause status: Secondary | ICD-10-CM | POA: Insufficient documentation

## 2014-10-22 NOTE — Discharge Instructions (Signed)
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
## 2014-10-22 NOTE — ED Notes (Signed)
Pt made aware to return if symptoms worsen or if any life threatening symptoms occur.   

## 2014-10-23 ENCOUNTER — Encounter (HOSPITAL_COMMUNITY)
Admission: RE | Disposition: A | Discharge status: Home / Self Care | Payer: Self-pay | Source: Ambulatory Visit | Attending: Cardiology

## 2014-10-23 ENCOUNTER — Telehealth: Payer: Self-pay | Admitting: Family Medicine

## 2014-10-23 ENCOUNTER — Encounter (HOSPITAL_COMMUNITY): Payer: Self-pay | Admitting: Cardiology

## 2014-10-23 ENCOUNTER — Ambulatory Visit (HOSPITAL_COMMUNITY)
Admission: RE | Admit: 2014-10-23 | Discharge: 2014-10-23 | Disposition: A | Discharge status: Home / Self Care | Payer: Medicare Other | Source: Ambulatory Visit | Attending: Cardiology | Admitting: Cardiology

## 2014-10-23 DIAGNOSIS — I482 Chronic atrial fibrillation: Secondary | ICD-10-CM | POA: Diagnosis not present

## 2014-10-23 DIAGNOSIS — I2510 Atherosclerotic heart disease of native coronary artery without angina pectoris: Secondary | ICD-10-CM | POA: Diagnosis not present

## 2014-10-23 DIAGNOSIS — I5022 Chronic systolic (congestive) heart failure: Secondary | ICD-10-CM | POA: Insufficient documentation

## 2014-10-23 DIAGNOSIS — Z79899 Other long term (current) drug therapy: Secondary | ICD-10-CM | POA: Diagnosis not present

## 2014-10-23 DIAGNOSIS — I429 Cardiomyopathy, unspecified: Secondary | ICD-10-CM | POA: Diagnosis not present

## 2014-10-23 DIAGNOSIS — B1920 Unspecified viral hepatitis C without hepatic coma: Secondary | ICD-10-CM | POA: Insufficient documentation

## 2014-10-23 DIAGNOSIS — J449 Chronic obstructive pulmonary disease, unspecified: Secondary | ICD-10-CM | POA: Diagnosis not present

## 2014-10-23 DIAGNOSIS — Z87891 Personal history of nicotine dependence: Secondary | ICD-10-CM | POA: Diagnosis not present

## 2014-10-23 DIAGNOSIS — Z85038 Personal history of other malignant neoplasm of large intestine: Secondary | ICD-10-CM | POA: Diagnosis not present

## 2014-10-23 DIAGNOSIS — Z7901 Long term (current) use of anticoagulants: Secondary | ICD-10-CM | POA: Diagnosis not present

## 2014-10-23 DIAGNOSIS — I509 Heart failure, unspecified: Secondary | ICD-10-CM | POA: Diagnosis present

## 2014-10-23 HISTORY — PX: CARDIAC CATHETERIZATION: SHX172

## 2014-10-23 HISTORY — PX: LEFT AND RIGHT HEART CATHETERIZATION WITH CORONARY ANGIOGRAM: SHX5449

## 2014-10-23 LAB — PROTIME-INR
INR: 1.07 (ref 0.00–1.49)
Prothrombin Time: 14 seconds (ref 11.6–15.2)

## 2014-10-23 SURGERY — LEFT AND RIGHT HEART CATHETERIZATION WITH CORONARY ANGIOGRAM
Anesthesia: LOCAL

## 2014-10-23 MED ORDER — SODIUM CHLORIDE 0.9 % IJ SOLN: 3.0000 mL | Freq: Two times a day (BID) | INTRAMUSCULAR | Status: DC

## 2014-10-23 MED ORDER — LIDOCAINE HCL (PF) 1 % IJ SOLN
INTRAMUSCULAR | Status: AC
  Filled 2014-10-23: qty 30

## 2014-10-23 MED ORDER — SODIUM CHLORIDE 0.9 % IJ SOLN: 3.0000 mL | INTRAMUSCULAR | Status: DC | PRN

## 2014-10-23 MED ORDER — SODIUM CHLORIDE 0.9 % IV SOLN
INTRAVENOUS | Status: DC
Start: 2014-10-24 — End: 2014-10-23

## 2014-10-23 MED ORDER — SODIUM CHLORIDE 0.9 % IV SOLN: 250.0000 mL | INTRAVENOUS | Status: DC | PRN

## 2014-10-23 MED ORDER — ONDANSETRON HCL 4 MG/2ML IJ SOLN: 4.0000 mg | Freq: Four times a day (QID) | INTRAMUSCULAR | Status: DC | PRN

## 2014-10-23 MED ORDER — HEPARIN (PORCINE) IN NACL 2-0.9 UNIT/ML-% IJ SOLN
INTRAMUSCULAR | Status: AC
  Filled 2014-10-23: qty 1000

## 2014-10-23 MED ORDER — ASPIRIN 81 MG PO CHEW
CHEWABLE_TABLET | ORAL | Status: AC
  Filled 2014-10-23: qty 1

## 2014-10-23 MED ORDER — ACETAMINOPHEN 325 MG PO TABS: 650.0000 mg | ORAL_TABLET | ORAL | Status: DC | PRN

## 2014-10-23 MED ORDER — FENTANYL CITRATE 0.05 MG/ML IJ SOLN
INTRAMUSCULAR | Status: AC
  Filled 2014-10-23: qty 2

## 2014-10-23 MED ORDER — MIDAZOLAM HCL 2 MG/2ML IJ SOLN
INTRAMUSCULAR | Status: AC
  Filled 2014-10-23: qty 2

## 2014-10-23 MED ORDER — HEPARIN SODIUM (PORCINE) 1000 UNIT/ML IJ SOLN
INTRAMUSCULAR | Status: AC
  Filled 2014-10-23: qty 1

## 2014-10-23 MED ORDER — VERAPAMIL HCL 2.5 MG/ML IV SOLN
INTRAVENOUS | Status: AC
  Filled 2014-10-23: qty 2

## 2014-10-23 MED ORDER — ASPIRIN 81 MG PO CHEW
81.0000 mg | CHEWABLE_TABLET | ORAL | Status: AC
  Administered 2014-10-23: 81 mg via ORAL

## 2014-10-23 MED ORDER — NITROGLYCERIN 1 MG/10 ML FOR IR/CATH LAB
INTRA_ARTERIAL | Status: AC
  Filled 2014-10-23: qty 10

## 2014-10-23 NOTE — Progress Notes (Signed)
Client started coughing and O2 started at 2l/min at client's request and Jennifer,RN notified

## 2014-10-23 NOTE — Progress Notes (Signed)
Site area: left anticubital  Site Prior to Removal:  Level 0  Pressure Applied For 30  MINUTES    Minutes Beginning at 1320  Manual:   Yes.    Patient Status During Pull:  stable  Post Pull anticubital Site:  Level 0  Post Pull Instructions Given:  Yes.    Post Pull Pulses Present:  Yes.    Dressing Applied:  Yes.    Comments:  Pt tolerated well

## 2014-10-23 NOTE — CV Procedure (Signed)
    Cardiac Catheterization Procedure Note  Name: WHALEN TROMPETER MRN: [id number] DOB: 1946/09/25  Procedure: Right Heart Cath, Left Heart Cath, Selective Coronary Angiography, LV angiography  Indication: Cardiomyopathy, chronic systolic CHF   Procedural Details: The left brachial and radial areas were prepped, draped, and anesthetized with 1% lidocaine. There was a pre-existing IV in the left brachial area that was changed out for a 5 French venous sheath. A Swan-Ganz catheter was used for the right heart catheterization. Standard protocol was followed for recording of right heart pressures and sampling of oxygen saturations. Fick cardiac output was calculated. The left radial artery was accessed using modified Seldinger technique and a 6 French arterial sheath was placed.  The patient received 3 mg IA verapamil and weight-based IV heparin.  Standard Judkins catheters were used for selective coronary angiography and left ventriculography. There were no immediate procedural complications. The patient was transferred to the post catheterization recovery area for further monitoring.  Procedural Findings: Hemodynamics (mmHg) RA mean 9 RV 44/9 PA 44/26, mean 35 PCWP mean 21 LV 107/22 AO 108/82  Oxygen saturations: PA 63% AO 98%  Cardiac Output (Fick) 4.31  Cardiac Index (Fick) 2.1 PVR 3.2 WU  Coronary angiography: Coronary dominance: right  Left mainstem: 20% distal tapering.    Left anterior descending (LAD): 30% ostial LAD stenosis.  Large high D1.  Luminal irregularities.   Left circumflex (LCx): Small vessel with 30% ostial stenosis.  Luminal irregularities.   Right coronary artery (RCA): 30% proximal RCA stenosis, luminal irregularities.  Left ventriculography: EF 20-25%, diffuse hypokinesis.  Final Conclusions:  Nonischemic cardiomyopathy.  Mildly elevated filling pressures.  Low cardiac output but not markedly low.  He can increase Lasix to 40 mg daily and will see him  back in office in 2 wks.   Loralie Champagne 10/23/2014, 12:28 PM

## 2014-10-23 NOTE — Telephone Encounter (Signed)
Needs refill on blood thinners. Is having heart catherization today

## 2014-10-23 NOTE — Interval H&P Note (Signed)
Cath Lab Visit (complete for each Cath Lab visit)  Clinical Evaluation Leading to the Procedure:   ACS: No.  Non-ACS:    Anginal Classification: CCS III  Anti-ischemic medical therapy: Minimal Therapy (1 class of medications)  Non-Invasive Test Results: No non-invasive testing performed  Prior CABG: No previous CABG      History and Physical Interval Note:  10/23/2014 11:09 AM  Paul Clayton  has presented today for surgery, with the diagnosis of heart failure  The various methods of treatment have been discussed with the patient and family. After consideration of risks, benefits and other options for treatment, the patient has consented to  Procedure(s): LEFT AND RIGHT HEART CATHETERIZATION WITH CORONARY ANGIOGRAM (N/A) as a surgical intervention .  The patient's history has been reviewed, patient examined, no change in status, stable for surgery.  I have reviewed the patient's chart and labs.  Questions were answered to the patient's satisfaction.     Anndrea Mihelich [employer]

## 2014-10-23 NOTE — Discharge Instructions (Signed)
Radial Site Care °Refer to this sheet in the next few weeks. These instructions provide you with information on caring for yourself after your procedure. Your caregiver may also give you more specific instructions. Your treatment has been planned according to current medical practices, but problems sometimes occur. Call your caregiver if you have any problems or questions after your procedure. °HOME CARE INSTRUCTIONS °· You may shower the day after the procedure. Remove the bandage (dressing) and gently wash the site with plain soap and water. Gently pat the site dry. °· Do not apply powder or lotion to the site. °· Do not submerge the affected site in water for 3 to 5 days. °· Inspect the site at least twice daily. °· Do not flex or bend the affected arm for 24 hours. °· No lifting over 5 pounds (2.3 kg) for 5 days after your procedure. °· Do not drive home if you are discharged the same day of the procedure. Have someone else drive you. °· You may drive 24 hours after the procedure unless otherwise instructed by your caregiver. °· Do not operate machinery or power tools for 24 hours. °· A responsible adult should be with you for the first 24 hours after you arrive home. °What to expect: °· Any bruising will usually fade within 1 to 2 weeks. °· Blood that collects in the tissue (hematoma) may be painful to the touch. It should usually decrease in size and tenderness within 1 to 2 weeks. °SEEK IMMEDIATE MEDICAL CARE IF: °· You have unusual pain at the radial site. °· You have redness, warmth, swelling, or pain at the radial site. °· You have drainage (other than a small amount of blood on the dressing). °· You have chills. °· You have a fever or persistent symptoms for more than 72 hours. °· You have a fever and your symptoms suddenly get worse. °· Your arm becomes pale, cool, tingly, or numb. °· You have heavy bleeding from the site. Hold pressure on the site. °Document Released: 11/08/2010 Document Revised:  12/29/2011 Document Reviewed: 11/08/2010 °ExitCare® Patient Information ©2015 [employer]. This information is not intended to replace advice given to you by your health care provider. Make sure you discuss any questions you have with your health care provider. ° °

## 2014-10-23 NOTE — H&P (View-Only) (Signed)
Patient ID: Paul Clayton, male   DOB: 02-Sep-1946, 69 y.o.   MRN: [id number] PCP: Dr. Berkley Harvey  69 yo with history of COPD, ETOH and cocaine abuse, HCV, colon cancer, persistent atrial fibrillation, and chronic systolic CHF presents for cardiology evaluation.  He moved recently to [place] from [place].  He says that he has had a cardiomyopathy known since 2012. This has been presumed to be due to cocaine and ETOH abuse.  He is also in persistent atrial fibrillation.  He says that he has been cardioverted twice, the last time was a few months ago in [place].  He is not sure whether he went back into NSR or not.  He is currently in atrial fibrillation.  He says that he quit ETOH, cocaine, and smoking in 10/15.  He was admitted to [facility] in 12/15 with atrial fibrillation and RVR. He had been out of his Coreg x 2 weeks.  He was diuresed and rate-controlled.  He was just discharged.  He had had an episode of syncope prior to admission that was thought to be orthostatic given low BP in the setting of atrial fibrillation with RVR.  Echo in 11/15 showed EF 15% with severe MR and TR and moderately decreased RV systolic function.    Currently, he is stable symptomatically.  He remains in atrial fibrillation but it is rate-controlled.  He is short of breath walking up stairs.  He does ok on flat ground if he walks slowly.  +orthopnea, needs 2 pillows.  No PND.  No chest pain.  No further syncope or lightheadedness.   ECG: atrial fibrillation at 105, LVH with repolarization abnormality, QRS not wide.   Labs (12/15): K 4.3, creatinine 0.82, AST normal, ALT 60, HCT 40.9, digoxin 0.7, BNP 4401  PMH: 1. COPD: Quit smoking in 10/15.  2. Cocaine abuse: Quit in 10/15.  3. ETOH abuse: Quit ETOH in 10/15.  4. Atrial fibrillation: Chronic.  Reports DCCV x 2 in [place], last was a few months ago.  He is not sure whether he went back into NSR or not.  5. HCV 6. 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  He sees oncology here and will be getting a PET scan.  7. H/o syncope: Thought to be orthostatic.  8. Cardiomyopathy: Known since 2012.  Never had cath.  Echo (11/15) with EF 15%, diffuse hypokinesis, severe LV dilation, severe MR (likely functional), moderate to severe LAE, dilated RV with moderately decreased systolic function, severe TR.  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   SH: Prior ETOH, smoking, and cocaine abuse. Says he quit in 10/15.  Moved here from [place].  Daughter lives in [place].   FH: No premature CAD.   ROS: All systems reviewed and negative except as per HPI.   Current Outpatient Prescriptions  Medication Sig Dispense Refill  . albuterol-ipratropium (COMBIVENT) 18-103 MCG/ACT inhaler Inhale 1 puff into the lungs every 4 (four) hours as needed for wheezing or shortness of breath. 1 Inhaler 1  . atorvastatin (LIPITOR) 20 MG tablet Take 1 tablet (20 mg total) by mouth daily. 30 tablet 0  . carvedilol (COREG) 6.25 MG tablet Take 3 tablets (18.75 mg total) by mouth 2 (two) times daily with a meal. (Patient taking differently: Take 12.5 mg by mouth 2 (two) times daily with a meal. ) 180 tablet 2  . digoxin (LANOXIN) 0.25 MG tablet Take 1 tablet (0.25 mg total) by mouth daily. 30 tablet  0  . furosemide (LASIX) 20 MG tablet Take 20 mg by mouth daily.    Marland Kitchen lisinopril (PRINIVIL,ZESTRIL) 5 MG tablet Take 1 tablet (5 mg total) by mouth daily. 30 tablet 0  . mometasone-formoterol (DULERA) 200-5 MCG/ACT AERO Inhale 2 puffs into the lungs 2 (two) times daily. (Patient taking differently: Inhale 2 puffs into the lungs daily. ) 1 Inhaler 0  . rivaroxaban (XARELTO) 20 MG TABS tablet Take 1 tablet (20 mg total) by mouth daily with supper. 30 tablet 3  . tiotropium (SPIRIVA) 18 MCG inhalation capsule Place 18 mcg into inhaler and inhale daily.    Marland Kitchen amiodarone (PACERONE) 200 MG tablet Take 1 tablet (200 mg total) by mouth 2 (two) times  daily. 60 tablet 3  . spironolactone (ALDACTONE) 25 MG tablet Take 0.5 tablets (12.5 mg total) by mouth daily. 15 tablet 3   No current facility-administered medications for this encounter.   BP 100/68 mmHg  Pulse 89  Wt 187 lb 12.8 oz (85.186 kg)  SpO2 99% General: NAD Neck: No JVD, no thyromegaly or thyroid nodule.  Lungs: Clear to auscultation bilaterally with normal respiratory effort. CV: Nondisplaced PMI.  Heart irregular S1/S2, no S3/S4, 2/6 HSM LLSB and apex.  No peripheral edema.  No carotid bruit.  Normal pedal pulses.  Abdomen: Soft, nontender, no hepatosplenomegaly, no distention.  Skin: Intact without lesions or rashes.  Neurologic: Alert and oriented x 3.  Psych: Normal affect. Extremities: No clubbing or cyanosis.  HEENT: Normal.   Assessment/Plan: 1. Chronic systolic CHF: Cardiomyopathy with LV EF 15% and moderate RV dysfunction.  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  He says that he has quit using cocaine and drinking ETOH since 10/15.  He does not remember ever having cardiac cath.  Probably NYHA class IIII.  He is not volume overloaded on exam.   - Continue Lasix 20 mg daily for now.  - Continue Coreg 12.5 mg bid and lisinopril 5 mg daily.  - Add spironolactone 12.5 mg daily, will need BMET in 10 days.  - I will arrange for RHC/LHC to assess filling pressures/cardiac output and to rule out significant CAD.  Hold Xarelto 2 days prior.  - If he can stay off cocaine and ETOH, he will be an ICD candidate.  I will repeat echo in 5/15 after a period of medical management and an attempt to get him back into NSR.  If EF remains low, he will need ICD.  - If he avoids substance abuse and follows up regularly, he may be a candidate for advanced therapies in the future.  - Check HIV.  2. Atrial fibrillation: Persistent.  He has had cardioversions in the past.  Given MR/TR and LA dilation, maintenance of NSR will be  difficult.  I am going to give him 1 try to regain NSR using an antiarrhythmic.  I will start him on amiodarone 200 mg bid.  He will restart Xarelto after next week's cath and I will cardiovert him after 4 weeks of continuous Xarelto.  Will need to follow LFTs closely on Xarelto. Given Xarelto use, he can stop ASA.  3. HCV: ALT mildly elevated on most recent LFTs.   4. COPD: No longer smoking, uses Spiriva.  5. Colon cancer: Ongoing evaluation by oncology.   Loralie Champagne 10/17/2014

## 2014-10-24 LAB — POCT I-STAT 3, ART BLOOD GAS (G3+)
Acid-base deficit: 2 mmol/L (ref 0.0–2.0)
BICARBONATE: 22 meq/L (ref 20.0–24.0)
O2 Saturation: 98 %
PO2 ART: 105 mmHg — AB (ref 80.0–100.0)
TCO2: 23 mmol/L (ref 0–100)
pCO2 arterial: 34.2 mmHg — ABNORMAL LOW (ref 35.0–45.0)
pH, Arterial: 7.417 (ref 7.350–7.450)

## 2014-10-24 LAB — POCT I-STAT 3, VENOUS BLOOD GAS (G3P V)
ACID-BASE DEFICIT: 1 mmol/L (ref 0.0–2.0)
Bicarbonate: 25 mEq/L — ABNORMAL HIGH (ref 20.0–24.0)
O2 Saturation: 63 %
PH VEN: 7.354 — AB (ref 7.250–7.300)
TCO2: 26 mmol/L (ref 0–100)
pCO2, Ven: 44.8 mmHg — ABNORMAL LOW (ref 45.0–50.0)
pO2, Ven: 34 mmHg (ref 30.0–45.0)

## 2014-10-24 LAB — POCT ACTIVATED CLOTTING TIME: Activated Clotting Time: 159 seconds

## 2014-10-24 NOTE — Telephone Encounter (Signed)
We did receive records from [facility])  38 pages.  I will be sending them to medical records here to be scanned.

## 2014-10-25 ENCOUNTER — Telehealth: Payer: Self-pay | Admitting: Internal Medicine

## 2014-10-25 ENCOUNTER — Ambulatory Visit (HOSPITAL_BASED_OUTPATIENT_CLINIC_OR_DEPARTMENT_OTHER): Payer: Medicare Other | Admitting: Internal Medicine

## 2014-10-25 ENCOUNTER — Encounter: Payer: Self-pay | Admitting: Internal Medicine

## 2014-10-25 VITALS — BP 105/71 | HR 88 | Temp 98.8°F | Resp 18 | Ht 72.0 in | Wt 180.1 lb

## 2014-10-25 DIAGNOSIS — C189 Malignant neoplasm of colon, unspecified: Secondary | ICD-10-CM

## 2014-10-25 NOTE — Progress Notes (Signed)
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  CURRENT THERAPY: None  INTERVAL HISTORY: Paul Clayton 69 y.o. male returns to the clinic today for follow-up visit accompanied by family member. The patient was recently admitted to [facility] for treatment of congestive heart failure and unfortunately he missed his appointment with the general surgery for evaluation of his colon cancer. He also missed his appointment for the PET scan. The patient is feeling fine today with no specific complaints except for fatigue. He denied having any significant weight loss or night sweats. He denied having any chest pain, shortness breath, cough or hemoptysis. He is here today for previously scheduled visit for evaluation of his condition and discussion of his imaging results.  MEDICAL HISTORY: Past Medical History  Diagnosis Date  . A-fib   . Hypertension   . CHF (congestive heart failure)   . COPD (chronic obstructive pulmonary disease)   . Hepatitis C   . Tobacco abuse     ALLERGIES:  has No Known Allergies.  MEDICATIONS:  Current Outpatient Prescriptions  Medication Sig Dispense Refill  . albuterol-ipratropium (COMBIVENT) 18-103 MCG/ACT inhaler Inhale 1 puff into the lungs every 4 (four) hours as needed for wheezing or shortness of breath. 1 Inhaler 1  . amiodarone (PACERONE) 200 MG tablet Take 1 tablet (200 mg total) by mouth 2 (two) times daily. 60 tablet 3  . atorvastatin (LIPITOR) 20 MG tablet Take 1 tablet (20 mg total) by mouth daily. 30 tablet 0  . carvedilol (COREG) 6.25 MG tablet Take 3 tablets (18.75 mg total) by mouth 2 (two) times daily  with a meal. (Patient taking differently: Take 12.5 mg by mouth 2 (two) times daily with a meal. ) 180 tablet 2  . digoxin (LANOXIN) 0.25 MG tablet Take 1 tablet (0.25 mg total) by mouth daily. 30 tablet 0  . furosemide (LASIX) 20 MG tablet Take 40 mg by mouth daily.     Marland Kitchen lisinopril (PRINIVIL,ZESTRIL) 5 MG tablet Take 1 tablet (5 mg total) by mouth daily. 30 tablet 0  . mometasone-formoterol (DULERA) 200-5 MCG/ACT AERO Inhale 2 puffs into the lungs 2 (two) times daily. (Patient taking differently: Inhale 2 puffs into the lungs daily. ) 1 Inhaler 0  . rivaroxaban (XARELTO) 20 MG TABS tablet Take 1 tablet (20 mg total) by mouth daily with supper. 30 tablet 3  . spironolactone (ALDACTONE) 25 MG tablet Take 0.5 tablets (12.5 mg total) by mouth daily. 15 tablet 3  . tiotropium (SPIRIVA) 18 MCG inhalation capsule Place 18 mcg into inhaler and inhale daily.     No current facility-administered medications for this visit.    SURGICAL HISTORY:  Past Surgical History  Procedure Laterality Date  . Cardioversion    . Colonoscopy    . Left and right heart catheterization with coronary angiogram N/A 10/23/2014    Procedure: LEFT AND RIGHT HEART CATHETERIZATION WITH CORONARY ANGIOGRAM;  Surgeon: Larey Dresser, MD;  Location: [facility] CATH LAB;  Service: Cardiovascular;  Laterality: N/A;    REVIEW OF SYSTEMS:  Constitutional: negative Eyes: negative Ears, nose, mouth, throat, and face: negative Respiratory: negative Cardiovascular: negative Gastrointestinal: negative Genitourinary:negative Integument/breast: negative Hematologic/lymphatic: negative Musculoskeletal:negative  Neurological: negative Behavioral/Psych: negative Endocrine: negative Allergic/Immunologic: negative   PHYSICAL EXAMINATION: General appearance: alert, cooperative and no distress Head: Normocephalic, without obvious abnormality, atraumatic Neck: no adenopathy, no JVD, supple, symmetrical, trachea midline and thyroid not enlarged,  symmetric, no tenderness/mass/nodules Lymph nodes: Cervical, supraclavicular, and axillary nodes normal. Resp: clear to auscultation bilaterally Back: symmetric, no curvature. ROM normal. No CVA tenderness. Cardio: regular rate and rhythm, S1, S2 normal, no murmur, click, rub or gallop GI: soft, non-tender; bowel sounds normal; no masses,  no organomegaly Extremities: extremities normal, atraumatic, no cyanosis or edema Neurologic: Alert and oriented X 3, normal strength and tone. Normal symmetric reflexes. Normal coordination and gait  ECOG PERFORMANCE STATUS: 1 - Symptomatic but completely ambulatory  Blood pressure 105/71, pulse 88, temperature 98.8 F (37.1 C), temperature source Oral, resp. rate 18, height 6' (1.829 m), weight 180 lb 1.6 oz (81.693 kg).  LABORATORY DATA: Lab Results  Component Value Date   WBC 9.1 10/15/2014   HGB 13.3 10/15/2014   HCT 40.9 10/15/2014   MCV 91.1 10/15/2014   PLT 273 10/15/2014      Chemistry      Component Value Date/Time   NA 138 10/15/2014 0235   NA 139 10/11/2014 0825   K 4.3 10/15/2014 0235   K 3.9 10/11/2014 0825   CL 106 10/15/2014 0235   CO2 25 10/15/2014 0235   CO2 25 10/11/2014 0825   BUN 9 10/15/2014 0235   BUN 11.6 10/11/2014 0825   CREATININE 0.82 10/15/2014 0235   CREATININE 0.9 10/11/2014 0825      Component Value Date/Time   CALCIUM 8.9 10/15/2014 0235   CALCIUM 8.8 10/11/2014 0825   ALKPHOS 62 10/13/2014 0353   ALKPHOS 67 10/11/2014 0825   AST 33 10/13/2014 0353   AST 29 10/11/2014 0825   ALT 30 10/13/2014 0353   ALT 37 10/11/2014 0825   BILITOT 0.7 10/13/2014 0353   BILITOT 0.84 10/11/2014 0825       RADIOGRAPHIC STUDIES: Dg Chest 2 View  10/22/2014   CLINICAL DATA:  Status post fall today with shortness of breath and dizziness.  EXAM: CHEST  2 VIEW  COMPARISON:  October 13, 2014  FINDINGS: The heart size and mediastinal contours are stable. The heart size is enlarged. There is no focal infiltrate,  pulmonary edema, or consolidation. Chronic deformity of the right acromioclavicular joint is unchanged. The visualized skeletal structures are otherwise unremarkable.  IMPRESSION: No active cardiopulmonary disease.  Cardiomegaly.   Electronically Signed   By: Abelardo Diesel M.D.   On: 10/22/2014 13:11   Dg Chest 2 View  10/05/2014   CLINICAL DATA:  Short of breath  EXAM: CHEST  2 VIEW  COMPARISON:  09/27/2014  FINDINGS: COPD with hyperinflation.  Cardiac enlargement with vascular congestion and mild interstitial edema. Minimal pleural effusion. Minimal progression in edema since the prior study.  IMPRESSION: Congestive heart failure with slight progression of interstitial edema.   Electronically Signed   By: Franchot Gallo M.D.   On: 10/05/2014 10:03   Dg Chest 2 View  09/28/2014   CLINICAL DATA:  Dizziness short of breath.  EXAM: CHEST  2 VIEW  COMPARISON:  09/23/2014  FINDINGS: Cardiac enlargement with mild vascular congestion and developing interstitial changes consistent with early edema. No blunting of costophrenic angles. No pneumothorax. Emphysematous changes.  IMPRESSION: Cardiac enlargement with mild pulmonary vascular congestion and developing interstitial changes suggesting early interstitial edema.   Electronically Signed   By: Oren Beckmann.D.  On: 09/28/2014 00:25   Ct Head Wo Contrast  10/22/2014   CLINICAL DATA:  Altercation with fall and head trauma on this side walk several hr ago.  EXAM: CT HEAD WITHOUT CONTRAST  TECHNIQUE: Contiguous axial images were obtained from the base of the skull through the vertex without intravenous contrast.  COMPARISON:  10/13/2014  FINDINGS: No skull fracture. No fluid in the sinuses. The brain shows mild generalized atrophy without evidence of old or acute focal infarction, mass lesion, hemorrhage, hydrocephalus or extra-axial collection.  IMPRESSION: No acute or traumatic finding.  Mild brain atrophy.   Electronically Signed   By: Nelson Chimes M.D.    On: 10/22/2014 12:43   Ct Head Wo Contrast  10/13/2014   CLINICAL DATA:  Syncope. Fall hitting head. Patient reports anticoagulant use.  EXAM: CT HEAD WITHOUT CONTRAST  TECHNIQUE: Contiguous axial images were obtained from the base of the skull through the vertex without intravenous contrast.  COMPARISON:  None.  FINDINGS: No evidence for acute infarction, hemorrhage, mass lesion, hydrocephalus, or extra-axial fluid. Mild cerebral and cerebellar atrophy. Mild vascular calcification without CT signs of proximal thrombosis. Calvarium intact. Scalp soft tissues grossly unremarkable. No sinus or mastoid disease.  IMPRESSION: Chronic changes as described.  No acute intracranial abnormality.  No skull fracture or intracranial hemorrhage.   Electronically Signed   By: Rolla Flatten M.D.   On: 10/13/2014 07:59   Ct Chest W Contrast  10/10/2014   CLINICAL DATA:  Moderately differentiated 5 cm colonic adenocarcinoma found on colonoscopy at [facility] in [place], [place] in October 2015, nonobstructing mass 30 cm from the anal verge. The mass was resected along with other colonic polyps. This CT is for the patient's initial staging workup. Dyspnea with exertion.  EXAM: CT CHEST, ABDOMEN, AND PELVIS WITH CONTRAST  TECHNIQUE: Multidetector CT imaging of the chest, abdomen and pelvis was performed following the standard protocol during bolus administration of intravenous contrast.  CONTRAST:  11mL OMNIPAQUE IOHEXOL 300 MG/ML  SOLN  COMPARISON:  Chest radiograph of 10/05/2014  FINDINGS: CT CHEST FINDINGS  Indistinct right upper paratracheal lymph node 0.7 cm in short axis, image 10 series 2. AP window lymph node 0.8 cm in short axis, image 16 series 2. Calcified right hilar and left lower paratracheal lymph nodes compatible with remote granulomatous disease. Diffuse mild wall thickening in the esophagus.  Considerable cardiomegaly, particularly involving the left heart. Coronary atherosclerosis  involving the distal left main, left anterior descending, circumflex, and right coronary arteries.  There are patchy subsolid nodules and regions in the upper lobes, with a 3.2 by 2.2 cm ground-glass opacity in the right upper lobe on image 13 of series 4, a spiculated appearing 1.0 by 1.0 cm solid nodule in the right upper lobe on image 17 of series 4, and confluent indistinct airspace opacity medially in the left upper lobe on image 13 of series 4 in addition to multiple additional small sub solid nodules. Lingular subsegmental atelectasis medially.  Adjoining Schmorl' s nodes observed in the thoracic spine at the T7-8 level.  CT ABDOMEN AND PELVIS FINDINGS  Presumably due to reduced cardiac output, normal timing of the scan reduced an resulted an early arterial phase imaging of the abdomen and pelvis. We also obtained delayed phase images through the kidneys. The early arterial phase of contrast administration, prior to portal venous or delayed phase images of the liver, unfortunately result and reduced sensitivity for most liver lesion types.  Hepatobiliary: Several punctate calcifications favor remote  granulomatous disease. No worrisome hepatic enhancement, with the caveats that sensitivity is reduced due to the early phase of contrast timing. Gallbladder mildly contracted.  Pancreas: Unremarkable  Spleen: Unremarkable  Adrenals/Urinary Tract: Unremarkable  Stomach/Bowel: Appendix and bowel appear normal.  Vascular/Lymphatic: Aortoiliac atherosclerotic vascular disease. No discrete adenopathy identified.  Reproductive: Punctate calcification in the slightly enlarged prostate gland, otherwise negative.  Other: No supplemental non-categorized findings.  Musculoskeletal: Moderate osteoarthritis of both hips. Lumbar spondylosis and degenerative disc disease causing suspected impingement at all levels between L2 and S1.  IMPRESSION: 1. Scattered nodular densities in the upper lobes, primarily sub solid. Given the  patient's recent congestive heart failure and edema, some of the sub solid densities could be due to residual edema, in the sub solid appearance would be a highly atypical for colon cancer metastatic disease. However, the possibility of lung adenocarcinoma cannot be dismissed based on today's scan. One of the right upper lobe 1 cm nodules is solid and could be biopsied or PET-CT could be utilized to evaluate this lesion. PET-CT probably wall be helpful for the sub solid lesions, but chest CT in 3 months time will be helpful in assessing the sub solid lesions for stability. 2. No findings of metastatic disease in the liver or abdomen; please note that we do have reduced sensitivity due to the early arterial phase achieved with standard contrast timing. Standard timing was utilized, but I suspect the patient has dramatically reduced cardiac output, leading to delayed contrast transit. The considerable left heart enlargement would favor reduced cardiac output, correlate with ECG results. 3. Coronary atherosclerosis.   Electronically Signed   By: Sherryl Barters M.D.   On: 10/10/2014 13:47   Ct Abdomen Pelvis W Contrast  10/10/2014   CLINICAL DATA:  Moderately differentiated 5 cm colonic adenocarcinoma found on colonoscopy at [facility] in [place], [place] in October 2015, nonobstructing mass 30 cm from the anal verge. The mass was resected along with other colonic polyps. This CT is for the patient's initial staging workup. Dyspnea with exertion.  EXAM: CT CHEST, ABDOMEN, AND PELVIS WITH CONTRAST  TECHNIQUE: Multidetector CT imaging of the chest, abdomen and pelvis was performed following the standard protocol during bolus administration of intravenous contrast.  CONTRAST:  143mL OMNIPAQUE IOHEXOL 300 MG/ML  SOLN  COMPARISON:  Chest radiograph of 10/05/2014  FINDINGS: CT CHEST FINDINGS  Indistinct right upper paratracheal lymph node 0.7 cm in short axis, image 10 series 2. AP window lymph node  0.8 cm in short axis, image 16 series 2. Calcified right hilar and left lower paratracheal lymph nodes compatible with remote granulomatous disease. Diffuse mild wall thickening in the esophagus.  Considerable cardiomegaly, particularly involving the left heart. Coronary atherosclerosis involving the distal left main, left anterior descending, circumflex, and right coronary arteries.  There are patchy subsolid nodules and regions in the upper lobes, with a 3.2 by 2.2 cm ground-glass opacity in the right upper lobe on image 13 of series 4, a spiculated appearing 1.0 by 1.0 cm solid nodule in the right upper lobe on image 17 of series 4, and confluent indistinct airspace opacity medially in the left upper lobe on image 13 of series 4 in addition to multiple additional small sub solid nodules. Lingular subsegmental atelectasis medially.  Adjoining Schmorl' s nodes observed in the thoracic spine at the T7-8 level.  CT ABDOMEN AND PELVIS FINDINGS  Presumably due to reduced cardiac output, normal timing of the scan reduced an resulted an early arterial phase imaging  of the abdomen and pelvis. We also obtained delayed phase images through the kidneys. The early arterial phase of contrast administration, prior to portal venous or delayed phase images of the liver, unfortunately result and reduced sensitivity for most liver lesion types.  Hepatobiliary: Several punctate calcifications favor remote granulomatous disease. No worrisome hepatic enhancement, with the caveats that sensitivity is reduced due to the early phase of contrast timing. Gallbladder mildly contracted.  Pancreas: Unremarkable  Spleen: Unremarkable  Adrenals/Urinary Tract: Unremarkable  Stomach/Bowel: Appendix and bowel appear normal.  Vascular/Lymphatic: Aortoiliac atherosclerotic vascular disease. No discrete adenopathy identified.  Reproductive: Punctate calcification in the slightly enlarged prostate gland, otherwise negative.  Other: No supplemental  non-categorized findings.  Musculoskeletal: Moderate osteoarthritis of both hips. Lumbar spondylosis and degenerative disc disease causing suspected impingement at all levels between L2 and S1.  IMPRESSION: 1. Scattered nodular densities in the upper lobes, primarily sub solid. Given the patient's recent congestive heart failure and edema, some of the sub solid densities could be due to residual edema, in the sub solid appearance would be a highly atypical for colon cancer metastatic disease. However, the possibility of lung adenocarcinoma cannot be dismissed based on today's scan. One of the right upper lobe 1 cm nodules is solid and could be biopsied or PET-CT could be utilized to evaluate this lesion. PET-CT probably wall be helpful for the sub solid lesions, but chest CT in 3 months time will be helpful in assessing the sub solid lesions for stability. 2. No findings of metastatic disease in the liver or abdomen; please note that we do have reduced sensitivity due to the early arterial phase achieved with standard contrast timing. Standard timing was utilized, but I suspect the patient has dramatically reduced cardiac output, leading to delayed contrast transit. The considerable left heart enlargement would favor reduced cardiac output, correlate with ECG results. 3. Coronary atherosclerosis.   Electronically Signed   By: Sherryl Barters M.D.   On: 10/10/2014 13:47   Dg Chest Port 1 View  10/13/2014   CLINICAL DATA:  Acute onset of left-sided chest pain, shortness of breath and dizziness. Personal history of smoking. Initial encounter.  EXAM: PORTABLE CHEST - 1 VIEW  COMPARISON:  Chest radiograph performed 10/05/2014, and CT of the chest performed 10/10/2014  FINDINGS: The lungs are well-aerated. Recently noted subsolid nodular opacities on CT are not well characterized on radiograph. There is suggestion of mild interstitial prominence, which may reflect minimal residual interstitial edema. There is no  evidence of pleural effusion or pneumothorax.  The cardiomediastinal silhouette is borderline enlarged. No acute osseous abnormalities are seen.  IMPRESSION: Recently noted subsolid nodular opacities on CT are not well characterized on radiograph. Suggestion of mild interstitial prominence, which may reflect minimal residual interstitial edema, improved from the prior chest radiograph. Borderline cardiomegaly.   Electronically Signed   By: Garald Balding M.D.   On: 10/13/2014 04:21    ASSESSMENT AND PLAN: This is a very pleasant 69 years old African-American male with questionable early stage colon adenocarcinoma status post colon polyp resection in [facility]. The patient has no significant change since his last visit to have the patient missed his appointment for the PET scan as well as the appointment with Gen. Surgery. I will reschedule his appointment for the PET scan and is strongly advised the patient to contact [facility] to reschedule his appointment with Dr. Grandville Silos. He was seen by Gastroenterology but I don't see a follow-up appointment for him with this  group. I also advise him to contact his gastroenterologist for reevaluation. I will see the patient back for follow-up visit in one month for reevaluation and hopefully he will have his PET scan and surgical evaluation by that time. He was advised to call immediately if he has any concerning symptoms in the interval.  The patient voices understanding of current disease status and treatment options and is in agreement with the current care plan.  All questions were answered. The patient knows to call the clinic with any problems, questions or concerns. We can certainly see the patient much sooner if necessary.  Disclaimer: This note was dictated with voice recognition software. Similar sounding words can inadvertently be transcribed and may not be corrected upon review.

## 2014-10-25 NOTE — Telephone Encounter (Signed)
Pt confirmed labs/ov per 01/06 POF, gave pt AVS.... KJ °

## 2014-10-27 ENCOUNTER — Other Ambulatory Visit: Payer: Self-pay

## 2014-10-27 ENCOUNTER — Encounter: Payer: Self-pay | Admitting: Physician Assistant

## 2014-10-27 ENCOUNTER — Inpatient Hospital Stay (HOSPITAL_COMMUNITY)
Admission: EM | Admit: 2014-10-27 | Discharge: 2014-10-28 | DRG: 191 | Disposition: A | Discharge status: Home / Self Care | Payer: Medicare Other | Attending: Family Medicine | Admitting: Family Medicine

## 2014-10-27 ENCOUNTER — Emergency Department (HOSPITAL_COMMUNITY): Payer: Medicare Other

## 2014-10-27 ENCOUNTER — Inpatient Hospital Stay: Payer: Medicare Other | Admitting: Family Medicine

## 2014-10-27 ENCOUNTER — Encounter (HOSPITAL_COMMUNITY): Payer: Self-pay

## 2014-10-27 DIAGNOSIS — I509 Heart failure, unspecified: Secondary | ICD-10-CM | POA: Diagnosis not present

## 2014-10-27 DIAGNOSIS — Z590 Homelessness: Secondary | ICD-10-CM | POA: Diagnosis not present

## 2014-10-27 DIAGNOSIS — I4819 Other persistent atrial fibrillation: Secondary | ICD-10-CM | POA: Insufficient documentation

## 2014-10-27 DIAGNOSIS — B1920 Unspecified viral hepatitis C without hepatic coma: Secondary | ICD-10-CM | POA: Diagnosis present

## 2014-10-27 DIAGNOSIS — I10 Essential (primary) hypertension: Secondary | ICD-10-CM | POA: Diagnosis present

## 2014-10-27 DIAGNOSIS — I4891 Unspecified atrial fibrillation: Secondary | ICD-10-CM | POA: Diagnosis not present

## 2014-10-27 DIAGNOSIS — F1410 Cocaine abuse, uncomplicated: Secondary | ICD-10-CM | POA: Diagnosis present

## 2014-10-27 DIAGNOSIS — R0602 Shortness of breath: Secondary | ICD-10-CM | POA: Diagnosis not present

## 2014-10-27 DIAGNOSIS — R55 Syncope and collapse: Secondary | ICD-10-CM | POA: Diagnosis not present

## 2014-10-27 DIAGNOSIS — F1010 Alcohol abuse, uncomplicated: Secondary | ICD-10-CM | POA: Diagnosis present

## 2014-10-27 DIAGNOSIS — I429 Cardiomyopathy, unspecified: Secondary | ICD-10-CM | POA: Diagnosis not present

## 2014-10-27 DIAGNOSIS — J441 Chronic obstructive pulmonary disease with (acute) exacerbation: Secondary | ICD-10-CM | POA: Diagnosis not present

## 2014-10-27 DIAGNOSIS — I5022 Chronic systolic (congestive) heart failure: Secondary | ICD-10-CM | POA: Diagnosis not present

## 2014-10-27 DIAGNOSIS — I481 Persistent atrial fibrillation: Secondary | ICD-10-CM | POA: Diagnosis present

## 2014-10-27 DIAGNOSIS — I2510 Atherosclerotic heart disease of native coronary artery without angina pectoris: Secondary | ICD-10-CM | POA: Diagnosis present

## 2014-10-27 DIAGNOSIS — Z87891 Personal history of nicotine dependence: Secondary | ICD-10-CM

## 2014-10-27 HISTORY — DX: Malignant neoplasm of colon, unspecified: C18.9

## 2014-10-27 LAB — CBC WITH DIFFERENTIAL/PLATELET
Basophils Absolute: 0 10*3/uL (ref 0.0–0.1)
Basophils Relative: 0 % (ref 0–1)
EOS ABS: 0.1 10*3/uL (ref 0.0–0.7)
Eosinophils Relative: 1 % (ref 0–5)
HCT: 38.4 % — ABNORMAL LOW (ref 39.0–52.0)
Hemoglobin: 12.8 g/dL — ABNORMAL LOW (ref 13.0–17.0)
Lymphocytes Relative: 17 % (ref 12–46)
Lymphs Abs: 1.4 10*3/uL (ref 0.7–4.0)
MCH: 29.6 pg (ref 26.0–34.0)
MCHC: 33.3 g/dL (ref 30.0–36.0)
MCV: 88.9 fL (ref 78.0–100.0)
MONO ABS: 1.2 10*3/uL — AB (ref 0.1–1.0)
Monocytes Relative: 14 % — ABNORMAL HIGH (ref 3–12)
NEUTROS PCT: 68 % (ref 43–77)
Neutro Abs: 5.6 10*3/uL (ref 1.7–7.7)
PLATELETS: 206 10*3/uL (ref 150–400)
RBC: 4.32 MIL/uL (ref 4.22–5.81)
RDW: 13.6 % (ref 11.5–15.5)
WBC: 8.3 10*3/uL (ref 4.0–10.5)

## 2014-10-27 LAB — COMPREHENSIVE METABOLIC PANEL
ALT: 29 U/L (ref 0–53)
AST: 33 U/L (ref 0–37)
Albumin: 3.1 g/dL — ABNORMAL LOW (ref 3.5–5.2)
Alkaline Phosphatase: 54 U/L (ref 39–117)
Anion gap: 7 (ref 5–15)
BUN: 12 mg/dL (ref 6–23)
CALCIUM: 8.6 mg/dL (ref 8.4–10.5)
CO2: 22 mmol/L (ref 19–32)
Chloride: 106 mEq/L (ref 96–112)
Creatinine, Ser: 0.94 mg/dL (ref 0.50–1.35)
GFR calc Af Amer: >90 mL/min (ref >90)
GFR calc non Af Amer: 85 mL/min — ABNORMAL LOW (ref >90)
Glucose, Bld: 119 mg/dL — ABNORMAL HIGH (ref 70–99)
Potassium: 4.4 mmol/L (ref 3.5–5.1)
SODIUM: 135 mmol/L (ref 135–145)
TOTAL PROTEIN: 6.7 g/dL (ref 6.0–8.3)
Total Bilirubin: 1.2 mg/dL (ref 0.3–1.2)

## 2014-10-27 LAB — DIGOXIN LEVEL: Digoxin Level: 0.9 ng/mL (ref 0.8–2.0)

## 2014-10-27 LAB — TROPONIN I
TROPONIN I: 0.05 ng/mL — AB (ref <0.031)
TROPONIN I: 0.03 ng/mL (ref <0.031)
Troponin I: 0.04 ng/mL — ABNORMAL HIGH (ref <0.031)
Troponin I: 0.03 ng/mL (ref <0.031)

## 2014-10-27 LAB — BRAIN NATRIURETIC PEPTIDE: B NATRIURETIC PEPTIDE 5: 607.9 pg/mL — AB (ref 0.0–100.0)

## 2014-10-27 LAB — D-DIMER, QUANTITATIVE: D-Dimer, Quant: 0.41 ug/mL-FEU (ref 0.00–0.48)

## 2014-10-27 MED ORDER — DOXYCYCLINE HYCLATE 100 MG PO TABS
100.0000 mg | ORAL_TABLET | Freq: Two times a day (BID) | ORAL | Status: DC
  Administered 2014-10-27 – 2014-10-28 (×3): 100 mg via ORAL
  Filled 2014-10-27 (×4): qty 1

## 2014-10-27 MED ORDER — PNEUMOCOCCAL VAC POLYVALENT 25 MCG/0.5ML IJ INJ
0.5000 mL | INJECTION | INTRAMUSCULAR | Status: AC
  Administered 2014-10-28: 0.5 mL via INTRAMUSCULAR
  Filled 2014-10-27: qty 0.5

## 2014-10-27 MED ORDER — LORAZEPAM 1 MG PO TABS: 1.0000 mg | ORAL_TABLET | Freq: Four times a day (QID) | ORAL | Status: DC | PRN

## 2014-10-27 MED ORDER — LORAZEPAM 2 MG/ML IJ SOLN: 1.0000 mg | Freq: Four times a day (QID) | INTRAMUSCULAR | Status: DC | PRN

## 2014-10-27 MED ORDER — TIOTROPIUM BROMIDE MONOHYDRATE 18 MCG IN CAPS
18.0000 ug | ORAL_CAPSULE | Freq: Every day | RESPIRATORY_TRACT | Status: DC
  Administered 2014-10-27 – 2014-10-28 (×2): 18 ug via RESPIRATORY_TRACT
  Filled 2014-10-27: qty 5

## 2014-10-27 MED ORDER — PREDNISONE 20 MG PO TABS
40.0000 mg | ORAL_TABLET | Freq: Every day | ORAL | Status: DC
Start: 2014-10-28 — End: 2014-10-28
  Administered 2014-10-28: 40 mg via ORAL
  Filled 2014-10-27 (×3): qty 2

## 2014-10-27 MED ORDER — MOMETASONE FURO-FORMOTEROL FUM 200-5 MCG/ACT IN AERO
2.0000 | INHALATION_SPRAY | Freq: Two times a day (BID) | RESPIRATORY_TRACT | Status: DC
  Administered 2014-10-27 – 2014-10-28 (×3): 2 via RESPIRATORY_TRACT
  Filled 2014-10-27: qty 8.8

## 2014-10-27 MED ORDER — SPIRONOLACTONE 12.5 MG HALF TABLET
12.5000 mg | ORAL_TABLET | Freq: Every day | ORAL | Status: DC
  Administered 2014-10-27 – 2014-10-28 (×2): 12.5 mg via ORAL
  Filled 2014-10-27 (×2): qty 1

## 2014-10-27 MED ORDER — RIVAROXABAN 20 MG PO TABS
20.0000 mg | ORAL_TABLET | Freq: Every day | ORAL | Status: DC
  Administered 2014-10-27: 20 mg via ORAL
  Filled 2014-10-27 (×2): qty 1

## 2014-10-27 MED ORDER — ALBUTEROL SULFATE (2.5 MG/3ML) 0.083% IN NEBU
5.0000 mg | INHALATION_SOLUTION | Freq: Once | RESPIRATORY_TRACT | Status: AC
  Administered 2014-10-27: 5 mg via RESPIRATORY_TRACT
  Filled 2014-10-27: qty 6

## 2014-10-27 MED ORDER — METHYLPREDNISOLONE SODIUM SUCC 125 MG IJ SOLR
125.0000 mg | Freq: Once | INTRAMUSCULAR | Status: AC
  Administered 2014-10-27: 125 mg via INTRAVENOUS
  Filled 2014-10-27: qty 2

## 2014-10-27 MED ORDER — FUROSEMIDE 10 MG/ML IJ SOLN
40.0000 mg | Freq: Two times a day (BID) | INTRAMUSCULAR | Status: DC
  Administered 2014-10-27: 40 mg via INTRAVENOUS
  Filled 2014-10-27: qty 4

## 2014-10-27 MED ORDER — FUROSEMIDE 40 MG PO TABS
40.0000 mg | ORAL_TABLET | Freq: Every day | ORAL | Status: DC
  Administered 2014-10-28: 40 mg via ORAL
  Filled 2014-10-27: qty 1

## 2014-10-27 MED ORDER — CARVEDILOL 12.5 MG PO TABS
12.5000 mg | ORAL_TABLET | Freq: Two times a day (BID) | ORAL | Status: DC
  Administered 2014-10-27 – 2014-10-28 (×3): 12.5 mg via ORAL
  Filled 2014-10-27 (×6): qty 1

## 2014-10-27 MED ORDER — IPRATROPIUM-ALBUTEROL 0.5-2.5 (3) MG/3ML IN SOLN: 3.0000 mL | RESPIRATORY_TRACT | Status: DC | PRN

## 2014-10-27 MED ORDER — AMIODARONE HCL 200 MG PO TABS
200.0000 mg | ORAL_TABLET | Freq: Two times a day (BID) | ORAL | Status: DC
  Administered 2014-10-27 – 2014-10-28 (×3): 200 mg via ORAL
  Filled 2014-10-27 (×4): qty 1

## 2014-10-27 NOTE — Consult Note (Signed)
CARDIOLOGY CONSULT NOTE   Patient ID: Paul Clayton MRN: [id number], DOB/AGE: [birth year]   Admit date: 10/27/2014 Date of Consult: 10/27/2014   Primary Physician: [name], MD Primary Cardiologist: Dr. Ron Parker Primary Heart Failure: Dr. Aundra Dubin  Pt. Profile  69 year old [place] American male with history of COPD, EtOH/cocaine abuse, colon cancer, persistent atrial fibrillation, and chronic systolic heart failure present with recurrent syncope  Problem List  Past Medical History  Diagnosis Date  . A-fib   . Hypertension   . CHF (congestive heart failure)   . COPD (chronic obstructive pulmonary disease)   . Hepatitis C   . Tobacco abuse   . Adenocarcinoma of colon     STAGE 1  . Dysrhythmia     hx of atrial fibrilation    Past Surgical History  Procedure Laterality Date  . Cardioversion    . Colonoscopy    . Left and right heart catheterization with coronary angiogram N/A 10/23/2014    Procedure: LEFT AND RIGHT HEART CATHETERIZATION WITH CORONARY ANGIOGRAM;  Surgeon: Larey Dresser, MD;  Location: [facility] C[facility];  Service: Cardiovascular;  Laterality: N/A;     Allergies  No Known Allergies  HPI   The patient is a 69 year old African American male with history of COPD, EtOH/cocaine abuse, colon cancer, persistent atrial fibrillation, and chronic systolic heart failure. He moved to [place] from [place] in the later half of 2015 to be with his daughter. Since Nov, he has had multiple admissions at [facility] for heart failure, syncope and atrial fibrillation. Per previous record, patient has no cardiomyopathy dating back to 2012. He has stage III-IV heart failure symptom. He also has been in permanent atrial fibrillation with recently failed cardioversion a few months ago in [place]. According to the patient, he has been clean from EtOH and cocaine since October. He does admit he occasionally smokes a few puff of cigarettes, however never finished the entire  cigarettes. He was admitted on November 28 prior to his scheduled consultation at advanced heart failure clinic for COPD exacerbation, acute on chronic systolic heart failure, syncopal episode and atrial fibrillation. Cardiology was consulted, biggest concern preventing aggressive intervention was his unkown abdominal mass and polysubstance abuse history. Records indicate adenocarcinoma of colon per large polyp resection and biopsy. CT of the chest obtained in November also showed 3 mm right upper lobe pulmonary nodule. He was discharged on Coreg, digoxin, lisinopril. He is echocardiogram in November showed EF 15%, diffuse hypokinesis, severe MR/TR, moderate to severely dilated LA. It was felt he was not a candidate for device unless he can demonstrate compliance with follow-up and avoiding cocaine, and also further evaluation for colon cancer. Upon discharge, he was restarted on Xarelto. He is syncopal episode was felt to be or orthostatic hypotension. Patient had a short overnight admission for COPD exacerbation in early December. He returned on December 25 with atrial fibrillation with RVR after running out of his Coreg for 2 weeks. He was diuresed and rate controlled. He was seen by Dr. Aundra Dubin in the clinic on 10/17/2014 at which time he denies any further chest pain, syncope or lightheadedness. Spironolactone 12.5 mg was added. Given the fact patient never had a cardiac catheterization and the EF of 15%, left and right heart cath was scheduled. Patient was instructed to hold Xarelto perioperatively. As for his atrial fibrillation, he was started on amiodarone 200 mg twice a day with plan for DC cardioversion after 4 weeks of continuous Xarelto.  Patient underwent elective  cardiac catheterization on 10/23/2014 which showed a mean wedge pressure 21, CO 4.31, CI 2.1, 20-30% diffuse nonobstructive CAD, EF 20-25% with diffuse hypokinesis. His cardiomyopathy was deemed nonischemic cardiomyopathy, with mildly elevated  filling pressure, his home Lasix was changed to 40 mg daily. Since his discharge, patient has been having some significant dizziness ever since he started on amiodarone. He also described weakness in the chronic shortness breath with minimal exertion. He states he has been compliant with his diuretic medication and has not noted any recent lower extremity edema. He does endorse chronic orthopnea and recent abdominal distention, however states his shortness breath has been getting better rate in the recent weeks. Patient was using the bathroom and straining on the toilet when he passed out. He was subsequently transferred to [facility] for further evaluation. On arrival to [facility], significant laboratory finding include sodium 134, potassium 4.4, troponin mildly elevated at 0.05, BNP 607, hemoglobin 12.8. Digoxin level appears to be within normal limits. Chest x-ray showed mild cardiomegaly, no acute pulmonary process. EKG showed atrial fibrillation with heart rate in the 110s. He was admitted family medicine service and cardiology has been consulted for atrial fibrillation for rate control and management of diuretic.  Of note, during his December follow-up with Dr. Aundra Dubin, patient was 187 pounds, he is currently 181 pounds.  Inpatient Medications  . amiodarone  200 mg Oral BID  . carvedilol  12.5 mg Oral BID WC  . doxycycline  100 mg Oral Q12H  . furosemide  40 mg Intravenous BID  . mometasone-formoterol  2 puff Inhalation BID  . [START ON 10/28/2014] pneumococcal 23 valent vaccine  0.5 mL Intramuscular Tomorrow-1000  . [START ON 10/28/2014] predniSONE  40 mg Oral Q breakfast  . rivaroxaban  20 mg Oral Q supper  . spironolactone  12.5 mg Oral Daily  . tiotropium  18 mcg Inhalation Daily    Family History Family History  Problem Relation Age of Onset  . Hypertension Mother      Social History History   Social History  . Marital Status: Widowed    Spouse Name: N/A     Number of Children: N/A  . Years of Education: N/A   Occupational History  . Not on file.   Social History Main Topics  . Smoking status: Former Smoker    Quit date: 09/01/2014  . Smokeless tobacco: Never Used  . Alcohol Use: 0.0 oz/week    0 Not specified per week     Comment: " i quit drinking alcohol "  . Drug Use: Yes    Special: Cocaine     Comment: hx of substance of abuse;denies use x 1 month  . Sexual Activity: Not Currently    Birth Control/ Protection: Abstinence   Other Topics Concern  . Not on file   Social History Narrative     Review of Systems  General:  No chills, fever, night sweats or weight changes.  Cardiovascular:  No chest pain, edema, palpitations. + Chronic orthopnea, chronic exertional dyspnea,  paroxysmal nocturnal dyspnea Dermatological: No rash, lesions/masses Respiratory: No dyspnea +persistent cough for several month Urologic: No hematuria, dysuria Abdominal:   No nausea, vomiting, diarrhea, bright red blood per rectum, melena, or hematemesis Neurologic:  No visual changes. +weakness and syncope All other systems reviewed and are otherwise negative except as noted above.  Physical Exam  Blood pressure 114/78, pulse 102, temperature 98 F (36.7 C), temperature source Oral, resp. rate 22, height 6' (1.829 m),  weight 181 lb 7 oz (82.3 kg), SpO2 99 %.  General: Pleasant, NAD Psych: Normal affect. Neuro: Alert and oriented X 3. Moves all extremities spontaneously. HEENT: Normal  Neck: Supple without bruits or JVD. Lungs:  Resp regular and unlabored, CTA. Heart: irregular, mildly tachycardic no s3, s4, or murmurs. Abdomen: Soft, non-tender, non-distended, BS + x 4.  Extremities: No clubbing, cyanosis or edema. DP/PT/Radials 2+ and equal bilaterally. LE cool, but not cold, does not appear to be wet  Labs   Recent Labs  10/27/14 0335 10/27/14 0912  TROPONINI 0.05* 0.04*   Lab Results  Component Value Date   WBC 8.3 10/27/2014   HGB  12.8* 10/27/2014   HCT 38.4* 10/27/2014   MCV 88.9 10/27/2014   PLT 206 10/27/2014    Recent Labs Lab 10/27/14 0335  NA 135  K 4.4  CL 106  CO2 22  BUN 12  CREATININE 0.94  CALCIUM 8.6  PROT 6.7  BILITOT 1.2  ALKPHOS 54  ALT 29  AST 33  GLUCOSE 119*   Lab Results  Component Value Date   CHOL 137 09/16/2014   HDL 43 09/16/2014   LDLCALC 73 09/16/2014   TRIG 103 09/16/2014   Lab Results  Component Value Date   DDIMER 0.41 10/27/2014    Radiology/Studies  Dg Chest 2 View  10/22/2014   CLINICAL DATA:  Status post fall today with shortness of breath and dizziness.  EXAM: CHEST  2 VIEW  COMPARISON:  October 13, 2014  FINDINGS: The heart size and mediastinal contours are stable. The heart size is enlarged. There is no focal infiltrate, pulmonary edema, or consolidation. Chronic deformity of the right acromioclavicular joint is unchanged. The visualized skeletal structures are otherwise unremarkable.  IMPRESSION: No active cardiopulmonary disease.  Cardiomegaly.   Electronically Signed   By: Abelardo Diesel M.D.   On: 10/22/2014 13:11   Dg Chest 2 View  10/05/2014   CLINICAL DATA:  Short of breath  EXAM: CHEST  2 VIEW  COMPARISON:  09/27/2014  FINDINGS: COPD with hyperinflation.  Cardiac enlargement with vascular congestion and mild interstitial edema. Minimal pleural effusion. Minimal progression in edema since the prior study.  IMPRESSION: Congestive heart failure with slight progression of interstitial edema.   Electronically Signed   By: Franchot Gallo M.D.   On: 10/05/2014 10:03   Dg Chest 2 View  09/28/2014   CLINICAL DATA:  Dizziness short of breath.  EXAM: CHEST  2 VIEW  COMPARISON:  09/23/2014  FINDINGS: Cardiac enlargement with mild vascular congestion and developing interstitial changes consistent with early edema. No blunting of costophrenic angles. No pneumothorax. Emphysematous changes.  IMPRESSION: Cardiac enlargement with mild pulmonary vascular congestion and  developing interstitial changes suggesting early interstitial edema.   Electronically Signed   By: Lucienne Capers M.D.   On: 09/28/2014 00:25   Ct Head Wo Contrast  10/22/2014   CLINICAL DATA:  Altercation with fall and head trauma on this side walk several hr ago.  EXAM: CT HEAD WITHOUT CONTRAST  TECHNIQUE: Contiguous axial images were obtained from the base of the skull through the vertex without intravenous contrast.  COMPARISON:  10/13/2014  FINDINGS: No skull fracture. No fluid in the sinuses. The brain shows mild generalized atrophy without evidence of old or acute focal infarction, mass lesion, hemorrhage, hydrocephalus or extra-axial collection.  IMPRESSION: No acute or traumatic finding.  Mild brain atrophy.   Electronically Signed   By: Nelson Chimes M.D.   On: 10/22/2014 12:43  Ct Head Wo Contrast  10/13/2014   CLINICAL DATA:  Syncope. Fall hitting head. Patient reports anticoagulant use.  EXAM: CT HEAD WITHOUT CONTRAST  TECHNIQUE: Contiguous axial images were obtained from the base of the skull through the vertex without intravenous contrast.  COMPARISON:  None.  FINDINGS: No evidence for acute infarction, hemorrhage, mass lesion, hydrocephalus, or extra-axial fluid. Mild cerebral and cerebellar atrophy. Mild vascular calcification without CT signs of proximal thrombosis. Calvarium intact. Scalp soft tissues grossly unremarkable. No sinus or mastoid disease.  IMPRESSION: Chronic changes as described.  No acute intracranial abnormality.  No skull fracture or intracranial hemorrhage.   Electronically Signed   By: Rolla Flatten M.D.   On: 10/13/2014 07:59   Ct Chest W Contrast  10/10/2014   CLINICAL DATA:  Moderately differentiated 5 cm colonic adenocarcinoma found on colonoscopy at [facility] in [place], [place] in October 2015, nonobstructing mass 30 cm from the anal verge. The mass was resected along with other colonic polyps. This CT is for the patient's initial staging  workup. Dyspnea with exertion.  EXAM: CT CHEST, ABDOMEN, AND PELVIS WITH CONTRAST  TECHNIQUE: Multidetector CT imaging of the chest, abdomen and pelvis was performed following the standard protocol during bolus administration of intravenous contrast.  CONTRAST:  12mL OMNIPAQUE IOHEXOL 300 MG/ML  SOLN  COMPARISON:  Chest radiograph of 10/05/2014  FINDINGS: CT CHEST FINDINGS  Indistinct right upper paratracheal lymph node 0.7 cm in short axis, image 10 series 2. AP window lymph node 0.8 cm in short axis, image 16 series 2. Calcified right hilar and left lower paratracheal lymph nodes compatible with remote granulomatous disease. Diffuse mild wall thickening in the esophagus.  Considerable cardiomegaly, particularly involving the left heart. Coronary atherosclerosis involving the distal left main, left anterior descending, circumflex, and right coronary arteries.  There are patchy subsolid nodules and regions in the upper lobes, with a 3.2 by 2.2 cm ground-glass opacity in the right upper lobe on image 13 of series 4, a spiculated appearing 1.0 by 1.0 cm solid nodule in the right upper lobe on image 17 of series 4, and confluent indistinct airspace opacity medially in the left upper lobe on image 13 of series 4 in addition to multiple additional small sub solid nodules. Lingular subsegmental atelectasis medially.  Adjoining Schmorl' s nodes observed in the thoracic spine at the T7-8 level.  CT ABDOMEN AND PELVIS FINDINGS  Presumably due to reduced cardiac output, normal timing of the scan reduced an resulted an early arterial phase imaging of the abdomen and pelvis. We also obtained delayed phase images through the kidneys. The early arterial phase of contrast administration, prior to portal venous or delayed phase images of the liver, unfortunately result and reduced sensitivity for most liver lesion types.  Hepatobiliary: Several punctate calcifications favor remote granulomatous disease. No worrisome hepatic  enhancement, with the caveats that sensitivity is reduced due to the early phase of contrast timing. Gallbladder mildly contracted.  Pancreas: Unremarkable  Spleen: Unremarkable  Adrenals/Urinary Tract: Unremarkable  Stomach/Bowel: Appendix and bowel appear normal.  Vascular/Lymphatic: Aortoiliac atherosclerotic vascular disease. No discrete adenopathy identified.  Reproductive: Punctate calcification in the slightly enlarged prostate gland, otherwise negative.  Other: No supplemental non-categorized findings.  Musculoskeletal: Moderate osteoarthritis of both hips. Lumbar spondylosis and degenerative disc disease causing suspected impingement at all levels between L2 and S1.  IMPRESSION: 1. Scattered nodular densities in the upper lobes, primarily sub solid. Given the patient's recent congestive heart failure and edema, some of the sub  solid densities could be due to residual edema, in the sub solid appearance would be a highly atypical for colon cancer metastatic disease. However, the possibility of lung adenocarcinoma cannot be dismissed based on today's scan. One of the right upper lobe 1 cm nodules is solid and could be biopsied or PET-CT could be utilized to evaluate this lesion. PET-CT probably wall be helpful for the sub solid lesions, but chest CT in 3 months time will be helpful in assessing the sub solid lesions for stability. 2. No findings of metastatic disease in the liver or abdomen; please note that we do have reduced sensitivity due to the early arterial phase achieved with standard contrast timing. Standard timing was utilized, but I suspect the patient has dramatically reduced cardiac output, leading to delayed contrast transit. The considerable left heart enlargement would favor reduced cardiac output, correlate with ECG results. 3. Coronary atherosclerosis.   Electronically Signed   By: Sherryl Barters M.D.   On: 10/10/2014 13:47   Ct Abdomen Pelvis W Contrast  10/10/2014   CLINICAL DATA:   Moderately differentiated 5 cm colonic adenocarcinoma found on colonoscopy at [facility] in [place], [place] in October 2015, nonobstructing mass 30 cm from the anal verge. The mass was resected along with other colonic polyps. This CT is for the patient's initial staging workup. Dyspnea with exertion.  EXAM: CT CHEST, ABDOMEN, AND PELVIS WITH CONTRAST  TECHNIQUE: Multidetector CT imaging of the chest, abdomen and pelvis was performed following the standard protocol during bolus administration of intravenous contrast.  CONTRAST:  149mL OMNIPAQUE IOHEXOL 300 MG/ML  SOLN  COMPARISON:  Chest radiograph of 10/05/2014  FINDINGS: CT CHEST FINDINGS  Indistinct right upper paratracheal lymph node 0.7 cm in short axis, image 10 series 2. AP window lymph node 0.8 cm in short axis, image 16 series 2. Calcified right hilar and left lower paratracheal lymph nodes compatible with remote granulomatous disease. Diffuse mild wall thickening in the esophagus.  Considerable cardiomegaly, particularly involving the left heart. Coronary atherosclerosis involving the distal left main, left anterior descending, circumflex, and right coronary arteries.  There are patchy subsolid nodules and regions in the upper lobes, with a 3.2 by 2.2 cm ground-glass opacity in the right upper lobe on image 13 of series 4, a spiculated appearing 1.0 by 1.0 cm solid nodule in the right upper lobe on image 17 of series 4, and confluent indistinct airspace opacity medially in the left upper lobe on image 13 of series 4 in addition to multiple additional small sub solid nodules. Lingular subsegmental atelectasis medially.  Adjoining Schmorl' s nodes observed in the thoracic spine at the T7-8 level.  CT ABDOMEN AND PELVIS FINDINGS  Presumably due to reduced cardiac output, normal timing of the scan reduced an resulted an early arterial phase imaging of the abdomen and pelvis. We also obtained delayed phase images through the kidneys. The  early arterial phase of contrast administration, prior to portal venous or delayed phase images of the liver, unfortunately result and reduced sensitivity for most liver lesion types.  Hepatobiliary: Several punctate calcifications favor remote granulomatous disease. No worrisome hepatic enhancement, with the caveats that sensitivity is reduced due to the early phase of contrast timing. Gallbladder mildly contracted.  Pancreas: Unremarkable  Spleen: Unremarkable  Adrenals/Urinary Tract: Unremarkable  Stomach/Bowel: Appendix and bowel appear normal.  Vascular/Lymphatic: Aortoiliac atherosclerotic vascular disease. No discrete adenopathy identified.  Reproductive: Punctate calcification in the slightly enlarged prostate gland, otherwise negative.  Other: No supplemental non-categorized findings.  Musculoskeletal: Moderate osteoarthritis of both hips. Lumbar spondylosis and degenerative disc disease causing suspected impingement at all levels between L2 and S1.  IMPRESSION: 1. Scattered nodular densities in the upper lobes, primarily sub solid. Given the patient's recent congestive heart failure and edema, some of the sub solid densities could be due to residual edema, in the sub solid appearance would be a highly atypical for colon cancer metastatic disease. However, the possibility of lung adenocarcinoma cannot be dismissed based on today's scan. One of the right upper lobe 1 cm nodules is solid and could be biopsied or PET-CT could be utilized to evaluate this lesion. PET-CT probably wall be helpful for the sub solid lesions, but chest CT in 3 months time will be helpful in assessing the sub solid lesions for stability. 2. No findings of metastatic disease in the liver or abdomen; please note that we do have reduced sensitivity due to the early arterial phase achieved with standard contrast timing. Standard timing was utilized, but I suspect the patient has dramatically reduced cardiac output, leading to delayed  contrast transit. The considerable left heart enlargement would favor reduced cardiac output, correlate with ECG results. 3. Coronary atherosclerosis.   Electronically Signed   By: Sherryl Barters M.D.   On: 10/10/2014 13:47   Dg Chest Port 1 View  10/27/2014   CLINICAL DATA:  Shortness of breath, history of COPD and CHF.  EXAM: PORTABLE CHEST - 1 VIEW  COMPARISON:  Chest radiograph October 22, 2014  FINDINGS: The cardiac silhouette is mildly enlarged, unchanged. Mediastinal silhouette is nonsuspicious. No pleural effusions or focal consolidations. No pneumothorax. Soft tissue planes and included osseous structures are nonsuspicious.  IMPRESSION: Stable mild cardiomegaly, no acute pulmonary process.   Electronically Signed   By: Elon Alas   On: 10/27/2014 03:55   Dg Chest Port 1 View  10/13/2014   CLINICAL DATA:  Acute onset of left-sided chest pain, shortness of breath and dizziness. Personal history of smoking. Initial encounter.  EXAM: PORTABLE CHEST - 1 VIEW  COMPARISON:  Chest radiograph performed 10/05/2014, and CT of the chest performed 10/10/2014  FINDINGS: The lungs are well-aerated. Recently noted subsolid nodular opacities on CT are not well characterized on radiograph. There is suggestion of mild interstitial prominence, which may reflect minimal residual interstitial edema. There is no evidence of pleural effusion or pneumothorax.  The cardiomediastinal silhouette is borderline enlarged. No acute osseous abnormalities are seen.  IMPRESSION: Recently noted subsolid nodular opacities on CT are not well characterized on radiograph. Suggestion of mild interstitial prominence, which may reflect minimal residual interstitial edema, improved from the prior chest radiograph. Borderline cardiomegaly.   Electronically Signed   By: Garald Balding M.D.   On: 10/13/2014 04:21   L&R Cath 10/23/2014 Hemodynamics (mmHg) RA mean 9 RV 44/9 PA 44/26, mean 35 PCWP mean 21 LV 107/22 AO 108/82  Oxygen  saturations: PA 63% AO 98%  Cardiac Output (Fick) 4.31  Cardiac Index (Fick) 2.1 PVR 3.2 WU  Coronary angiography: Coronary dominance: right  Left mainstem: 20% distal tapering.   Left anterior descending (LAD): 30% ostial LAD stenosis. Large high D1. Luminal irregularities.   Left circumflex (LCx): Small vessel with 30% ostial stenosis. Luminal irregularities.   Right coronary artery (RCA): 30% proximal RCA stenosis, luminal irregularities.  Left ventriculography: EF 20-25%, diffuse hypokinesis.  ECG  A-fib, with borderline HR  ASSESSMENT AND PLAN  1. Syncope  - previously thought to be related to orthostatic and low BP  - discussed  with Dr. Haroldine Laws, will do lifevest  2. Chronic systolic HF  - recently had lasix increased from 20mg  to 40mg  after cath noted elevated filling pressure on 10/23/2014  - mildly elevated trop related to chronic HF, known coronary anatomy since just cathed 4 days ago, occasional atypical sharp stabbing CP below L rib worse with cough  - no LE edema, rale on exam or JVD, does not appear to be fluid overloaded, family medicine placed patient on 40mg  BID IV lasix, will check with Dr. Haroldine Laws. If worsen, may need primacor in the future.   - patient has more chronic orthopnea  3. NICM  - Echo 08/2014 EF 15%, diffuse hypokinesis, severe TR/MR  - cath 10/23/2013 nonobstructive CAD, mildly low CI 2.1  - patient has demonstrated compliance and cessation of ETOH and cocaine since Oct, current urine drug test pending. Maybe a candidate for ICD placement in the future  4. Persistent atrial fibrillation  - rate poorly controlled, previously his HR was controlled on 18.75 BID of coreg, unclear when the dose was decreased to 12.5mg  BID, will leave at 12.5 for now  - per patient, he has been very symptomatic on PO amiodarone, will discuss Dr. Haroldine Laws regarding d/c amiodarone  5. HCV 6. COPD 7. Colon cancer: seen by Dr. Julien Nordmann on 1/6, missed his PET  and Surgery followup. Will need further workup 8. 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 We discussed all aspects of the encounter. I agree with the assessment and plan as stated above.  Currently I think volume status is ok despite ongoing orthopnea. Can switch back to po lasix.   Recurrent syncope sounds like defecation syncope but is at risk for VT. We discussed event monitor vs LifeVest and he prefers LifeVest. Have contacted rep.   Continue amio for AF.   Can go home in am once LifeVest placed.   Benay Spice 3:39 PM

## 2014-10-27 NOTE — Progress Notes (Signed)
1500 spoken with .Hagar,PA.Made him aware of above. Lyla Glassing came in to see the pt

## 2014-10-27 NOTE — ED Notes (Signed)
Called pharmacy regarding medications coming from [place].

## 2014-10-27 NOTE — Progress Notes (Addendum)
Clinical Social Work Department BRIEF PSYCHOSOCIAL ASSESSMENT 10/27/2014  Patient:  Paul Clayton, Paul Clayton     Account Number:  [id number]     [place] date:  10/27/2014  Clinical Social Worker:  Forest Gleason  Date/Time:  10/27/2014 09:02 AM  Referred by:  Physician  Date Referred:  10/27/2014 Referred for  Homelessness  Psychosocial assessment   Other Referral:   referred for additional resources due to housing situation and finances   Interview type:  Patient Other interview type:   Patient gave permission to speak with daughter  Rushie Goltz [phone]    PSYCHOSOCIAL DATA Living Status:  ALONE Admitted from facility:   Level of care:   Primary support name:  Daughter  [place] Primary support relationship to patient:  FAMILY Degree of support available:   Patient reports she calls and checks in on him and has his back, but he cannot live with her.  Degree is medium support as daughter can provide emotional support    CURRENT CONCERNS Current Concerns  Other - See comment   Other Concerns:   Housing situation    SOCIAL WORK ASSESSMENT / PLAN LCSW met with patient at the bedside. Patient alone in room, alert and oriented to self, time and place. He reports he has been homeless since November 2015. Currently he is staying at the [facility] and can return to this dwelling. Reports he has been clean from [place] since October 2015 when he came back to [place] to be with daughter and grandchildren.  Patient reports he uses the bus as main transportation and buys a monthly voucher to make all appointments.  Reports low degree of suport from friends and family, reports he loves his daughter, but he manages independently.   Assessment/plan status:  Psychosocial Support/Ongoing Assessment of Needs Other assessment/ plan:   Patient being admitted to the unit for medical needs.  Will have unit CSW follow up with patient for any additional needs   Information/referral to community  resources:   Discussed [employer] with patient as mode of transportation. He is also aware of all shelter and homeless resources currently in the city.  Reports he is waiting on a spot at an apartment that he has already been approved for.    PATIENT'S/FAMILY'S RESPONSE TO PLAN OF CARE: Patient was very pleasant and cooperative. Appreciative of visit and information. Aware of being admitted and agreeable to treatment plan.  Met with patient daughter who reports she looks after father but wants him in a stable home. Reports his case managers name at [facility] is [place] and she is working on getting him a place at [employer] for permanent housing.       Lane Hacker, MSW Clinical Social Work: Emergency Room [phone]

## 2014-10-27 NOTE — ED Notes (Signed)
Attempted to call patients daughter Phineas Douglas at [phone] with only a busy signal.

## 2014-10-27 NOTE — ED Notes (Signed)
Sprite given per request

## 2014-10-27 NOTE — H&P (Signed)
[facility] Admission History and Physical Service Pager: [phone]  Patient name: Paul Clayton Medical record number: [id number] Date of birth: 08-22-1946 Age: 69 y.o. Gender: male  Primary Care Provider: [name], MD Consultants: none Code Status: Full code  Chief Complaint: Dyspnea and syncope  Assessment and Plan: Paul Clayton is a 69 y.o. male presenting with multiple complaints of syncope, dyspnea, and abdominal pain. PMH is significant for CHF with EF 15%, COPD, hepatitis C, persistent atrial fibrillation  # Syncope: Etiology not entirely clear. He does not seem to have had a specific prodrome, but the history is quite scattered. Likely related to significant cardiac disease. -Monitor on telemetry -Consult cardiology given recent significant workup including cath, echo -Check orthostatic vital signs  # Dyspnea: Reported wheezing and increased sputum production accompanied with cough. Likely represents COPD exacerbation.It is quite unclear exactly how long the symptoms have been going on since the history so difficult to obtain. Doubt significant component of CHF since patient has minimal lower extremity edema. Had heart catheterization several days ago which showed nonischemic cardiomyopathy, likely related to prior alcohol and drug use.  -s/p Solu-Medrol 125 mg injection, now prednisone 40 mg daily for 4 more days  -Doxycycline 100 mg twice daily for 7 days  -supplement oxygen as needed -Incentive spirometry  -Given report of pleuritic chest pain and known tachycardia, check d-dimer  -Continue current CHF meds including carvedilol, and spironolactone - Lasix 40 mg IV twice daily  -cycle troponins -EKG in AM  # Atrial fibrillation: Mildly tachycardic but presently rate controlled. Recently started on Xarelto and amiodarone for this. There are plans to cardiovert him after 4 weeks of continuous Xarelto use.  -Continue Xarelto, monitor for  RVR -Consult cardiology CHF team regarding utility of continuing amiodarone since patient does not feel well on this medicine  # History of alcohol abuse: Noted in the history in chart. Patient denied any current alcohol use. Will monitor on CIWA protocol out of an abundance of caution.   # Abdominal pain: According to patient was started about 20 minutes before I examined him. His abdomen was nontender for me on exam, with no masses or organomegaly. I think we can continue to observe this. Consider imaging if abdominal exam worsens  FEN/GI: SLIV, HH diet Prophylaxis: on xarelto  Disposition:Admit to inpatient, disposition pending clinical improvement  History of Present Illness: Paul Clayton is a 69 y.o. male presenting with multiple complaints.  Patient states that around 12:30 this evening he was sitting on the toilet, and had a syncopal event. He essentially leaned over and lightly bumped his head on an item in the bathroom, but states that he did lose consciousness. He denies any current pain on the area where his head hit. He also is endorsing shortness of breath, increased sputum production, and cough. He's been wheezing as well. He states he may have had a fever on arrival. He attributes much of this to recently starting amiodarone for his atrial fibrillation. He feels as though his balance is off. He's had decreased appetite but is generally drinking okay. He also reports an episode of syncope about 3 weeks ago. He denies having significant lower extremity edema. He has noticed increased in his weight recently. He does endorse that he had some left-sided chest pain, but improved after he got some medication in the emergency room.  Review Of Systems: Per HPI. Otherwise 12 point review of systems was performed and was unremarkable.  Patient Active  Problem List   Diagnosis Date Noted  . Chronic systolic CHF (congestive heart failure) 10/17/2014  . Homeless single person   . Atrial  fibrillation with RVR 10/13/2014  . High risk social situation 09/30/2014  . Colon cancer 09/26/2014  . Dyspnea 09/22/2014  . Chest pain 09/16/2014  . Syncope   . COPD exacerbation   . HFrEF (heart failure with reduced ejection fraction) 09/12/2014  . A-fib 09/12/2014  . COPD (chronic obstructive pulmonary disease) 09/12/2014   Past Medical History: Past Medical History  Diagnosis Date  . A-fib   . Hypertension   . CHF (congestive heart failure)   . COPD (chronic obstructive pulmonary disease)   . Hepatitis C   . Tobacco abuse    Past Surgical History: Past Surgical History  Procedure Laterality Date  . Cardioversion    . Colonoscopy    . Left and right heart catheterization with coronary angiogram N/A 10/23/2014    Procedure: LEFT AND RIGHT HEART CATHETERIZATION WITH CORONARY ANGIOGRAM;  Surgeon: Larey Dresser, MD;  Location[facility] C[facility] LAB;  Service: Cardiovascular;  Laterality: N/A;   Social History: History  Substance Use Topics  . Smoking status: Former Smoker    Quit date: 07/02/2014  . Smokeless tobacco: Never Used  . Alcohol Use: 0.0 oz/week    0 Not specified per week   Additional social historDenies current alcohol, tobacco, drug use. Has a significant history for cocaine abuse and alcohol abuse, but states he has quit. also refer to relevant sections of EMR.  Family History: Family History  Problem Relation Age of Onset  . Hypertension Mother    Allergies and Medications: No Known Allergies No current facility-administered medications on file prior to encounter.   Current Outpatient Prescriptions on File Prior to Encounter  Medication Sig Dispense Refill  . albuterol-ipratropium (COMBIVENT) 18-103 MCG/ACT inhaler Inhale 1 puff into the lungs every 4 (four) hours as needed for wheezing or shortness of breath. 1 Inhaler 1  . amiodarone (PACERONE) 200 MG tablet Take 1 tablet (200 mg total) by mouth 2 (two) times daily. 60 tablet 3  . carvedilol (COREG) 6.25  MG tablet Take 3 tablets (18.75 mg total) by mouth 2 (two) times daily with a meal. (Patient taking differently: Take 12.5 mg by mouth 2 (two) times daily with a meal. ) 180 tablet 2  . furosemide (LASIX) 20 MG tablet Take 40 mg by mouth daily.     . mometasone-formoterol (DULERA) 200-5 MCG/ACT AERO Inhale 2 puffs into the lungs 2 (two) times daily. (Patient taking differently: Inhale 2 puffs into the lungs daily. ) 1 Inhaler 0  . rivaroxaban (XARELTO) 20 MG TABS tablet Take 1 tablet (20 mg total) by mouth daily with supper. 30 tablet 3  . spironolactone (ALDACTONE) 25 MG tablet Take 0.5 tablets (12.5 mg total) by mouth daily. 15 tablet 3  . tiotropium (SPIRIVA) 18 MCG inhalation capsule Place 18 mcg into inhaler and inhale daily.    Marland Kitchen atorvastatin (LIPITOR) 20 MG tablet Take 1 tablet (20 mg total) by mouth daily. (Patient not taking: Reported on 10/27/2014) 30 tablet 0  . digoxin (LANOXIN) 0.25 MG tablet Take 1 tablet (0.25 mg total) by mouth daily. (Patient not taking: Reported on 10/27/2014) 30 tablet 0  . lisinopril (PRINIVIL,ZESTRIL) 5 MG tablet Take 1 tablet (5 mg total) by mouth daily. (Patient not taking: Reported on 10/27/2014) 30 tablet 0    Objective: BP 130/80 mmHg  Pulse 76  Temp(Src) 99.7 F (37.6 C) (Oral)  Resp 30  Ht 6' (1.829 m)  Wt 182 lb (82.555 kg)  BMI 24.68 kg/m2  SpO2 100% Exam: General: no acute distress, pleasant, cooperative ENT:  Normocephalic, atraumatic, nasal cannula in place, moist mucous membranes Cardiovascular: Irregularly irregular, no murmurs Respiratory: Generally clear to auscultation bilaterally, respiratory effort is normal with the exception of frequent grunting. Patient states he does this at baseline all the  time.  Abdomen: Soft, nontender to palpation, nondistended, no organomegaly Extremities: no lower extremity edema bilat Skin: no rashes noted Neuro: grossly nonfocal, speech normal and intelligible, follows commands  Labs and Imaging: CBC  BMET   Recent Labs Lab 10/27/14 0335  WBC 8.3  HGB 12.8*  HCT 38.4*  PLT 206    Recent Labs Lab 10/27/14 0335  NA 135  K 4.4  CL 106  CO2 22  BUN 12  CREATININE 0.94  GLUCOSE 119*  CALCIUM 8.6     Chest x-ray: Stable mild cardiomegaly, no acute pulmonary process.  EKG TWI in lateral leads, not new. afib.  Leeanne Rio, MD 10/27/2014, 5:39 AM PGY-3, [place] Intern pager: [phone], text pages welcome

## 2014-10-27 NOTE — ED Provider Notes (Signed)
CSN: [id number]     Arrival date & time 10/27/14  0317 History  This chart was scribed for Julianne Rice, MD by Rayfield Citizen, ED Scribe. This patient was seen in room A13C/A13C and the patient's care was started at 3:25 AM.    Chief Complaint  Patient presents with  . Shortness of Breath   HPI   HPI Comments: Paul Clayton is a 69 y.o. male with past medical history of a-fib, HTN, CHF, COPD, Hep C who presents to the Emergency Department complaining of SOB. Patient reports that he felt fine at bedtime tonight, but he had a coughing spell (productive, yellow sputum) around midnight and could not "catch his breath." He explains that he "passed out" and had to rest against the wall as a result of this spell; he cannot specify how long this lasted. His symptoms worsen with exertion. He denies head injury, fever. He believes this incident is related to his Lasix, which is a new medication for him.   He also had pain in his left ribs, which worsens with deep breathing. He denies pain at present.   Patient is a former smoker.   Past Medical History  Diagnosis Date  . A-fib   . Hypertension   . CHF (congestive heart failure)   . COPD (chronic obstructive pulmonary disease)   . Hepatitis C   . Tobacco abuse    Past Surgical History  Procedure Laterality Date  . Cardioversion    . Colonoscopy    . Left and right heart catheterization with coronary angiogram N/A 10/23/2014    Procedure: LEFT AND RIGHT HEART CATHETERIZATION WITH CORONARY ANGIOGRAM;  Surgeon: Larey Dresser, MD;  Location: [facility] CATH LAB;  Service: Cardiovascular;  Laterality: N/A;   Family History  Problem Relation Age of Onset  . Hypertension Mother    History  Substance Use Topics  . Smoking status: Former Smoker    Quit date: 07/02/2014  . Smokeless tobacco: Never Used  . Alcohol Use: 0.0 oz/week    0 Not specified per week    Review of Systems  Constitutional: Negative for fever and chills.  Respiratory: Positive  for cough, shortness of breath and wheezing.   Cardiovascular: Positive for chest pain. Negative for palpitations and leg swelling.  Gastrointestinal: Negative for nausea, vomiting, abdominal pain and diarrhea.  Musculoskeletal: Negative for myalgias, back pain, neck pain and neck stiffness.  Skin: Negative for rash and wound.  Neurological: Negative for dizziness, weakness, light-headedness and numbness.  All other systems reviewed and are negative.     Allergies  Review of patient's allergies indicates no known allergies.  Home Medications   Prior to Admission medications   Medication Sig Start Date End Date Taking? Authorizing Provider  albuterol-ipratropium (COMBIVENT) 18-103 MCG/ACT inhaler Inhale 1 puff into the lungs every 4 (four) hours as needed for wheezing or shortness of breath. 09/12/14  Yes Olam Idler, MD  amiodarone (PACERONE) 200 MG tablet Take 1 tablet (200 mg total) by mouth 2 (two) times daily. 10/17/14  Yes Larey Dresser, MD  carvedilol (COREG) 6.25 MG tablet Take 3 tablets (18.75 mg total) by mouth 2 (two) times daily with a meal. Patient taking differently: Take 12.5 mg by mouth 2 (two) times daily with a meal.  10/16/14  Yes Dimas Chyle, MD  furosemide (LASIX) 20 MG tablet Take 40 mg by mouth daily.    Yes Historical Provider, MD  mometasone-formoterol (DULERA) 200-5 MCG/ACT AERO Inhale 2 puffs into the lungs  2 (two) times daily. Patient taking differently: Inhale 2 puffs into the lungs daily.  09/12/14  Yes Olam Idler, MD  rivaroxaban (XARELTO) 20 MG TABS tablet Take 1 tablet (20 mg total) by mouth daily with supper. 10/17/14  Yes Larey Dresser, MD  spironolactone (ALDACTONE) 25 MG tablet Take 0.5 tablets (12.5 mg total) by mouth daily. 10/17/14  Yes Larey Dresser, MD  tiotropium (SPIRIVA) 18 MCG inhalation capsule Place 18 mcg into inhaler and inhale daily.   Yes Historical Provider, MD  atorvastatin (LIPITOR) 20 MG tablet Take 1 tablet (20 mg  total) by mouth daily. Patient not taking: Reported on 10/27/2014 09/18/14   Dimas Chyle, MD  digoxin (LANOXIN) 0.25 MG tablet Take 1 tablet (0.25 mg total) by mouth daily. Patient not taking: Reported on 10/27/2014 09/18/14   Dimas Chyle, MD  lisinopril (PRINIVIL,ZESTRIL) 5 MG tablet Take 1 tablet (5 mg total) by mouth daily. Patient not taking: Reported on 10/27/2014 09/19/14   Dimas Chyle, MD   BP 130/80 mmHg  Pulse 76  Temp(Src) 99.7 F (37.6 C) (Oral)  Resp 30  Ht 6' (1.829 m)  Wt 182 lb (82.555 kg)  BMI 24.68 kg/m2  SpO2 100% Physical Exam  Constitutional: He is oriented to person, place, and time. He appears well-developed and well-nourished. No distress.  HENT:  Head: Normocephalic and atraumatic.  Mouth/Throat: Oropharynx is clear and moist. No oropharyngeal exudate.  Eyes: EOM are normal. Pupils are equal, round, and reactive to light.  Neck: Normal range of motion. Neck supple.  Cardiovascular: Normal rate and regular rhythm.   Pulmonary/Chest: Effort normal and breath sounds normal. No respiratory distress. He has no wheezes. He has no rales. He exhibits no tenderness.  Speaking in full sentences. Tachypnea. Increase work of breathing. Expiratory wheezes left greater than right.  Abdominal: Soft. Bowel sounds are normal. He exhibits no distension and no mass. There is no tenderness. There is no rebound and no guarding.  Musculoskeletal: Normal range of motion. He exhibits no edema or tenderness.  No calf swelling or tenderness.  Neurological: He is alert and oriented to person, place, and time.  Moves all extremities without deficit. Sensation is grossly intact.  Skin: Skin is warm and dry. No rash noted. No erythema.  Psychiatric: He has a normal mood and affect. His behavior is normal.  Nursing note and vitals reviewed.   ED Course  Procedures   DIAGNOSTIC STUDIES: Oxygen Saturation is 99% on [place], normal by my interpretation.    COORDINATION OF CARE: 3:37 AM  Discussed treatment plan with pt at bedside and pt agreed to plan.   Labs Review Labs Reviewed  CBC WITH DIFFERENTIAL - Abnormal; Notable for the following:    Hemoglobin 12.8 (*)    HCT 38.4 (*)    Monocytes Relative 14 (*)    Monocytes Absolute 1.2 (*)    All other components within normal limits  COMPREHENSIVE METABOLIC PANEL - Abnormal; Notable for the following:    Glucose, Bld 119 (*)    Albumin 3.1 (*)    GFR calc non Af Amer 85 (*)    All other components within normal limits  BRAIN NATRIURETIC PEPTIDE - Abnormal; Notable for the following:    B Natriuretic Peptide 607.9 (*)    All other components within normal limits  TROPONIN I - Abnormal; Notable for the following:    Troponin I 0.05 (*)    All other components within normal limits    Imaging Review Dg  Chest Port 1 View  10/27/2014   CLINICAL DATA:  Shortness of breath, history of COPD and CHF.  EXAM: PORTABLE CHEST - 1 VIEW  COMPARISON:  Chest radiograph October 22, 2014  FINDINGS: The cardiac silhouette is mildly enlarged, unchanged. Mediastinal silhouette is nonsuspicious. No pleural effusions or focal consolidations. No pneumothorax. Soft tissue planes and included osseous structures are nonsuspicious.  IMPRESSION: Stable mild cardiomegaly, no acute pulmonary process.   Electronically Signed   By: Elon Alas   On: 10/27/2014 03:55     EKG Interpretation None      Date: 10/27/2014  Rate: 107  Rhythm: atrial fibrillation  QRS Axis: normal  Intervals: normal  ST/T Wave abnormalities: nonspecific T wave changes  Conduction Disutrbances:none  Narrative Interpretation:   Old EKG Reviewed: unchanged   MDM   Final diagnoses:  SOB (shortness of breath)    I personally performed the services described in this documentation, which was scribed in my presence. The recorded information has been reviewed and considered.   Patient will mildly improved breathing after treatment. Continues to have extreme  wheezing. Continues to have increased work of breathing. Discussed with family medicine resident will admit to the hospital.  Julianne Rice, MD 10/27/14 [phone]

## 2014-10-27 NOTE — ED Notes (Signed)
Dr Yelverton at bedside.  

## 2014-10-27 NOTE — ED Notes (Addendum)
PER EMS: pt from home, reports weakness and SOB, productive cough of yellow mucous with sharp chest pain only when he coughs, started last Thursday. Rhonchi in lower lung fields per EMS. Pt placed on Amiodarone a week ago for A-fib. Was here in the 4th for Cardiac cath but was clear. HR-90-120 afib, O2-99% RA. 120/64. Pt was reporting nausea and EMS adm 4mg  of zofran.

## 2014-10-28 ENCOUNTER — Other Ambulatory Visit: Payer: Self-pay

## 2014-10-28 DIAGNOSIS — J441 Chronic obstructive pulmonary disease with (acute) exacerbation: Principal | ICD-10-CM

## 2014-10-28 LAB — BASIC METABOLIC PANEL
ANION GAP: 4 — AB (ref 5–15)
BUN: 17 mg/dL (ref 6–23)
CALCIUM: 8.6 mg/dL (ref 8.4–10.5)
CHLORIDE: 102 meq/L (ref 96–112)
CO2: 26 mmol/L (ref 19–32)
Creatinine, Ser: 0.9 mg/dL (ref 0.50–1.35)
GFR calc Af Amer: >90 mL/min (ref >90)
GFR calc non Af Amer: 86 mL/min — ABNORMAL LOW (ref >90)
Glucose, Bld: 140 mg/dL — ABNORMAL HIGH (ref 70–99)
Potassium: 4.5 mmol/L (ref 3.5–5.1)
Sodium: 132 mmol/L — ABNORMAL LOW (ref 135–145)

## 2014-10-28 LAB — DRUGS OF ABUSE SCREEN W/O ALC, ROUTINE URINE
Amphetamine Screen, Ur: NEGATIVE
BARBITURATE QUANT UR: NEGATIVE
Benzodiazepines.: NEGATIVE
Cocaine Metabolites: NEGATIVE
Creatinine,U: 21.7 mg/dL
Marijuana Metabolite: NEGATIVE
Methadone: NEGATIVE
Opiate Screen, Urine: NEGATIVE
PHENCYCLIDINE (PCP): NEGATIVE
Propoxyphene: NEGATIVE

## 2014-10-28 MED ORDER — SENNA 8.6 MG PO TABS
1.0000 | ORAL_TABLET | Freq: Every day | ORAL | Status: DC
  Administered 2014-10-28: 8.6 mg via ORAL
  Filled 2014-10-28: qty 1

## 2014-10-28 MED ORDER — PREDNISONE 20 MG PO TABS: 40.0000 mg | ORAL_TABLET | Freq: Every day | ORAL | Status: AC

## 2014-10-28 MED ORDER — DOXYCYCLINE HYCLATE 100 MG PO TABS: 100.0000 mg | ORAL_TABLET | Freq: Two times a day (BID) | ORAL | Status: AC

## 2014-10-28 MED ORDER — CARVEDILOL 6.25 MG PO TABS: 12.5000 mg | ORAL_TABLET | Freq: Two times a day (BID) | ORAL | Status: DC

## 2014-10-28 MED ORDER — SENNA 8.6 MG PO TABS: 1.0000 | ORAL_TABLET | Freq: Every day | ORAL | Status: DC

## 2014-10-28 NOTE — Progress Notes (Signed)
Discharge instructions reviewed with patient. All questions answered at this time. Life Vest in placed. Instructions provided. Awaiting for transportation from daughter,   [place], Therapist, sports.

## 2014-10-28 NOTE — Progress Notes (Signed)
Family Medicine Teaching Service Daily Progress Note Intern Pager: [phone]  Patient name: Paul Clayton Medical record number: [id number] Date of birth: 29-Nov-1945 Age: 69 y.o. Gender: male  Primary Care Provider: [name], MD Consultants: Cardiology Code Status: Full  Assessment and Plan: 69 y.o. male presenting with multiple complaints of syncope, dyspnea, and abdominal pain. PMH is significant for sCHF with EF 15%, COPD, hepatitis C, persistent atrial fibrillation, colon cancer  # Syncope: Suspected to be related to defecation. At risk for VT. - Orthostatics-   - Lying: 120/90, 96  - Sitting: 122/88, 115  - Standing: 112/67, 109 - Troponins 0/05>0.04>0.03 - UDS negative - Cardiology Consulted. Appreciate recommendations.  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 Plans to cardiovert him after 4 weeks of continuous Xarelto use.  -Continue Xarelto, monitor for RVR  FEN/GI: SLIV, HH diet Prophylaxis: Xarelto  Disposition: Discharge pending arrival of Life Vest. Reports being homeless since November 2015, but has been staying at [facility] and can return after discharge.  Subjective:  No acute complaints overnight. States he is feeling better today and denies shortness of breath or chest pain. States he has already spoken with cardiology and is hoping to be discharged once his lifevest arrives.  No further complaints today.  Objective: Temp:  [98 F (36.7 C)-98.5 F (36.9 C)] 98.5 F (36.9 C) (01/09 0600) Pulse Rate:  [86-120] 92 (01/09 0600) Resp:  [17-28] 17 (01/09 0600) BP: (100-131)/(60-96) 106/66 mmHg (01/09 0600) SpO2:  [97 %-99 %] 98 % (01/09 0600) Weight:  [176 lb 4.8 oz (79.969 kg)-181 lb 7 oz (82.3 kg)] 176 lb 4.8 oz (79.969 kg) (01/09 0600) Physical Exam: General: 69yo male resting comfortably in bed in no apparent distress Cardiovascular: S1 and S2 noted, no murmurs/rubs/gallops Respiratory: Clear to auscultation bilaterally, no wheezes/rales/rhonchi Abdomen: Soft and nondistended, bowel sounds noted Extremities: No edema noted.  Laboratory:  Recent Labs Lab 10/27/14 0335  WBC 8.3  HGB 12.8*  HCT 38.4*  PLT 206    Recent Labs Lab 10/27/14 0335 10/28/14 0329  NA 135 132*  K 4.4 4.5  CL 106 102  CO2 22 26  BUN 12 17  CREATININE 0.94 0.90  CALCIUM 8.6 8.6  PROT 6.7  --   BILITOT 1.2  --   ALKPHOS 54  --   ALT 29  --   AST 33  --   GLUCOSE 119* 140*   - Troponins 0/05>0.04>0.03 - UDS negative - D dimer 0.41 - BNP 607.9  Dg Chest Port 1 View  10/27/2014   CLINICAL DATA:  Shortness of breath, history of COPD and CHF.  EXAM: PORTABLE CHEST - 1 VIEW  COMPARISON:  Chest radiograph October 22, 2014  FINDINGS: The cardiac silhouette is mildly enlarged, unchanged. Mediastinal silhouette is nonsuspicious. No pleural effusions or focal consolidations. No pneumothorax. Soft tissue planes and included osseous structures are nonsuspicious.  IMPRESSION: Stable mild cardiomegaly,  no acute pulmonary process.   Electronically Signed   By: Elon Alas   On: 10/27/2014 03:55   Lorna Few, DO 10/28/2014, 8:19 AM PGY-1, [place] Intern pager: [phone], text pages welcome

## 2014-10-28 NOTE — Discharge Instructions (Signed)
The Cardiology Office will contact you on Monday (1/11) concerning an appointment time. Please follow Dr. Clayborne Dana instructions concerning the lifevest. Please complete your courses of antibiotics and prednisone as instructed. Please contact the clinic if you have any trouble affording your medications.  Vasovagal Syncope, Adult Syncope, commonly known as fainting, is a temporary loss of consciousness. It occurs when the blood flow to the brain is reduced. Vasovagal syncope (also called neurocardiogenic syncope) is a fainting spell in which the blood flow to the brain is reduced because of a sudden drop in heart rate and blood pressure. Vasovagal syncope occurs when the brain and the cardiovascular system (blood vessels) do not adequately communicate and respond to each other. This is the most common cause of fainting. It often occurs in response to fear or some other type of emotional or physical stress. The body has a reaction in which the heart starts beating too slowly or the blood vessels expand, reducing blood pressure. This type of fainting spell is generally considered harmless. However, injuries can occur if a person takes a sudden fall during a fainting spell.  CAUSES  Vasovagal syncope occurs when a person's blood pressure and heart rate decrease suddenly, usually in response to a trigger. Many things and situations can trigger an episode. Some of these include:   Pain.   Fear.   The sight of blood or medical procedures, such as blood being drawn from a vein.   Common activities, such as coughing, swallowing, stretching, or going to the bathroom.   Emotional stress.   Prolonged standing, especially in a warm environment.   Lack of sleep or rest.   Prolonged lack of food.   Prolonged lack of fluids.   Recent illness.  The use of certain drugs that affect blood pressure, such as cocaine, alcohol, marijuana, inhalants, and opiates.  SYMPTOMS  Before the fainting  episode, you may:   Feel dizzy or light headed.   Become pale.  Sense that you are going to faint.   Feel like the room is spinning.   Have tunnel vision, only seeing directly in front of you.   Feel sick to your stomach (nauseous).   See spots or slowly lose vision.   Hear ringing in your ears.   Have a headache.   Feel warm and sweaty.   Feel a sensation of pins and needles. During the fainting spell, you will generally be unconscious for no longer than a couple minutes before waking up and returning to normal. If you get up too quickly before your body can recover, you may faint again. Some twitching or jerky movements may occur during the fainting spell.  DIAGNOSIS  Your caregiver will ask about your symptoms, take a medical history, and perform a physical exam. Various tests may be done to rule out other causes of fainting. These may include blood tests and tests to check the heart, such as electrocardiography, echocardiography, and possibly an electrophysiology study. When other causes have been ruled out, a test may be done to check the body's response to changes in position (tilt table test). TREATMENT  Most cases of vasovagal syncope do not require treatment. Your caregiver may recommend ways to avoid fainting triggers and may provide home strategies for preventing fainting. If you must be exposed to a possible trigger, you can drink additional fluids to help reduce your chances of having an episode of vasovagal syncope. If you have warning signs of an oncoming episode, you can respond by positioning yourself  favorably (lying down). If your fainting spells continue, you may be given medicines to prevent fainting. Some medicines may help make you more resistant to repeated episodes of vasovagal syncope. Special exercises or compression stockings may be recommended. In rare cases, the surgical placement of a pacemaker is considered. HOME CARE INSTRUCTIONS   Learn to  identify the warning signs of vasovagal syncope.   Sit or lie down at the first warning sign of a fainting spell. If sitting, put your head down between your legs. If you lie down, swing your legs up in the air to increase blood flow to the brain.   Avoid hot tubs and saunas.  Avoid prolonged standing.  Drink enough fluids to keep your urine clear or pale yellow. Avoid caffeine.  Increase salt in your diet as directed by your caregiver.   If you have to stand for a long time, perform movements such as:   Crossing your legs.   Flexing and stretching your leg muscles.   Squatting.   Moving your legs.   Bending over.   Only take over-the-counter or prescription medicines as directed by your caregiver. Do not suddenly stop any medicines without asking your caregiver first. SEEK MEDICAL CARE IF:   Your fainting spells continue or happen more frequently in spite of treatment.   You lose consciousness for more than a couple minutes.  You have fainting spells during or after exercising or after being startled.   You have new symptoms that occur with the fainting spells, such as:   Shortness of breath.  Chest pain.   Irregular heartbeat.   You have episodes of twitching or jerky movements that last longer than a few seconds.  You have episodes of twitching or jerky movements without obvious fainting. SEEK IMMEDIATE MEDICAL CARE IF:   You have injuries or bleeding after a fainting spell.   You have episodes of twitching or jerky movements that last longer than 5 minutes.   You have more than one spell of twitching or jerky movements before returning to consciousness after fainting. MAKE SURE YOU:   Understand these instructions.  Will watch your condition.  Will get help right away if you are not doing well or get worse. Document Released: 09/22/2012 Document Reviewed: 09/22/2012 [facility] Patient Information 2015 [employer]. This information is  not intended to replace advice given to you by your health care provider. Make sure you discuss any questions you have with your health care provider.

## 2014-10-28 NOTE — Progress Notes (Signed)
CSW met with patient today. He is being d/c'd today per MD with a Life Vest  (in place) and stated his daughter was going to come and pick him up. He stated that should she not be able to come and get him he states he would take the bus and has bus fare. CSW offered assistance and he denied any needs. Will return to [facility] and states that he still has his bed there. CSW signing off.  Lorie Phenix. Pauline Good, [place]

## 2014-10-29 NOTE — Discharge Summary (Signed)
[facility] Discharge Summary  Patient name: Paul Clayton Medical record number: [id number] Date of birth: Nov 14, 1945 Age: 69 y.o. Gender: male Date of Admission: 10/27/2014  Date of Discharge: 10/28/14 Admitting Physician: Blane Ohara McDiarmid, MD  Primary Care Provider: [name], MD Consultants: Cardiology  Indication for Hospitalization: Syncope, Dyspnea  Discharge Diagnoses/Problem List:  Syncope COPD Exacerbation sCHF NICM Persistent Atrial Fibrillation HCV  Disposition: Discharge Home  Discharge Condition: Stable  Brief Hospital Course:  Mr. Paul Clayton is a 69yo male who presented to the ED on 10/27/14 with syncope, dyspnea, and abdominal pain. Has heart catheterization on 10/23/13 showing nonobstructive CAD. Troponins slightly elevated to 0.05, however these trended down throughout hospitalization. UDS negative. Orthostatic vitals negative. D dimer negative. BNP 607.9. CXR showed stable cardiomegaly with no acute process. Persistent atrial fibrillation noted. Prescribed 18.75mg  BID of coreg, but patient reports taking 12.5mg  BID at admission. IV lasix given at admission. Initiated five day course of prednisone and seven day course of Doxycycline at admission.  Cardiology consulted. Recommend discharge with lifevest due to increased risk for VT. Follow up outpatient. Recommended transtion to oral Lasix on 1/8. Recommended continuing Amiodarone at discharge and continuing Mr. Paul Clayton reported dose of 12.5mg  BID coreg. Discharged on 1/9 following improvement of dyspnea and placement of life vest.  Issues for Follow Up:  - Consider addition of Primacor if indicated - Discharged on Day #2 of five day course of prednisone (end date 1/12) and Day #2 of seven day course of doxycycline (end date 1/) - Missed appointment with Dr. Julien Nordmann for PET and surgery followup for colon cancer. Will need to reschedule. - Follow up social situation. Currently homeless, but staying  at [facility] since November 2015. Verify he is able to afford medications.  Significant Procedures: None  Significant Labs and Imaging:   Recent Labs Lab 10/27/14 0335  WBC 8.3  HGB 12.8*  HCT 38.4*  PLT 206    Recent Labs Lab 10/27/14 0335 10/28/14 0329  NA 135 132*  K 4.4 4.5  CL 106 102  CO2 22 26  GLUCOSE 119* 140*  BUN 12 17  CREATININE 0.94 0.90  CALCIUM 8.6 8.6  ALKPHOS 54  --   AST 33  --   ALT 29  --   ALBUMIN 3.1*  --   - D dimer 0.41 - BNP 607.9 - Troponins 0/05>0.04>0.03 - UDS negative  Results/Tests Pending at Time of Discharge: None  Discharge Medications:    Medication List    TAKE these medications        albuterol-ipratropium 18-103 MCG/ACT inhaler  Commonly known as:  COMBIVENT  Inhale 1 puff into the lungs every 4 (four) hours as needed for wheezing or shortness of breath.     amiodarone 200 MG tablet  Commonly known as:  PACERONE  Take 1 tablet (200 mg total) by mouth 2 (two) times daily.     atorvastatin 20 MG tablet  Commonly known as:  LIPITOR  Take 1 tablet (20 mg total) by mouth daily.     carvedilol 6.25 MG tablet  Commonly known as:  COREG  Take 2 tablets (12.5 mg total) by mouth 2 (two) times daily with a meal.     digoxin 0.25 MG tablet  Commonly known as:  LANOXIN  Take 1 tablet (0.25 mg total) by mouth daily.     doxycycline 100 MG tablet  Commonly known as:  VIBRA-TABS  Take 1 tablet (100 mg total) by mouth 2 (  two) times daily.     furosemide 20 MG tablet  Commonly known as:  LASIX  Take 40 mg by mouth daily.     lisinopril 5 MG tablet  Commonly known as:  PRINIVIL,ZESTRIL  Take 1 tablet (5 mg total) by mouth daily.     mometasone-formoterol 200-5 MCG/ACT Aero  Commonly known as:  DULERA  Inhale 2 puffs into the lungs 2 (two) times daily.     predniSONE 20 MG tablet  Commonly known as:  DELTASONE  Take 2 tablets (40 mg total) by mouth daily with breakfast.     rivaroxaban 20 MG Tabs tablet   Commonly known as:  XARELTO  Take 1 tablet (20 mg total) by mouth daily with supper.     senna 8.6 MG Tabs tablet  Commonly known as:  SENOKOT  Take 1 tablet (8.6 mg total) by mouth daily.     spironolactone 25 MG tablet  Commonly known as:  ALDACTONE  Take 0.5 tablets (12.5 mg total) by mouth daily.     tiotropium 18 MCG inhalation capsule  Commonly known as:  SPIRIVA  Place 18 mcg into inhaler and inhale daily.        Discharge Instructions: Please refer to Patient Instructions section of EMR for full details.  Patient was counseled important signs and symptoms that should prompt return to medical care, changes in medications, dietary instructions, activity restrictions, and follow up appointments.   Follow-Up Appointments:     Follow-up Information    Follow up with Dimas Chyle, MD On 10/31/2014.   Specialty:  Family Medicine   Why:  @4 :00pm for Hospital Follow Up   Contact information:   [address] [address] [place], DO 10/29/2014, 3:47 PM PGY-1, [place]

## 2014-10-30 ENCOUNTER — Encounter: Payer: Self-pay | Admitting: Family Medicine

## 2014-10-30 DIAGNOSIS — F1911 Other psychoactive substance abuse, in remission: Secondary | ICD-10-CM | POA: Insufficient documentation

## 2014-10-31 ENCOUNTER — Inpatient Hospital Stay: Payer: Medicare Other | Admitting: Family Medicine

## 2014-11-01 ENCOUNTER — Ambulatory Visit (HOSPITAL_COMMUNITY)
Admission: RE | Admit: 2014-11-01 | Discharge: 2014-11-01 | Disposition: A | Discharge status: Home / Self Care | Payer: Medicare Other | Source: Ambulatory Visit | Attending: Internal Medicine | Admitting: Internal Medicine

## 2014-11-01 DIAGNOSIS — J449 Chronic obstructive pulmonary disease, unspecified: Secondary | ICD-10-CM | POA: Diagnosis not present

## 2014-11-01 DIAGNOSIS — I482 Chronic atrial fibrillation: Secondary | ICD-10-CM | POA: Diagnosis not present

## 2014-11-01 DIAGNOSIS — R918 Other nonspecific abnormal finding of lung field: Secondary | ICD-10-CM | POA: Diagnosis not present

## 2014-11-01 DIAGNOSIS — I429 Cardiomyopathy, unspecified: Secondary | ICD-10-CM | POA: Diagnosis not present

## 2014-11-01 DIAGNOSIS — I5022 Chronic systolic (congestive) heart failure: Secondary | ICD-10-CM | POA: Diagnosis not present

## 2014-11-01 DIAGNOSIS — R55 Syncope and collapse: Secondary | ICD-10-CM | POA: Diagnosis not present

## 2014-11-01 DIAGNOSIS — C189 Malignant neoplasm of colon, unspecified: Secondary | ICD-10-CM

## 2014-11-01 DIAGNOSIS — Z7901 Long term (current) use of anticoagulants: Secondary | ICD-10-CM | POA: Diagnosis not present

## 2014-11-01 DIAGNOSIS — B1920 Unspecified viral hepatitis C without hepatic coma: Secondary | ICD-10-CM | POA: Diagnosis not present

## 2014-11-01 DIAGNOSIS — Z7952 Long term (current) use of systemic steroids: Secondary | ICD-10-CM | POA: Diagnosis not present

## 2014-11-01 DIAGNOSIS — I10 Essential (primary) hypertension: Secondary | ICD-10-CM | POA: Diagnosis not present

## 2014-11-01 DIAGNOSIS — Z79899 Other long term (current) drug therapy: Secondary | ICD-10-CM | POA: Diagnosis not present

## 2014-11-01 DIAGNOSIS — Z85038 Personal history of other malignant neoplasm of large intestine: Secondary | ICD-10-CM | POA: Diagnosis not present

## 2014-11-01 LAB — GLUCOSE, CAPILLARY: GLUCOSE-CAPILLARY: 85 mg/dL (ref 70–99)

## 2014-11-01 MED ORDER — FLUDEOXYGLUCOSE F - 18 (FDG) INJECTION
9.1700 | Freq: Once | INTRAVENOUS | Status: AC | PRN
  Administered 2014-11-01: 9.17 via INTRAVENOUS

## 2014-11-03 ENCOUNTER — Ambulatory Visit (HOSPITAL_COMMUNITY)
Admission: RE | Admit: 2014-11-03 | Discharge: 2014-11-03 | Disposition: A | Discharge status: Home / Self Care | Payer: Medicare Other | Source: Ambulatory Visit | Attending: Cardiology | Admitting: Cardiology

## 2014-11-03 VITALS — BP 108/64 | HR 95 | Wt 191.5 lb

## 2014-11-03 DIAGNOSIS — J449 Chronic obstructive pulmonary disease, unspecified: Secondary | ICD-10-CM | POA: Insufficient documentation

## 2014-11-03 DIAGNOSIS — R55 Syncope and collapse: Secondary | ICD-10-CM | POA: Diagnosis not present

## 2014-11-03 DIAGNOSIS — I481 Persistent atrial fibrillation: Secondary | ICD-10-CM

## 2014-11-03 DIAGNOSIS — I482 Chronic atrial fibrillation: Secondary | ICD-10-CM | POA: Insufficient documentation

## 2014-11-03 DIAGNOSIS — J438 Other emphysema: Secondary | ICD-10-CM | POA: Diagnosis not present

## 2014-11-03 DIAGNOSIS — Z7952 Long term (current) use of systemic steroids: Secondary | ICD-10-CM | POA: Insufficient documentation

## 2014-11-03 DIAGNOSIS — B1920 Unspecified viral hepatitis C without hepatic coma: Secondary | ICD-10-CM | POA: Insufficient documentation

## 2014-11-03 DIAGNOSIS — I5022 Chronic systolic (congestive) heart failure: Secondary | ICD-10-CM | POA: Diagnosis not present

## 2014-11-03 DIAGNOSIS — I4819 Other persistent atrial fibrillation: Secondary | ICD-10-CM

## 2014-11-03 DIAGNOSIS — Z7901 Long term (current) use of anticoagulants: Secondary | ICD-10-CM | POA: Insufficient documentation

## 2014-11-03 DIAGNOSIS — Z79899 Other long term (current) drug therapy: Secondary | ICD-10-CM | POA: Insufficient documentation

## 2014-11-03 DIAGNOSIS — Z85038 Personal history of other malignant neoplasm of large intestine: Secondary | ICD-10-CM | POA: Insufficient documentation

## 2014-11-03 DIAGNOSIS — I429 Cardiomyopathy, unspecified: Secondary | ICD-10-CM | POA: Insufficient documentation

## 2014-11-03 DIAGNOSIS — I10 Essential (primary) hypertension: Secondary | ICD-10-CM | POA: Insufficient documentation

## 2014-11-03 MED ORDER — ATORVASTATIN CALCIUM 20 MG PO TABS: 20.0000 mg | ORAL_TABLET | Freq: Every day | ORAL | Status: DC

## 2014-11-03 MED ORDER — LISINOPRIL 5 MG PO TABS: 5.0000 mg | ORAL_TABLET | Freq: Every day | ORAL | Status: DC

## 2014-11-03 MED ORDER — DIGOXIN 125 MCG PO TABS: 0.1250 mg | ORAL_TABLET | Freq: Every day | ORAL | Status: DC

## 2014-11-03 NOTE — Patient Instructions (Signed)
Start Digoxin 0.125 mg daily  Start Lisinopril 5 mg daily  Start Atorvastatin 20 mg daily  Labs in 10 days (bmet)  Your physician recommends that you schedule a follow-up appointment in: 3 weeks

## 2014-11-03 NOTE — Progress Notes (Signed)
Patient ID: Paul Clayton, male   DOB: 12-Jun-1946, 69 y.o.   MRN: [id number] PCP: Dr. Berkley Harvey  69 yo with history of COPD, ETOH and cocaine abuse, HCV, colon cancer, persistent atrial fibrillation, and chronic systolic CHF presents for cardiology evaluation.  He moved recently to [place] from [place].  He says that he has had a cardiomyopathy known since 2012. This has been presumed to be due to cocaine and ETOH abuse.  He is also in persistent atrial fibrillation.  He says that he has been cardioverted twice, the last time was a few months ago in [place].  He is not sure whether he went back into NSR or not.  He is currently in atrial fibrillation.  He says that he quit ETOH, cocaine, and smoking in 10/15.  He was admitted to [facility] in 12/15 with atrial fibrillation and RVR. He had been out of his Coreg x 2 weeks.  He was diuresed and rate-controlled.  He was just discharged.  He had had an episode of syncope prior to admission that was thought to be orthostatic given low BP in the setting of atrial fibrillation with RVR.  Echo in 11/15 showed EF 15% with severe MR and TR and moderately decreased RV systolic function.    LHC/RHC in 1/16 showed no obstructive CAD and mildly elevated filling pressures.  Lasix was increased to 40 mg daily. He was admitted earlier in 1/16 with syncope, possibly defecation syncope.  He is now wearing a Lifevest.   He returns for follow up. Overall feeling ok. SOB with exertion. Smoking 2-3 cigarettes per day. Not able to weigh because he is at a shelter. Forgets evening medications one-two times a week. Living at shelter. Not following low salt diet. Not taking digoxin, lisinopril, or atorvastatin now.  Says someone told him to stop these meds.   He remains in atrial fibrillation.  He was started on amiodarone last appointment in preparation for DCCV attempt.   Labs (12/15): K 4.3, creatinine 0.82, AST normal, ALT 60, HCT 40.9, digoxin 0.7, BNP 4401, HIV  negative Lab (10/28/2014): K 4.5 Creatinine 0.90, digoxin 0.9, LFTs normal   PMH: 1. COPD: Quit smoking in 10/15.  2. Cocaine abuse: Quit in 10/15.  3. ETOH abuse: Quit ETOH in 10/15.  4. Atrial fibrillation: Chronic.  Reports DCCV x 2 in [place], last was a few months ago.  He is not sure whether he went back into NSR or not.  5. HCV 6. 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  PET scan in 12/15: probably no metastatic disease.   7. H/o syncope: Thought to be orthostatic. Also had vagal-type syncope associated with defecation.  8. Cardiomyopathy: Known since 2012.  Echo (11/15) with EF 15%, diffuse hypokinesis, severe LV dilation, severe MR (likely functional), moderate to severe LAE, dilated RV with moderately decreased systolic function, severe TR.  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  HIV negative.  LHC/RHC (1/16) with nonobstructive CAD, EF 20-25%, mean RA 9, PA 44/26, mean PCWP 21, CI 2.1.   SH: Prior ETOH, smoking, and cocaine abuse. Says he quit in 10/15.  Moved here from [place].  Daughter lives in [place]. Lives in a shelter.   FH: No premature CAD.   ROS: All systems reviewed and negative except as per HPI.   Current Outpatient Prescriptions  Medication Sig Dispense Refill  . albuterol-ipratropium (COMBIVENT) 18-103 MCG/ACT inhaler Inhale 1 puff into the lungs every 4 (four) hours  as needed for wheezing or shortness of breath. 1 Inhaler 1  . amiodarone (PACERONE) 200 MG tablet Take 1 tablet (200 mg total) by mouth 2 (two) times daily. 60 tablet 3  . carvedilol (COREG) 6.25 MG tablet Take 2 tablets (12.5 mg total) by mouth 2 (two) times daily with a meal. 180 tablet 2  . doxycycline (ADOXA) 100 MG tablet Take 100 mg by mouth 2 (two) times daily.    . furosemide (LASIX) 20 MG tablet Take 40 mg by mouth daily.     . mometasone-formoterol (DULERA) 200-5 MCG/ACT AERO Inhale 2 puffs into the lungs 2 (two) times daily.  (Patient taking differently: Inhale 2 puffs into the lungs daily as needed for wheezing. ) 1 Inhaler 0  . predniSONE (DELTASONE) 20 MG tablet Take 40 mg by mouth daily with breakfast.    . rivaroxaban (XARELTO) 20 MG TABS tablet Take 1 tablet (20 mg total) by mouth daily with supper. 30 tablet 3  . senna (SENOKOT) 8.6 MG TABS tablet Take 1 tablet (8.6 mg total) by mouth daily. 30 each 0  . spironolactone (ALDACTONE) 25 MG tablet Take 0.5 tablets (12.5 mg total) by mouth daily. 15 tablet 3  . tiotropium (SPIRIVA) 18 MCG inhalation capsule Place 18 mcg into inhaler and inhale daily.    Marland Kitchen atorvastatin (LIPITOR) 20 MG tablet Take 1 tablet (20 mg total) by mouth daily. (Patient not taking: Reported on 10/27/2014) 30 tablet 0  . digoxin (LANOXIN) 0.25 MG tablet Take 1 tablet (0.25 mg total) by mouth daily. (Patient not taking: Reported on 10/27/2014) 30 tablet 0  . lisinopril (PRINIVIL,ZESTRIL) 5 MG tablet Take 1 tablet (5 mg total) by mouth daily. (Patient not taking: Reported on 10/27/2014) 30 tablet 0   No current facility-administered medications for this encounter.   BP 108/64 mmHg  Pulse 95  Wt 191 lb 8 oz (86.864 kg)  SpO2 98% General: NAD Neck: No JVD, no thyromegaly or thyroid nodule.  Lungs: Clear to auscultation bilaterally with normal respiratory effort. CV: Nondisplaced PMI.  Heart irregular S1/S2, no S3/S4, 2/6 HSM LLSB and apex.  No peripheral edema.  No carotid bruit.  Normal pedal pulses.  Abdomen: Soft, nontender, no hepatosplenomegaly, no distention.  Skin: Intact without lesions or rashes.  Neurologic: Alert and oriented x 3.  Psych: Normal affect. Extremities: No clubbing or cyanosis.  HEENT: Normal.   Assessment/Plan: 1. Chronic systolic CHF: Cardiomyopathy with LV EF 15% and moderate RV dysfunction.  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  HIV - NR. He says that he has quit using cocaine and drinking ETOH since 10/15.   Probably NYHA class III.  He is not  volume overloaded on exam.   Continue Lasix 40 mg daily.   - Continue Coreg 12.5 mg bid - Start back on digoxin 0.125 mg daily and lisinopril 5 mg daily, not sure why they were stopped.   - Continue spironolactone 12.5 mg daily - BMET in 10 days.  - If he can stay off cocaine and ETOH, he will be an ICD candidate.  Repeat echo in 5/15, continue Lifevest until then given episode of syncope in setting of low EF.  - If he avoids substance abuse and follows up regularly, he may be a candidate for advanced therapies in the future.  2. Atrial fibrillation: Persistent.  He has had cardioversions in the past.  Given MR/TR and LA dilation, maintenance of NSR will be difficult but think it would be reasonable to try once  with antiarrhythmic use.  Continue amiodarone 200 mg bid.  Back on Xarelto. Will need to follow LFTs closely on Xarelto. Given Xarelto use, he can stop ASA.  - Will see him back in 3 wks and set up DCCV attempt.  3. HCV: LFTs normal on most recent check.  Follow closely with amiodarone.  4. COPD: Smoking a few cigarettes per day. Uses Spiriva.  5. Colon cancer: Ongoing evaluation by oncology. Has follow up with [facility] January 25th.  6. Syncope: Recent episode may have been vagal, associated with defecation.   Follow up in 3 weeks.   CLEGG,AMY NP-C  11/03/2014   Patient seen with NP, agree with the above note.  He is symptomatically stable and BP is stable.  He is wearing a Lifevest now since syncopal episode.  - Echo in 5/16 to decide on ICD.  - Restart digoxin, atorvastatin, and lisinopril 5 mg daily (not sure why he is off them).  - BMET 10 days.  - Followup in 3 wks and continue amiodarone/Xarelto.  Will set up DCCV when he comes back.   Loralie Champagne 11/03/2014

## 2014-11-08 ENCOUNTER — Encounter (HOSPITAL_COMMUNITY): Payer: Medicare Other

## 2014-11-09 ENCOUNTER — Inpatient Hospital Stay (HOSPITAL_COMMUNITY)
Admission: EM | Admit: 2014-11-09 | Discharge: 2014-11-11 | DRG: 312 | Disposition: A | Discharge status: Home / Self Care | Payer: Medicare Other | Attending: Internal Medicine | Admitting: Internal Medicine

## 2014-11-09 ENCOUNTER — Encounter (HOSPITAL_COMMUNITY): Payer: Self-pay

## 2014-11-09 ENCOUNTER — Emergency Department (HOSPITAL_COMMUNITY): Payer: Medicare Other

## 2014-11-09 DIAGNOSIS — R531 Weakness: Secondary | ICD-10-CM | POA: Diagnosis not present

## 2014-11-09 DIAGNOSIS — I509 Heart failure, unspecified: Secondary | ICD-10-CM

## 2014-11-09 DIAGNOSIS — Z79899 Other long term (current) drug therapy: Secondary | ICD-10-CM | POA: Diagnosis not present

## 2014-11-09 DIAGNOSIS — Z590 Homelessness: Secondary | ICD-10-CM

## 2014-11-09 DIAGNOSIS — Z7951 Long term (current) use of inhaled steroids: Secondary | ICD-10-CM | POA: Diagnosis not present

## 2014-11-09 DIAGNOSIS — I428 Other cardiomyopathies: Secondary | ICD-10-CM | POA: Diagnosis not present

## 2014-11-09 DIAGNOSIS — I081 Rheumatic disorders of both mitral and tricuspid valves: Secondary | ICD-10-CM | POA: Diagnosis not present

## 2014-11-09 DIAGNOSIS — Z7901 Long term (current) use of anticoagulants: Secondary | ICD-10-CM | POA: Diagnosis not present

## 2014-11-09 DIAGNOSIS — Z8249 Family history of ischemic heart disease and other diseases of the circulatory system: Secondary | ICD-10-CM | POA: Diagnosis not present

## 2014-11-09 DIAGNOSIS — R0602 Shortness of breath: Secondary | ICD-10-CM | POA: Diagnosis not present

## 2014-11-09 DIAGNOSIS — Z7952 Long term (current) use of systemic steroids: Secondary | ICD-10-CM | POA: Diagnosis not present

## 2014-11-09 DIAGNOSIS — R42 Dizziness and giddiness: Secondary | ICD-10-CM | POA: Diagnosis not present

## 2014-11-09 DIAGNOSIS — R079 Chest pain, unspecified: Secondary | ICD-10-CM

## 2014-11-09 DIAGNOSIS — I10 Essential (primary) hypertension: Secondary | ICD-10-CM | POA: Diagnosis present

## 2014-11-09 DIAGNOSIS — I481 Persistent atrial fibrillation: Secondary | ICD-10-CM | POA: Diagnosis not present

## 2014-11-09 DIAGNOSIS — Z87891 Personal history of nicotine dependence: Secondary | ICD-10-CM

## 2014-11-09 DIAGNOSIS — B1920 Unspecified viral hepatitis C without hepatic coma: Secondary | ICD-10-CM | POA: Diagnosis not present

## 2014-11-09 DIAGNOSIS — J441 Chronic obstructive pulmonary disease with (acute) exacerbation: Secondary | ICD-10-CM | POA: Diagnosis present

## 2014-11-09 DIAGNOSIS — I5023 Acute on chronic systolic (congestive) heart failure: Secondary | ICD-10-CM | POA: Diagnosis present

## 2014-11-09 DIAGNOSIS — Z85038 Personal history of other malignant neoplasm of large intestine: Secondary | ICD-10-CM | POA: Diagnosis not present

## 2014-11-09 DIAGNOSIS — R0789 Other chest pain: Secondary | ICD-10-CM | POA: Diagnosis not present

## 2014-11-09 DIAGNOSIS — R069 Unspecified abnormalities of breathing: Secondary | ICD-10-CM | POA: Diagnosis not present

## 2014-11-09 DIAGNOSIS — R55 Syncope and collapse: Secondary | ICD-10-CM | POA: Diagnosis not present

## 2014-11-09 DIAGNOSIS — R110 Nausea: Secondary | ICD-10-CM | POA: Diagnosis not present

## 2014-11-09 LAB — CBC
HCT: 39.3 % (ref 39.0–52.0)
Hemoglobin: 12.9 g/dL — ABNORMAL LOW (ref 13.0–17.0)
MCH: 29.9 pg (ref 26.0–34.0)
MCHC: 32.8 g/dL (ref 30.0–36.0)
MCV: 91.2 fL (ref 78.0–100.0)
PLATELETS: 196 10*3/uL (ref 150–400)
RBC: 4.31 MIL/uL (ref 4.22–5.81)
RDW: 14.1 % (ref 11.5–15.5)
WBC: 12 10*3/uL — ABNORMAL HIGH (ref 4.0–10.5)

## 2014-11-09 LAB — URINALYSIS, ROUTINE W REFLEX MICROSCOPIC
Bilirubin Urine: NEGATIVE
Glucose, UA: NEGATIVE mg/dL
HGB URINE DIPSTICK: NEGATIVE
KETONES UR: NEGATIVE mg/dL
Nitrite: NEGATIVE
Protein, ur: NEGATIVE mg/dL
SPECIFIC GRAVITY, URINE: 1.009 (ref 1.005–1.030)
Urobilinogen, UA: 0.2 mg/dL (ref 0.0–1.0)
pH: 6.5 (ref 5.0–8.0)

## 2014-11-09 LAB — I-STAT TROPONIN, ED: Troponin i, poc: 0.02 ng/mL (ref 0.00–0.08)

## 2014-11-09 LAB — BASIC METABOLIC PANEL
ANION GAP: 6 (ref 5–15)
BUN: 14 mg/dL (ref 6–23)
CHLORIDE: 98 meq/L (ref 96–112)
CO2: 30 mmol/L (ref 19–32)
Calcium: 8.8 mg/dL (ref 8.4–10.5)
Creatinine, Ser: 1.01 mg/dL (ref 0.50–1.35)
GFR calc Af Amer: 87 mL/min — ABNORMAL LOW (ref >90)
GFR calc non Af Amer: 75 mL/min — ABNORMAL LOW (ref >90)
GLUCOSE: 119 mg/dL — AB (ref 70–99)
Potassium: 4 mmol/L (ref 3.5–5.1)
Sodium: 134 mmol/L — ABNORMAL LOW (ref 135–145)

## 2014-11-09 LAB — URINE MICROSCOPIC-ADD ON

## 2014-11-09 LAB — PROTIME-INR
INR: 2.72 — ABNORMAL HIGH (ref 0.00–1.49)
Prothrombin Time: 29 seconds — ABNORMAL HIGH (ref 11.6–15.2)

## 2014-11-09 LAB — BRAIN NATRIURETIC PEPTIDE: B NATRIURETIC PEPTIDE 5: 351.8 pg/mL — AB (ref 0.0–100.0)

## 2014-11-09 NOTE — ED Provider Notes (Signed)
I saw and evaluated the patient, reviewed the resident's note and I agree with the findings and plan.   EKG Interpretation   Date/Time:  Thursday November 09 2014 20:52:38 EST Ventricular Rate:  84 PR Interval:    QRS Duration: 88 QT Interval:  398 QTC Calculation: 470 R Axis:   61 Text Interpretation:  Atrial fibrillation LVH with secondary  repolarization abnormality Anterior ST elevation, probably due to LVH  unchanged compared to prior  T wave inversions in lateral leads No  significant change since Nov 2015 Confirmed by [place],  DO, Cereniti Curb [phone])  on 11/09/2014 10:46:33 PM      Pt is a 69 y.o. male with history of severe CHF with life vest, atrial fibrillation who presents emergency department with several days of feeling poorly, generalized weakness and fatigue. States he felt two warning shocks from his LifeVest today but no actual defibrillation. No chest pain or shortness of breath.  Patient's EKG shows no new ischemic changes. Troponin negative. INR therapeutic. BNP mildly elevated at 351. Chest x-ray clear. Discuss with cardiology who recommends medical admission. They will see the patient in consult.  [place], DO 11/09/14 2351

## 2014-11-09 NOTE — ED Notes (Signed)
PER EMS pt picked up from [employer] with complaints of CP, nausea, lightheadedness and SOB about an hour ago. Pt states about 30 minutes ago he was shocked twice by his external defibrillator. Afib on monitor, HR: 90-120. Denies pain and nausea currently. EMS adm 324 asa and 1 nitro.

## 2014-11-09 NOTE — ED Provider Notes (Signed)
CSN: [id number]     Arrival date & time 11/09/14  2045 History   First MD Initiated Contact with Patient 11/09/14 2055     Chief Complaint  Patient presents with  . AICD Problem     (Consider location/radiation/quality/duration/timing/severity/associated sxs/prior Treatment) HPI 69 year old male with past history of COPD, history of prior EtOH and cocaine abuse, HCV, colon cancer, persistent atrial fibrillation, chronic systolic CHF who presents ED for nausea and near syncope following taking evening medications around 7:00. Patient has had similar sxs ever since seeing cardiology last week where he was started on several new medications. Prior to sxs he had dinner and was o/w in normal state of health.   Patient states following this he received 2 warning shocks from his life vest. Patient states he grabbed the life vest box and squeezed the 2 buttons as he was instructed to do and it never discharged a true defibrillation. He denies any chest pain during this time. His shortness of breath is reported to be chronic and unchanged currently. He has had no recent fevers, cough but patient does note he is recently admitted for pneumonia and has recovered well since then. Patient states he is currently staying at the shelter. He states he has had no other symptoms during this time. He is at his baseline he does have occasional chest pain but does not report any currently.  Past Medical History  Diagnosis Date  . A-fib   . Hypertension   . CHF (congestive heart failure)   . COPD (chronic obstructive pulmonary disease)   . Hepatitis C   . Tobacco abuse   . Adenocarcinoma of colon     STAGE 1  . Dysrhythmia     hx of atrial fibrilation   Past Surgical History  Procedure Laterality Date  . Cardioversion    . Colonoscopy    . Left and right heart catheterization with coronary angiogram N/A 10/23/2014    Procedure: LEFT AND RIGHT HEART CATHETERIZATION WITH CORONARY ANGIOGRAM;  Surgeon: Larey Dresser, MD;  Location: [facility] CATH LAB;  Service: Cardiovascular;  Laterality: N/A;   Family History  Problem Relation Age of Onset  . Hypertension Mother    History  Substance Use Topics  . Smoking status: Former Smoker    Quit date: 09/01/2014  . Smokeless tobacco: Never Used  . Alcohol Use: 0.0 oz/week    0 Not specified per week     Comment: " i quit drinking alcohol "    Review of Systems  Constitutional: Negative for fever, activity change and appetite change.  HENT: Negative for congestion, rhinorrhea and sore throat.   Eyes: Negative for visual disturbance.  Respiratory: Negative for cough and shortness of breath.   Cardiovascular: Negative for chest pain, palpitations and leg swelling.  Gastrointestinal: Positive for nausea. Negative for vomiting, abdominal pain and diarrhea.  Genitourinary: Negative for dysuria, flank pain, decreased urine volume and difficulty urinating.  Musculoskeletal: Negative for back pain and neck pain.  Skin: Negative for rash.  Neurological: Positive for syncope (near). Negative for dizziness, speech difficulty, weakness, light-headedness, numbness and headaches.  Psychiatric/Behavioral: Negative for confusion.      Allergies  Review of patient's allergies indicates no known allergies.  Home Medications   Prior to Admission medications   Medication Sig Start Date End Date Taking? Authorizing Provider  albuterol-ipratropium (COMBIVENT) 18-103 MCG/ACT inhaler Inhale 1 puff into the lungs every 4 (four) hours as needed for wheezing or shortness of breath. 09/12/14  Olam Idler, MD  amiodarone (PACERONE) 200 MG tablet Take 1 tablet (200 mg total) by mouth 2 (two) times daily. 10/17/14   Larey Dresser, MD  atorvastatin (LIPITOR) 20 MG tablet Take 1 tablet (20 mg total) by mouth daily. 11/03/14   Larey Dresser, MD  carvedilol (COREG) 6.25 MG tablet Take 2 tablets (12.5 mg total) by mouth 2 (two) times daily with a meal. 10/28/14   [place] N  Rumley, DO  digoxin (LANOXIN) 0.125 MG tablet Take 1 tablet (0.125 mg total) by mouth daily. 11/03/14   Larey Dresser, MD  doxycycline (ADOXA) 100 MG tablet Take 100 mg by mouth 2 (two) times daily.    Historical Provider, MD  furosemide (LASIX) 20 MG tablet Take 40 mg by mouth daily.     Historical Provider, MD  lisinopril (PRINIVIL,ZESTRIL) 5 MG tablet Take 1 tablet (5 mg total) by mouth daily. 11/03/14   Larey Dresser, MD  mometasone-formoterol [facility]) 200-5 MCG/ACT AERO Inhale 2 puffs into the lungs 2 (two) times daily. Patient taking differently: Inhale 2 puffs into the lungs daily as needed for wheezing.  09/12/14   Olam Idler, MD  predniSONE (DELTASONE) 20 MG tablet Take 40 mg by mouth daily with breakfast.    Historical Provider, MD  rivaroxaban (XARELTO) 20 MG TABS tablet Take 1 tablet (20 mg total) by mouth daily with supper. 10/17/14   Larey Dresser, MD  senna (SENOKOT) 8.6 MG TABS tablet Take 1 tablet (8.6 mg total) by mouth daily. 10/28/14   [place] N Rumley, DO  spironolactone (ALDACTONE) 25 MG tablet Take 0.5 tablets (12.5 mg total) by mouth daily. 10/17/14   Larey Dresser, MD  tiotropium (SPIRIVA) 18 MCG inhalation capsule Place 18 mcg into inhaler and inhale daily.    Historical Provider, MD   BP 121/98 mmHg  Pulse 88  Temp(Src) 98.3 F (36.8 C) (Oral)  Resp 18  Ht 6' (1.829 m)  Wt 183 lb (83.008 kg)  BMI 24.81 kg/m2  SpO2 96% Physical Exam  Constitutional: He is oriented to person, place, and time. He appears well-developed and well-nourished. No distress.  HENT:  Head: Normocephalic and atraumatic.  Nose: Nose normal.  Mouth/Throat: Oropharynx is clear and moist. No oropharyngeal exudate.  Eyes: Conjunctivae and EOM are normal.  Neck: Normal range of motion. Neck supple. No JVD present.  Cardiovascular: Normal rate, regular rhythm, normal heart sounds and intact distal pulses.   Pt is wearing a life vest.   Pulmonary/Chest: Effort normal and breath sounds  normal. No respiratory distress.  Abdominal: Soft. He exhibits no distension. There is no tenderness. There is no rebound and no guarding.  Musculoskeletal: Normal range of motion.  Neurological: He is alert and oriented to person, place, and time. No cranial nerve deficit.  Skin: Skin is warm and dry. No rash noted.  Psychiatric: He has a normal mood and affect.    ED Course  Procedures (including critical care time) Labs Review Labs Reviewed  CBC  BASIC METABOLIC PANEL  BRAIN NATRIURETIC PEPTIDE  PROTIME-INR  URINALYSIS, ROUTINE W REFLEX MICROSCOPIC  I-STAT [place], ED    Imaging Review Dg Chest 2 View  11/09/2014   CLINICAL DATA:  Left-sided chest pain.  Shortness of breath.  EXAM: CHEST  2 VIEW  COMPARISON:  CT chest, abdomen and pelvis 10/10/2014. Single view of the chest 10/13/2014.  FINDINGS: There is cardiomegaly without edema. Lungs are clear. No pneumothorax or pleural effusion. Remote posttraumatic change right  shoulder is noted.  IMPRESSION: No acute disease.   Electronically Signed   By: Inge Rise M.D.   On: 11/09/2014 21:30     EKG Interpretation   Date/Time:  Thursday November 09 2014 20:52:38 EST Ventricular Rate:  84 PR Interval:    QRS Duration: 88 QT Interval:  398 QTC Calculation: 470 R Axis:   61 Text Interpretation:  Atrial fibrillation LVH with secondary  repolarization abnormality Anterior ST elevation, probably due to LVH  unchanged compared to prior  T wave inversions in lateral leads No  significant change since Nov 2015 Confirmed by [place],  DO, KRISTEN [phone])  on 11/09/2014 10:46:33 PM      MDM   Final diagnoses:  Chest pain   QUENTIN SHOREY is a 69 y.o. male with H&P as above. The patient is hemodynamically stable and in no apparent distress on arrival. Cardiology note from 11/03/14 shows PMH: 1. Chronic systolic CHF: Cardiomyopathy with LV EF 15% and moderate RV dysfunction. 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 HIV - NR. He says that he has quit using cocaine and drinking ETOH since 10/15. Probably NYHA class III. He is not volume overloaded on exam. Continue Lasix 40 mg daily.  - Continue Coreg 12.5 mg bid - Start back on digoxin 0.125 mg daily and lisinopril 5 mg daily, not sure why they were stopped. - Continue spironolactone 12.5 mg daily - BMET in 10 days.  - If he can stay off cocaine and ETOH, he will be an ICD candidate. Repeat echo in 5/15, continue Lifevest until then given episode of syncope in setting of low EF.  - If he avoids substance abuse and follows up regularly, he may be a candidate for advanced therapies in the future.  2. Atrial fibrillation: Persistent. He has had cardioversions in the past. Given MR/TR and LA dilation, maintenance of NSR will be difficult but think it would be reasonable to try once with antiarrhythmic use. Continue amiodarone 200 mg bid. Back on Xarelto. Will need to follow LFTs closely on Xarelto. Given Xarelto use, he can stop ASA.  - Will see him back in 3 wks and set up DCCV attempt.  3. HCV: LFTs normal on most recent check. Follow closely with amiodarone.  4. Syncope: Recent episode may have been vagal, associated with defecation.    Patient comes in for near syncope and nausea following evening meds. On review of records patient patient is recently been evaluated by cardiology in on 1/15 and was restarted restarted on digoxin, statin, lisinopril.  Records also indicate the patient has known history of vasovagal syncope/defecation syncope. Symptoms tonight began after defecating this evening. Given sxs onset related to restarting meds, will check screening labs, EKG, CXR and consult cards. EKG shows afib with rate 94 and LVH unchanged from prior. CXR and labs unremarkable. BNP mildly elevated and pt euvolemic.  Life vest appears functional. Discussed case with cardiology who thinks given pt extensive cardiac hx, he would benefit  from admission and he will provide recs to admitting service.  Pt seen in conjunction with Dr. Delice Bison Ward, DO  Kirstie Peri, [place] [facility] Resident - PGY-2     Kirstie Peri, MD 11/09/14 [phone]

## 2014-11-09 NOTE — H&P (Signed)
[facility] Admission History and Physical Service Pager: [phone]  Patient name: Paul Clayton Medical record number: [id number] Date of birth: [birth year] Age: 69 y.o. Gender: male  Primary Care Provider: [name], MD Consultants: Cardiology Code Status: Full  Chief Complaint: near syncope  Assessment and Plan: Paul Clayton is a 69 y.o. male presenting with episode of near syncope and warning shocks from lifevest. PMH is significant for CHF (EF 15%), COPD, hepatitis C, afib. Recently hospitalized 10/27/14 for syncope and dyspnea.   # Near syncope: has hx of syncopal episodes including recent workup/hospitalization. No LOC, did receive warning shock with life vest. Significant cardiac history (see below). Lab workup unremarkable, CXR appears clear, EKG unchanged compared to prior, CBC and BMP largely wnl. DDx: likely etiology cardiogenic given feelings of heart flutter and life vest almost activating (able to interrogate lifevest?), also neurogenic vs tia, orthostatic (prior history of this 3 weeks ago). - admit to telemetry - appreciate cardiology recs - hold fluids - repeat EKG in AM  # CHF: EF 15% 09/16/14. Followed by HF clinic. Does not appear to be in acute exacerbation, not fluid overloaded by exam. Wt +7lbs from discharge weight, but down 8lbs from HF clinic visit. BNP 352.  - continue home lasix 40mg  daily - continue home lisinopril 5mg  - continue home digoxin - get dig level  # Afib: has underwent multiple cardioversions in the past. Cardiology was recommending another cardioversion while on the amiodarone, this was tentatively planned for sometime in the next 3 weeks.  - continue xarelto - continue amiodarone 200mg  BIDi  # HTN: - continue home coreg, lisinopril, spironolactone  # COPD: recently treated for exacerbation with steroids and doxycycline - tessalon, guafenesin-dextromethorphan for cough - continue home dulera and  spiriva  FEN/GI: diet heart healthy Prophylaxis: on xarelto  Disposition: admit to tele  History of Present Illness: Paul Clayton is a 69 y.o. male presenting with near syncope. Pt states around 7pm shortly after taking his medications he experienced feelings of lightheadedness, heart fluttering, and couldn't catch his breath. He sat down and received warning shocks from his life vest, but did not get full shocks as he says he never lost consciousness and was able to deactivate the vest. He then came to the emergency room. He denies chest pains. He only complains otherwise of persistent night time cough productive of sputum.   Admitted 10/27/14 to 10/28/14 for syncope, seen by cardiology and given life vest on discharge. Followed up on 1/15. At this visit he was "restarted" on digoxin 0.125mg  daily and lisinopril 5mg  daily (of note he was discharge with dig 0.25mg  and lisinopril). Planned on another cardioversion attempt while on amiodarone in 3 weeks.   Review Of Systems: Per HPI with the following additions: none Otherwise 12 point review of systems was performed and was unremarkable.  Patient Active Problem List   Diagnosis Date Noted  . Near syncope 11/09/2014  . Hx of substance abuse 10/30/2014  . SOB (shortness of breath)   . Acute decompensated heart failure   . Systolic CHF, chronic   . Atrial fibrillation with rapid ventricular response   . Faintness   . Chronic systolic heart failure   . Persistent atrial fibrillation   . Chronic systolic CHF (congestive heart failure) 10/17/2014  . Homeless single person   . Atrial fibrillation with RVR 10/13/2014  . High risk social situation 09/30/2014  . Colon cancer 09/26/2014  . Dyspnea 09/22/2014  . Chest pain  09/16/2014  . Syncope   . COPD exacerbation   . HFrEF (heart failure with reduced ejection fraction) 09/12/2014  . A-fib 09/12/2014  . COPD (chronic obstructive pulmonary disease) 09/12/2014   Past Medical History: Past  Medical History  Diagnosis Date  . A-fib   . Hypertension   . CHF (congestive heart failure)   . COPD (chronic obstructive pulmonary disease)   . Hepatitis C   . Tobacco abuse   . Adenocarcinoma of colon     STAGE 1  . Dysrhythmia     hx of atrial fibrilation   Past Surgical History: Past Surgical History  Procedure Laterality Date  . Cardioversion    . Colonoscopy    . Left and right heart catheterization with coronary angiogram N/A 10/23/2014    Procedure: LEFT AND RIGHT HEART CATHETERIZATION WITH CORONARY ANGIOGRAM;  Surgeon: Larey Dresser, MD;  Location: [facility] CATH LAB;  Service: Cardiovascular;  Laterality: N/A;   Social History: History  Substance Use Topics  . Smoking status: Former Smoker    Quit date: 09/01/2014  . Smokeless tobacco: Never Used  . Alcohol Use: 0.0 oz/week    0 Not specified per week     Comment: " i quit drinking alcohol "   Additional social history: denies any recent illicit drug use (says last time used was September/October 2015), denies recent alcohol use. Please also refer to relevant sections of EMR.  Family History: Family History  Problem Relation Age of Onset  . Hypertension Mother    Allergies and Medications: No Known Allergies No current facility-administered medications on file prior to encounter.   Current Outpatient Prescriptions on File Prior to Encounter  Medication Sig Dispense Refill  . albuterol-ipratropium (COMBIVENT) 18-103 MCG/ACT inhaler Inhale 1 puff into the lungs every 4 (four) hours as needed for wheezing or shortness of breath. 1 Inhaler 1  . amiodarone (PACERONE) 200 MG tablet Take 1 tablet (200 mg total) by mouth 2 (two) times daily. 60 tablet 3  . atorvastatin (LIPITOR) 20 MG tablet Take 1 tablet (20 mg total) by mouth daily. 30 tablet 3  . carvedilol (COREG) 6.25 MG tablet Take 2 tablets (12.5 mg total) by mouth 2 (two) times daily with a meal. 180 tablet 2  . digoxin (LANOXIN) 0.125 MG tablet Take 1 tablet  (0.125 mg total) by mouth daily. 30 tablet 3  . furosemide (LASIX) 20 MG tablet Take 40 mg by mouth daily.     Marland Kitchen lisinopril (PRINIVIL,ZESTRIL) 5 MG tablet Take 1 tablet (5 mg total) by mouth daily. 30 tablet 3  . mometasone-formoterol (DULERA) 200-5 MCG/ACT AERO Inhale 2 puffs into the lungs 2 (two) times daily. (Patient taking differently: Inhale 2 puffs into the lungs daily as needed for wheezing. ) 1 Inhaler 0  . predniSONE (DELTASONE) 20 MG tablet Take 40 mg by mouth daily with breakfast.    . rivaroxaban (XARELTO) 20 MG TABS tablet Take 1 tablet (20 mg total) by mouth daily with supper. 30 tablet 3  . senna (SENOKOT) 8.6 MG TABS tablet Take 1 tablet (8.6 mg total) by mouth daily. 30 each 0  . spironolactone (ALDACTONE) 25 MG tablet Take 0.5 tablets (12.5 mg total) by mouth daily. 15 tablet 3  . tiotropium (SPIRIVA) 18 MCG inhalation capsule Place 18 mcg into inhaler and inhale daily.      Objective: BP 100/71 mmHg  Pulse 76  Temp(Src) 98.3 F (36.8 C) (Oral)  Resp 19  Ht 6' (1.829 m)  Wt 183  lb (83.008 kg)  BMI 24.81 kg/m2  SpO2 94% Exam: General: NAD HEENT: PERRL, EOMI, MMM, otherwise negative Cardiovascular: irregularly irregular, F7C9, systolic murmur present, 2+ radial, PT, DP pulses bilaterally Respiratory: clear bilaterally, no appreciable w/r/c Abdomen: soft, notnender, nondistended, no organomegaly, normal bowel sounds. Some edema noted presacral area. Extremities: trace edema feet bilaterally, no edema in shins Skin: no rashes Neuro: alert and oriented, no focal deficits  Labs and Imaging: CBC BMET   Recent Labs Lab 11/09/14 2134  WBC 12.0*  HGB 12.9*  HCT 39.3  PLT 196    Recent Labs Lab 11/09/14 2134  NA 134*  K 4.0  CL 98  CO2 30  BUN 14  CREATININE 1.01  GLUCOSE 119*  CALCIUM 8.8     Dg Chest 2 View  11/09/2014   CLINICAL DATA:  Left-sided chest pain.  Shortness of breath.  EXAM: CHEST  2 VIEW  COMPARISON:  CT chest, abdomen and pelvis  10/10/2014. Single view of the chest 10/13/2014.  FINDINGS: There is cardiomegaly without edema. Lungs are clear. No pneumothorax or pleural effusion. Remote posttraumatic change right shoulder is noted.  IMPRESSION: No acute disease.   Electronically Signed   By: Inge Rise M.D.   On: 11/09/2014 21:30    Leone Brand, MD 11/09/2014, 11:53 PM PGY-2, [place] Intern pager: [phone], text pages welcome

## 2014-11-10 ENCOUNTER — Encounter (HOSPITAL_COMMUNITY): Payer: Self-pay | Admitting: Nurse Practitioner

## 2014-11-10 DIAGNOSIS — R0602 Shortness of breath: Secondary | ICD-10-CM

## 2014-11-10 DIAGNOSIS — I5020 Unspecified systolic (congestive) heart failure: Secondary | ICD-10-CM | POA: Diagnosis not present

## 2014-11-10 DIAGNOSIS — I481 Persistent atrial fibrillation: Secondary | ICD-10-CM | POA: Diagnosis not present

## 2014-11-10 DIAGNOSIS — I509 Heart failure, unspecified: Secondary | ICD-10-CM

## 2014-11-10 DIAGNOSIS — I428 Other cardiomyopathies: Secondary | ICD-10-CM | POA: Diagnosis not present

## 2014-11-10 DIAGNOSIS — I5023 Acute on chronic systolic (congestive) heart failure: Secondary | ICD-10-CM | POA: Diagnosis not present

## 2014-11-10 DIAGNOSIS — J441 Chronic obstructive pulmonary disease with (acute) exacerbation: Secondary | ICD-10-CM | POA: Diagnosis not present

## 2014-11-10 DIAGNOSIS — I4891 Unspecified atrial fibrillation: Secondary | ICD-10-CM | POA: Diagnosis not present

## 2014-11-10 DIAGNOSIS — I081 Rheumatic disorders of both mitral and tricuspid valves: Secondary | ICD-10-CM | POA: Diagnosis not present

## 2014-11-10 DIAGNOSIS — R55 Syncope and collapse: Secondary | ICD-10-CM | POA: Diagnosis not present

## 2014-11-10 LAB — DIGOXIN LEVEL: DIGOXIN LVL: 0.6 ng/mL — AB (ref 0.8–2.0)

## 2014-11-10 MED ORDER — ACETAMINOPHEN 325 MG PO TABS: 650.0000 mg | ORAL_TABLET | ORAL | Status: DC | PRN

## 2014-11-10 MED ORDER — BENZONATATE 100 MG PO CAPS
100.0000 mg | ORAL_CAPSULE | Freq: Three times a day (TID) | ORAL | Status: DC | PRN
  Administered 2014-11-10: 100 mg via ORAL
  Filled 2014-11-10 (×2): qty 1

## 2014-11-10 MED ORDER — FUROSEMIDE 40 MG PO TABS
40.0000 mg | ORAL_TABLET | Freq: Every day | ORAL | Status: DC
  Administered 2014-11-10 – 2014-11-11 (×2): 40 mg via ORAL
  Filled 2014-11-10 (×2): qty 1

## 2014-11-10 MED ORDER — TIOTROPIUM BROMIDE MONOHYDRATE 18 MCG IN CAPS
18.0000 ug | ORAL_CAPSULE | Freq: Every day | RESPIRATORY_TRACT | Status: DC
Start: 2014-11-10 — End: 2014-11-11
  Administered 2014-11-10 – 2014-11-11 (×2): 18 ug via RESPIRATORY_TRACT
  Filled 2014-11-10: qty 5

## 2014-11-10 MED ORDER — SPIRONOLACTONE 12.5 MG HALF TABLET
12.5000 mg | ORAL_TABLET | Freq: Every day | ORAL | Status: DC
  Administered 2014-11-10 – 2014-11-11 (×2): 12.5 mg via ORAL
  Filled 2014-11-10 (×2): qty 1

## 2014-11-10 MED ORDER — CARVEDILOL 12.5 MG PO TABS
12.5000 mg | ORAL_TABLET | Freq: Two times a day (BID) | ORAL | Status: DC
  Administered 2014-11-10 – 2014-11-11 (×4): 12.5 mg via ORAL
  Filled 2014-11-10 (×5): qty 1

## 2014-11-10 MED ORDER — DIGOXIN 125 MCG PO TABS
0.1250 mg | ORAL_TABLET | Freq: Every day | ORAL | Status: DC
Start: 2014-11-10 — End: 2014-11-11
  Administered 2014-11-10 – 2014-11-11 (×2): 0.125 mg via ORAL
  Filled 2014-11-10 (×2): qty 1

## 2014-11-10 MED ORDER — SENNA 8.6 MG PO TABS
1.0000 | ORAL_TABLET | Freq: Every day | ORAL | Status: DC
  Administered 2014-11-10 – 2014-11-11 (×2): 8.6 mg via ORAL
  Filled 2014-11-10 (×2): qty 1

## 2014-11-10 MED ORDER — AMIODARONE HCL 200 MG PO TABS
200.0000 mg | ORAL_TABLET | Freq: Two times a day (BID) | ORAL | Status: DC
  Administered 2014-11-10 (×2): 200 mg via ORAL
  Filled 2014-11-10 (×3): qty 1

## 2014-11-10 MED ORDER — ONDANSETRON HCL 4 MG/2ML IJ SOLN: 4.0000 mg | Freq: Four times a day (QID) | INTRAMUSCULAR | Status: DC | PRN

## 2014-11-10 MED ORDER — RIVAROXABAN 20 MG PO TABS
20.0000 mg | ORAL_TABLET | Freq: Every day | ORAL | Status: DC
  Administered 2014-11-10 – 2014-11-11 (×2): 20 mg via ORAL
  Filled 2014-11-10 (×2): qty 1

## 2014-11-10 MED ORDER — DM-GUAIFENESIN ER 30-600 MG PO TB12
1.0000 | ORAL_TABLET | Freq: Two times a day (BID) | ORAL | Status: DC
  Administered 2014-11-10 – 2014-11-11 (×4): 1 via ORAL
  Filled 2014-11-10 (×5): qty 1

## 2014-11-10 MED ORDER — ATORVASTATIN CALCIUM 20 MG PO TABS
20.0000 mg | ORAL_TABLET | Freq: Every day | ORAL | Status: DC
  Administered 2014-11-10 – 2014-11-11 (×2): 20 mg via ORAL
  Filled 2014-11-10 (×2): qty 1

## 2014-11-10 MED ORDER — AMIODARONE HCL 200 MG PO TABS
200.0000 mg | ORAL_TABLET | Freq: Every day | ORAL | Status: DC
  Administered 2014-11-11: 200 mg via ORAL
  Filled 2014-11-10: qty 1

## 2014-11-10 MED ORDER — CARVEDILOL 12.5 MG PO TABS
12.5000 mg | ORAL_TABLET | Freq: Two times a day (BID) | ORAL | Status: DC
  Filled 2014-11-10 (×3): qty 1

## 2014-11-10 MED ORDER — PREDNISONE 20 MG PO TABS
40.0000 mg | ORAL_TABLET | Freq: Every day | ORAL | Status: DC
  Filled 2014-11-10 (×2): qty 2

## 2014-11-10 MED ORDER — FUROSEMIDE 10 MG/ML IJ SOLN
60.0000 mg | Freq: Once | INTRAMUSCULAR | Status: AC
  Administered 2014-11-10: 60 mg via INTRAVENOUS
  Filled 2014-11-10: qty 6

## 2014-11-10 MED ORDER — IPRATROPIUM-ALBUTEROL 0.5-2.5 (3) MG/3ML IN SOLN
3.0000 mL | RESPIRATORY_TRACT | Status: DC | PRN
  Administered 2014-11-10: 3 mL via RESPIRATORY_TRACT
  Filled 2014-11-10: qty 3

## 2014-11-10 MED ORDER — LISINOPRIL 5 MG PO TABS
5.0000 mg | ORAL_TABLET | Freq: Every day | ORAL | Status: DC
  Administered 2014-11-10: 5 mg via ORAL
  Filled 2014-11-10 (×2): qty 1

## 2014-11-10 MED ORDER — PREDNISONE 20 MG PO TABS
40.0000 mg | ORAL_TABLET | Freq: Every day | ORAL | Status: DC
  Administered 2014-11-10 – 2014-11-11 (×2): 40 mg via ORAL
  Filled 2014-11-10 (×3): qty 2

## 2014-11-10 NOTE — Progress Notes (Signed)
Family Practice Teaching Service Interval Progress Note  Family medicine team spoke with cardiology team earlier today, who stated that cardiology is primary service and family medicine does not need to follow at this time. We will continue to follow socially as this is our primary patient. Please contact us if we can be of assistance.  Chrisandra Netters, MD Family Medicine PGY-3 Service Pager [phone]

## 2014-11-10 NOTE — Discharge Instructions (Signed)

## 2014-11-10 NOTE — Progress Notes (Signed)
Patient complaining of shortness of breath.  Oxygen saturation 100% on room air.  Nebulizer given as ordered.  Patient states breathing treatment helped.  Will continue to monitor.

## 2014-11-10 NOTE — Progress Notes (Signed)
Educated patient multiple times by night shift nurse and day shift nurse about not drinking a lot of fluid.  Patient still requesting juice and sprite frequently.

## 2014-11-10 NOTE — Care Management Note (Unsigned)
    Page 1 of 1   11/10/2014     1:54:48 PM CARE MANAGEMENT NOTE 11/10/2014  Patient:  Paul Clayton, Paul Clayton   Account Number:  [id number]  Date Initiated:  11/10/2014  Documentation initiated by:  Oluchi Pucci  Subjective/Objective Assessment:   Pt adm with near syncope, CHF.  PTA, pt resided at [facility].     Action/Plan:   CSW consulted for transportation needs at dc.  Will follow progress.   Anticipated DC Date:  11/11/2014   Anticipated DC Plan:  [place]  CM consult      Choice offered to / List presented to:             Status of service:  In process, will continue to follow Medicare Important Message given?   (If response is "NO", the following Medicare IM given date fields will be blank) Date Medicare IM given:   Medicare IM given by:   Date Additional Medicare IM given:   Additional Medicare IM given by:    Discharge Disposition:    Per UR Regulation:  Reviewed for med. necessity/level of care/duration of stay  If discussed at [place] of Stay Meetings, dates discussed:    Comments:

## 2014-11-10 NOTE — Progress Notes (Addendum)
   Dr. Keturah Shavers consult note from earlier this am reviewed.   Patient reports presynope after taking meds. This is recurrent problem. SBP here 90-100. Noted alarms on LifeVest but interrogation of vest shows no arrhythmias. Remains in AF. On amio.  Also c/o severe cough at night. Being treated for COPD flare.  Weight is up about 15 pounds over past month but volume status looks ok on exam.   Recs:  1) Check orthostatics 2) Decrease amio to 200 daily 3) If BP remains low may need to hold lisinopril.  4) Would limit steroids if possible.   Sabino Denning,MD 1:25 PM

## 2014-11-10 NOTE — ED Notes (Signed)
Report attempted 

## 2014-11-10 NOTE — H&P (Signed)
Physician History and Physical    Paul Clayton MRN: [id number] DOB/AGE: Mar 19, 1946 69 y.o. Admit date: 11/09/2014  Primary Cardiologist:  Dr. Aundra Dubin  CC:  SOB and pre-syncope with Shock warning from lifeVest.   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 He states he takes medications regularly and earlier today, right after taking his meds, he felt sick and almost passed out. He said the LifeVest had 2x shock warning and he was able to cancel them before firing. He complain of increase fluid retention in his abdomen too where he store his fluid.   He denied other symptoms such as chest pain, no orthopnea, no syncope.   Review of systems: A review of 10 organ systems was done and is negative except as stated above in HPI  Past Medical History  Diagnosis Date  . A-fib   . Hypertension   . CHF (congestive heart failure)   . COPD (chronic obstructive pulmonary disease)   . Hepatitis C   . Tobacco abuse   . Adenocarcinoma of colon     STAGE 1  . Dysrhythmia     hx of atrial fibrilation   Past Surgical History  Procedure Laterality Date  . Cardioversion    . Colonoscopy    . Left and right heart catheterization with coronary angiogram N/A 10/23/2014    Procedure: LEFT AND RIGHT HEART CATHETERIZATION WITH CORONARY ANGIOGRAM;  Surgeon: Larey Dresser, MD;  Location: [facility] CATH LAB;  Service: Cardiovascular;  Laterality: N/A;   History   Social History  . Marital Status: Widowed    Spouse Name: N/A    Number of Children: N/A  . Years of Education: N/A   Occupational History  . Not on file.   Social History Main Topics  . Smoking status: Former Smoker    Quit date: 09/01/2014  . Smokeless tobacco: Never Used  . Alcohol Use: 0.0 oz/week      0 Not specified per week     Comment: " i quit drinking alcohol "  . Drug Use: Yes    Special: Cocaine     Comment: hx of substance of abuse;denies use x 1 month  . Sexual Activity: Not Currently    Birth Control/ Protection: Abstinence   Other Topics Concern  . Not on file   Social History Narrative    Family History  Problem Relation Age of Onset  . Hypertension Mother      No Known Allergies   (Not in a hospital admission)  No current facility-administered medications for this encounter.   Current Outpatient Prescriptions  Medication Sig Dispense Refill  . albuterol-ipratropium (COMBIVENT) 18-103 MCG/ACT inhaler Inhale 1 puff into the lungs every 4 (four) hours as needed for wheezing or shortness of breath. 1 Inhaler 1  . amiodarone (PACERONE) 200 MG tablet Take 1 tablet (200 mg total) by mouth 2 (two) times daily. 60 tablet 3  . atorvastatin (LIPITOR) 20 MG tablet Take 1 tablet (20 mg total) by mouth daily. 30 tablet 3  . carvedilol (COREG) 6.25 MG tablet Take 2 tablets (12.5 mg total) by mouth 2 (two) times daily with a meal. 180 tablet 2  . digoxin (LANOXIN) 0.125 MG tablet Take 1 tablet (0.125 mg total) by mouth daily. 30 tablet  3  . furosemide (LASIX) 20 MG tablet Take 40 mg by mouth daily.     Marland Kitchen lisinopril (PRINIVIL,ZESTRIL) 5 MG tablet Take 1 tablet (5 mg total) by mouth daily. 30 tablet 3  . mometasone-formoterol (DULERA) 200-5 MCG/ACT AERO Inhale 2 puffs into the lungs 2 (two) times daily. (Patient taking differently: Inhale 2 puffs into the lungs daily as needed for wheezing. ) 1 Inhaler 0  . predniSONE (DELTASONE) 20 MG tablet Take 40 mg by mouth daily with breakfast.    . rivaroxaban (XARELTO) 20 MG TABS tablet Take 1 tablet (20 mg total) by mouth daily with supper. 30 tablet 3  . senna (SENOKOT) 8.6 MG TABS tablet Take 1 tablet (8.6 mg total) by mouth daily. 30 each 0  . spironolactone (ALDACTONE) 25 MG tablet Take 0.5 tablets (12.5 mg total) by mouth daily.  15 tablet 3  . tiotropium (SPIRIVA) 18 MCG inhalation capsule Place 18 mcg into inhaler and inhale daily.      Physical Exam: Blood pressure 100/71, pulse 76, temperature 98.3 F (36.8 C), temperature source Oral, resp. rate 19, height 6' (1.829 m), weight 83.008 kg (183 lb), SpO2 94 %.; Body mass index is 24.81 kg/(m^2). Temp:  [98.3 F (36.8 C)] 98.3 F (36.8 C) (01/21 2052) Pulse Rate:  [76-88] 76 (01/21 2318) Resp:  [18-19] 19 (01/21 2318) BP: (90-121)/(58-98) 100/71 mmHg (01/21 2318) SpO2:  [94 %-96 %] 94 % (01/21 2318) Weight:  [83.008 kg (183 lb)] 83.008 kg (183 lb) (01/21 2052)  No intake or output data in the 24 hours ending 11/10/14 0015 General: NAD Heent: MMM Neck: JVP 18 at 30 degree.  CV: Nondisplaced PMI.  irregular, nl S1/S2, no S3/S4, no murmur. No carotid bruit   Lungs: Clear to auscultation bilaterally with normal respiratory effort Abdomen: Soft, nontender, nondistended Extremities: No clubbing or cyanosis.  Normal pedal pulses. No pedal edema Skin: Intact without lesions or rashes  Neurologic: Alert and oriented x 3, grossly nonfocal  Psych: Normal mood and affect    Labs: No results for input(s): CKTOTAL, CKMB, TROPONINI in the last 72 hours. Lab Results  Component Value Date   WBC 12.0* 11/09/2014   HGB 12.9* 11/09/2014   HCT 39.3 11/09/2014   MCV 91.2 11/09/2014   PLT 196 11/09/2014    Recent Labs Lab 11/09/14 2134  NA 134*  K 4.0  CL 98  CO2 30  BUN 14  CREATININE 1.01  CALCIUM 8.8  GLUCOSE 119*   Lab Results  Component Value Date   CHOL 137 09/16/2014   HDL 43 09/16/2014   LDLCALC 73 09/16/2014   TRIG 103 09/16/2014     EKG:  Afib with lateral ST depression, cannot rule out dig toxicity Radiology:  Dg Chest 2 View  11/09/2014   CLINICAL DATA:  Left-sided chest pain.  Shortness of breath.  EXAM: CHEST  2 VIEW  COMPARISON:  CT chest, abdomen and pelvis 10/10/2014. Single view of the chest 10/13/2014.  FINDINGS: There is cardiomegaly  without edema. Lungs are clear. No pneumothorax or pleural effusion. Remote posttraumatic change right shoulder is noted.  IMPRESSION: No acute disease.   Electronically Signed   By: Inge Rise M.D.   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 Systolic HF exacerbation 2.  Rule out dig toxicity, Re: ECG finding 3. LifeVest Warning with presyncope, rule out arrhythmia 4. Afib on NOAC  PLAN:  1. Telemetry observe overnight, will contact [facility] to send lifeVest transmission 2. Monitor arrhythmia in house, if VT/VF, increase Amiodarone 3. Check Dig level 4. IV diuresis, will give Lasix 80 mg x1, may need re-dose in AM, goal negative 1.5 L/24 hrs. He takes 40 mg PO BID, he accumulates fluid in abdomen.  5. Continue evidence base HF medications, BB, ACEI, Aldactone, Statin.  6. He is warm and fluid up, I am not concerned about low output state yet but needs IV diuresis.  7. If responding to diuresis well, no events on tele, and LifeVest recording show no malignant arrhythmia, OK to d/c tomorrow and outpt follow up.   Signed: Manus Gunning, MD Cardiology Fellow 11/10/2014, 12:15 AM

## 2014-11-11 ENCOUNTER — Observation Stay (HOSPITAL_COMMUNITY): Payer: Medicare Other

## 2014-11-11 DIAGNOSIS — R05 Cough: Secondary | ICD-10-CM | POA: Diagnosis not present

## 2014-11-11 DIAGNOSIS — R0602 Shortness of breath: Secondary | ICD-10-CM | POA: Diagnosis not present

## 2014-11-11 DIAGNOSIS — I081 Rheumatic disorders of both mitral and tricuspid valves: Secondary | ICD-10-CM | POA: Diagnosis not present

## 2014-11-11 DIAGNOSIS — J441 Chronic obstructive pulmonary disease with (acute) exacerbation: Secondary | ICD-10-CM | POA: Diagnosis not present

## 2014-11-11 DIAGNOSIS — I5023 Acute on chronic systolic (congestive) heart failure: Secondary | ICD-10-CM | POA: Diagnosis not present

## 2014-11-11 DIAGNOSIS — R55 Syncope and collapse: Principal | ICD-10-CM

## 2014-11-11 DIAGNOSIS — I481 Persistent atrial fibrillation: Secondary | ICD-10-CM | POA: Diagnosis not present

## 2014-11-11 DIAGNOSIS — I428 Other cardiomyopathies: Secondary | ICD-10-CM | POA: Diagnosis not present

## 2014-11-11 LAB — CBC
HCT: 39.6 % (ref 39.0–52.0)
Hemoglobin: 13.2 g/dL (ref 13.0–17.0)
MCH: 30.1 pg (ref 26.0–34.0)
MCHC: 33.3 g/dL (ref 30.0–36.0)
MCV: 90.2 fL (ref 78.0–100.0)
Platelets: 211 10*3/uL (ref 150–400)
RBC: 4.39 MIL/uL (ref 4.22–5.81)
RDW: 14.1 % (ref 11.5–15.5)
WBC: 14.4 10*3/uL — AB (ref 4.0–10.5)

## 2014-11-11 LAB — BASIC METABOLIC PANEL
Anion gap: 6 (ref 5–15)
BUN: 14 mg/dL (ref 6–23)
CALCIUM: 8.8 mg/dL (ref 8.4–10.5)
CHLORIDE: 101 mmol/L (ref 96–112)
CO2: 28 mmol/L (ref 19–32)
Creatinine, Ser: 0.89 mg/dL (ref 0.50–1.35)
GFR calc Af Amer: >90 mL/min (ref >90)
GFR, EST NON AFRICAN AMERICAN: 87 mL/min — AB (ref >90)
Glucose, Bld: 117 mg/dL — ABNORMAL HIGH (ref 70–99)
Potassium: 4.5 mmol/L (ref 3.5–5.1)
Sodium: 135 mmol/L (ref 135–145)

## 2014-11-11 MED ORDER — CARVEDILOL 6.25 MG PO TABS: 6.2500 mg | ORAL_TABLET | Freq: Two times a day (BID) | ORAL | Status: DC

## 2014-11-11 MED ORDER — PREDNISONE 20 MG PO TABS: 40.0000 mg | ORAL_TABLET | Freq: Every day | ORAL | Status: DC

## 2014-11-11 MED ORDER — LOSARTAN POTASSIUM 25 MG PO TABS: 12.5000 mg | ORAL_TABLET | Freq: Every day | ORAL | Status: DC

## 2014-11-11 MED ORDER — LOSARTAN POTASSIUM 25 MG PO TABS
12.5000 mg | ORAL_TABLET | Freq: Every day | ORAL | Status: DC
  Administered 2014-11-11: 12.5 mg via ORAL
  Filled 2014-11-11 (×2): qty 0.5

## 2014-11-11 MED ORDER — GUAIFENESIN-DM 100-10 MG/5ML PO SYRP: 5.0000 mL | ORAL_SOLUTION | ORAL | Status: DC | PRN

## 2014-11-11 MED ORDER — LISINOPRIL 2.5 MG PO TABS: 2.5000 mg | ORAL_TABLET | Freq: Every day | ORAL | Status: DC
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
## 2014-11-11 NOTE — Progress Notes (Signed)
Referral received for medication assistance. Pt has Medicaid that will cover his meds. [name] RN CCM Case Mgmt phone [phone]

## 2014-11-11 NOTE — Discharge Summary (Signed)
Physician Discharge Summary     Cardiology: [facility] Patient ID: Paul Clayton MRN: [id number] DOB/AGE: Apr 12, 1946 70 y.o.  Admit date: 11/09/2014 Discharge date: 11/11/2014  Admission Diagnoses:   Near syncope, Acute on chronic systolic CHF  Discharge Diagnoses:  Active Problems:   Near syncope   Acute on chronic systolic CHF   Atrial fibrillation, Persistent   COPD      Discharged Condition: stable  Hospital Course:    69 yo with history of COPD, ETOH and cocaine abuse, HCV, colon cancer, persistent atrial fibrillation, and chronic systolic CHF with EF 29-93%, severe TR/MR and RV dysfunction, no CAD on recent cath who presented with increased SOB and one episode of pre-syncope earlier in the day.  He stated he takes medications regularly and earlier in the day, right after taking his meds, he felt sick and almost passed out. He said the LifeVest had 2x shock warning and he was able to cancel them before firing. He complain of increase fluid retention in his abdomen too where he store his fluid. He denied other symptoms such as chest pain, no orthopnea, no syncope.   He was admitted and given IV lasix 80 mg x1.  He was continued on BB, ACEI, Aldactone, Statin.   Interrogation of life vest shows no arrhythmias. Remains in AF. On amio which was decreased to 200mg  daily but then increased back to BID.  Also c/o severe cough at night. Being treated for COPD flare.  Orthostatic vitals were checked and negative.  Net fluids were  -2.9L.  Lisinopril was stopped because of the cough and he was started on Losartan 12.5mg  daily.  Prior to admission it was planned to do a DCCV once loaded on amiodarone.  Robitussin prescribed for cough.  The patient was seen by Dr. Aundra Clayton who felt he was stable for DC home.   Consults: None  Significant Diagnostic Studies:  PORTABLE CHEST - 1 VIEW  COMPARISON: 11/09/2014  FINDINGS: Cardiac shadow is again mildly enlarged. The lungs are well  aerated bilaterally. No focal infiltrate or sizable effusion is seen. No bony abnormality is noted.  IMPRESSION: No acute abnormality seen.  Treatments: See above  Discharge Exam: Blood pressure 109/93, pulse 76, temperature 98.2 F (36.8 C), temperature source Oral, resp. rate 16, height 6' (1.829 m), weight 182 lb 12.8 oz (82.918 kg), SpO2 97 %.   Disposition: 01-Home or Self Care      Discharge Instructions    Diet - low sodium heart healthy    Complete by:  As directed      Discharge instructions    Complete by:  As directed   Monitor your weight every morning.  If you gain 3 pounds in 24 hours, or 5 pounds in a week, call the office for instructions.     Increase activity slowly    Complete by:  As directed             Medication List    STOP taking these medications        lisinopril 5 MG tablet  Commonly known as:  PRINIVIL,ZESTRIL     mometasone-formoterol 200-5 MCG/ACT Aero  Commonly known as:  DULERA      TAKE these medications        albuterol-ipratropium 18-103 MCG/ACT inhaler  Commonly known as:  COMBIVENT  Inhale 1 puff into the lungs every 4 (four) hours as needed for wheezing or shortness of breath.     amiodarone 200 MG tablet  Commonly known  as:  PACERONE  Take 1 tablet (200 mg total) by mouth 2 (two) times daily.     atorvastatin 20 MG tablet  Commonly known as:  LIPITOR  Take 1 tablet (20 mg total) by mouth daily.     carvedilol 6.25 MG tablet  Commonly known as:  COREG  Take 1 tablet (6.25 mg total) by mouth 2 (two) times daily with a meal.     digoxin 0.125 MG tablet  Commonly known as:  LANOXIN  Take 1 tablet (0.125 mg total) by mouth daily.     furosemide 20 MG tablet  Commonly known as:  LASIX  Take 40 mg by mouth daily.     guaiFENesin-dextromethorphan 100-10 MG/5ML syrup  Commonly known as:  ROBITUSSIN DM  Take 5 mLs by mouth every 4 (four) hours as needed for cough.     losartan 25 MG tablet  Commonly known as:   COZAAR  Take 0.5 tablets (12.5 mg total) by mouth daily.     predniSONE 20 MG tablet  Commonly known as:  DELTASONE  Take 2 tablets (40 mg total) by mouth daily with breakfast. Last dose Wednesday, Jan 27.     rivaroxaban 20 MG Tabs tablet  Commonly known as:  XARELTO  Take 1 tablet (20 mg total) by mouth daily with supper.     senna 8.6 MG Tabs tablet  Commonly known as:  SENOKOT  Take 1 tablet (8.6 mg total) by mouth daily.     spironolactone 25 MG tablet  Commonly known as:  ALDACTONE  Take 0.5 tablets (12.5 mg total) by mouth daily.     tiotropium 18 MCG inhalation capsule  Commonly known as:  SPIRIVA  Place 18 mcg into inhaler and inhale daily.       Follow-up Information    Follow up with Paul Champagne, MD.   Specialty:  Cardiology   Why:  The office will call you with the follow up appt date and time.    Contact information:   [address] [place] [address] [phone]      Greater than 30 minutes was spent completing the patient's discharge.    SignedTarri Clayton, [place] 11/11/2014, 11:03 AM

## 2014-11-12 DIAGNOSIS — R079 Chest pain, unspecified: Secondary | ICD-10-CM | POA: Diagnosis not present

## 2014-11-12 NOTE — Progress Notes (Signed)
Patient alert oriented, no c/o of pain, Mild shortness of breath ,patient said that is his baseline. V/S stable, iv d/c, d/c instruction given, patient verbalized understand.Encourage patient to pick his meds from pharmacy as soon as possible. Patient d/c home via cab.

## 2014-11-13 ENCOUNTER — Emergency Department (HOSPITAL_COMMUNITY)
Admission: EM | Admit: 2014-11-13 | Discharge: 2014-11-13 | Disposition: A | Discharge status: Home / Self Care | Payer: Medicare Other | Attending: Emergency Medicine | Admitting: Emergency Medicine

## 2014-11-13 ENCOUNTER — Encounter (HOSPITAL_COMMUNITY): Payer: Self-pay | Admitting: Emergency Medicine

## 2014-11-13 DIAGNOSIS — R42 Dizziness and giddiness: Secondary | ICD-10-CM | POA: Insufficient documentation

## 2014-11-13 DIAGNOSIS — J441 Chronic obstructive pulmonary disease with (acute) exacerbation: Secondary | ICD-10-CM | POA: Diagnosis not present

## 2014-11-13 DIAGNOSIS — I509 Heart failure, unspecified: Secondary | ICD-10-CM | POA: Insufficient documentation

## 2014-11-13 DIAGNOSIS — Z79899 Other long term (current) drug therapy: Secondary | ICD-10-CM | POA: Insufficient documentation

## 2014-11-13 DIAGNOSIS — I10 Essential (primary) hypertension: Secondary | ICD-10-CM | POA: Diagnosis not present

## 2014-11-13 DIAGNOSIS — Z9861 Coronary angioplasty status: Secondary | ICD-10-CM | POA: Insufficient documentation

## 2014-11-13 DIAGNOSIS — R079 Chest pain, unspecified: Secondary | ICD-10-CM | POA: Diagnosis not present

## 2014-11-13 DIAGNOSIS — I4891 Unspecified atrial fibrillation: Secondary | ICD-10-CM | POA: Diagnosis not present

## 2014-11-13 DIAGNOSIS — Z87891 Personal history of nicotine dependence: Secondary | ICD-10-CM | POA: Diagnosis not present

## 2014-11-13 DIAGNOSIS — Z85038 Personal history of other malignant neoplasm of large intestine: Secondary | ICD-10-CM | POA: Insufficient documentation

## 2014-11-13 LAB — TROPONIN I
Troponin I: 0.03 ng/mL (ref <0.031)
Troponin I: 0.03 ng/mL (ref <0.031)

## 2014-11-13 LAB — CBC WITH DIFFERENTIAL/PLATELET
BASOS ABS: 0 10*3/uL (ref 0.0–0.1)
Basophils Relative: 0 % (ref 0–1)
EOS ABS: 0.2 10*3/uL (ref 0.0–0.7)
EOS PCT: 2 % (ref 0–5)
HCT: 41.5 % (ref 39.0–52.0)
HEMOGLOBIN: 13.4 g/dL (ref 13.0–17.0)
Lymphocytes Relative: 26 % (ref 12–46)
Lymphs Abs: 2.5 10*3/uL (ref 0.7–4.0)
MCH: 30 pg (ref 26.0–34.0)
MCHC: 32.3 g/dL (ref 30.0–36.0)
MCV: 92.8 fL (ref 78.0–100.0)
MONO ABS: 0.9 10*3/uL (ref 0.1–1.0)
MONOS PCT: 9 % (ref 3–12)
Neutro Abs: 6.1 10*3/uL (ref 1.7–7.7)
Neutrophils Relative %: 63 % (ref 43–77)
Platelets: 194 10*3/uL (ref 150–400)
RBC: 4.47 MIL/uL (ref 4.22–5.81)
RDW: 14.5 % (ref 11.5–15.5)
WBC: 9.6 10*3/uL (ref 4.0–10.5)

## 2014-11-13 LAB — BRAIN NATRIURETIC PEPTIDE: B NATRIURETIC PEPTIDE 5: 255 pg/mL — AB (ref 0.0–100.0)

## 2014-11-13 LAB — BASIC METABOLIC PANEL
ANION GAP: 4 — AB (ref 5–15)
BUN: 24 mg/dL — AB (ref 6–23)
CO2: 30 mmol/L (ref 19–32)
CREATININE: 1.11 mg/dL (ref 0.50–1.35)
Calcium: 8.5 mg/dL (ref 8.4–10.5)
Chloride: 101 mmol/L (ref 96–112)
GFR calc non Af Amer: 67 mL/min — ABNORMAL LOW (ref >90)
GFR, EST AFRICAN AMERICAN: 77 mL/min — AB (ref >90)
Glucose, Bld: 104 mg/dL — ABNORMAL HIGH (ref 70–99)
Potassium: 4.3 mmol/L (ref 3.5–5.1)
Sodium: 135 mmol/L (ref 135–145)

## 2014-11-13 MED ORDER — GI COCKTAIL ~~LOC~~
30.0000 mL | Freq: Once | ORAL | Status: AC
  Administered 2014-11-13: 30 mL via ORAL
  Filled 2014-11-13: qty 30

## 2014-11-13 MED ORDER — NITROGLYCERIN 0.4 MG SL SUBL
SUBLINGUAL_TABLET | SUBLINGUAL | Status: AC
Start: 2014-11-13 — End: 2014-11-13
  Administered 2014-11-13: 0.4 mg
  Filled 2014-11-13: qty 1

## 2014-11-13 NOTE — Discharge Instructions (Signed)
Return to the ER for worsening condition or new concerning symptoms.   Chest Pain Observation It is often hard to give a specific diagnosis for the cause of chest pain. Among other possibilities your symptoms might be caused by inadequate oxygen delivery to your heart (angina). Angina that is not treated or evaluated can lead to a heart attack (myocardial infarction) or death. Blood tests, electrocardiograms, and X-rays may have been done to help determine a possible cause of your chest pain. After evaluation and observation, your health care provider has determined that it is unlikely your pain was caused by an unstable condition that requires hospitalization. However, a full evaluation of your pain may need to be completed, with additional diagnostic testing as directed. It is very important to keep your follow-up appointments. Not keeping your follow-up appointments could result in permanent heart damage, disability, or death. If there is any problem keeping your follow-up appointments, you must call your health care provider. HOME CARE INSTRUCTIONS  Due to the slight chance that your pain could be angina, it is important to follow your health care provider's treatment plan and also maintain a healthy lifestyle:  Maintain or work toward achieving a healthy weight.  Stay physically active and exercise regularly.  Decrease your salt intake.  Eat a balanced, healthy diet. Talk to a dietitian to learn about heart-healthy foods.  Increase your fiber intake by including whole grains, vegetables, fruits, and nuts in your diet.  Avoid situations that cause stress, anger, or depression.  Take medicines as advised by your health care provider. Report any side effects to your health care provider. Do not stop medicines or adjust the dosages on your own.  Quit smoking. Do not use nicotine patches or gum until you check with your health care provider.  Keep your blood pressure, blood sugar, and  cholesterol levels within normal limits.  Limit alcohol intake to no more than 1 drink per day for women who are not pregnant and 2 drinks per day for men.  Do not abuse drugs. SEEK IMMEDIATE MEDICAL CARE IF: You have severe chest pain or pressure which may include symptoms such as:  You feel pain or pressure in your arms, neck, jaw, or back.  You have severe back or abdominal pain, feel sick to your stomach (nauseous), or throw up (vomit).  You are sweating profusely.  You are having a fast or irregular heartbeat.  You feel short of breath while at rest.  You notice increasing shortness of breath during rest, sleep, or with activity.  You have chest pain that does not get better after rest or after taking your usual medicine.  You wake from sleep with chest pain.  You are unable to sleep because you cannot breathe.  You develop a frequent cough or you are coughing up blood.  You feel dizzy, faint, or experience extreme fatigue.  You develop severe weakness, dizziness, fainting, or chills. Any of these symptoms may represent a serious problem that is an emergency. Do not wait to see if the symptoms will go away. Call your local emergency services (911 in the [place] Do not drive yourself to the hospital. MAKE SURE YOU:  Understand these instructions.  Will watch your condition.  Will get help right away if you are not doing well or get worse. Document Released: 11/08/2010 Document Revised: 10/11/2013 Document Reviewed: 04/07/2013 [facility] Patient Information 2015 [place], [place]. This information is not intended to replace advice given to you by your health care provider. Make  sure you discuss any questions you have with your health care provider.  Pain of Unknown Etiology (Pain Without a Known Cause) You have come to your caregiver because of pain. Pain can occur in any part of the body. Often there is not a definite cause. If your laboratory (blood or urine) work was  normal and X-rays or other studies were normal, your caregiver may treat you without knowing the cause of the pain. An example of this is the headache. Most headaches are diagnosed by taking a history. This means your caregiver asks you questions about your headaches. Your caregiver determines a treatment based on your answers. Usually testing done for headaches is normal. Often testing is not done unless there is no response to medications. Regardless of where your pain is located today, you can be given medications to make you comfortable. If no physical cause of pain can be found, most cases of pain will gradually leave as suddenly as they came.  If you have a painful condition and no reason can be found for the pain, it is important that you follow up with your caregiver. If the pain becomes worse or does not go away, it may be necessary to repeat tests and look further for a possible cause.  Only take over-the-counter or prescription medicines for pain, discomfort, or fever as directed by your caregiver.  For the protection of your privacy, test results cannot be given over the phone. Make sure you receive the results of your test. Ask how these results are to be obtained if you have not been informed. It is your responsibility to obtain your test results.  You may continue all activities unless the activities cause more pain. When the pain lessens, it is important to gradually resume normal activities. Resume activities by beginning slowly and gradually increasing the intensity and duration of the activities or exercise. During periods of severe pain, bed rest may be helpful. Lie or sit in any position that is comfortable.  Ice used for acute (sudden) conditions may be effective. Use a large plastic bag filled with ice and wrapped in a towel. This may provide pain relief.  See your caregiver for continued problems. Your caregiver can help or refer you for exercises or physical therapy if  necessary. If you were given medications for your condition, do not drive, operate machinery or power tools, or sign legal documents for 24 hours. Do not drink alcohol, take sleeping pills, or take other medications that may interfere with treatment. See your caregiver immediately if you have pain that is becoming worse and not relieved by medications. Document Released: 07/01/2001 Document Revised: 07/27/2013 Document Reviewed: 10/06/2005 [facility] Patient Information 2015 [place], [place]. This information is not intended to replace advice given to you by your health care provider. Make sure you discuss any questions you have with your health care provider.

## 2014-11-13 NOTE — ED Notes (Signed)
Pt. Left with all belongings 

## 2014-11-13 NOTE — ED Provider Notes (Signed)
CSN: [id number]     Arrival date & time 11/13/14  0013 History  This chart was scribed for Kalman Drape, MD by Chester Holstein, ED Scribe. This patient was seen in room A08C/A08C and the patient's care was started at 12:26 AM.    Chief Complaint  Patient presents with  . Chest Pain    The history is provided by the patient. No language interpreter was used.    HPI Comments: Paul Clayton is a 69 y.o. male with PMHx of Afib, HTN, CHF, and COPD who presents to the Emergency Department complaining of waxing and waning chest pain with onset around 10 PM this evening around 20 minutes. Pt notes he took his medication this evening but took it a little later than usual.  He notes he went to relieve his bowels and began experiencing associated SOB, light headedness. Pt states his medication was changed 2 weeks ago and he has been experiencing lightheadedness since then. He is unsure of the exact name. Pt notes mild pain now. Pt was recently seen in ED and discharged 2 days ago. Pt states he has not had chest pain that felt like this before.  Pt notes he had chest pain before. He notes he has quit smoking and stopped EtOH use since September 2015. Pt is on Lasix and blood thinners. Pt denies leg swelling, nausea and diaphoresis.  Past Medical History  Diagnosis Date  . A-fib   . Hypertension   . CHF (congestive heart failure)   . COPD (chronic obstructive pulmonary disease)   . Hepatitis C   . Tobacco abuse   . Adenocarcinoma of colon     STAGE 1  . Dysrhythmia     hx of atrial fibrilation   Past Surgical History  Procedure Laterality Date  . Cardioversion    . Colonoscopy    . Left and right heart catheterization with coronary angiogram N/A 10/23/2014    Procedure: LEFT AND RIGHT HEART CATHETERIZATION WITH CORONARY ANGIOGRAM;  Surgeon: Larey Dresser, MD;  Location: [facility] CATH LAB;  Service: Cardiovascular;  Laterality: N/A;   Family History  Problem Relation Age of Onset  . Hypertension  Mother    History  Substance Use Topics  . Smoking status: Former Smoker    Quit date: 09/01/2014  . Smokeless tobacco: Never Used  . Alcohol Use: 0.0 oz/week    0 Not specified per week     Comment: " i quit drinking alcohol "    Review of Systems  Constitutional: Negative for diaphoresis.  Respiratory: Positive for shortness of breath.   Cardiovascular: Positive for chest pain. Negative for leg swelling.  Gastrointestinal: Negative for nausea.  Neurological: Positive for light-headedness.      Allergies  Review of patient's allergies indicates no known allergies.  Home Medications   Prior to Admission medications   Medication Sig Start Date End Date Taking? Authorizing Provider  albuterol-ipratropium (COMBIVENT) 18-103 MCG/ACT inhaler Inhale 1 puff into the lungs every 4 (four) hours as needed for wheezing or shortness of breath. 09/12/14   Olam Idler, MD  amiodarone (PACERONE) 200 MG tablet Take 1 tablet (200 mg total) by mouth 2 (two) times daily. 10/17/14   Larey Dresser, MD  atorvastatin (LIPITOR) 20 MG tablet Take 1 tablet (20 mg total) by mouth daily. 11/03/14   Larey Dresser, MD  carvedilol (COREG) 6.25 MG tablet Take 1 tablet (6.25 mg total) by mouth 2 (two) times daily with a meal. 11/11/14  Brett Canales, PA-C  digoxin (LANOXIN) 0.125 MG tablet Take 1 tablet (0.125 mg total) by mouth daily. 11/03/14   Larey Dresser, MD  furosemide (LASIX) 20 MG tablet Take 40 mg by mouth daily.     Historical Provider, MD  guaiFENesin-dextromethorphan (ROBITUSSIN DM) 100-10 MG/5ML syrup Take 5 mLs by mouth every 4 (four) hours as needed for cough. 11/11/14   Brett Canales, PA-C  losartan (COZAAR) 25 MG tablet Take 0.5 tablets (12.5 mg total) by mouth daily. 11/11/14   Brett Canales, PA-C  predniSONE (DELTASONE) 20 MG tablet Take 2 tablets (40 mg total) by mouth daily with breakfast. Last dose Wednesday, Jan 27. 11/11/14   Brett Canales, PA-C  rivaroxaban (XARELTO) 20 MG TABS  tablet Take 1 tablet (20 mg total) by mouth daily with supper. 10/17/14   Larey Dresser, MD  senna (SENOKOT) 8.6 MG TABS tablet Take 1 tablet (8.6 mg total) by mouth daily. 10/28/14   [place] N Rumley, DO  spironolactone (ALDACTONE) 25 MG tablet Take 0.5 tablets (12.5 mg total) by mouth daily. 10/17/14   Larey Dresser, MD  tiotropium (SPIRIVA) 18 MCG inhalation capsule Place 18 mcg into inhaler and inhale daily.    Historical Provider, MD   BP 134/109 mmHg  Pulse 85  Temp(Src) 98.2 F (36.8 C) (Oral)  Resp 22  SpO2 99% Physical Exam  Constitutional: He is oriented to person, place, and time. He appears well-developed and well-nourished.  HENT:  Head: Normocephalic and atraumatic.  Nose: Nose normal.  Mouth/Throat: Oropharynx is clear and moist.  Eyes: Conjunctivae and EOM are normal. Pupils are equal, round, and reactive to light.  Neck: Normal range of motion. Neck supple. No JVD present. No tracheal deviation present. No thyromegaly present.  Cardiovascular: Normal rate, regular rhythm, normal heart sounds and intact distal pulses.  Exam reveals no gallop and no friction rub.   No murmur heard. Pulmonary/Chest: Effort normal and breath sounds normal. No stridor. No respiratory distress. He has no wheezes. He has no rales. He exhibits no tenderness.  Abdominal: Soft. Bowel sounds are normal. He exhibits no distension and no mass. There is no tenderness. There is no rebound and no guarding.  Musculoskeletal: Normal range of motion. He exhibits no edema or tenderness.  Lymphadenopathy:    He has no cervical adenopathy.  Neurological: He is alert and oriented to person, place, and time. He displays normal reflexes. He exhibits normal muscle tone. Coordination normal.  Skin: Skin is warm and dry. No rash noted. No erythema. No pallor.  Psychiatric: He has a normal mood and affect. His behavior is normal. Judgment and thought content normal.  Nursing note and vitals reviewed.   ED  Course  Procedures (including critical care time) DIAGNOSTIC STUDIES: Oxygen Saturation is 99% on room air, normal by my interpretation.    COORDINATION OF CARE: 12:34 AM Discussed treatment plan with patient at beside, the patient agrees with the plan and has no further questions at this time.   Labs Review Labs Reviewed - No data to display  Imaging Review Dg Chest Port 1 View  11/11/2014   CLINICAL DATA:  Shortness of breath and cough  EXAM: PORTABLE CHEST - 1 VIEW  COMPARISON:  11/09/2014  FINDINGS: Cardiac shadow is again mildly enlarged. The lungs are well aerated bilaterally. No focal infiltrate or sizable effusion is seen. No bony abnormality is noted.  IMPRESSION: No acute abnormality seen.   Electronically Signed   By: Elta Guadeloupe  Lukens M.D.   On: 11/11/2014 08:08     EKG Interpretation   Date/Time:  Clayton November 13 2014 00:20:31 EST Ventricular Rate:  88 PR Interval:    QRS Duration: 92 QT Interval:  372 QTC Calculation: 450 R Axis:   58 Text Interpretation:  Atrial fibrillation Probable LVH with secondary  repol abnrm No significant change since last tracing Confirmed by Miquel Stacks   MD, Jodi Criscuolo ([id number]) on 11/13/2014 2:58:10 AM      MDM   Final diagnoses:  Chest pain, unspecified chest pain type    I personally performed the services described in this documentation, which was scribed in my presence. The recorded information has been reviewed and is accurate.  Patient presents via EMS from shelter with complaint of chest pain that came on while having a BM.  Patient with recent heart catheterization with out significant stenosis.  EKG A. fib without ischemic changes.  Initial troponin is negative.  Plan for second troponin and will consult cardiology for any input.  Last heart cath:  Cardiac Catheterization Procedure Note  Name: Paul Clayton MRN: [id number] DOB: 1946-03-10  Procedure: Right Heart Cath, Left Heart Cath, Selective Coronary Angiography, LV  angiography  Indication: Cardiomyopathy, chronic systolic CHF  Procedural Details: The left brachial and radial areas were prepped, draped, and anesthetized with 1% lidocaine. There was a pre-existing IV in the left brachial area that was changed out for a 5 French venous sheath. A Swan-Ganz catheter was used for the right heart catheterization. Standard protocol was followed for recording of right heart pressures and sampling of oxygen saturations. Fick cardiac output was calculated. The left radial artery was accessed using modified Seldinger technique and a 6 French arterial sheath was placed. The patient received 3 mg IA verapamil and weight-based IV heparin. Standard Judkins catheters were used for selective coronary angiography and left ventriculography. There were no immediate procedural complications. The patient was transferred to the post catheterization recovery area for further monitoring.  Procedural Findings: Hemodynamics (mmHg) RA mean 9 RV 44/9 PA 44/26, mean 35 PCWP mean 21 LV 107/22 AO 108/82  Oxygen saturations: PA 63% AO 98%  Cardiac Output (Fick) 4.31  Cardiac Index (Fick) 2.1 PVR 3.2 WU  Coronary angiography: Coronary dominance: right  Left mainstem: 20% distal tapering.   Left anterior descending (LAD): 30% ostial LAD stenosis. Large high D1. Luminal irregularities.   Left circumflex (LCx): Small vessel with 30% ostial stenosis. Luminal irregularities.   Right coronary artery (RCA): 30% proximal RCA stenosis, luminal irregularities.  Left ventriculography: EF 20-25%, diffuse hypokinesis.  Final Conclusions: Nonischemic cardiomyopathy. Mildly elevated filling pressures. Low cardiac output but not markedly low. He can increase Lasix to 40 mg daily and will see him back in office in 2 wks.   Case discussed with Dr. Oran Rein on call for cardiology, who agrees with discharge home and follow-up with Dr. Algernon Huxley.  Patient recently seen by cardiology  and discharged yesterday.  Unclear source of chest pain, but does not appear to be cardiac in nature.  Kalman Drape, MD 11/13/14 [phone]

## 2014-11-13 NOTE — ED Notes (Signed)
Pt from home. Per EMS, pt c/o 10/10 non-radiating central CP starting q 1 hour ago with SOB. Pt also hypotensive at 91/57. Pt took BP medication before bedtime. Pt received 324 ASA and 500 mL of NS PTA. HR 77 O2 98%. RR 18. Hx of Afib. Pt wearing Life Vest.

## 2014-11-13 NOTE — ED Notes (Signed)
MD Otter at bedside. 

## 2014-11-14 ENCOUNTER — Telehealth: Payer: Self-pay | Admitting: Gastroenterology

## 2014-11-14 DIAGNOSIS — K639 Disease of intestine, unspecified: Secondary | ICD-10-CM | POA: Diagnosis not present

## 2014-11-14 NOTE — Telephone Encounter (Signed)
Paul Clayton and [name]:  Below I reviewed the material which I received from Paul Clayton at his office visit recently. I was awaiting colonoscopy reports with photographs but they never arrived despite several phone calls.  I agree with Paul Clayton's recent office visit suggesting repeat colonoscopy, to assess his colon again.  Given his significant comorbidities I was awaiting both of your suggestions on this before just proceeding with repeat colonoscopy.  Let me know what you think and I will contact him to schedule if all in agreement.  I will have my office fax copies of the reports to you.   Flexible sigmoidoscopy Dr. Prudencio Burly T, 08/22/2014. This was done for hematochezia and abnormal CT scan. 22 cm pedunculated polyps or identified in the descending colon they were left intact. A 5 cm nonobstructing colon mass was observed at 30 cm from the anal verge in the sigmoid colon. This was biopsied. The biopsy report was read as "moderately differentiated adenocarcinoma". In the comment section it stated "the lesion represents at least intramucosal adenocarcinoma. There is some focal fibroinflammatory stroma suggesting an invasive component".  Colonoscopy, same physician, 08/24/2014 was performed. Quality of the bowel per position was adequate. 8-10 polyps were removed from the colon ranging in size from 4 mm to 2 cm. These were throughout the colon. Some were removed with biopsy summary removed with hot snare. The large 5 cm mass seen on previous sigmoidoscopy was identified again it appeared to be a pedunculated polyp it was resected and retrieved "completely in piecemeal fashion using hot snare polypectomy" an Endo Clip was placed at the site for breathing for bleeding prophylaxis and the site was also injected with tattoo spot for future identification purposes. 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

## 2014-11-15 ENCOUNTER — Other Ambulatory Visit: Payer: Self-pay

## 2014-11-15 ENCOUNTER — Telehealth: Payer: Self-pay

## 2014-11-15 DIAGNOSIS — Z86010 Personal history of colonic polyps: Secondary | ICD-10-CM

## 2014-11-15 MED ORDER — MOVIPREP 100 G PO SOLR: 1.0000 | Freq: Once | ORAL | Status: DC

## 2014-11-15 NOTE — Telephone Encounter (Signed)
May hold Xarelto 48 hrs prior.      ----- Message -----     From: Barron Alvine, CMA     Sent: 11/15/2014  1:47 PM      To: Larey Dresser, MD    Pt aware to hold xarelto 48 hours prior to procedure

## 2014-11-15 NOTE — Telephone Encounter (Signed)
Colon scheduled, pt instructed and medications reviewed.  Patient instructions mailed to home.  Patient to call with any questions or concerns.  Letter sent to Dr Aundra Dubin regarding Xarelto

## 2014-11-15 NOTE — Telephone Encounter (Signed)
-----   Message from Milus Banister, MD sent at 11/14/2014  3:08 PM EST ----- Vickie Melnik, Can you call him.  I have communicated with Drs. Leone Haven and we agree that he needs repeat colonoscopy.  Given cardiac situation it will need to be at [facility], MAC sedation next available EUS thursday.  Need to contact his cardiologist about him holding his blood thinner xarelto for 1 day prior to the colonoscopy.  THanks

## 2014-11-20 ENCOUNTER — Other Ambulatory Visit: Payer: Self-pay | Admitting: Family Medicine

## 2014-11-20 DIAGNOSIS — J42 Unspecified chronic bronchitis: Secondary | ICD-10-CM

## 2014-11-20 MED ORDER — UMECLIDINIUM BROMIDE 62.5 MCG/INH IN AEPB: 1.0000 | INHALATION_SPRAY | Freq: Every day | RESPIRATORY_TRACT | Status: DC

## 2014-11-21 ENCOUNTER — Ambulatory Visit (HOSPITAL_COMMUNITY)
Admission: RE | Admit: 2014-11-21 | Discharge: 2014-11-21 | Disposition: A | Discharge status: Home / Self Care | Payer: Medicare Other | Source: Ambulatory Visit | Attending: Internal Medicine | Admitting: Internal Medicine

## 2014-11-21 ENCOUNTER — Encounter (HOSPITAL_COMMUNITY): Payer: Self-pay

## 2014-11-21 VITALS — BP 118/60 | HR 111 | Wt 188.8 lb

## 2014-11-21 DIAGNOSIS — B1920 Unspecified viral hepatitis C without hepatic coma: Secondary | ICD-10-CM | POA: Diagnosis not present

## 2014-11-21 DIAGNOSIS — Z87891 Personal history of nicotine dependence: Secondary | ICD-10-CM | POA: Insufficient documentation

## 2014-11-21 DIAGNOSIS — I509 Heart failure, unspecified: Secondary | ICD-10-CM

## 2014-11-21 DIAGNOSIS — J449 Chronic obstructive pulmonary disease, unspecified: Secondary | ICD-10-CM | POA: Diagnosis not present

## 2014-11-21 DIAGNOSIS — Z87898 Personal history of other specified conditions: Secondary | ICD-10-CM

## 2014-11-21 DIAGNOSIS — I481 Persistent atrial fibrillation: Secondary | ICD-10-CM | POA: Diagnosis not present

## 2014-11-21 DIAGNOSIS — R55 Syncope and collapse: Secondary | ICD-10-CM | POA: Insufficient documentation

## 2014-11-21 DIAGNOSIS — C189 Malignant neoplasm of colon, unspecified: Secondary | ICD-10-CM | POA: Diagnosis not present

## 2014-11-21 DIAGNOSIS — I5020 Unspecified systolic (congestive) heart failure: Secondary | ICD-10-CM

## 2014-11-21 DIAGNOSIS — I5022 Chronic systolic (congestive) heart failure: Secondary | ICD-10-CM | POA: Diagnosis not present

## 2014-11-21 DIAGNOSIS — F1911 Other psychoactive substance abuse, in remission: Secondary | ICD-10-CM

## 2014-11-21 DIAGNOSIS — Z7901 Long term (current) use of anticoagulants: Secondary | ICD-10-CM | POA: Insufficient documentation

## 2014-11-21 DIAGNOSIS — Z79899 Other long term (current) drug therapy: Secondary | ICD-10-CM | POA: Diagnosis not present

## 2014-11-21 DIAGNOSIS — J441 Chronic obstructive pulmonary disease with (acute) exacerbation: Secondary | ICD-10-CM | POA: Diagnosis not present

## 2014-11-21 DIAGNOSIS — I429 Cardiomyopathy, unspecified: Secondary | ICD-10-CM | POA: Diagnosis not present

## 2014-11-21 DIAGNOSIS — I482 Chronic atrial fibrillation: Secondary | ICD-10-CM | POA: Insufficient documentation

## 2014-11-21 DIAGNOSIS — I4819 Other persistent atrial fibrillation: Secondary | ICD-10-CM

## 2014-11-21 LAB — COMPREHENSIVE METABOLIC PANEL
ALT: 32 U/L (ref 0–53)
AST: 35 U/L (ref 0–37)
Albumin: 2.9 g/dL — ABNORMAL LOW (ref 3.5–5.2)
Alkaline Phosphatase: 51 U/L (ref 39–117)
Anion gap: 8 (ref 5–15)
BUN: 17 mg/dL (ref 6–23)
CHLORIDE: 107 mmol/L (ref 96–112)
CO2: 22 mmol/L (ref 19–32)
Calcium: 8.9 mg/dL (ref 8.4–10.5)
Creatinine, Ser: 0.98 mg/dL (ref 0.50–1.35)
GFR calc non Af Amer: 83 mL/min — ABNORMAL LOW (ref >90)
Glucose, Bld: 80 mg/dL (ref 70–99)
POTASSIUM: 4.4 mmol/L (ref 3.5–5.1)
Sodium: 137 mmol/L (ref 135–145)
Total Bilirubin: 0.6 mg/dL (ref 0.3–1.2)
Total Protein: 6.9 g/dL (ref 6.0–8.3)

## 2014-11-21 LAB — TSH: TSH: 1.232 u[IU]/mL (ref 0.350–4.500)

## 2014-11-21 MED ORDER — RIVAROXABAN 20 MG PO TABS: 20.0000 mg | ORAL_TABLET | Freq: Every day | ORAL | Status: DC

## 2014-11-21 MED ORDER — SPIRONOLACTONE 25 MG PO TABS: 12.5000 mg | ORAL_TABLET | Freq: Every day | ORAL | Status: DC

## 2014-11-21 MED ORDER — CARVEDILOL 6.25 MG PO TABS: 12.5000 mg | ORAL_TABLET | Freq: Two times a day (BID) | ORAL | Status: DC

## 2014-11-21 NOTE — Patient Instructions (Signed)
STOP LISINOPRIL  Start Spironolactone 12.5 mg (1/2 tab) daily  Take Xarelto until 11/28/14  Labs today  Labs in 2 weeks (bmet)  You have been referred to Pulmonary for your COPD  Your physician recommends that you schedule a follow-up appointment in: 1 month
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
## 2014-11-23 ENCOUNTER — Other Ambulatory Visit: Payer: Medicare Other

## 2014-11-27 ENCOUNTER — Encounter (HOSPITAL_COMMUNITY): Payer: Self-pay | Admitting: *Deleted

## 2014-11-27 ENCOUNTER — Encounter: Payer: Self-pay | Admitting: Internal Medicine

## 2014-11-27 ENCOUNTER — Ambulatory Visit (HOSPITAL_BASED_OUTPATIENT_CLINIC_OR_DEPARTMENT_OTHER): Payer: Medicare Other

## 2014-11-27 ENCOUNTER — Telehealth: Payer: Self-pay | Admitting: Internal Medicine

## 2014-11-27 ENCOUNTER — Ambulatory Visit (HOSPITAL_BASED_OUTPATIENT_CLINIC_OR_DEPARTMENT_OTHER): Payer: Medicare Other | Admitting: Internal Medicine

## 2014-11-27 VITALS — BP 105/68 | HR 69 | Temp 99.1°F | Resp 18 | Ht 72.0 in | Wt 183.8 lb

## 2014-11-27 DIAGNOSIS — C189 Malignant neoplasm of colon, unspecified: Secondary | ICD-10-CM

## 2014-11-27 LAB — COMPREHENSIVE METABOLIC PANEL (CC13)
ALBUMIN: 3.1 g/dL — AB (ref 3.5–5.0)
ALT: 37 U/L (ref 0–55)
AST: 33 U/L (ref 5–34)
Alkaline Phosphatase: 58 U/L (ref 40–150)
Anion Gap: 7 mEq/L (ref 3–11)
BUN: 15.1 mg/dL (ref 7.0–26.0)
CALCIUM: 9 mg/dL (ref 8.4–10.4)
CO2: 24 mEq/L (ref 22–29)
CREATININE: 1 mg/dL (ref 0.7–1.3)
Chloride: 108 mEq/L (ref 98–109)
EGFR: 89 mL/min/{1.73_m2} — ABNORMAL LOW (ref >90)
Glucose: 112 mg/dl (ref 70–140)
POTASSIUM: 4 meq/L (ref 3.5–5.1)
Sodium: 139 mEq/L (ref 136–145)
TOTAL PROTEIN: 7.3 g/dL (ref 6.4–8.3)
Total Bilirubin: 0.71 mg/dL (ref 0.20–1.20)

## 2014-11-27 LAB — CBC WITH DIFFERENTIAL/PLATELET
BASO%: 0.6 % (ref 0.0–2.0)
Basophils Absolute: 0 10*3/uL (ref 0.0–0.1)
EOS ABS: 0.3 10*3/uL (ref 0.0–0.5)
EOS%: 3.1 % (ref 0.0–7.0)
HCT: 40 % (ref 38.4–49.9)
HGB: 12.6 g/dL — ABNORMAL LOW (ref 13.0–17.1)
LYMPH%: 21.4 % (ref 14.0–49.0)
MCH: 29.4 pg (ref 27.2–33.4)
MCHC: 31.5 g/dL — ABNORMAL LOW (ref 32.0–36.0)
MCV: 93.3 fL (ref 79.3–98.0)
MONO#: 0.9 10*3/uL (ref 0.1–0.9)
MONO%: 11.3 % (ref 0.0–14.0)
NEUT#: 5.4 10*3/uL (ref 1.5–6.5)
NEUT%: 63.6 % (ref 39.0–75.0)
PLATELETS: 245 10*3/uL (ref 140–400)
RBC: 4.29 10*6/uL (ref 4.20–5.82)
RDW: 15.9 % — ABNORMAL HIGH (ref 11.0–14.6)
WBC: 8.4 10*3/uL (ref 4.0–10.3)
lymph#: 1.8 10*3/uL (ref 0.9–3.3)

## 2014-11-27 LAB — CEA: CEA: 2.9 ng/mL (ref 0.0–5.0)

## 2014-11-27 NOTE — Telephone Encounter (Signed)
pt called to r/s missed lab....done....pt aware of appt

## 2014-11-27 NOTE — Progress Notes (Signed)
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  CURRENT THERAPY: None  INTERVAL HISTORY: Paul Clayton 69 y.o. male returns to the clinic today for follow-up visit. The patient was seen recently by Dr. Ardis Hughs and Dr. Dalbert Batman. He is scheduled for repeat colonoscopy on 11/30/2014 by Dr. Ardis Hughs. He also had a PET scan performed recently. The patient is feeling fine today with no specific complaints except for fatigue. He denied having any significant weight loss or night sweats. He denied having any chest pain, shortness breath, cough or hemoptysis. He is here today for previously scheduled visit for evaluation of his condition and discussion of his imaging results.  MEDICAL HISTORY: Past Medical History  Diagnosis Date  . A-fib   . Hypertension   . CHF (congestive heart failure)   . COPD (chronic obstructive pulmonary disease)   . Hepatitis C   . Tobacco abuse   . Adenocarcinoma of colon     STAGE 1  . Dysrhythmia     hx of atrial fibrilation  . H/O ETOH abuse     ALLERGIES:  has No Known Allergies.  MEDICATIONS:  Current Outpatient Prescriptions  Medication Sig Dispense Refill  . albuterol-ipratropium (COMBIVENT) 18-103 MCG/ACT inhaler Inhale 1 puff into the lungs every 4 (four) hours as needed for wheezing or shortness of breath. 1 Inhaler 1  . amiodarone (PACERONE) 200 MG tablet Take 1 tablet (200 mg total) by mouth 2 (two) times daily. 60 tablet 3  . atorvastatin (LIPITOR) 20 MG tablet Take 1 tablet (20 mg total) by mouth daily. 30 tablet 3  . carvedilol (COREG) 6.25 MG tablet Take 2 tablets (12.5 mg total) by mouth 2 (two) times daily with a  meal. 60 tablet 5  . digoxin (LANOXIN) 0.125 MG tablet Take 1 tablet (0.125 mg total) by mouth daily. 30 tablet 3  . furosemide (LASIX) 20 MG tablet Take 40 mg by mouth daily.     Marland Kitchen guaiFENesin-dextromethorphan (ROBITUSSIN DM) 100-10 MG/5ML syrup Take 5 mLs by mouth every 4 (four) hours as needed for cough. 118 mL 0  . losartan (COZAAR) 25 MG tablet Take 0.5 tablets (12.5 mg total) by mouth daily. 30 tablet 5  . mometasone-formoterol (DULERA) 200-5 MCG/ACT AERO Inhale 2 puffs into the lungs 2 (two) times daily as needed for wheezing.    Marland Kitchen MOVIPREP 100 G SOLR Take 1 kit (200 g total) by mouth once. 1 kit 0  . rivaroxaban (XARELTO) 20 MG TABS tablet Take 1 tablet (20 mg total) by mouth daily with supper. 30 tablet 3  . senna (SENOKOT) 8.6 MG TABS tablet Take 1 tablet (8.6 mg total) by mouth daily. 30 each 0  . spironolactone (ALDACTONE) 25 MG tablet Take 0.5 tablets (12.5 mg total) by mouth daily. 15 tablet 3  . Umeclidinium Bromide (INCRUSE ELLIPTA) 62.5 MCG/INH AEPB Inhale 1 Inhaler into the lungs daily. 30 each 3   No current facility-administered medications for this visit.    SURGICAL HISTORY:  Past Surgical History  Procedure Laterality Date  . Cardioversion    . Colonoscopy    . Left and right heart catheterization with coronary angiogram N/A 10/23/2014    Procedure: LEFT AND  RIGHT HEART CATHETERIZATION WITH CORONARY ANGIOGRAM;  Surgeon: Larey Dresser, MD;  Location: [facility] CATH LAB;  Service: Cardiovascular;  Laterality: N/A;    REVIEW OF SYSTEMS:  Constitutional: negative Eyes: negative Ears, nose, mouth, throat, and face: negative Respiratory: negative Cardiovascular: negative Gastrointestinal: negative Genitourinary:negative Integument/breast: negative Hematologic/lymphatic: negative Musculoskeletal:negative Neurological: negative Behavioral/Psych: negative Endocrine: negative Allergic/Immunologic: negative   PHYSICAL EXAMINATION: General appearance: alert, cooperative and  no distress Head: Normocephalic, without obvious abnormality, atraumatic Neck: no adenopathy, no JVD, supple, symmetrical, trachea midline and thyroid not enlarged, symmetric, no tenderness/mass/nodules Lymph nodes: Cervical, supraclavicular, and axillary nodes normal. Resp: clear to auscultation bilaterally Back: symmetric, no curvature. ROM normal. No CVA tenderness. Cardio: regular rate and rhythm, S1, S2 normal, no murmur, click, rub or gallop GI: soft, non-tender; bowel sounds normal; no masses,  no organomegaly Extremities: extremities normal, atraumatic, no cyanosis or edema Neurologic: Alert and oriented X 3, normal strength and tone. Normal symmetric reflexes. Normal coordination and gait  ECOG PERFORMANCE STATUS: 1 - Symptomatic but completely ambulatory  There were no vitals taken for this visit.  LABORATORY DATA: Lab Results  Component Value Date   WBC 8.4 11/27/2014   HGB 12.6* 11/27/2014   HCT 40.0 11/27/2014   MCV 93.3 11/27/2014   PLT 245 11/27/2014      Chemistry      Component Value Date/Time   NA 137 11/21/2014 1200   NA 139 10/11/2014 0825   K 4.4 11/21/2014 1200   K 3.9 10/11/2014 0825   CL 107 11/21/2014 1200   CO2 22 11/21/2014 1200   CO2 25 10/11/2014 0825   BUN 17 11/21/2014 1200   BUN 11.6 10/11/2014 0825   CREATININE 0.98 11/21/2014 1200   CREATININE 0.9 10/11/2014 0825      Component Value Date/Time   CALCIUM 8.9 11/21/2014 1200   CALCIUM 8.8 10/11/2014 0825   ALKPHOS 51 11/21/2014 1200   ALKPHOS 67 10/11/2014 0825   AST 35 11/21/2014 1200   AST 29 10/11/2014 0825   ALT 32 11/21/2014 1200   ALT 37 10/11/2014 0825   BILITOT 0.6 11/21/2014 1200   BILITOT 0.84 10/11/2014 0825       RADIOGRAPHIC STUDIES: Dg Chest 2 View  11/09/2014   CLINICAL DATA:  Left-sided chest pain.  Shortness of breath.  EXAM: CHEST  2 VIEW  COMPARISON:  CT chest, abdomen and pelvis 10/10/2014. Single view of the chest 10/13/2014.  FINDINGS: There is cardiomegaly  without edema. Lungs are clear. No pneumothorax or pleural effusion. Remote posttraumatic change right shoulder is noted.  IMPRESSION: No acute disease.   Electronically Signed   By: Inge Rise M.D.   On: 11/09/2014 21:30   Nm Pet Image Initial (pi) Skull Base To Thigh  11/01/2014   CLINICAL DATA:  Initial treatment strategy for evaluation of bilateral pulmonary nodules. History of colon carcinoma.  EXAM: NUCLEAR MEDICINE PET SKULL BASE TO THIGH  TECHNIQUE: 9.1 mCi F-18 FDG was injected intravenously. Full-ring PET imaging was performed from the skull base to thigh after the radiotracer. CT data was obtained and used for attenuation correction and anatomic localization.  FASTING BLOOD GLUCOSE:  Value: 85 mg/dl  COMPARISON:  Chest abdomen and pelvic CTs of 10/10/2014. No prior PET. Most recent chest radiograph of 10/27/2014.  FINDINGS: NECK  No areas of abnormal hypermetabolism.  CHEST  Foci of low-level hypermetabolism corresponding to ground-glass opacities. An area of vague ground-glass opacity in the posterior right apex measures a S.U.V. max of 1.6 on image  10. This is new since the 10/10/14 exam.  More anterior right upper lobe subpleural ground-glass opacity measures 1.9 x 1.6 cm and a S.U.V. max of 1.2 on image 14. Slightly less well-defined than at 3.2 x 2.2 cm on the prior exam. No solid component identified.  Ground-glass opacity within the peribronchovascular superior segment right lower lobe corresponds to hypermetabolism on image 34. This measures a S.U.V. max of 3.0. No well-defined nodule in this area. No ground-glass opacity on the prior exam. Minimal nodularity identified within this area on the prior. Example image 32.  ABDOMEN/PELVIS  No areas of abnormal hypermetabolism.  SKELETON  No abnormal marrow activity.  CT IMAGES PERFORMED FOR ATTENUATION CORRECTION  Right maxillary sinus mucous retention cyst or polyp. No cervical adenopathy. Carotid atherosclerosis.  Chest, abdomen, and pelvic  findings deferred to recent diagnostic CTs. Cardiomegaly. Coronary artery atherosclerosis. Mild prostatomegaly. Aortic and branch vessel atherosclerosis.  IMPRESSION: 1. No hypermetabolic metastatic disease from patient's colon carcinoma. 2. Low-level hypermetabolism corresponding to scattered pulmonary ground-glass opacities. Given that some of these are new since 10/10/2014, favored to be infectious or inflammatory. Correlate with infectious symptoms. Recommend followup with chest CT at approximately 3 months.   Electronically Signed   By: Abigail Miyamoto M.D.   On: 11/01/2014 14:44   Dg Chest Port 1 View  11/11/2014   CLINICAL DATA:  Shortness of breath and cough  EXAM: PORTABLE CHEST - 1 VIEW  COMPARISON:  11/09/2014  FINDINGS: Cardiac shadow is again mildly enlarged. The lungs are well aerated bilaterally. No focal infiltrate or sizable effusion is seen. No bony abnormality is noted.  IMPRESSION: No acute abnormality seen.   Electronically Signed   By: Inez Catalina M.D.   On: 11/11/2014 08:08    ASSESSMENT AND PLAN: This is a very pleasant 69 years old African-American male with questionable early stage colon adenocarcinoma status post colon polyp resection in [facility]. His PET scan showed no evidence for metastatic disease but there was low level hypermetabolism corresponding to a scattered pulmonary groundglass opacities that are probably inflammatory in origin. The patient is scheduled for repeat colonoscopy later this week. This will be followed by surgical evaluation for resection if needed. I will see a need for me to see the patient at regular basis at this point but I'll be happy to see him in the future if she was found to have persistent disease or require adjuvant chemotherapy. He was advised to call immediately if he has any concerning symptoms in the interval.  The patient voices understanding of current disease status and treatment options and is in agreement with the  current care plan.  All questions were answered. The patient knows to call the clinic with any problems, questions or concerns. We can certainly see the patient much sooner if necessary.  Disclaimer: This note was dictated with voice recognition software. Similar sounding words can inadvertently be transcribed and may not be corrected upon review.

## 2014-11-28 ENCOUNTER — Emergency Department (HOSPITAL_COMMUNITY): Payer: Medicare Other

## 2014-11-28 ENCOUNTER — Other Ambulatory Visit: Payer: Self-pay

## 2014-11-28 ENCOUNTER — Emergency Department (HOSPITAL_COMMUNITY)
Admission: EM | Admit: 2014-11-28 | Discharge: 2014-11-28 | Disposition: A | Discharge status: Home / Self Care | Payer: Medicare Other | Attending: Emergency Medicine | Admitting: Emergency Medicine

## 2014-11-28 DIAGNOSIS — Z87891 Personal history of nicotine dependence: Secondary | ICD-10-CM | POA: Insufficient documentation

## 2014-11-28 DIAGNOSIS — Z760 Encounter for issue of repeat prescription: Secondary | ICD-10-CM

## 2014-11-28 DIAGNOSIS — R531 Weakness: Secondary | ICD-10-CM | POA: Diagnosis not present

## 2014-11-28 DIAGNOSIS — Z85038 Personal history of other malignant neoplasm of large intestine: Secondary | ICD-10-CM | POA: Insufficient documentation

## 2014-11-28 DIAGNOSIS — I509 Heart failure, unspecified: Secondary | ICD-10-CM | POA: Diagnosis not present

## 2014-11-28 DIAGNOSIS — Z9889 Other specified postprocedural states: Secondary | ICD-10-CM | POA: Diagnosis not present

## 2014-11-28 DIAGNOSIS — Z79899 Other long term (current) drug therapy: Secondary | ICD-10-CM | POA: Insufficient documentation

## 2014-11-28 DIAGNOSIS — R51 Headache: Secondary | ICD-10-CM | POA: Insufficient documentation

## 2014-11-28 DIAGNOSIS — Z7901 Long term (current) use of anticoagulants: Secondary | ICD-10-CM | POA: Insufficient documentation

## 2014-11-28 DIAGNOSIS — Z8619 Personal history of other infectious and parasitic diseases: Secondary | ICD-10-CM | POA: Insufficient documentation

## 2014-11-28 DIAGNOSIS — I4891 Unspecified atrial fibrillation: Secondary | ICD-10-CM | POA: Insufficient documentation

## 2014-11-28 DIAGNOSIS — R404 Transient alteration of awareness: Secondary | ICD-10-CM | POA: Diagnosis not present

## 2014-11-28 DIAGNOSIS — J449 Chronic obstructive pulmonary disease, unspecified: Secondary | ICD-10-CM | POA: Diagnosis not present

## 2014-11-28 DIAGNOSIS — I5020 Unspecified systolic (congestive) heart failure: Secondary | ICD-10-CM

## 2014-11-28 DIAGNOSIS — I10 Essential (primary) hypertension: Secondary | ICD-10-CM | POA: Insufficient documentation

## 2014-11-28 DIAGNOSIS — R0602 Shortness of breath: Secondary | ICD-10-CM | POA: Diagnosis not present

## 2014-11-28 DIAGNOSIS — R519 Headache, unspecified: Secondary | ICD-10-CM

## 2014-11-28 LAB — BASIC METABOLIC PANEL
ANION GAP: 11 (ref 5–15)
BUN: 14 mg/dL (ref 6–23)
CALCIUM: 9.3 mg/dL (ref 8.4–10.5)
CO2: 20 mmol/L (ref 19–32)
Chloride: 107 mmol/L (ref 96–112)
Creatinine, Ser: 0.92 mg/dL (ref 0.50–1.35)
GFR, EST NON AFRICAN AMERICAN: 85 mL/min — AB (ref >90)
Glucose, Bld: 90 mg/dL (ref 70–99)
Potassium: 4.3 mmol/L (ref 3.5–5.1)
Sodium: 138 mmol/L (ref 135–145)

## 2014-11-28 LAB — CBC
HEMATOCRIT: 41.3 % (ref 39.0–52.0)
HEMOGLOBIN: 14 g/dL (ref 13.0–17.0)
MCH: 31.5 pg (ref 26.0–34.0)
MCHC: 33.9 g/dL (ref 30.0–36.0)
MCV: 92.8 fL (ref 78.0–100.0)
Platelets: 206 10*3/uL (ref 150–400)
RBC: 4.45 MIL/uL (ref 4.22–5.81)
RDW: 15 % (ref 11.5–15.5)
WBC: 6.7 10*3/uL (ref 4.0–10.5)

## 2014-11-28 LAB — I-STAT TROPONIN, ED: TROPONIN I, POC: 0.01 ng/mL (ref 0.00–0.08)

## 2014-11-28 LAB — CBG MONITORING, ED: Glucose-Capillary: 87 mg/dL (ref 70–99)

## 2014-11-28 MED ORDER — ACETAMINOPHEN 500 MG PO TABS
500.0000 mg | ORAL_TABLET | Freq: Once | ORAL | Status: AC
  Administered 2014-11-28: 500 mg via ORAL
  Filled 2014-11-28: qty 1

## 2014-11-28 MED ORDER — FUROSEMIDE 20 MG PO TABS: 40.0000 mg | ORAL_TABLET | Freq: Every day | ORAL | Status: DC

## 2014-11-28 MED ORDER — CARVEDILOL 6.25 MG PO TABS: 12.5000 mg | ORAL_TABLET | Freq: Two times a day (BID) | ORAL | Status: DC

## 2014-11-28 MED ORDER — SPIRONOLACTONE 25 MG PO TABS: 12.5000 mg | ORAL_TABLET | Freq: Every day | ORAL | Status: DC

## 2014-11-28 MED ORDER — CARVEDILOL 6.25 MG PO TABS
6.2500 mg | ORAL_TABLET | Freq: Two times a day (BID) | ORAL | Status: DC
  Administered 2014-11-28: 6.25 mg via ORAL
  Filled 2014-11-28 (×2): qty 1

## 2014-11-28 MED ORDER — LOSARTAN POTASSIUM 25 MG PO TABS: 12.5000 mg | ORAL_TABLET | Freq: Every day | ORAL | Status: DC

## 2014-11-28 MED ORDER — ATORVASTATIN CALCIUM 20 MG PO TABS: 20.0000 mg | ORAL_TABLET | Freq: Every day | ORAL | Status: DC

## 2014-11-28 MED ORDER — DIGOXIN 125 MCG PO TABS: 0.1250 mg | ORAL_TABLET | Freq: Every day | ORAL | Status: DC

## 2014-11-28 MED ORDER — AMIODARONE HCL 200 MG PO TABS: 200.0000 mg | ORAL_TABLET | Freq: Two times a day (BID) | ORAL | Status: DC
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
## 2014-11-28 NOTE — ED Notes (Signed)
Pt states he was at [facility] Saturday to eat and left bag of medications, when he returned to [facility], meds could not be found.  Last took Lasix and blood pressure meds, today he has had some dizziness, shortness of breath and chest pain.  No symptoms at this time.  Pt respirations are unlabored.

## 2014-11-28 NOTE — Discharge Instructions (Signed)
Please follow up with your primary care physician in 1-2 days. If you do not have one please call the [facility] number listed above. Please take your medications as prescribed. Please read all discharge instructions and return precautions.   General Headache Without Cause A headache is pain or discomfort felt around the head or neck area. The specific cause of a headache may not be found. There are many causes and types of headaches. A few common ones are:  Tension headaches.  Migraine headaches.  Cluster headaches.  Chronic daily headaches. HOME CARE INSTRUCTIONS   Keep all follow-up appointments with your caregiver or any specialist referral.  Only take over-the-counter or prescription medicines for pain or discomfort as directed by your caregiver.  Lie down in a dark, quiet room when you have a headache.  Keep a headache journal to find out what may trigger your migraine headaches. For example, write down:  What you eat and drink.  How much sleep you get.  Any change to your diet or medicines.  Try massage or other relaxation techniques.  Put ice packs or heat on the head and neck. Use these 3 to 4 times per day for 15 to 20 minutes each time, or as needed.  Limit stress.  Sit up straight, and do not tense your muscles.  Quit smoking if you smoke.  Limit alcohol use.  Decrease the amount of caffeine you drink, or stop drinking caffeine.  Eat and sleep on a regular schedule.  Get 7 to 9 hours of sleep, or as recommended by your caregiver.  Keep lights dim if bright lights bother you and make your headaches worse. SEEK MEDICAL CARE IF:   You have problems with the medicines you were prescribed.  Your medicines are not working.  You have a change from the usual headache.  You have nausea or vomiting. SEEK IMMEDIATE MEDICAL CARE IF:   Your headache becomes severe.  You have a fever.  You have a stiff neck.  You have loss of  vision.  You have muscular weakness or loss of muscle control.  You start losing your balance or have trouble walking.  You feel faint or pass out.  You have severe symptoms that are different from your first symptoms. MAKE SURE YOU:   Understand these instructions.  Will watch your condition.  Will get help right away if you are not doing well or get worse. Document Released: 10/06/2005 Document Revised: 12/29/2011 Document Reviewed: 10/22/2011 [facility] Patient Information 2015 [place], [place]. This information is not intended to replace advice given to you by your health care provider. Make sure you discuss any questions you have with your health care provider.

## 2014-11-28 NOTE — ED Notes (Signed)
Generalized weakness. Went to get prescription from health department and became weak. Been walking more than normal. This is an ongoing situation. Hx. Of afib. No pain. At [facility] yesterday for an colonoscopy. Poor historian.

## 2014-11-28 NOTE — Progress Notes (Signed)
Spoke with dr Delma Post and made aware pt history of cocaine use in past, no urine drug screen needed for day of surgery per dr Delma Post.

## 2014-11-28 NOTE — ED Notes (Signed)
Patient transported to X-ray 

## 2014-11-29 NOTE — Anesthesia Preprocedure Evaluation (Addendum)
Anesthesia Evaluation  Patient identified by MRN, date of birth, ID band Patient awake    Reviewed: Allergy & Precautions, NPO status , Patient's Chart, lab work & pertinent test results  History of Anesthesia Complications Negative for: history of anesthetic complications  Airway Mallampati: II  TM Distance: >3 FB Neck ROM: Full    Dental no notable dental hx. (+) Dental Advisory Given, Poor Dentition   Pulmonary shortness of breath and with exertion, COPD COPD inhaler, former smoker,  breath sounds clear to auscultation  Pulmonary exam normal       Cardiovascular hypertension, Pt. on medications and Pt. on home beta blockers +CHF + dysrhythmias Atrial Fibrillation IIIRhythm:Regular Rate:Normal  Cardiomyopathy: Known since 2012. Echo (11/15) with EF 15%, diffuse hypokinesis, severe LV dilation, severe MR (likely functional), moderate to severe LAE, dilated RV with moderately decreased systolic function, severe TR. 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 HIV negative. LHC/RHC (1/16) with nonobstructive CAD, EF 20-25%, mean RA 9, PA 44/26, mean PCWP 21, CI 2.1.   Cardiology note 11/21/14: "I think that he is stable at this point to undergo the procedure without further testing. Continue Coreg peri-operatively."   Neuro/Psych negative neurological ROS  negative psych ROS   GI/Hepatic (+)     substance abuse  alcohol use and cocaine use, Hepatitis -, CColon ca   Endo/Other  negative endocrine ROS  Renal/GU negative Renal ROS  negative genitourinary   Musculoskeletal negative musculoskeletal ROS (+)   Abdominal   Peds negative pediatric ROS (+)  Hematology negative hematology ROS (+)   Anesthesia Other Findings   Reproductive/Obstetrics negative OB ROS                           Anesthesia Physical Anesthesia Plan  ASA: IV  Anesthesia Plan: MAC   Post-op Pain Management:    Induction:  Intravenous  Airway Management Planned: Natural Airway and Nasal Cannula  Additional Equipment:   Intra-op Plan:   Post-operative Plan:   Informed Consent: I have reviewed the patients History and Physical, chart, labs and discussed the procedure including the risks, benefits and alternatives for the proposed anesthesia with the patient or authorized representative who has indicated his/her understanding and acceptance.   Dental advisory given  Plan Discussed with: CRNA  Anesthesia Plan Comments:         Anesthesia Quick Evaluation

## 2014-11-30 ENCOUNTER — Encounter (HOSPITAL_COMMUNITY): Payer: Self-pay | Admitting: *Deleted

## 2014-11-30 ENCOUNTER — Ambulatory Visit (HOSPITAL_COMMUNITY): Payer: Medicare Other | Admitting: Anesthesiology

## 2014-11-30 ENCOUNTER — Encounter (HOSPITAL_COMMUNITY)
Admission: RE | Disposition: A | Discharge status: Home / Self Care | Payer: Self-pay | Source: Ambulatory Visit | Attending: Gastroenterology

## 2014-11-30 ENCOUNTER — Ambulatory Visit (HOSPITAL_COMMUNITY)
Admission: RE | Admit: 2014-11-30 | Discharge: 2014-11-30 | Disposition: A | Discharge status: Home / Self Care | Payer: Medicare Other | Source: Ambulatory Visit | Attending: Gastroenterology | Admitting: Gastroenterology

## 2014-11-30 DIAGNOSIS — J449 Chronic obstructive pulmonary disease, unspecified: Secondary | ICD-10-CM | POA: Insufficient documentation

## 2014-11-30 DIAGNOSIS — K6389 Other specified diseases of intestine: Secondary | ICD-10-CM | POA: Diagnosis not present

## 2014-11-30 DIAGNOSIS — Z79899 Other long term (current) drug therapy: Secondary | ICD-10-CM | POA: Insufficient documentation

## 2014-11-30 DIAGNOSIS — I4891 Unspecified atrial fibrillation: Secondary | ICD-10-CM | POA: Diagnosis not present

## 2014-11-30 DIAGNOSIS — B1920 Unspecified viral hepatitis C without hepatic coma: Secondary | ICD-10-CM | POA: Diagnosis not present

## 2014-11-30 DIAGNOSIS — R195 Other fecal abnormalities: Secondary | ICD-10-CM | POA: Diagnosis not present

## 2014-11-30 DIAGNOSIS — F17200 Nicotine dependence, unspecified, uncomplicated: Secondary | ICD-10-CM | POA: Diagnosis not present

## 2014-11-30 DIAGNOSIS — Z85038 Personal history of other malignant neoplasm of large intestine: Secondary | ICD-10-CM | POA: Insufficient documentation

## 2014-11-30 DIAGNOSIS — Z09 Encounter for follow-up examination after completed treatment for conditions other than malignant neoplasm: Secondary | ICD-10-CM | POA: Diagnosis present

## 2014-11-30 DIAGNOSIS — D125 Benign neoplasm of sigmoid colon: Secondary | ICD-10-CM | POA: Diagnosis not present

## 2014-11-30 DIAGNOSIS — I509 Heart failure, unspecified: Secondary | ICD-10-CM | POA: Insufficient documentation

## 2014-11-30 DIAGNOSIS — Z8500 Personal history of malignant neoplasm of unspecified digestive organ: Secondary | ICD-10-CM | POA: Diagnosis not present

## 2014-11-30 DIAGNOSIS — K635 Polyp of colon: Secondary | ICD-10-CM | POA: Diagnosis not present

## 2014-11-30 DIAGNOSIS — I10 Essential (primary) hypertension: Secondary | ICD-10-CM | POA: Diagnosis not present

## 2014-11-30 DIAGNOSIS — Z86010 Personal history of colonic polyps: Secondary | ICD-10-CM | POA: Diagnosis not present

## 2014-11-30 DIAGNOSIS — D122 Benign neoplasm of ascending colon: Secondary | ICD-10-CM | POA: Diagnosis not present

## 2014-11-30 HISTORY — PX: COLONOSCOPY WITH PROPOFOL: SHX5780

## 2014-11-30 SURGERY — COLONOSCOPY WITH PROPOFOL
Anesthesia: Monitor Anesthesia Care

## 2014-11-30 MED ORDER — EPINEPHRINE HCL 1 MG/ML IJ SOLN
INTRAMUSCULAR | Status: AC
  Filled 2014-11-30: qty 2

## 2014-11-30 MED ORDER — LACTATED RINGERS IV SOLN
INTRAVENOUS | Status: DC
  Administered 2014-11-30: 1000 mL via INTRAVENOUS

## 2014-11-30 MED ORDER — PROPOFOL 10 MG/ML IV BOLUS
INTRAVENOUS | Status: DC | PRN
  Administered 2014-11-30 (×2): 15 mg via INTRAVENOUS
  Administered 2014-11-30: 30 mg via INTRAVENOUS
  Administered 2014-11-30: 25 mg via INTRAVENOUS
  Administered 2014-11-30 (×3): 15 mg via INTRAVENOUS

## 2014-11-30 MED ORDER — SODIUM CHLORIDE 0.9 % IJ SOLN
INTRAMUSCULAR | Status: AC
  Filled 2014-11-30: qty 40

## 2014-11-30 MED ORDER — LACTATED RINGERS IV SOLN
INTRAVENOUS | Status: DC | PRN
  Administered 2014-11-30: 12:00:00 via INTRAVENOUS

## 2014-11-30 MED ORDER — PROPOFOL 10 MG/ML IV BOLUS
INTRAVENOUS | Status: AC
  Filled 2014-11-30: qty 20

## 2014-11-30 MED ORDER — SODIUM CHLORIDE 0.9 % IV SOLN: INTRAVENOUS | Status: DC

## 2014-11-30 SURGICAL SUPPLY — 21 items

## 2014-11-30 NOTE — Interval H&P Note (Signed)
History and Physical Interval Note:  11/30/2014 11:41 AM  Paul Clayton  has presented today for surgery, with the diagnosis of history of polyps  The various methods of treatment have been discussed with the patient and family. After consideration of risks, benefits and other options for treatment, the patient has consented to  Procedure(s): COLONOSCOPY WITH PROPOFOL (N/A) as a surgical intervention .  The patient's history has been reviewed, patient examined, no change in status, stable for surgery.  I have reviewed the patient's chart and labs.  Questions were answered to the patient's satisfaction.     Milus Banister

## 2014-11-30 NOTE — Transfer of Care (Signed)
Immediate Anesthesia Transfer of Care Note  Patient: Paul Clayton  Procedure(s) Performed: Procedure(s): COLONOSCOPY WITH PROPOFOL (N/A)  Patient Location: PACU  Anesthesia Type:MAC  Level of Consciousness: awake, sedated and patient cooperative  Airway & Oxygen Therapy: Patient Spontanous Breathing and Patient connected to face mask oxygen  Post-op Assessment: Report given to RN and Post -op Vital signs reviewed and stable  Post vital signs: Reviewed and stable  Last Vitals:  Filed Vitals:   11/30/14 1241  BP: 107/68  Pulse: 84  Temp: 36.6 C  Resp: 19    Complications: No apparent anesthesia complications

## 2014-11-30 NOTE — H&P (View-Only) (Signed)
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  CURRENT THERAPY: None  INTERVAL HISTORY: Paul Clayton 69 y.o. male returns to the clinic today for follow-up visit. The patient was seen recently by Dr. Ardis Hughs and Dr. Dalbert Batman. He is scheduled for repeat colonoscopy on 11/30/2014 by Dr. Ardis Hughs. He also had a PET scan performed recently. The patient is feeling fine today with no specific complaints except for fatigue. He denied having any significant weight loss or night sweats. He denied having any chest pain, shortness breath, cough or hemoptysis. He is here today for previously scheduled visit for evaluation of his condition and discussion of his imaging results.  MEDICAL HISTORY: Past Medical History  Diagnosis Date  . A-fib   . Hypertension   . CHF (congestive heart failure)   . COPD (chronic obstructive pulmonary disease)   . Hepatitis C   . Tobacco abuse   . Adenocarcinoma of colon     STAGE 1  . Dysrhythmia     hx of atrial fibrilation  . H/O ETOH abuse     ALLERGIES:  has No Known Allergies.  MEDICATIONS:  Current Outpatient Prescriptions  Medication Sig Dispense Refill  . albuterol-ipratropium (COMBIVENT) 18-103 MCG/ACT inhaler Inhale 1 puff into the lungs every 4 (four) hours as needed for wheezing or shortness of breath. 1 Inhaler 1  . amiodarone (PACERONE) 200 MG tablet Take 1 tablet (200 mg total) by mouth 2 (two) times daily. 60 tablet 3  . atorvastatin (LIPITOR) 20 MG tablet Take 1 tablet (20 mg total) by mouth daily. 30 tablet 3  . carvedilol (COREG) 6.25 MG tablet Take 2 tablets (12.5 mg total) by mouth 2 (two) times daily with a  meal. 60 tablet 5  . digoxin (LANOXIN) 0.125 MG tablet Take 1 tablet (0.125 mg total) by mouth daily. 30 tablet 3  . furosemide (LASIX) 20 MG tablet Take 40 mg by mouth daily.     Marland Kitchen guaiFENesin-dextromethorphan (ROBITUSSIN DM) 100-10 MG/5ML syrup Take 5 mLs by mouth every 4 (four) hours as needed for cough. 118 mL 0  . losartan (COZAAR) 25 MG tablet Take 0.5 tablets (12.5 mg total) by mouth daily. 30 tablet 5  . mometasone-formoterol (DULERA) 200-5 MCG/ACT AERO Inhale 2 puffs into the lungs 2 (two) times daily as needed for wheezing.    Marland Kitchen MOVIPREP 100 G SOLR Take 1 kit (200 g total) by mouth once. 1 kit 0  . rivaroxaban (XARELTO) 20 MG TABS tablet Take 1 tablet (20 mg total) by mouth daily with supper. 30 tablet 3  . senna (SENOKOT) 8.6 MG TABS tablet Take 1 tablet (8.6 mg total) by mouth daily. 30 each 0  . spironolactone (ALDACTONE) 25 MG tablet Take 0.5 tablets (12.5 mg total) by mouth daily. 15 tablet 3  . Umeclidinium Bromide (INCRUSE ELLIPTA) 62.5 MCG/INH AEPB Inhale 1 Inhaler into the lungs daily. 30 each 3   No current facility-administered medications for this visit.    SURGICAL HISTORY:  Past Surgical History  Procedure Laterality Date  . Cardioversion    . Colonoscopy    . Left and right heart catheterization with coronary angiogram N/A 10/23/2014    Procedure: LEFT AND  RIGHT HEART CATHETERIZATION WITH CORONARY ANGIOGRAM;  Surgeon: Larey Dresser, MD;  Location: [facility] CATH LAB;  Service: Cardiovascular;  Laterality: N/A;    REVIEW OF SYSTEMS:  Constitutional: negative Eyes: negative Ears, nose, mouth, throat, and face: negative Respiratory: negative Cardiovascular: negative Gastrointestinal: negative Genitourinary:negative Integument/breast: negative Hematologic/lymphatic: negative Musculoskeletal:negative Neurological: negative Behavioral/Psych: negative Endocrine: negative Allergic/Immunologic: negative   PHYSICAL EXAMINATION: General appearance: alert, cooperative and  no distress Head: Normocephalic, without obvious abnormality, atraumatic Neck: no adenopathy, no JVD, supple, symmetrical, trachea midline and thyroid not enlarged, symmetric, no tenderness/mass/nodules Lymph nodes: Cervical, supraclavicular, and axillary nodes normal. Resp: clear to auscultation bilaterally Back: symmetric, no curvature. ROM normal. No CVA tenderness. Cardio: regular rate and rhythm, S1, S2 normal, no murmur, click, rub or gallop GI: soft, non-tender; bowel sounds normal; no masses,  no organomegaly Extremities: extremities normal, atraumatic, no cyanosis or edema Neurologic: Alert and oriented X 3, normal strength and tone. Normal symmetric reflexes. Normal coordination and gait  ECOG PERFORMANCE STATUS: 1 - Symptomatic but completely ambulatory  There were no vitals taken for this visit.  LABORATORY DATA: Lab Results  Component Value Date   WBC 8.4 11/27/2014   HGB 12.6* 11/27/2014   HCT 40.0 11/27/2014   MCV 93.3 11/27/2014   PLT 245 11/27/2014      Chemistry      Component Value Date/Time   NA 137 11/21/2014 1200   NA 139 10/11/2014 0825   K 4.4 11/21/2014 1200   K 3.9 10/11/2014 0825   CL 107 11/21/2014 1200   CO2 22 11/21/2014 1200   CO2 25 10/11/2014 0825   BUN 17 11/21/2014 1200   BUN 11.6 10/11/2014 0825   CREATININE 0.98 11/21/2014 1200   CREATININE 0.9 10/11/2014 0825      Component Value Date/Time   CALCIUM 8.9 11/21/2014 1200   CALCIUM 8.8 10/11/2014 0825   ALKPHOS 51 11/21/2014 1200   ALKPHOS 67 10/11/2014 0825   AST 35 11/21/2014 1200   AST 29 10/11/2014 0825   ALT 32 11/21/2014 1200   ALT 37 10/11/2014 0825   BILITOT 0.6 11/21/2014 1200   BILITOT 0.84 10/11/2014 0825       RADIOGRAPHIC STUDIES: Dg Chest 2 View  11/09/2014   CLINICAL DATA:  Left-sided chest pain.  Shortness of breath.  EXAM: CHEST  2 VIEW  COMPARISON:  CT chest, abdomen and pelvis 10/10/2014. Single view of the chest 10/13/2014.  FINDINGS: There is cardiomegaly  without edema. Lungs are clear. No pneumothorax or pleural effusion. Remote posttraumatic change right shoulder is noted.  IMPRESSION: No acute disease.   Electronically Signed   By: Inge Rise M.D.   On: 11/09/2014 21:30   Nm Pet Image Initial (pi) Skull Base To Thigh  11/01/2014   CLINICAL DATA:  Initial treatment strategy for evaluation of bilateral pulmonary nodules. History of colon carcinoma.  EXAM: NUCLEAR MEDICINE PET SKULL BASE TO THIGH  TECHNIQUE: 9.1 mCi F-18 FDG was injected intravenously. Full-ring PET imaging was performed from the skull base to thigh after the radiotracer. CT data was obtained and used for attenuation correction and anatomic localization.  FASTING BLOOD GLUCOSE:  Value: 85 mg/dl  COMPARISON:  Chest abdomen and pelvic CTs of 10/10/2014. No prior PET. Most recent chest radiograph of 10/27/2014.  FINDINGS: NECK  No areas of abnormal hypermetabolism.  CHEST  Foci of low-level hypermetabolism corresponding to ground-glass opacities. An area of vague ground-glass opacity in the posterior right apex measures a S.U.V. max of 1.6 on image  10. This is new since the 10/10/14 exam.  More anterior right upper lobe subpleural ground-glass opacity measures 1.9 x 1.6 cm and a S.U.V. max of 1.2 on image 14. Slightly less well-defined than at 3.2 x 2.2 cm on the prior exam. No solid component identified.  Ground-glass opacity within the peribronchovascular superior segment right lower lobe corresponds to hypermetabolism on image 34. This measures a S.U.V. max of 3.0. No well-defined nodule in this area. No ground-glass opacity on the prior exam. Minimal nodularity identified within this area on the prior. Example image 32.  ABDOMEN/PELVIS  No areas of abnormal hypermetabolism.  SKELETON  No abnormal marrow activity.  CT IMAGES PERFORMED FOR ATTENUATION CORRECTION  Right maxillary sinus mucous retention cyst or polyp. No cervical adenopathy. Carotid atherosclerosis.  Chest, abdomen, and pelvic  findings deferred to recent diagnostic CTs. Cardiomegaly. Coronary artery atherosclerosis. Mild prostatomegaly. Aortic and branch vessel atherosclerosis.  IMPRESSION: 1. No hypermetabolic metastatic disease from patient's colon carcinoma. 2. Low-level hypermetabolism corresponding to scattered pulmonary ground-glass opacities. Given that some of these are new since 10/10/2014, favored to be infectious or inflammatory. Correlate with infectious symptoms. Recommend followup with chest CT at approximately 3 months.   Electronically Signed   By: Abigail Miyamoto M.D.   On: 11/01/2014 14:44   Dg Chest Port 1 View  11/11/2014   CLINICAL DATA:  Shortness of breath and cough  EXAM: PORTABLE CHEST - 1 VIEW  COMPARISON:  11/09/2014  FINDINGS: Cardiac shadow is again mildly enlarged. The lungs are well aerated bilaterally. No focal infiltrate or sizable effusion is seen. No bony abnormality is noted.  IMPRESSION: No acute abnormality seen.   Electronically Signed   By: Inez Catalina M.D.   On: 11/11/2014 08:08    ASSESSMENT AND PLAN: This is a very pleasant 69 years old African-American male with questionable early stage colon adenocarcinoma status post colon polyp resection in [facility]. His PET scan showed no evidence for metastatic disease but there was low level hypermetabolism corresponding to a scattered pulmonary groundglass opacities that are probably inflammatory in origin. The patient is scheduled for repeat colonoscopy later this week. This will be followed by surgical evaluation for resection if needed. I will see a need for me to see the patient at regular basis at this point but I'll be happy to see him in the future if she was found to have persistent disease or require adjuvant chemotherapy. He was advised to call immediately if he has any concerning symptoms in the interval.  The patient voices understanding of current disease status and treatment options and is in agreement with the  current care plan.  All questions were answered. The patient knows to call the clinic with any problems, questions or concerns. We can certainly see the patient much sooner if necessary.  Disclaimer: This note was dictated with voice recognition software. Similar sounding words can inadvertently be transcribed and may not be corrected upon review.

## 2014-11-30 NOTE — Discharge Instructions (Signed)

## 2014-11-30 NOTE — Op Note (Signed)
[facility] [place], [address]   COLONOSCOPY PROCEDURE REPORT  PATIENT: Paul, Clayton  MR#: [id number] BIRTHDATE: 17-Jan-1946 , 32  yrs. old GENDER: male ENDOSCOPIST: Milus Banister, MD PROCEDURE DATE:  11/30/2014 PROCEDURE:   Colonoscopy with snare polypectomy First Screening Colonoscopy - Avg.  risk and is 50 yrs.  old or older - No.  Prior Negative Screening - Now for repeat screening. N/A  History of Adenoma - Now for follow-up colonoscopy & has been > or = to 3 yrs.  N/A  Polyps Removed Today? Yes. ASA CLASS:   Class IV INDICATIONS:Colonoscopy, Paul Clayton , 08/24/2014 was performed. Quality of the bowel per position was adequate.  8-10 polyps were removed from the colon ranging in size from 4 mm to 2 cm.  These were throughout the colon.  Some were removed with biopsy summary removed with hot snare.  The large 5 cm mass seen on previous sigmoidoscopy was identified again it appeared to be a pedunculated polyp it was resected and retrieved "completely in piecemeal fashion using hot snare polypectomy" an Endo Clip was placed at the site for breathing for bleeding prophylaxis and the site was also injected with tattoo spot for future identification purposes. 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  The mass at 30 cm was described as "fragments of tubulovillous adenoma with multiple additional foci of intramucosal carcinoma present in each polyp.". MEDICATIONS: Monitored anesthesia care  DESCRIPTION OF PROCEDURE:   After the risks benefits and alternatives of the procedure were thoroughly explained, informed consent was obtained.  The digital rectal exam revealed no abnormalities of the rectum.   The Pentax Ped Colon [id number] endoscope was introduced through the anus and advanced to the cecum, which was identified by both the appendix and ileocecal valve. No adverse events experienced.   The quality of the prep was good.   The instrument was then slowly withdrawn as the colon was fully examined.  COLON FINDINGS: One polyp was found, removed and sent to pathology. This was 75mm across, sessile, located in ascending segment, removed with cold snare (path jar 1).  The site of 08/2014 large polyp (with cancer) resection was clearly visible via mucosal tatoo, scar (located in sigmoid colon, 35cm from the anus).  There was a 39mm soft nodule that was within the scar complex that I completely removed with snare (jar 2).  The examination was otherwise normal. Retroflexed views revealed no abnormalities. The time to cecum=3 minutes 00 seconds.  Withdrawal time=10 minutes 00 seconds.  The scope was withdrawn and the procedure completed. COMPLICATIONS: There were no immediate complications. ENDOSCOPIC IMPRESSION: One polyp was found, removed and sent to pathology.  The site of 08/2014 large polyp (with cancer) resection was clearly visible via mucosal tatoo, scar. There was a 38mm soft nodule that was within the scar complex that I completely removed with snare (jar 2).  The examination was otherwise normal  RECOMMENDATIONS: Await final pathology.  If no residual or recurrent cancer is noted, then you will likely need repeat colonoscopy in 1 year.  Please restart your blood thinners today.  eSigned:  Milus Banister, MD 11/30/2014 12:44 PM  cc: Fanny Skates, MD; Curt Bears, MD

## 2014-11-30 NOTE — Anesthesia Postprocedure Evaluation (Signed)
  Anesthesia Post-op Note  Patient: Paul Clayton  Procedure(s) Performed: Procedure(s) (LRB): COLONOSCOPY WITH PROPOFOL (N/A)  Patient Location: PACU  Anesthesia Type: MAC  Level of Consciousness: awake and alert   Airway and Oxygen Therapy: Patient Spontanous Breathing  Post-op Pain: mild  Post-op Assessment: Post-op Vital signs reviewed, Patient's Cardiovascular Status Stable, Respiratory Function Stable, Patent Airway and No signs of Nausea or vomiting  Last Vitals:  Filed Vitals:   11/30/14 1241  BP: 107/68  Pulse: 84  Temp: 36.6 C  Resp: 19    Post-op Vital Signs: stable   Complications: No apparent anesthesia complications

## 2014-12-01 ENCOUNTER — Encounter (HOSPITAL_COMMUNITY): Payer: Self-pay | Admitting: Gastroenterology

## 2014-12-06 ENCOUNTER — Ambulatory Visit (HOSPITAL_COMMUNITY)
Admission: RE | Admit: 2014-12-06 | Discharge: 2014-12-06 | Disposition: A | Discharge status: Home / Self Care | Payer: Medicare Other | Source: Ambulatory Visit | Attending: Adult Health | Admitting: Adult Health

## 2014-12-06 DIAGNOSIS — I509 Heart failure, unspecified: Secondary | ICD-10-CM | POA: Diagnosis not present

## 2014-12-06 DIAGNOSIS — I5022 Chronic systolic (congestive) heart failure: Secondary | ICD-10-CM

## 2014-12-06 LAB — BASIC METABOLIC PANEL
Anion gap: 8 (ref 5–15)
BUN: 20 mg/dL (ref 6–23)
CO2: 25 mmol/L (ref 19–32)
CREATININE: 1.18 mg/dL (ref 0.50–1.35)
Calcium: 9.3 mg/dL (ref 8.4–10.5)
Chloride: 105 mmol/L (ref 96–112)
GFR calc non Af Amer: 62 mL/min — ABNORMAL LOW (ref >90)
GFR, EST AFRICAN AMERICAN: 72 mL/min — AB (ref >90)
GLUCOSE: 125 mg/dL — AB (ref 70–99)
Potassium: 4.3 mmol/L (ref 3.5–5.1)
Sodium: 138 mmol/L (ref 135–145)

## 2014-12-19 ENCOUNTER — Institutional Professional Consult (permissible substitution): Payer: Medicare Other | Admitting: Pulmonary Disease

## 2014-12-20 ENCOUNTER — Encounter (HOSPITAL_COMMUNITY): Payer: Medicare Other

## 2014-12-22 DIAGNOSIS — Z5181 Encounter for therapeutic drug level monitoring: Secondary | ICD-10-CM | POA: Diagnosis not present

## 2014-12-29 ENCOUNTER — Ambulatory Visit (HOSPITAL_COMMUNITY)
Admission: RE | Admit: 2014-12-29 | Discharge: 2014-12-29 | Disposition: A | Discharge status: Home / Self Care | Payer: Medicare Other | Source: Ambulatory Visit | Attending: Adult Health | Admitting: Adult Health

## 2014-12-29 VITALS — BP 114/60 | HR 64 | Wt 189.8 lb

## 2014-12-29 DIAGNOSIS — C189 Malignant neoplasm of colon, unspecified: Secondary | ICD-10-CM | POA: Diagnosis not present

## 2014-12-29 DIAGNOSIS — B1920 Unspecified viral hepatitis C without hepatic coma: Secondary | ICD-10-CM | POA: Diagnosis not present

## 2014-12-29 DIAGNOSIS — Z7289 Other problems related to lifestyle: Secondary | ICD-10-CM | POA: Diagnosis not present

## 2014-12-29 DIAGNOSIS — I482 Chronic atrial fibrillation: Secondary | ICD-10-CM | POA: Insufficient documentation

## 2014-12-29 DIAGNOSIS — Z79899 Other long term (current) drug therapy: Secondary | ICD-10-CM | POA: Insufficient documentation

## 2014-12-29 DIAGNOSIS — Z87898 Personal history of other specified conditions: Secondary | ICD-10-CM

## 2014-12-29 DIAGNOSIS — I429 Cardiomyopathy, unspecified: Secondary | ICD-10-CM | POA: Insufficient documentation

## 2014-12-29 DIAGNOSIS — Z87891 Personal history of nicotine dependence: Secondary | ICD-10-CM | POA: Diagnosis not present

## 2014-12-29 DIAGNOSIS — I10 Essential (primary) hypertension: Secondary | ICD-10-CM | POA: Insufficient documentation

## 2014-12-29 DIAGNOSIS — F1911 Other psychoactive substance abuse, in remission: Secondary | ICD-10-CM

## 2014-12-29 DIAGNOSIS — I5022 Chronic systolic (congestive) heart failure: Secondary | ICD-10-CM | POA: Diagnosis not present

## 2014-12-29 DIAGNOSIS — I481 Persistent atrial fibrillation: Secondary | ICD-10-CM | POA: Insufficient documentation

## 2014-12-29 DIAGNOSIS — I4819 Other persistent atrial fibrillation: Secondary | ICD-10-CM

## 2014-12-29 DIAGNOSIS — J449 Chronic obstructive pulmonary disease, unspecified: Secondary | ICD-10-CM | POA: Insufficient documentation

## 2014-12-29 DIAGNOSIS — Z7901 Long term (current) use of anticoagulants: Secondary | ICD-10-CM | POA: Insufficient documentation

## 2014-12-29 DIAGNOSIS — Z609 Problem related to social environment, unspecified: Secondary | ICD-10-CM
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
## 2014-12-29 NOTE — Patient Instructions (Signed)
Follow up 4 weeks  Do the following things EVERYDAY: 1) Weigh yourself in the morning before breakfast. Write it down and keep it in a log. 2) Take your medicines as prescribed 3) Eat low salt foods-Limit salt (sodium) to 2000 mg per day.  4) Stay as active as you can everyday 5) Limit all fluids for the day to less than 2 liters 

## 2015-01-05 ENCOUNTER — Other Ambulatory Visit: Payer: Self-pay | Admitting: *Deleted

## 2015-01-05 DIAGNOSIS — Z860100 Personal history of colon polyps, unspecified: Secondary | ICD-10-CM

## 2015-01-06 MED ORDER — MOMETASONE FURO-FORMOTEROL FUM 200-5 MCG/ACT IN AERO: 2.0000 | INHALATION_SPRAY | Freq: Two times a day (BID) | RESPIRATORY_TRACT | Status: DC | PRN

## 2015-01-06 MED ORDER — MOVIPREP 100 G PO SOLR: 1.0000 | Freq: Once | ORAL | Status: DC

## 2015-01-09 DIAGNOSIS — K639 Disease of intestine, unspecified: Secondary | ICD-10-CM | POA: Diagnosis not present

## 2015-01-26 ENCOUNTER — Encounter (HOSPITAL_COMMUNITY): Payer: Self-pay | Admitting: Cardiology

## 2015-01-26 ENCOUNTER — Ambulatory Visit (HOSPITAL_COMMUNITY)
Admission: RE | Admit: 2015-01-26 | Discharge: 2015-01-26 | Disposition: A | Discharge status: Home / Self Care | Payer: Medicare Other | Source: Ambulatory Visit | Attending: Cardiology | Admitting: Cardiology

## 2015-01-26 ENCOUNTER — Telehealth (HOSPITAL_COMMUNITY): Payer: Self-pay | Admitting: Cardiology

## 2015-01-26 VITALS — BP 116/56 | HR 64 | Wt 195.5 lb

## 2015-01-26 DIAGNOSIS — I482 Chronic atrial fibrillation: Secondary | ICD-10-CM | POA: Insufficient documentation

## 2015-01-26 DIAGNOSIS — Z79899 Other long term (current) drug therapy: Secondary | ICD-10-CM | POA: Insufficient documentation

## 2015-01-26 DIAGNOSIS — I4891 Unspecified atrial fibrillation: Secondary | ICD-10-CM | POA: Diagnosis not present

## 2015-01-26 DIAGNOSIS — I429 Cardiomyopathy, unspecified: Secondary | ICD-10-CM | POA: Insufficient documentation

## 2015-01-26 DIAGNOSIS — F17210 Nicotine dependence, cigarettes, uncomplicated: Secondary | ICD-10-CM | POA: Insufficient documentation

## 2015-01-26 DIAGNOSIS — Z7901 Long term (current) use of anticoagulants: Secondary | ICD-10-CM | POA: Insufficient documentation

## 2015-01-26 DIAGNOSIS — I10 Essential (primary) hypertension: Secondary | ICD-10-CM | POA: Diagnosis not present

## 2015-01-26 DIAGNOSIS — J449 Chronic obstructive pulmonary disease, unspecified: Secondary | ICD-10-CM | POA: Insufficient documentation

## 2015-01-26 DIAGNOSIS — I509 Heart failure, unspecified: Secondary | ICD-10-CM

## 2015-01-26 DIAGNOSIS — I4819 Other persistent atrial fibrillation: Secondary | ICD-10-CM

## 2015-01-26 DIAGNOSIS — I481 Persistent atrial fibrillation: Secondary | ICD-10-CM | POA: Diagnosis not present

## 2015-01-26 DIAGNOSIS — J438 Other emphysema: Secondary | ICD-10-CM

## 2015-01-26 DIAGNOSIS — I5022 Chronic systolic (congestive) heart failure: Secondary | ICD-10-CM | POA: Diagnosis not present

## 2015-01-26 LAB — BASIC METABOLIC PANEL
Anion gap: 7 (ref 5–15)
BUN: 14 mg/dL (ref 6–23)
CO2: 24 mmol/L (ref 19–32)
Calcium: 9.1 mg/dL (ref 8.4–10.5)
Chloride: 107 mmol/L (ref 96–112)
Creatinine, Ser: 0.99 mg/dL (ref 0.50–1.35)
GFR calc Af Amer: >90 mL/min (ref >90)
GFR, EST NON AFRICAN AMERICAN: 83 mL/min — AB (ref >90)
Glucose, Bld: 106 mg/dL — ABNORMAL HIGH (ref 70–99)
POTASSIUM: 4.8 mmol/L (ref 3.5–5.1)
SODIUM: 138 mmol/L (ref 135–145)

## 2015-01-26 LAB — TSH: TSH: 1.767 u[IU]/mL (ref 0.350–4.500)

## 2015-01-26 LAB — CBC
HCT: 45 % (ref 39.0–52.0)
Hemoglobin: 15.1 g/dL (ref 13.0–17.0)
MCH: 33 pg (ref 26.0–34.0)
MCHC: 33.6 g/dL (ref 30.0–36.0)
MCV: 98.3 fL (ref 78.0–100.0)
Platelets: 167 10*3/uL (ref 150–400)
RBC: 4.58 MIL/uL (ref 4.22–5.81)
RDW: 13.5 % (ref 11.5–15.5)
WBC: 8.2 10*3/uL (ref 4.0–10.5)

## 2015-01-26 LAB — HEPATIC FUNCTION PANEL
ALT: 60 U/L — AB (ref 0–53)
AST: 53 U/L — ABNORMAL HIGH (ref 0–37)
Albumin: 3.6 g/dL (ref 3.5–5.2)
Alkaline Phosphatase: 61 U/L (ref 39–117)
Bilirubin, Direct: 0.4 mg/dL (ref 0.0–0.5)
Indirect Bilirubin: 0.5 mg/dL (ref 0.3–0.9)
TOTAL PROTEIN: 7.2 g/dL (ref 6.0–8.3)
Total Bilirubin: 0.9 mg/dL (ref 0.3–1.2)

## 2015-01-26 MED ORDER — LOSARTAN POTASSIUM 25 MG PO TABS: 25.0000 mg | ORAL_TABLET | Freq: Every day | ORAL | Status: DC

## 2015-01-26 NOTE — Telephone Encounter (Signed)
Pt scheduled for RHC on 01/30/15 with Dr. Aundra Dubin Cpt code [id number] With pts current insurance-medicare-[employer]

## 2015-01-26 NOTE — Patient Instructions (Signed)
INCREASE Losartan to 25mg , one tab daily  Labs daily  Your physician has requested that you have a TEE/Cardioversion. During a TEE, sound waves are used to create images of your heart. It provides your doctor with information about the size and shape of your heart and how well your heart's chambers and valves are working. In this test, a transducer is attached to the end of a flexible tube that is guided down you throat and into your esophagus (the tube leading from your mouth to your stomach) to get a more detailed image of your heart. Once the TEE has determined that a blood clot is not present, the cardioversion begins. Electrical Cardioversion uses a jolt of electricity to your heart either through paddles or wired patches attached to your chest. This is a controlled, usually prescheduled, procedure. This procedure is done at the hospital and you are not awake during the procedure. You usually go home the day of the procedure. Please see the instruction sheet given to you today for more information.  Your physician recommends that you schedule a follow-up appointment in: 1 month  Do the following things EVERYDAY: 1) Weigh yourself in the morning before breakfast. Write it down and keep it in a log. 2) Take your medicines as prescribed 3) Eat low salt foods-Limit salt (sodium) to 2000 mg per day.  4) Stay as active as you can everyday 5) Limit all fluids for the day to less than 2 liters 6)

## 2015-01-28 NOTE — Progress Notes (Signed)
Patient ID: Paul Clayton, male   DOB: 1945-11-12, 69 y.o.   MRN: [id number] PCP: Dr. Berkley Harvey  69 yo with history of COPD, ETOH and cocaine abuse, HCV, colon cancer, persistent atrial fibrillation, and chronic systolic CHF presents for cardiology evaluation.  He moved recently to [place] from [place].  He says that he has had a cardiomyopathy known since 2012. This has been presumed to be due to cocaine and ETOH abuse.  He is also in persistent atrial fibrillation.  He says that he has been cardioverted twice, the last time was a few months ago in [place].  He is not sure whether he went back into NSR or not.  He is currently in atrial fibrillation.  He says that he quit ETOH, cocaine, and smoking in 10/15.  He was admitted to [facility] in 12/15 with atrial fibrillation and RVR. He had been out of his Coreg x 2 weeks.  He was diuresed and rate-controlled.  He was just discharged.  He had had an episode of syncope prior to admission that was thought to be orthostatic given low BP in the setting of atrial fibrillation with RVR.  Echo in 11/15 showed EF 15% with severe MR and TR and moderately decreased RV systolic function.    LHC/RHC in 1/16 showed no obstructive CAD and mildly elevated filling pressures.  Lasix was increased to 40 mg daily. He was admitted earlier in 1/16 with syncope, possibly defecation syncope.  He has a Lifevest on today but has been wearing it very sporadically when checked.  He was admitted again on 11/09/14 with presyncope and possible COPD exacerbation.  He was started on a prednisone course.   Most recent colonoscopy showed no residual colon cancer, he will not need colectomy.   He returns for followup.  He remains in atrial fibrillation.  He has dyspnea walking up hills/inclines.  Does ok on flat ground.  Some orthopnea.  No chest pain or lightheadedness.  He is now living in a rented house.  Smoking about 2 cigarettes/month.  Rare ETOH, no cocaine.    Labs (12/15): K  4.3, creatinine 0.82, AST normal, ALT 60, HCT 40.9, digoxin 0.7, BNP 4401, HIV negative Labs (10/28/2014): K 4.5 Creatinine 0.90, digoxin 0.9, LFTs normal  Labs (1/16): K 4.3, creatinine 1.11, HCT 41.5, BNP 255, digoxin 0.6 Labs (12/06/2014): K 4.3 Creatinine 1.18   ECG: atrial fibrillation, LVH with repolarization  PMH: 1. COPD: Has cut back a lot on smoking.  2. Cocaine abuse: Quit in 10/15.  3. ETOH abuse: Quit ETOH in 10/15.  4. Atrial fibrillation: Chronic.  Reports DCCV x 2 in [place].  He is not sure whether he went back into NSR or not.  5. HCV 6. 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  PET scan in 12/15: probably no metastatic disease.   7. H/o syncope: Thought to be orthostatic. Also had vagal-type syncope associated with defecation.  8. Cardiomyopathy: Known since 2012.  Echo (11/15) with EF 15%, diffuse hypokinesis, severe LV dilation, severe MR (likely functional), moderate to severe LAE, dilated RV with moderately decreased systolic function, severe TR.  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  HIV negative.  LHC/RHC (1/16) with nonobstructive CAD, EF 20-25%, mean RA 9, PA 44/26, mean PCWP 21, CI 2.1.   SH: Prior ETOH, smoking, and cocaine abuse. Says he quit in 10/15.  Moved here from [place].  Daughter lives in [place]. Lives in a shelter.  FH: No premature CAD.   ROS: All systems reviewed and negative except as per HPI.   Current Outpatient Prescriptions  Medication Sig Dispense Refill  . albuterol-ipratropium (COMBIVENT) 18-103 MCG/ACT inhaler Inhale 1 puff into the lungs every 4 (four) hours as needed for wheezing or shortness of breath. 1 Inhaler 1  . amiodarone (PACERONE) 200 MG tablet Take 1 tablet (200 mg total) by mouth 2 (two) times daily. 30 tablet 0  . atorvastatin (LIPITOR) 20 MG tablet Take 1 tablet (20 mg total) by mouth daily. 30 tablet 0  . carvedilol (COREG) 6.25 MG tablet Take 2 tablets (12.5  mg total) by mouth 2 (two) times daily with a meal. 30 tablet 0  . digoxin (LANOXIN) 0.125 MG tablet Take 1 tablet (0.125 mg total) by mouth daily. 30 tablet 0  . furosemide (LASIX) 20 MG tablet Take 2 tablets (40 mg total) by mouth daily. 30 tablet 0  . losartan (COZAAR) 25 MG tablet Take 1 tablet (25 mg total) by mouth daily. 30 tablet 6  . mometasone-formoterol (DULERA) 200-5 MCG/ACT AERO Inhale 2 puffs into the lungs 2 (two) times daily as needed for wheezing. 13 g 2  . rivaroxaban (XARELTO) 20 MG TABS tablet Take 1 tablet (20 mg total) by mouth daily with supper. 30 tablet 3  . senna (SENOKOT) 8.6 MG TABS tablet Take 1 tablet (8.6 mg total) by mouth daily. 30 each 0  . spironolactone (ALDACTONE) 25 MG tablet Take 0.5 tablets (12.5 mg total) by mouth daily. 15 tablet 0  . Umeclidinium Bromide (INCRUSE ELLIPTA) 62.5 MCG/INH AEPB Inhale 1 Inhaler into the lungs daily. (Patient taking differently: Inhale 1 Inhaler into the lungs daily as needed (shortness of breath). ) 30 each 3  . furosemide (LASIX) 40 MG tablet Take 40 mg by mouth daily.     No current facility-administered medications for this encounter.   BP 116/56 mmHg  Pulse 64  Wt 195 lb 8 oz (88.678 kg)  SpO2 98% General: NAD Neck: No JVD, no thyromegaly or thyroid nodule.  Lungs: Clear to auscultation bilaterally with normal respiratory effort. CV: Nondisplaced PMI.  Heart irregular S1/S2, no S3/S4, 2/6 HSM LLSB and apex.  No peripheral edema.  No carotid bruit.  Normal pedal pulses.  Abdomen: Soft, nontender, no hepatosplenomegaly, no distention.  Skin: Intact without lesions or rashes.  Neurologic: Alert and oriented x 3.  Psych: Normal affect. Extremities: No clubbing or cyanosis.  HEENT: Normal.   Assessment/Plan: 1. Chronic systolic CHF: Cardiomyopathy with LV EF 15% and moderate RV dysfunction.  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  HIV - NR.  NYHA class II-III symptoms.  He is not volume overloaded  on exam.   COPD may play a large role in his symptoms.  - Continue Lasix 40 mg daily.   - Continue Coreg 12.5 mg bid will not up titrate with HR 64. Continue digoxin 0.125 mg daily - Increase losartan to 25 mg daily, BMET today and repeat in 10 days after increasing losartan.     - Continue spironolactone 12.5 mg daily.  - Has been off ETOH (for the most part) and Cocaine for a few months.   Repeat echo in 5/16, if stays clean will consider ICD. He has a Lifevest on but has been very sporadic wearing it.   2. Atrial fibrillation: Persistent.  He has had cardioversions in the past.  Given MR/TR and LA dilation, maintenance of NSR will be difficult but think it would be reasonable  to try once with antiarrhythmic use.  Continue amiodarone 200 mg bid.  He will need regular eye exams. Will check LFTs/TSH today.  Will also get CBC given Xarelto use.  He has not missed any Xarelto doses.  I will arrange for DCCV next week.  3. HCV: LFTs normal on most recent check.   4. COPD: Still smoking 1-2 cigs/day.  Needs to stop completely.  Uses Spiriva.  5. Colon cancer: Most recent colonoscopy showed no residual cancer.  6. Syncope: No further episodes.    Loralie Champagne 01/28/2015

## 2015-01-30 ENCOUNTER — Ambulatory Visit (HOSPITAL_COMMUNITY)
Admission: RE | Admit: 2015-01-30 | Discharge: 2015-01-30 | Disposition: A | Discharge status: Home / Self Care | Payer: Medicare Other | Source: Ambulatory Visit | Attending: Cardiology | Admitting: Cardiology

## 2015-01-30 ENCOUNTER — Encounter (HOSPITAL_COMMUNITY): Payer: Self-pay

## 2015-01-30 ENCOUNTER — Ambulatory Visit (HOSPITAL_COMMUNITY): Payer: Medicare Other | Admitting: Anesthesiology

## 2015-01-30 ENCOUNTER — Encounter (HOSPITAL_COMMUNITY)
Admission: RE | Disposition: A | Discharge status: Home / Self Care | Payer: Self-pay | Source: Ambulatory Visit | Attending: Cardiology

## 2015-01-30 DIAGNOSIS — Z79899 Other long term (current) drug therapy: Secondary | ICD-10-CM | POA: Insufficient documentation

## 2015-01-30 DIAGNOSIS — I10 Essential (primary) hypertension: Secondary | ICD-10-CM | POA: Insufficient documentation

## 2015-01-30 DIAGNOSIS — J449 Chronic obstructive pulmonary disease, unspecified: Secondary | ICD-10-CM | POA: Insufficient documentation

## 2015-01-30 DIAGNOSIS — I5022 Chronic systolic (congestive) heart failure: Secondary | ICD-10-CM | POA: Diagnosis not present

## 2015-01-30 DIAGNOSIS — I2510 Atherosclerotic heart disease of native coronary artery without angina pectoris: Secondary | ICD-10-CM | POA: Insufficient documentation

## 2015-01-30 DIAGNOSIS — F17210 Nicotine dependence, cigarettes, uncomplicated: Secondary | ICD-10-CM | POA: Diagnosis not present

## 2015-01-30 DIAGNOSIS — Z85038 Personal history of other malignant neoplasm of large intestine: Secondary | ICD-10-CM | POA: Insufficient documentation

## 2015-01-30 DIAGNOSIS — F1410 Cocaine abuse, uncomplicated: Secondary | ICD-10-CM | POA: Insufficient documentation

## 2015-01-30 DIAGNOSIS — I4891 Unspecified atrial fibrillation: Secondary | ICD-10-CM | POA: Diagnosis not present

## 2015-01-30 DIAGNOSIS — C189 Malignant neoplasm of colon, unspecified: Secondary | ICD-10-CM | POA: Diagnosis not present

## 2015-01-30 HISTORY — PX: CARDIOVERSION: SHX1299

## 2015-01-30 SURGERY — CARDIOVERSION
Anesthesia: General

## 2015-01-30 MED ORDER — SODIUM CHLORIDE 0.9 % IV SOLN
INTRAVENOUS | Status: DC | PRN
  Administered 2015-01-30: 09:00:00 via INTRAVENOUS

## 2015-01-30 MED ORDER — SODIUM CHLORIDE 0.9 % IV SOLN: INTRAVENOUS | Status: DC

## 2015-01-30 MED ORDER — PROPOFOL 10 MG/ML IV BOLUS
INTRAVENOUS | Status: AC
  Filled 2015-01-30: qty 20

## 2015-01-30 NOTE — Discharge Instructions (Signed)

## 2015-01-30 NOTE — Anesthesia Postprocedure Evaluation (Signed)
  Anesthesia Post-op Note  Patient: Paul Clayton  Procedure(s) Performed: Procedure(s): CARDIOVERSION (N/A)  Patient Location: PACU  Anesthesia Type:MAC  Level of Consciousness: awake, alert  and oriented  Airway and Oxygen Therapy: Patient Spontanous Breathing and Patient connected to nasal cannula oxygen  Post-op Pain: none  Post-op Assessment: Post-op Vital signs reviewed, Patient's Cardiovascular Status Stable, Respiratory Function Stable, Patent Airway, No signs of Nausea or vomiting and Pain level controlled  Post-op Vital Signs: Reviewed and stable  Last Vitals:  Filed Vitals:   01/30/15 0909  BP: 137/71  Pulse: 67  Temp:   Resp: 15    Complications: No apparent anesthesia complications

## 2015-01-30 NOTE — Procedures (Signed)
Electrical Cardioversion Procedure Note Paul Clayton [id number] February 07, 1946  Procedure: Electrical Cardioversion Indications:  Atrial Fibrillation  Procedure Details Consent: Risks of procedure as well as the alternatives and risks of each were explained to the (patient/caregiver).  Consent for procedure obtained. Time Out: Verified patient identification, verified procedure, site/side was marked, verified correct patient position, special equipment/implants available, medications/allergies/relevent history reviewed, required imaging and test results available.  Performed  Patient placed on cardiac monitor, pulse oximetry, supplemental oxygen as necessary.  Sedation given: Propofol Pacer pads placed anterior and posterior chest.  Cardioverted 1 time(s).  Cardioverted at [place].  Evaluation Findings: Post procedure EKG shows: NSR Complications: None Patient did tolerate procedure well.   Loralie Champagne 01/30/2015, 9:19 AM

## 2015-01-30 NOTE — Transfer of Care (Signed)
Immediate Anesthesia Transfer of Care Note  Patient: Paul Clayton  Procedure(s) Performed: Procedure(s): CARDIOVERSION (N/A)  Patient Location: PACU  Anesthesia Type:MAC  Level of Consciousness: awake, alert  and oriented  Airway & Oxygen Therapy: Patient Spontanous Breathing and Patient connected to nasal cannula oxygen  Post-op Assessment: Report given to RN, Post -op Vital signs reviewed and stable and Patient moving all extremities X 4  Post vital signs: Reviewed and stable  Last Vitals:  Filed Vitals:   01/30/15 0909  BP: 137/71  Pulse: 67  Temp:   Resp: 15    Complications: No apparent anesthesia complications

## 2015-01-30 NOTE — Interval H&P Note (Signed)
History and Physical Interval Note:  01/30/2015 9:17 AM  [name]  has presented today for surgery, with the diagnosis of afib  The various methods of treatment have been discussed with the patient and family. After consideration of risks, benefits and other options for treatment, the patient has consented to  Procedure(s): CARDIOVERSION (N/A) as a surgical intervention .  The patient's history has been reviewed, patient examined, no change in status, stable for surgery.  I have reviewed the patient's chart and labs.  Questions were answered to the patient's satisfaction.     Yadir Zentner [employer]

## 2015-01-30 NOTE — H&P (View-Only) (Signed)
Patient ID: Paul Clayton, male   DOB: [birth year], 69 y.o.   MRN: [id number] PCP: Dr. Berkley Harvey  69 yo with history of COPD, ETOH and cocaine abuse, HCV, colon cancer, persistent atrial fibrillation, and chronic systolic CHF presents for cardiology evaluation.  He moved recently to [place] from [place].  He says that he has had a cardiomyopathy known since 2012. This has been presumed to be due to cocaine and ETOH abuse.  He is also in persistent atrial fibrillation.  He says that he has been cardioverted twice, the last time was a few months ago in [place].  He is not sure whether he went back into NSR or not.  He is currently in atrial fibrillation.  He says that he quit ETOH, cocaine, and smoking in 10/15.  He was admitted to [facility] in 12/15 with atrial fibrillation and RVR. He had been out of his Coreg x 2 weeks.  He was diuresed and rate-controlled.  He was just discharged.  He had had an episode of syncope prior to admission that was thought to be orthostatic given low BP in the setting of atrial fibrillation with RVR.  Echo in 11/15 showed EF 15% with severe MR and TR and moderately decreased RV systolic function.    LHC/RHC in 1/16 showed no obstructive CAD and mildly elevated filling pressures.  Lasix was increased to 40 mg daily. He was admitted earlier in 1/16 with syncope, possibly defecation syncope.  He has a Lifevest on today but has been wearing it very sporadically when checked.  He was admitted again on 11/09/14 with presyncope and possible COPD exacerbation.  He was started on a prednisone course.   Most recent colonoscopy showed no residual colon cancer, he will not need colectomy.   He returns for followup.  He remains in atrial fibrillation.  He has dyspnea walking up hills/inclines.  Does ok on flat ground.  Some orthopnea.  No chest pain or lightheadedness.  He is now living in a rented house.  Smoking about 2 cigarettes/month.  Rare ETOH, no cocaine.    Labs (12/15): K  4.3, creatinine 0.82, AST normal, ALT 60, HCT 40.9, digoxin 0.7, BNP 4401, HIV negative Labs (10/28/2014): K 4.5 Creatinine 0.90, digoxin 0.9, LFTs normal  Labs (1/16): K 4.3, creatinine 1.11, HCT 41.5, BNP 255, digoxin 0.6 Labs (12/06/2014): K 4.3 Creatinine 1.18   ECG: atrial fibrillation, LVH with repolarization  PMH: 1. COPD: Has cut back a lot on smoking.  2. Cocaine abuse: Quit in 10/15.  3. ETOH abuse: Quit ETOH in 10/15.  4. Atrial fibrillation: Chronic.  Reports DCCV x 2 in [place].  He is not sure whether he went back into NSR or not.  5. HCV 6. 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  PET scan in 12/15: probably no metastatic disease.   7. H/o syncope: Thought to be orthostatic. Also had vagal-type syncope associated with defecation.  8. Cardiomyopathy: Known since 2012.  Echo (11/15) with EF 15%, diffuse hypokinesis, severe LV dilation, severe MR (likely functional), moderate to severe LAE, dilated RV with moderately decreased systolic function, severe TR.  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  HIV negative.  LHC/RHC (1/16) with nonobstructive CAD, EF 20-25%, mean RA 9, PA 44/26, mean PCWP 21, CI 2.1.   SH: Prior ETOH, smoking, and cocaine abuse. Says he quit in 10/15.  Moved here from [place].  Daughter lives in [place]. Lives in a shelter.  FH: No premature CAD.   ROS: All systems reviewed and negative except as per HPI.   Current Outpatient Prescriptions  Medication Sig Dispense Refill  . albuterol-ipratropium (COMBIVENT) 18-103 MCG/ACT inhaler Inhale 1 puff into the lungs every 4 (four) hours as needed for wheezing or shortness of breath. 1 Inhaler 1  . amiodarone (PACERONE) 200 MG tablet Take 1 tablet (200 mg total) by mouth 2 (two) times daily. 30 tablet 0  . atorvastatin (LIPITOR) 20 MG tablet Take 1 tablet (20 mg total) by mouth daily. 30 tablet 0  . carvedilol (COREG) 6.25 MG tablet Take 2 tablets (12.5  mg total) by mouth 2 (two) times daily with a meal. 30 tablet 0  . digoxin (LANOXIN) 0.125 MG tablet Take 1 tablet (0.125 mg total) by mouth daily. 30 tablet 0  . furosemide (LASIX) 20 MG tablet Take 2 tablets (40 mg total) by mouth daily. 30 tablet 0  . losartan (COZAAR) 25 MG tablet Take 1 tablet (25 mg total) by mouth daily. 30 tablet 6  . mometasone-formoterol (DULERA) 200-5 MCG/ACT AERO Inhale 2 puffs into the lungs 2 (two) times daily as needed for wheezing. 13 g 2  . rivaroxaban (XARELTO) 20 MG TABS tablet Take 1 tablet (20 mg total) by mouth daily with supper. 30 tablet 3  . senna (SENOKOT) 8.6 MG TABS tablet Take 1 tablet (8.6 mg total) by mouth daily. 30 each 0  . spironolactone (ALDACTONE) 25 MG tablet Take 0.5 tablets (12.5 mg total) by mouth daily. 15 tablet 0  . Umeclidinium Bromide (INCRUSE ELLIPTA) 62.5 MCG/INH AEPB Inhale 1 Inhaler into the lungs daily. (Patient taking differently: Inhale 1 Inhaler into the lungs daily as needed (shortness of breath). ) 30 each 3  . furosemide (LASIX) 40 MG tablet Take 40 mg by mouth daily.     No current facility-administered medications for this encounter.   BP 116/56 mmHg  Pulse 64  Wt 195 lb 8 oz (88.678 kg)  SpO2 98% General: NAD Neck: No JVD, no thyromegaly or thyroid nodule.  Lungs: Clear to auscultation bilaterally with normal respiratory effort. CV: Nondisplaced PMI.  Heart irregular S1/S2, no S3/S4, 2/6 HSM LLSB and apex.  No peripheral edema.  No carotid bruit.  Normal pedal pulses.  Abdomen: Soft, nontender, no hepatosplenomegaly, no distention.  Skin: Intact without lesions or rashes.  Neurologic: Alert and oriented x 3.  Psych: Normal affect. Extremities: No clubbing or cyanosis.  HEENT: Normal.   Assessment/Plan: 1. Chronic systolic CHF: Cardiomyopathy with LV EF 15% and moderate RV dysfunction.  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  HIV - NR.  NYHA class II-III symptoms.  He is not volume overloaded  on exam.   COPD may play a large role in his symptoms.  - Continue Lasix 40 mg daily.   - Continue Coreg 12.5 mg bid will not up titrate with HR 64. Continue digoxin 0.125 mg daily - Increase losartan to 25 mg daily, BMET today and repeat in 10 days after increasing losartan.     - Continue spironolactone 12.5 mg daily.  - Has been off ETOH (for the most part) and Cocaine for a few months.   Repeat echo in 5/16, if stays clean will consider ICD. He has a Lifevest on but has been very sporadic wearing it.   2. Atrial fibrillation: Persistent.  He has had cardioversions in the past.  Given MR/TR and LA dilation, maintenance of NSR will be difficult but think it would be reasonable  to try once with antiarrhythmic use.  Continue amiodarone 200 mg bid.  He will need regular eye exams. Will check LFTs/TSH today.  Will also get CBC given Xarelto use.  He has not missed any Xarelto doses.  I will arrange for DCCV next week.  3. HCV: LFTs normal on most recent check.   4. COPD: Still smoking 1-2 cigs/day.  Needs to stop completely.  Uses Spiriva.  5. Colon cancer: Most recent colonoscopy showed no residual cancer.  6. Syncope: No further episodes.    Loralie Champagne 01/28/2015

## 2015-01-30 NOTE — Anesthesia Preprocedure Evaluation (Addendum)
Anesthesia Evaluation  Patient identified by MRN, date of birth, ID band Patient awake    Reviewed: Allergy & Precautions, H&P , NPO status , Patient's Chart, lab work & pertinent test results  Airway Mallampati: II       Dental  (+) Dental Advidsory Given, Poor Dentition   Pulmonary shortness of breath and with exertion, COPDformer smoker,  + rhonchi   + decreased breath sounds      Cardiovascular hypertension, +CHF (Non-ischemic cardiomyopathy. EF 15%.) + dysrhythmias Atrial Fibrillation Rhythm:irregular Rate:Abnormal     Neuro/Psych negative neurological ROS     GI/Hepatic negative GI ROS, (+) Hepatitis -, C  Endo/Other  negative endocrine ROS  Renal/GU negative Renal ROS     Musculoskeletal   Abdominal   Peds  Hematology negative hematology ROS (+)   Anesthesia Other Findings   Reproductive/Obstetrics                         Anesthesia Physical Anesthesia Plan  ASA: IV  Anesthesia Plan: General   Post-op Pain Management:    Induction: Intravenous  Airway Management Planned: Mask  Additional Equipment:   Intra-op Plan:   Post-operative Plan:   Informed Consent: I have reviewed the patients History and Physical, chart, labs and discussed the procedure including the risks, benefits and alternatives for the proposed anesthesia with the patient or authorized representative who has indicated his/her understanding and acceptance.   Dental Advisory Given  Plan Discussed with: CRNA, Anesthesiologist and Surgeon  Anesthesia Plan Comments:        Anesthesia Quick Evaluation

## 2015-01-31 ENCOUNTER — Encounter (HOSPITAL_COMMUNITY): Payer: Self-pay | Admitting: Cardiology

## 2015-02-08 ENCOUNTER — Emergency Department (HOSPITAL_COMMUNITY): Payer: Medicare Other

## 2015-02-08 ENCOUNTER — Encounter (HOSPITAL_COMMUNITY): Payer: Self-pay

## 2015-02-08 ENCOUNTER — Emergency Department (HOSPITAL_COMMUNITY)
Admission: EM | Admit: 2015-02-08 | Discharge: 2015-02-08 | Disposition: A | Discharge status: Home / Self Care | Payer: Medicare Other | Attending: Emergency Medicine | Admitting: Emergency Medicine

## 2015-02-08 DIAGNOSIS — I481 Persistent atrial fibrillation: Secondary | ICD-10-CM

## 2015-02-08 DIAGNOSIS — I509 Heart failure, unspecified: Secondary | ICD-10-CM | POA: Diagnosis not present

## 2015-02-08 DIAGNOSIS — R001 Bradycardia, unspecified: Secondary | ICD-10-CM

## 2015-02-08 DIAGNOSIS — Z85038 Personal history of other malignant neoplasm of large intestine: Secondary | ICD-10-CM | POA: Insufficient documentation

## 2015-02-08 DIAGNOSIS — Z87891 Personal history of nicotine dependence: Secondary | ICD-10-CM | POA: Diagnosis not present

## 2015-02-08 DIAGNOSIS — I429 Cardiomyopathy, unspecified: Secondary | ICD-10-CM

## 2015-02-08 DIAGNOSIS — Z7982 Long term (current) use of aspirin: Secondary | ICD-10-CM | POA: Insufficient documentation

## 2015-02-08 DIAGNOSIS — Z7901 Long term (current) use of anticoagulants: Secondary | ICD-10-CM | POA: Diagnosis not present

## 2015-02-08 DIAGNOSIS — I4891 Unspecified atrial fibrillation: Secondary | ICD-10-CM | POA: Diagnosis not present

## 2015-02-08 DIAGNOSIS — Z9889 Other specified postprocedural states: Secondary | ICD-10-CM | POA: Diagnosis not present

## 2015-02-08 DIAGNOSIS — R0602 Shortness of breath: Secondary | ICD-10-CM | POA: Diagnosis not present

## 2015-02-08 DIAGNOSIS — R0789 Other chest pain: Secondary | ICD-10-CM

## 2015-02-08 DIAGNOSIS — Z4502 Encounter for adjustment and management of automatic implantable cardiac defibrillator: Secondary | ICD-10-CM | POA: Diagnosis present

## 2015-02-08 DIAGNOSIS — I5022 Chronic systolic (congestive) heart failure: Secondary | ICD-10-CM | POA: Diagnosis not present

## 2015-02-08 DIAGNOSIS — J441 Chronic obstructive pulmonary disease with (acute) exacerbation: Secondary | ICD-10-CM | POA: Insufficient documentation

## 2015-02-08 DIAGNOSIS — Z95810 Presence of automatic (implantable) cardiac defibrillator: Secondary | ICD-10-CM | POA: Diagnosis not present

## 2015-02-08 DIAGNOSIS — I10 Essential (primary) hypertension: Secondary | ICD-10-CM | POA: Diagnosis not present

## 2015-02-08 DIAGNOSIS — R079 Chest pain, unspecified: Secondary | ICD-10-CM | POA: Insufficient documentation

## 2015-02-08 DIAGNOSIS — Z8619 Personal history of other infectious and parasitic diseases: Secondary | ICD-10-CM | POA: Insufficient documentation

## 2015-02-08 DIAGNOSIS — Z79899 Other long term (current) drug therapy: Secondary | ICD-10-CM | POA: Diagnosis not present

## 2015-02-08 DIAGNOSIS — Z7951 Long term (current) use of inhaled steroids: Secondary | ICD-10-CM | POA: Diagnosis not present

## 2015-02-08 LAB — CBC
HCT: 40.1 % (ref 39.0–52.0)
Hemoglobin: 13.7 g/dL (ref 13.0–17.0)
MCH: 33 pg (ref 26.0–34.0)
MCHC: 34.2 g/dL (ref 30.0–36.0)
MCV: 96.6 fL (ref 78.0–100.0)
PLATELETS: 163 10*3/uL (ref 150–400)
RBC: 4.15 MIL/uL — AB (ref 4.22–5.81)
RDW: 12.7 % (ref 11.5–15.5)
WBC: 6.5 10*3/uL (ref 4.0–10.5)

## 2015-02-08 LAB — I-STAT TROPONIN, ED: Troponin i, poc: 0.04 ng/mL (ref 0.00–0.08)

## 2015-02-08 LAB — BASIC METABOLIC PANEL
ANION GAP: 9 (ref 5–15)
BUN: 19 mg/dL (ref 6–23)
CALCIUM: 8.8 mg/dL (ref 8.4–10.5)
CHLORIDE: 104 mmol/L (ref 96–112)
CO2: 23 mmol/L (ref 19–32)
CREATININE: 0.88 mg/dL (ref 0.50–1.35)
GFR calc Af Amer: >90 mL/min (ref >90)
GFR calc non Af Amer: 87 mL/min — ABNORMAL LOW (ref >90)
GLUCOSE: 111 mg/dL — AB (ref 70–99)
Potassium: 4.3 mmol/L (ref 3.5–5.1)
Sodium: 136 mmol/L (ref 135–145)

## 2015-02-08 NOTE — ED Notes (Signed)
[facility]- pt coming from home after external defibrillator fired this morning around 0600. He said he has had the defibrillator since February 2016. Hx of CHF and uncontrolled A-fib. Chest pain present after defibrillator fired. 324 of Aspirin on board.

## 2015-02-08 NOTE — Discharge Instructions (Signed)

## 2015-02-08 NOTE — ED Notes (Signed)
Cardiology at bedside.

## 2015-02-08 NOTE — ED Provider Notes (Signed)
CSN: [id number]     Arrival date & time 02/08/15  3154 History   First MD Initiated Contact with Patient 02/08/15 [phone]     Chief Complaint  Patient presents with  . Pacemaker Problem     (Consider location/radiation/quality/duration/timing/severity/associated sxs/prior Treatment) HPI Comments: 69 y/o M presenting after his external defibrillator went off this morning at 545 AM. He was already awake, and was feeling fine until it went off, followed by 3-4 episodes of mid-sternal and L sided chest pain, 2/10, each lasting about 3 seconds. Currently pain free. Admits to mild shortness of breath. Given 324 mg ASA by EMS. Had cardioversion on 2/12 due to uncontrolled Afib. Had defibrillator placed Feb 2016. Denies numbness, tingling, weakness, n/v, diaphoresis. Yesterday he was feeling fine. Denies any issues since cardioversion.  The history is provided by the patient and the EMS personnel.    Past Medical History  Diagnosis Date  . A-fib   . Hypertension   . CHF (congestive heart failure)   . COPD (chronic obstructive pulmonary disease)   . Hepatitis C   . Tobacco abuse   . Adenocarcinoma of colon     STAGE 1  . Dysrhythmia     hx of atrial fibrilation  . H/O ETOH abuse    Past Surgical History  Procedure Laterality Date  . Cardioversion    . Colonoscopy    . Left and right heart catheterization with coronary angiogram N/A 10/23/2014    Procedure: LEFT AND RIGHT HEART CATHETERIZATION WITH CORONARY ANGIOGRAM;  Surgeon: Larey Dresser, MD;  Location: [facility] CATH LAB;  Service: Cardiovascular;  Laterality: N/A;  . Colonoscopy with propofol N/A 11/30/2014    Procedure: COLONOSCOPY WITH PROPOFOL;  Surgeon: Milus Banister, MD;  Location: [facility] ENDOSCOPY;  Service: Endoscopy;  Laterality: N/A;  . Cardioversion N/A 01/30/2015    Procedure: CARDIOVERSION;  Surgeon: Larey Dresser, MD;  Location: [facility] ENDOSCOPY;  Service: Cardiovascular;  Laterality: N/A;   Family History  Problem Relation Age of  Onset  . Hypertension Mother    History  Substance Use Topics  . Smoking status: Former Smoker    Quit date: 09/01/2014  . Smokeless tobacco: Never Used  . Alcohol Use: 0.0 oz/week    0 Standard drinks or equivalent per week     Comment: " i quit drinking alcohol ",Quit 10'15 hx. ETOH abuse    Review of Systems  Respiratory: Positive for shortness of breath.   Cardiovascular: Positive for chest pain.  All other systems reviewed and are negative.     Allergies  Review of patient's allergies indicates no known allergies.  Home Medications   Prior to Admission medications   Medication Sig Start Date End Date Taking? Authorizing Provider  albuterol-ipratropium (COMBIVENT) 18-103 MCG/ACT inhaler Inhale 1 puff into the lungs every 4 (four) hours as needed for wheezing or shortness of breath. 09/12/14  Yes Olam Idler, MD  amiodarone (PACERONE) 200 MG tablet Take 1 tablet (200 mg total) by mouth 2 (two) times daily. 11/28/14  Yes Jennifer Piepenbrink, PA-C  aspirin EC 81 MG tablet Take 81-405 mg by mouth daily as needed for mild pain or moderate pain.   Yes Historical Provider, MD  atorvastatin (LIPITOR) 20 MG tablet Take 1 tablet (20 mg total) by mouth daily. 11/28/14  Yes Jennifer Piepenbrink, PA-C  carvedilol (COREG) 6.25 MG tablet Take 2 tablets (12.5 mg total) by mouth 2 (two) times daily with a meal. 11/28/14  Yes Jennifer Piepenbrink, PA-C  digoxin (LANOXIN)  0.125 MG tablet Take 1 tablet (0.125 mg total) by mouth daily. 11/28/14  Yes Jennifer Piepenbrink, PA-C  furosemide (LASIX) 40 MG tablet Take 40 mg by mouth daily. 12/01/14  Yes Historical Provider, MD  losartan (COZAAR) 25 MG tablet Take 1 tablet (25 mg total) by mouth daily. 01/26/15  Yes Larey Dresser, MD  mometasone-formoterol [facility]) 200-5 MCG/ACT AERO Inhale 2 puffs into the lungs 2 (two) times daily as needed for wheezing. 01/06/15  Yes Leone Brand, MD  rivaroxaban (XARELTO) 20 MG TABS tablet Take 1 tablet (20 mg total)  by mouth daily with supper. 11/21/14  Yes Larey Dresser, MD  senna (SENOKOT) 8.6 MG TABS tablet Take 1 tablet (8.6 mg total) by mouth daily. 10/28/14  Yes [place] N [place], DO  spironolactone (ALDACTONE) 25 MG tablet Take 0.5 tablets (12.5 mg total) by mouth daily. 11/28/14  Yes Jennifer Piepenbrink, PA-C  Umeclidinium Bromide (INCRUSE ELLIPTA) 62.5 MCG/INH AEPB Inhale 1 Inhaler into the lungs daily. Patient taking differently: Inhale 1 Inhaler into the lungs daily as needed (shortness of breath).  11/20/14  Yes Olam Idler, MD   BP 102/55 mmHg  Pulse 43  Temp(Src) 97.7 F (36.5 C) (Oral)  Resp 17  SpO2 100% Physical Exam  Constitutional: He is oriented to person, place, and time. He appears well-developed and well-nourished. No distress.  HENT:  Head: Normocephalic and atraumatic.  Mouth/Throat: Oropharynx is clear and moist.  Eyes: Conjunctivae and EOM are normal. Pupils are equal, round, and reactive to light.  Neck: Normal range of motion. Neck supple. No JVD present.  Cardiovascular: Normal rate, regular rhythm, normal heart sounds and intact distal pulses.   No extremity edema.  Pulmonary/Chest: Effort normal and breath sounds normal. No respiratory distress.  Abdominal: Soft. Bowel sounds are normal. There is no tenderness.  Musculoskeletal: Normal range of motion. He exhibits no edema.  Neurological: He is alert and oriented to person, place, and time. He has normal strength. No sensory deficit.  Speech fluent, goal oriented. Moves limbs without ataxia. Equal grip strength bilateral.  Skin: Skin is warm and dry. He is not diaphoretic.  Psychiatric: He has a normal mood and affect. His behavior is normal.  Nursing note and vitals reviewed.   ED Course  Procedures (including critical care time) Labs Review Labs Reviewed  CBC - Abnormal; Notable for the following:    RBC 4.15 (*)    All other components within normal limits  BASIC METABOLIC PANEL - Abnormal; Notable for the  following:    Glucose, Bld 111 (*)    GFR calc non Af Amer 87 (*)    All other components within normal limits  I-STAT TROPOININ, ED    Imaging Review Dg Chest 2 View  02/08/2015   CLINICAL DATA:  Chest pain, shortness of breath this morning  EXAM: CHEST  2 VIEW  COMPARISON:  11/28/2014  FINDINGS: Cardiomediastinal silhouette is stable. No acute infiltrate or pleural effusion. No pulmonary edema. Stable mild degenerative changes mid thoracic spine.  IMPRESSION: No active cardiopulmonary disease.   Electronically Signed   By: Lahoma Crocker M.D.   On: 02/08/2015 08:07     EKG Interpretation   Date/Time:  Thursday February 08 2015 07:33:39 EDT Ventricular Rate:  48 PR Interval:  197 QRS Duration: 95 QT Interval:  467 QTC Calculation: 417 R Axis:   66 Text Interpretation:  Sinus bradycardia LVH with secondary repolarization  abnormality Anterior ST elevation, probably due to LVH No significant  change since last tracing Confirmed by Winfred Leeds  MD, SAM [phone]) on  02/08/2015 7:46:35 AM      MDM   Final diagnoses:  Chest pain, unspecified chest pain type   NAD. AFVSS. Life vest rep states last event recorded was [facility] 01/29/15. Nurse Jerene Pitch spoke with daughter over phone who states pt does not wear vest like he is supposed to and has stated it fired in the past when it has not. Spoke with cards, will see pt.  Pt seen by cardiology Dr. Renaee Munda who states pt can be discharged home. Doubt firing of defibrillator. No pain at this time. Has f/u with Dr. Aundra Dubin. Stable for d/c. Return precautions given. Patient states understanding of treatment care plan and is agreeable.  Discussed with attending Dr. Winfred Leeds who also evaluated patient and agrees with plan of care.   Carman Ching, PA-C 02/08/15 [place], MD 02/08/15 [phone]

## 2015-02-08 NOTE — Consult Note (Signed)
Reason for Consult: LifeVest shock  Requesting Physician: Winfred Leeds  Cardiologist: Aundra Dubin  HPI: This is a 69 y.o. male with a past medical history significant for COPD, ETOH and cocaine abuse, HCV, colon cancer, persistent atrial fibrillation s/p DCCV 01/28/2015 and chronic systolic CHF (LVEF 16%, severe MR, severe TR, no CAD by cath 2016) who moved recently to [place] from [place]. He says that he quit ETOH, cocaine, and smoking in 10/15. He was admitted to [facility] in December with AF with RVR and acute HF after running out of carvedilol. He had syncope in December (orthostatic?) and January (following bowel movement?).  Presents today after LifeVest "shock" at 0545h today. Device interrogation shows no events since 2 weeks earlier. Gel pack did not discharge. Chest pain is sharp, shooting and very brief, but repetitive. No syncope/presyncope. Occasional palpitations.  CXR without CHF, ECG shows sinus bradycardia and no change from last tracing, low cardiac troponin I.  PMHx:  Past Medical History  Diagnosis Date  . A-fib   . Hypertension   . CHF (congestive heart failure)   . COPD (chronic obstructive pulmonary disease)   . Hepatitis C   . Tobacco abuse   . Adenocarcinoma of colon     STAGE 1  . Dysrhythmia     hx of atrial fibrilation  . H/O ETOH abuse    Past Surgical History  Procedure Laterality Date  . Cardioversion    . Colonoscopy    . Left and right heart catheterization with coronary angiogram N/A 10/23/2014    Procedure: LEFT AND RIGHT HEART CATHETERIZATION WITH CORONARY ANGIOGRAM;  Surgeon: Larey Dresser, MD;  Location: [facility] CATH LAB;  Service: Cardiovascular;  Laterality: N/A;  . Colonoscopy with propofol N/A 11/30/2014    Procedure: COLONOSCOPY WITH PROPOFOL;  Surgeon: Milus Banister, MD;  Location: [facility] ENDOSCOPY;  Service: Endoscopy;  Laterality: N/A;  . Cardioversion N/A 01/30/2015    Procedure: CARDIOVERSION;  Surgeon: Larey Dresser, MD;   Location: [facility] ENDOSCOPY;  Service: Cardiovascular;  Laterality: N/A;    FAMHx: Family History  Problem Relation Age of Onset  . Hypertension Mother     SOCHx:  reports that he quit smoking about 5 months ago. He has never used smokeless tobacco. He reports that he drinks alcohol. He reports that he uses illicit drugs (Cocaine).  ALLERGIES: No Known Allergies  ROS: Pertinent items are noted in HPI.  HOME MEDICATIONS:  (Not in a hospital admission)  HOSPITAL MEDICATIONS: I have reviewed the patient's current medications. No current facility-administered medications on file prior to encounter.   Current Outpatient Prescriptions on File Prior to Encounter  Medication Sig Dispense Refill  . albuterol-ipratropium (COMBIVENT) 18-103 MCG/ACT inhaler Inhale 1 puff into the lungs every 4 (four) hours as needed for wheezing or shortness of breath. 1 Inhaler 1  . amiodarone (PACERONE) 200 MG tablet Take 1 tablet (200 mg total) by mouth 2 (two) times daily. 30 tablet 0  . atorvastatin (LIPITOR) 20 MG tablet Take 1 tablet (20 mg total) by mouth daily. 30 tablet 0  . carvedilol (COREG) 6.25 MG tablet Take 2 tablets (12.5 mg total) by mouth 2 (two) times daily with a meal. 30 tablet 0  . digoxin (LANOXIN) 0.125 MG tablet Take 1 tablet (0.125 mg total) by mouth daily. 30 tablet 0  . furosemide (LASIX) 40 MG tablet Take 40 mg by mouth daily.    Marland Kitchen losartan (COZAAR) 25 MG tablet Take 1 tablet (25 mg total) by  mouth daily. 30 tablet 6  . mometasone-formoterol (DULERA) 200-5 MCG/ACT AERO Inhale 2 puffs into the lungs 2 (two) times daily as needed for wheezing. 13 g 2  . rivaroxaban (XARELTO) 20 MG TABS tablet Take 1 tablet (20 mg total) by mouth daily with supper. 30 tablet 3  . senna (SENOKOT) 8.6 MG TABS tablet Take 1 tablet (8.6 mg total) by mouth daily. 30 each 0  . spironolactone (ALDACTONE) 25 MG tablet Take 0.5 tablets (12.5 mg total) by mouth daily. 15 tablet 0  . Umeclidinium Bromide  (INCRUSE ELLIPTA) 62.5 MCG/INH AEPB Inhale 1 Inhaler into the lungs daily. (Patient taking differently: Inhale 1 Inhaler into the lungs daily as needed (shortness of breath). ) 30 each 3     VITALS: Blood pressure 99/58, temperature 97.7 F (36.5 C), temperature source Oral, resp. rate 21, SpO2 98 %.  PHYSICAL EXAM:  General: Alert, oriented x3, no distress Head: no evidence of trauma, PERRL, EOMI, no exophtalmos or lid lag, no myxedema, no xanthelasma; normal ears, nose and oropharynx Neck: normal jugular venous pulsations and no hepatojugular reflux; brisk carotid pulses without delay and no carotid bruits Chest: clear to auscultation, no signs of consolidation by percussion or palpation, normal fremitus, symmetrical and full respiratory excursions Cardiovascular: normal position and quality of the apical impulse, regular rhythm, normal first heart sound and normal second heart sound, no rub, +S4gallop, no murmur Abdomen: no tenderness or distention, no masses by palpation, no abnormal pulsatility or arterial bruits, normal bowel sounds, no hepatosplenomegaly Extremities: no clubbing, cyanosis;  no edema; 2+ radial, ulnar and brachial pulses bilaterally; 2+ right femoral, posterior tibial and dorsalis pedis pulses; 2+ left femoral, posterior tibial and dorsalis pedis pulses; no subclavian or femoral bruits Neurological: grossly nonfocal   LABS  CBC  Recent Labs  02/08/15 0808  WBC 6.5  HGB 13.7  HCT 40.1  MCV 96.6  PLT 007   Basic Metabolic Panel  Recent Labs  02/08/15 0808  NA 136  K 4.3  CL 104  CO2 23  GLUCOSE 111*  BUN 19  CREATININE 0.88  CALCIUM 8.8   Liver Function Tests No results for input(s): AST, ALT, ALKPHOS, BILITOT, PROT, ALBUMIN in the last 72 hours. No results for input(s): LIPASE, AMYLASE in the last 72 hours. Cardiac Enzymes No results for input(s): CKTOTAL, CKMB, CKMBINDEX, TROPONINI in the last 72 hours. BNP Invalid input(s):  POCBNP D-Dimer No results for input(s): DDIMER in the last 72 hours. Hemoglobin A1C No results for input(s): HGBA1C in the last 72 hours. Fasting Lipid Panel No results for input(s): CHOL, HDL, LDLCALC, TRIG, CHOLHDL, LDLDIRECT in the last 72 hours. Thyroid Function Tests No results for input(s): TSH, T4TOTAL, T3FREE, THYROIDAB in the last 72 hours.  Invalid input(s): FREET3    IMAGING: Dg Chest 2 View  02/08/2015   CLINICAL DATA:  Chest pain, shortness of breath this morning  EXAM: CHEST  2 VIEW  COMPARISON:  11/28/2014  FINDINGS: Cardiomediastinal silhouette is stable. No acute infiltrate or pleural effusion. No pulmonary edema. Stable mild degenerative changes mid thoracic spine.  IMPRESSION: No active cardiopulmonary disease.   Electronically Signed   By: Lahoma Crocker M.D.   On: 02/08/2015 08:07    ECG: Sinus bradycardia, prominent LVH with secondary repol changes  TELEMETRY: Sinus bradycardia, no VT, no AF  IMPRESSION: 1. 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 Musculoskeletal chest pain 3. Severe nonischemic cardiomyopathy 4. No evidence for acute heart failure exacerbation 5. Maintaining normal rhythm following DC  cardioversion 6. Asymptomatic bradycardia die to meds  Reinforced importance of compliance with meds (especially anticoagulant, beta blocker, amiodarone) and wearing LifeVest. Reeducated regarding Life Vest alarm/shock behavior.  RECOMMENDATION: 1. No plan for further cardiac evaluaton at this time. 2. Follow up as scheduled with Dr. Aundra Dubin.  Time Spent Directly with Patient: 60 minutes  Sanda Klein, MD, [facility] [phone] office [phone] pager   02/08/2015, 9:21 AM

## 2015-02-08 NOTE — ED Notes (Signed)
Collected urine sample from urinal. Placed at bedside.

## 2015-02-08 NOTE — ED Provider Notes (Signed)
Patient reports he awakened at 5:30 AM today after his LifeVest delivered a shock. Patient reports he had anterior chest pain lasting 35 minutes which resolved at 6:05 AM. Accompany systems include shortness of breath. He is presently asymptomatic on exam no distress lungs clear auscultation heart regular rate and rhythm abdomen nontender  Orlie Dakin, MD 02/08/15 [phone]

## 2015-02-08 NOTE — ED Notes (Signed)
Called [employer] to have print out/recording of pt's external defibrillator. Life vest rep reports last event was last Monday 01/29/15.

## 2015-02-18 ENCOUNTER — Encounter (HOSPITAL_COMMUNITY): Payer: Self-pay | Admitting: *Deleted

## 2015-02-18 ENCOUNTER — Emergency Department (HOSPITAL_COMMUNITY): Payer: Medicare Other

## 2015-02-18 ENCOUNTER — Emergency Department (HOSPITAL_COMMUNITY)
Admission: EM | Admit: 2015-02-18 | Discharge: 2015-02-18 | Disposition: A | Discharge status: Home / Self Care | Payer: Medicare Other | Attending: Emergency Medicine | Admitting: Emergency Medicine

## 2015-02-18 ENCOUNTER — Other Ambulatory Visit: Payer: Self-pay

## 2015-02-18 DIAGNOSIS — I10 Essential (primary) hypertension: Secondary | ICD-10-CM | POA: Insufficient documentation

## 2015-02-18 DIAGNOSIS — J449 Chronic obstructive pulmonary disease, unspecified: Secondary | ICD-10-CM | POA: Insufficient documentation

## 2015-02-18 DIAGNOSIS — I429 Cardiomyopathy, unspecified: Secondary | ICD-10-CM | POA: Diagnosis not present

## 2015-02-18 DIAGNOSIS — Z7982 Long term (current) use of aspirin: Secondary | ICD-10-CM | POA: Diagnosis not present

## 2015-02-18 DIAGNOSIS — Z9889 Other specified postprocedural states: Secondary | ICD-10-CM | POA: Insufficient documentation

## 2015-02-18 DIAGNOSIS — Z7951 Long term (current) use of inhaled steroids: Secondary | ICD-10-CM | POA: Insufficient documentation

## 2015-02-18 DIAGNOSIS — Z8619 Personal history of other infectious and parasitic diseases: Secondary | ICD-10-CM | POA: Diagnosis not present

## 2015-02-18 DIAGNOSIS — D010 Carcinoma in situ of colon: Secondary | ICD-10-CM | POA: Insufficient documentation

## 2015-02-18 DIAGNOSIS — I4891 Unspecified atrial fibrillation: Secondary | ICD-10-CM | POA: Insufficient documentation

## 2015-02-18 DIAGNOSIS — R0602 Shortness of breath: Secondary | ICD-10-CM | POA: Diagnosis not present

## 2015-02-18 DIAGNOSIS — Z85038 Personal history of other malignant neoplasm of large intestine: Secondary | ICD-10-CM | POA: Diagnosis not present

## 2015-02-18 DIAGNOSIS — Z7902 Long term (current) use of antithrombotics/antiplatelets: Secondary | ICD-10-CM | POA: Diagnosis not present

## 2015-02-18 DIAGNOSIS — R0789 Other chest pain: Secondary | ICD-10-CM

## 2015-02-18 DIAGNOSIS — I255 Ischemic cardiomyopathy: Secondary | ICD-10-CM | POA: Diagnosis not present

## 2015-02-18 DIAGNOSIS — Z79899 Other long term (current) drug therapy: Secondary | ICD-10-CM | POA: Insufficient documentation

## 2015-02-18 DIAGNOSIS — R079 Chest pain, unspecified: Secondary | ICD-10-CM | POA: Diagnosis not present

## 2015-02-18 DIAGNOSIS — I428 Other cardiomyopathies: Secondary | ICD-10-CM

## 2015-02-18 DIAGNOSIS — I509 Heart failure, unspecified: Secondary | ICD-10-CM | POA: Insufficient documentation

## 2015-02-18 LAB — BASIC METABOLIC PANEL
Anion gap: 9 (ref 5–15)
BUN: 22 mg/dL — AB (ref 6–20)
CO2: 25 mmol/L (ref 22–32)
CREATININE: 1.17 mg/dL (ref 0.61–1.24)
Calcium: 9.1 mg/dL (ref 8.9–10.3)
Chloride: 104 mmol/L (ref 101–111)
GFR calc Af Amer: >60 mL/min (ref >60)
Glucose, Bld: 84 mg/dL (ref 70–99)
Potassium: 4.3 mmol/L (ref 3.5–5.1)
SODIUM: 138 mmol/L (ref 135–145)

## 2015-02-18 LAB — CBC
HCT: 36.6 % — ABNORMAL LOW (ref 39.0–52.0)
Hemoglobin: 12.9 g/dL — ABNORMAL LOW (ref 13.0–17.0)
MCH: 33.9 pg (ref 26.0–34.0)
MCHC: 35.2 g/dL (ref 30.0–36.0)
MCV: 96.3 fL (ref 78.0–100.0)
PLATELETS: 181 10*3/uL (ref 150–400)
RBC: 3.8 MIL/uL — AB (ref 4.22–5.81)
RDW: 12.4 % (ref 11.5–15.5)
WBC: 7.3 10*3/uL (ref 4.0–10.5)

## 2015-02-18 LAB — I-STAT TROPONIN, ED: Troponin i, poc: 0.05 ng/mL (ref 0.00–0.08)

## 2015-02-18 LAB — DIGOXIN LEVEL: DIGOXIN LVL: 0.7 ng/mL — AB (ref 0.8–2.0)

## 2015-02-18 LAB — BRAIN NATRIURETIC PEPTIDE: B NATRIURETIC PEPTIDE 5: 97.5 pg/mL (ref 0.0–100.0)

## 2015-02-18 NOTE — Discharge Instructions (Signed)
It was our pleasure to provide your ER care today - we hope that you feel better.  Your heart rate is overly slow at times, into the upper 40's - hold/stop taking your beta blocker medication (carvedilol).  Given recent chest discomfort, low heart rate, etc, follow up with your doctor/cardiologist in the next couple days for recheck - call office tomorrow morning to arrange follow up appointment.  Return to ER right away if worse, recurrent or persistent chest pain, trouble breathing, fevers, weak/faint, defibrillator firing, other concern.     Chest Pain (Nonspecific) It is often hard to give a specific diagnosis for the cause of chest pain. There is always a chance that your pain could be related to something serious, such as a heart attack or a blood clot in the lungs. You need to follow up with your health care provider for further evaluation. CAUSES   Heartburn.  Pneumonia or bronchitis.  Anxiety or stress.  Inflammation around your heart (pericarditis) or lung (pleuritis or pleurisy).  A blood clot in the lung.  A collapsed lung (pneumothorax). It can develop suddenly on its own (spontaneous pneumothorax) or from trauma to the chest.  Shingles infection (herpes zoster virus). The chest wall is composed of bones, muscles, and cartilage. Any of these can be the source of the pain.  The bones can be bruised by injury.  The muscles or cartilage can be strained by coughing or overwork.  The cartilage can be affected by inflammation and become sore (costochondritis). DIAGNOSIS  Lab tests or other studies may be needed to find the cause of your pain. Your health care provider may have you take a test called an ambulatory electrocardiogram (ECG). An ECG records your heartbeat patterns over a 24-hour period. You may also have other tests, such as:  Transthoracic echocardiogram (TTE). During echocardiography, sound waves are used to evaluate how blood flows through your  heart.  Transesophageal echocardiogram (TEE).  Cardiac monitoring. This allows your health care provider to monitor your heart rate and rhythm in real time.  Holter monitor. This is a portable device that records your heartbeat and can help diagnose heart arrhythmias. It allows your health care provider to track your heart activity for several days, if needed.  Stress tests by exercise or by giving medicine that makes the heart beat faster. TREATMENT   Treatment depends on what may be causing your chest pain. Treatment may include:  Acid blockers for heartburn.  Anti-inflammatory medicine.  Pain medicine for inflammatory conditions.  Antibiotics if an infection is present.  You may be advised to change lifestyle habits. This includes stopping smoking and avoiding alcohol, caffeine, and chocolate.  You may be advised to keep your head raised (elevated) when sleeping. This reduces the chance of acid going backward from your stomach into your esophagus. Most of the time, nonspecific chest pain will improve within 2-3 days with rest and mild pain medicine.  HOME CARE INSTRUCTIONS   If antibiotics were prescribed, take them as directed. Finish them even if you start to feel better.  For the next few days, avoid physical activities that bring on chest pain. Continue physical activities as directed.  Do not use any tobacco products, including cigarettes, chewing tobacco, or electronic cigarettes.  Avoid drinking alcohol.  Only take medicine as directed by your health care provider.  Follow your health care provider's suggestions for further testing if your chest pain does not go away.  Keep any follow-up appointments you made. If you do  not go to an appointment, you could develop lasting (chronic) problems with pain. If there is any problem keeping an appointment, call to reschedule. SEEK MEDICAL CARE IF:   Your chest pain does not go away, even after treatment.  You have a rash  with blisters on your chest.  You have a fever. SEEK IMMEDIATE MEDICAL CARE IF:   You have increased chest pain or pain that spreads to your arm, neck, jaw, back, or abdomen.  You have shortness of breath.  You have an increasing cough, or you cough up blood.  You have severe back or abdominal pain.  You feel nauseous or vomit.  You have severe weakness.  You faint.  You have chills. This is an emergency. Do not wait to see if the pain will go away. Get medical help at once. Call your local emergency services (911 in [place] Do not drive yourself to the hospital. MAKE SURE YOU:   Understand these instructions.  Will watch your condition.  Will get help right away if you are not doing well or get worse. Document Released: 07/16/2005 Document Revised: 10/11/2013 Document Reviewed: 05/11/2008 [facility] Patient Information 2015 [place], [place]. This information is not intended to replace advice given to you by your health care provider. Make sure you discuss any questions you have with your health care provider.     Bradycardia Bradycardia is a term for a heart rate (pulse) that, in adults, is slower than 60 beats per minute. A normal rate is 60 to 100 beats per minute. A heart rate below 60 beats per minute may be normal for some adults with healthy hearts. If the rate is too slow, the heart may have trouble pumping the volume of blood the body needs. If the heart rate gets too low, blood flow to the brain may be decreased and may make you feel lightheaded, dizzy, or faint. The heart has a natural pacemaker in the top of the heart called the SA node (sinoatrial or sinus node). This pacemaker sends out regular electrical signals to the muscle of the heart, telling the heart muscle when to beat (contract). The electrical signal travels from the upper parts of the heart (atria) through the AV node (atrioventricular node), to the lower chambers of the heart (ventricles). The ventricles  squeeze, pumping the blood from your heart to your lungs and to the rest of your body. CAUSES   Problem with the heart's electrical system.  Problem with the heart's natural pacemaker.  Heart disease, damage, or infection.  Medications.  Problems with minerals and salts (electrolytes). SYMPTOMS   Fainting (syncope).  Fatigue and weakness.  Shortness of breath (dyspnea).  Chest pain (angina).  Drowsiness.  Confusion. DIAGNOSIS   An electrocardiogram (ECG) can help your caregiver determine the type of slow heart rate you have.  If the cause is not seen on an ECG, you may need to wear a heart monitor that records your heart rhythm for several hours or days.  Blood tests. TREATMENT   Electrolyte supplements.  Medications.  Withholding medication which is causing a slow heart rate.  Pacemaker placement. SEEK IMMEDIATE MEDICAL CARE IF:   You feel lightheaded or faint.  You develop an irregular heart rate.  You feel chest pain or have trouble breathing. MAKE SURE YOU:   Understand these instructions.  Will watch your condition.  Will get help right away if you are not doing well or get worse. Document Released: 06/28/2002 Document Revised: 12/29/2011 Document Reviewed: 01/11/2014 [employer]  Patient Information 2015 [place]. This information is not intended to replace advice given to you by your health care provider. Make sure you discuss any questions you have with your health care provider.

## 2015-02-18 NOTE — ED Notes (Signed)
Lab called to check on pts lab work. Lab states they will send copy of results.

## 2015-02-18 NOTE — ED Notes (Signed)
Print out of lab results given to Dr.steinl

## 2015-02-18 NOTE — ED Provider Notes (Signed)
CSN: [id number]     Arrival date & time 02/18/15  5956 History   First MD Initiated Contact with Patient 02/18/15 [phone]     Chief Complaint  Patient presents with  . Chest Pain     (Consider location/radiation/quality/duration/timing/severity/associated sxs/prior Treatment) Patient is a 69 y.o. male presenting with chest pain. The history is provided by the patient.  Chest Pain Associated symptoms: no abdominal pain, no back pain, no cough, no fever, no headache, no palpitations, no shortness of breath and not vomiting   Patient w hx non-ischemic CM, presents c/o cp in past day. Mild, dull, mid chest, at rest. Non radiating. Not pleuritic. No specific exacerbating or alleviating factors. +sob. No nv or diaphoresis. Pt denies cough or uri c/o. States compliant w normal meds. Denies fever or chills. No leg pain or swelling. Denies orthopnea or pnd. Denies recent defib shocking. Normal appetite, although hasnt eaten yet today.      Past Medical History  Diagnosis Date  . A-fib   . Hypertension   . CHF (congestive heart failure)   . COPD (chronic obstructive pulmonary disease)   . Hepatitis C   . Tobacco abuse   . Adenocarcinoma of colon     STAGE 1  . Dysrhythmia     hx of atrial fibrilation  . H/O ETOH abuse    Past Surgical History  Procedure Laterality Date  . Cardioversion    . Colonoscopy    . Left and right heart catheterization with coronary angiogram N/A 10/23/2014    Procedure: LEFT AND RIGHT HEART CATHETERIZATION WITH CORONARY ANGIOGRAM;  Surgeon: Larey Dresser, MD;  Location: [facility] CATH LAB;  Service: Cardiovascular;  Laterality: N/A;  . Colonoscopy with propofol N/A 11/30/2014    Procedure: COLONOSCOPY WITH PROPOFOL;  Surgeon: Milus Banister, MD;  Location: [facility] ENDOSCOPY;  Service: Endoscopy;  Laterality: N/A;  . Cardioversion N/A 01/30/2015    Procedure: CARDIOVERSION;  Surgeon: Larey Dresser, MD;  Location: [facility] ENDOSCOPY;  Service: Cardiovascular;  Laterality: N/A;    Family History  Problem Relation Age of Onset  . Hypertension Mother    History  Substance Use Topics  . Smoking status: Former Smoker    Quit date: 09/01/2014  . Smokeless tobacco: Never Used  . Alcohol Use: 0.0 oz/week    0 Standard drinks or equivalent per week     Comment: last use 01-16-15    Review of Systems  Constitutional: Negative for fever and chills.  HENT: Negative for sore throat.   Eyes: Negative for redness.  Respiratory: Negative for cough and shortness of breath.   Cardiovascular: Positive for chest pain. Negative for palpitations and leg swelling.  Gastrointestinal: Negative for vomiting, abdominal pain, diarrhea and blood in stool.  Genitourinary: Negative for dysuria and flank pain.  Musculoskeletal: Negative for back pain and neck pain.  Skin: Negative for rash.  Neurological: Negative for syncope and headaches.  Hematological: Does not bruise/bleed easily.  Psychiatric/Behavioral: Negative for confusion.      Allergies  Review of patient's allergies indicates no known allergies.  Home Medications   Prior to Admission medications   Medication Sig Start Date End Date Taking? Authorizing Provider  albuterol-ipratropium (COMBIVENT) 18-103 MCG/ACT inhaler Inhale 1 puff into the lungs every 4 (four) hours as needed for wheezing or shortness of breath. 09/12/14   Olam Idler, MD  amiodarone (PACERONE) 200 MG tablet Take 1 tablet (200 mg total) by mouth 2 (two) times daily. 11/28/14   Baron Sane, PA-C  aspirin EC 81 MG tablet Take 81-405 mg by mouth daily as needed for mild pain or moderate pain.    Historical Provider, MD  atorvastatin (LIPITOR) 20 MG tablet Take 1 tablet (20 mg total) by mouth daily. 11/28/14   Jennifer Piepenbrink, PA-C  carvedilol (COREG) 6.25 MG tablet Take 2 tablets (12.5 mg total) by mouth 2 (two) times daily with a meal. 11/28/14   Baron Sane, PA-C  digoxin (LANOXIN) 0.125 MG tablet Take 1 tablet (0.125 mg total)  by mouth daily. 11/28/14   Jennifer Piepenbrink, PA-C  furosemide (LASIX) 40 MG tablet Take 40 mg by mouth daily. 12/01/14   Historical Provider, MD  losartan (COZAAR) 25 MG tablet Take 1 tablet (25 mg total) by mouth daily. 01/26/15   Larey Dresser, MD  mometasone-formoterol [facility]) 200-5 MCG/ACT AERO Inhale 2 puffs into the lungs 2 (two) times daily as needed for wheezing. 01/06/15   Leone Brand, MD  rivaroxaban (XARELTO) 20 MG TABS tablet Take 1 tablet (20 mg total) by mouth daily with supper. 11/21/14   Larey Dresser, MD  senna (SENOKOT) 8.6 MG TABS tablet Take 1 tablet (8.6 mg total) by mouth daily. 10/28/14   [name] N Rumley, DO  spironolactone (ALDACTONE) 25 MG tablet Take 0.5 tablets (12.5 mg total) by mouth daily. 11/28/14   Jennifer Piepenbrink, PA-C  Umeclidinium Bromide (INCRUSE ELLIPTA) 62.5 MCG/INH AEPB Inhale 1 Inhaler into the lungs daily. Patient taking differently: Inhale 1 Inhaler into the lungs daily as needed (shortness of breath).  11/20/14   Olam Idler, MD   BP 97/50 mmHg  Pulse 51  Temp(Src) 97.8 F (36.6 C) (Oral)  Resp 20  Ht 6' (1.829 m)  Wt 194 lb 3.2 oz (88.089 kg)  BMI 26.33 kg/m2  SpO2 98% Physical Exam  Constitutional: He is oriented to person, place, and time. He appears well-developed and well-nourished. No distress.  HENT:  Mouth/Throat: Oropharynx is clear and moist.  Eyes: Conjunctivae are normal. No scleral icterus.  Neck: Neck supple. No JVD present. No tracheal deviation present.  Cardiovascular: Normal rate, regular rhythm, normal heart sounds and intact distal pulses.   Pulmonary/Chest: Effort normal and breath sounds normal. No accessory muscle usage. No respiratory distress. He exhibits tenderness.  Abdominal: Soft. Bowel sounds are normal. He exhibits no distension and no mass. There is no tenderness. There is no rebound and no guarding.  Genitourinary:  No cva tenderness  Musculoskeletal: Normal range of motion. He exhibits no edema or  tenderness.  Neurological: He is alert and oriented to person, place, and time.  Skin: Skin is warm and dry. No rash noted. He is not diaphoretic.  Psychiatric: He has a normal mood and affect.  Nursing note and vitals reviewed.   ED Course  Procedures (including critical care time) Labs Review   Lab results not crossing into epic.  From review, troponin normal, bnp normal, chemistries and cbc c/w baseline.     Dg Chest 2 View  02/08/2015   CLINICAL DATA:  Chest pain, shortness of breath this morning  EXAM: CHEST  2 VIEW  COMPARISON:  11/28/2014  FINDINGS: Cardiomediastinal silhouette is stable. No acute infiltrate or pleural effusion. No pulmonary edema. Stable mild degenerative changes mid thoracic spine.  IMPRESSION: No active cardiopulmonary disease.   Electronically Signed   By: Lahoma Crocker M.D.   On: 02/08/2015 08:07   Dg Chest Port 1 View  02/18/2015   CLINICAL DATA:  Shortness of breath and cardiac arrhythmia  EXAM: PORTABLE CHEST - 1 VIEW  COMPARISON:  February 08, 2015  FINDINGS: There is no edema or consolidation. Heart size and pulmonary vascularity are normal. No adenopathy. No bone lesions.  IMPRESSION: No edema or consolidation.   Electronically Signed   By: Lowella Grip III M.D.   On: 02/18/2015 10:17     ED ECG REPORT   Date: 02/18/2015  Rate: 52  Rhythm: sinus bradycardia  QRS Axis: normal  Intervals: normal  ST/T Wave abnormalities: nonspecific ST/T changes  Conduction Disutrbances:LVH  Narrative Interpretation:   Old EKG Reviewed: unchanged  I have personally reviewed the EKG tracing  MDM   Iv ns. Labs. Monitor.  Reviewed nursing notes and prior charts for additional history.   Cardiac cath earlier this year - pt dx nonischemic cardiomyopathy, ef 20%.  Pt also w recent eval atypicap cp - cardiology eval and d/c w outpt f/u.  Recheck, pt comfortable, no cp or discomfort. No sob.   Sinus brady.  No evidence failure on labs. Trop normal, recent  prior cath with no signif cad.   Hr 50 on recheck. bp normal.  Will have hold bblocker. Pcp/cardiology f/u in the next 1-2 days.   Pt currently appears stable for d/c.     Lajean Saver, MD 02/18/15 1304

## 2015-02-18 NOTE — ED Notes (Signed)
MD at bedside. 

## 2015-02-18 NOTE — ED Notes (Signed)
Pt states that he began having sharp intermittent left chest pain this morning. Pt was diaphoretic and hypotensive with EMS. Pt alert and oriented. Pt arrives with life vest on. Pt reports taken all daily medications this morning.

## 2015-02-20 ENCOUNTER — Other Ambulatory Visit: Payer: Self-pay | Admitting: Cardiology

## 2015-02-20 ENCOUNTER — Other Ambulatory Visit: Payer: Self-pay | Admitting: *Deleted

## 2015-02-20 DIAGNOSIS — J42 Unspecified chronic bronchitis: Secondary | ICD-10-CM

## 2015-02-20 MED ORDER — UMECLIDINIUM BROMIDE 62.5 MCG/INH IN AEPB: 1.0000 | INHALATION_SPRAY | Freq: Every day | RESPIRATORY_TRACT | Status: DC

## 2015-02-23 ENCOUNTER — Encounter (HOSPITAL_COMMUNITY): Payer: Self-pay

## 2015-02-23 ENCOUNTER — Emergency Department (HOSPITAL_COMMUNITY)
Admission: EM | Admit: 2015-02-23 | Discharge: 2015-02-23 | Disposition: A | Discharge status: Home / Self Care | Payer: Medicare Other | Attending: Emergency Medicine | Admitting: Emergency Medicine

## 2015-02-23 ENCOUNTER — Emergency Department (HOSPITAL_COMMUNITY): Payer: Medicare Other

## 2015-02-23 ENCOUNTER — Encounter (HOSPITAL_COMMUNITY): Payer: Self-pay | Admitting: Cardiology

## 2015-02-23 ENCOUNTER — Ambulatory Visit (HOSPITAL_BASED_OUTPATIENT_CLINIC_OR_DEPARTMENT_OTHER)
Admission: RE | Admit: 2015-02-23 | Discharge: 2015-02-23 | Disposition: A | Discharge status: Home / Self Care | Payer: Medicare Other | Source: Ambulatory Visit | Attending: Internal Medicine | Admitting: Internal Medicine

## 2015-02-23 VITALS — BP 110/58 | HR 63 | Wt 193.2 lb

## 2015-02-23 DIAGNOSIS — M1990 Unspecified osteoarthritis, unspecified site: Secondary | ICD-10-CM

## 2015-02-23 DIAGNOSIS — I5022 Chronic systolic (congestive) heart failure: Secondary | ICD-10-CM

## 2015-02-23 DIAGNOSIS — Z7982 Long term (current) use of aspirin: Secondary | ICD-10-CM | POA: Insufficient documentation

## 2015-02-23 DIAGNOSIS — M47897 Other spondylosis, lumbosacral region: Secondary | ICD-10-CM | POA: Diagnosis not present

## 2015-02-23 DIAGNOSIS — I10 Essential (primary) hypertension: Secondary | ICD-10-CM | POA: Diagnosis not present

## 2015-02-23 DIAGNOSIS — Z8619 Personal history of other infectious and parasitic diseases: Secondary | ICD-10-CM | POA: Insufficient documentation

## 2015-02-23 DIAGNOSIS — Z7901 Long term (current) use of anticoagulants: Secondary | ICD-10-CM | POA: Insufficient documentation

## 2015-02-23 DIAGNOSIS — Z79899 Other long term (current) drug therapy: Secondary | ICD-10-CM | POA: Diagnosis not present

## 2015-02-23 DIAGNOSIS — Z87891 Personal history of nicotine dependence: Secondary | ICD-10-CM | POA: Diagnosis not present

## 2015-02-23 DIAGNOSIS — I480 Paroxysmal atrial fibrillation: Secondary | ICD-10-CM | POA: Diagnosis not present

## 2015-02-23 DIAGNOSIS — M25552 Pain in left hip: Secondary | ICD-10-CM | POA: Diagnosis not present

## 2015-02-23 DIAGNOSIS — Z86018 Personal history of other benign neoplasm: Secondary | ICD-10-CM | POA: Diagnosis not present

## 2015-02-23 DIAGNOSIS — I509 Heart failure, unspecified: Secondary | ICD-10-CM | POA: Diagnosis not present

## 2015-02-23 DIAGNOSIS — M79606 Pain in leg, unspecified: Secondary | ICD-10-CM | POA: Diagnosis not present

## 2015-02-23 DIAGNOSIS — M545 Low back pain: Secondary | ICD-10-CM | POA: Diagnosis not present

## 2015-02-23 DIAGNOSIS — J449 Chronic obstructive pulmonary disease, unspecified: Secondary | ICD-10-CM | POA: Diagnosis not present

## 2015-02-23 DIAGNOSIS — R52 Pain, unspecified: Secondary | ICD-10-CM

## 2015-02-23 DIAGNOSIS — M1612 Unilateral primary osteoarthritis, left hip: Secondary | ICD-10-CM | POA: Diagnosis not present

## 2015-02-23 DIAGNOSIS — Z9889 Other specified postprocedural states: Secondary | ICD-10-CM | POA: Diagnosis not present

## 2015-02-23 DIAGNOSIS — M47896 Other spondylosis, lumbar region: Secondary | ICD-10-CM | POA: Diagnosis not present

## 2015-02-23 LAB — HEPATIC FUNCTION PANEL
ALT: 93 U/L — ABNORMAL HIGH (ref 17–63)
AST: 104 U/L — ABNORMAL HIGH (ref 15–41)
Albumin: 3.8 g/dL (ref 3.5–5.0)
Alkaline Phosphatase: 65 U/L (ref 38–126)
Bilirubin, Direct: 0.3 mg/dL (ref 0.1–0.5)
Indirect Bilirubin: 0.4 mg/dL (ref 0.3–0.9)
Total Bilirubin: 0.7 mg/dL (ref 0.3–1.2)
Total Protein: 7.5 g/dL (ref 6.5–8.1)

## 2015-02-23 MED ORDER — LISINOPRIL 5 MG PO TABS: 10.0000 mg | ORAL_TABLET | Freq: Every day | ORAL | Status: DC

## 2015-02-23 MED ORDER — OXYCODONE-ACETAMINOPHEN 5-325 MG PO TABS: 1.0000 | ORAL_TABLET | Freq: Four times a day (QID) | ORAL | Status: DC | PRN

## 2015-02-23 MED ORDER — OXYCODONE-ACETAMINOPHEN 5-325 MG PO TABS
1.0000 | ORAL_TABLET | Freq: Once | ORAL | Status: AC
  Administered 2015-02-23: 1 via ORAL

## 2015-02-23 MED ORDER — CARVEDILOL 3.125 MG PO TABS
3.1250 mg | ORAL_TABLET | Freq: Two times a day (BID) | ORAL | Status: DC
Start: 2015-02-23 — End: 2015-04-19

## 2015-02-23 MED ORDER — AMIODARONE HCL 200 MG PO TABS: 200.0000 mg | ORAL_TABLET | Freq: Every day | ORAL | Status: DC

## 2015-02-23 MED ORDER — OXYCODONE-ACETAMINOPHEN 5-325 MG PO TABS
1.0000 | ORAL_TABLET | Freq: Once | ORAL | Status: DC
  Filled 2015-02-23: qty 1

## 2015-02-23 NOTE — ED Notes (Signed)
Pt to department via EMS- pt reports left sided hip and leg pain for the past 5 days. Reports the pain is worse with cold weather. Bp-120/98  Hr-68

## 2015-02-23 NOTE — Patient Instructions (Signed)
STOP Losartan.  INCREASE Lisinopril to 10mg  (2 tablets) daily.  START Carvedilol 3.125 mg (1 tablet) twice a day,.  DECREASE Amiodarone to 200 mg (1 tablet) daily.  LABS in 2 weeks. (bmet)  Labs today (liver panel)  FOLLOW UP in 1 month with ECHO.

## 2015-02-23 NOTE — Discharge Instructions (Signed)
Arthritis, Nonspecific °Arthritis is inflammation of a joint. This usually means pain, redness, warmth or swelling are present. One or more joints may be involved. There are a number of types of arthritis. Your caregiver may not be able to tell what type of arthritis you have right away. °CAUSES  °The most common cause of arthritis is the wear and tear on the joint (osteoarthritis). This causes damage to the cartilage, which can break down over time. The knees, hips, back and neck are most often affected by this type of arthritis. °Other types of arthritis and common causes of joint pain include: °· Sprains and other injuries near the joint. Sometimes minor sprains and injuries cause pain and swelling that develop hours later. °· Rheumatoid arthritis. This affects hands, feet and knees. It usually affects both sides of your body at the same time. It is often associated with chronic ailments, fever, weight loss and general weakness. °· Crystal arthritis. Gout and pseudo gout can cause occasional acute severe pain, redness and swelling in the foot, ankle, or knee. °· Infectious arthritis. Bacteria can get into a joint through a break in overlying skin. This can cause infection of the joint. Bacteria and viruses can also spread through the blood and affect your joints. °· Drug, infectious and allergy reactions. Sometimes joints can become mildly painful and slightly swollen with these types of illnesses. °SYMPTOMS  °· Pain is the main symptom. °· Your joint or joints can also be red, swollen and warm or hot to the touch. °· You may have a fever with certain types of arthritis, or even feel overall ill. °· The joint with arthritis will hurt with movement. Stiffness is present with some types of arthritis. °DIAGNOSIS  °Your caregiver will suspect arthritis based on your description of your symptoms and on your exam. Testing may be needed to find the type of arthritis: °· Blood and sometimes urine tests. °· X-ray tests  and sometimes CT or MRI scans. °· Removal of fluid from the joint (arthrocentesis) is done to check for bacteria, crystals or other causes. Your caregiver (or a specialist) will numb the area over the joint with a local anesthetic, and use a needle to remove joint fluid for examination. This procedure is only minimally uncomfortable. °· Even with these tests, your caregiver may not be able to tell what kind of arthritis you have. Consultation with a specialist (rheumatologist) may be helpful. °TREATMENT  °Your caregiver will discuss with you treatment specific to your type of arthritis. If the specific type cannot be determined, then the following general recommendations may apply. °Treatment of severe joint pain includes: °· Rest. °· Elevation. °· Anti-inflammatory medication (for example, ibuprofen) may be prescribed. Avoiding activities that cause increased pain. °· Only take over-the-counter or prescription medicines for pain and discomfort as recommended by your caregiver. °· Cold packs over an inflamed joint may be used for 10 to 15 minutes every hour. Hot packs sometimes feel better, but do not use overnight. Do not use hot packs if you are diabetic without your caregiver's permission. °· A cortisone shot into arthritic joints may help reduce pain and swelling. °· Any acute arthritis that gets worse over the next 1 to 2 days needs to be looked at to be sure there is no joint infection. °Long-term arthritis treatment involves modifying activities and lifestyle to reduce joint stress jarring. This can include weight loss. Also, exercise is needed to nourish the joint cartilage and remove waste. This helps keep the muscles   around the joint strong. °HOME CARE INSTRUCTIONS  °· Do not take aspirin to relieve pain if gout is suspected. This elevates uric acid levels. °· Only take over-the-counter or prescription medicines for pain, discomfort or fever as directed by your caregiver. °· Rest the joint as much as  possible. °· If your joint is swollen, keep it elevated. °· Use crutches if the painful joint is in your leg. °· Drinking plenty of fluids may help for certain types of arthritis. °· Follow your caregiver's dietary instructions. °· Try low-impact exercise such as: °¨ Swimming. °¨ Water aerobics. °¨ Biking. °¨ Walking. °· Morning stiffness is often relieved by a warm shower. °· Put your joints through regular range-of-motion. °SEEK MEDICAL CARE IF:  °· You do not feel better in 24 hours or are getting worse. °· You have side effects to medications, or are not getting better with treatment. °SEEK IMMEDIATE MEDICAL CARE IF:  °· You have a fever. °· You develop severe joint pain, swelling or redness. °· Many joints are involved and become painful and swollen. °· There is severe back pain and/or leg weakness. °· You have loss of bowel or bladder control. °Document Released: 11/13/2004 Document Revised: 12/29/2011 Document Reviewed: 11/29/2008 °ExitCare® Patient Information ©2015 [employer]. This information is not intended to replace advice given to you by your health care provider. Make sure you discuss any questions you have with your health care provider. ° °

## 2015-02-23 NOTE — ED Notes (Signed)
Pt returns from radiology. 

## 2015-02-23 NOTE — ED Provider Notes (Signed)
CSN: [id number]     Arrival date & time 02/23/15  0802 History   First MD Initiated Contact with Patient 02/23/15 0805     Chief Complaint  Patient presents with  . Hip Pain     (Consider location/radiation/quality/duration/timing/severity/associated sxs/prior Treatment) HPI Comments: Pt states that he has been having left hip and left lower back pain that has been going on for a  Couple of months. States that it has been worse again in the last couple of days. Denies numbness, weakness or incontinence. Hasn't taken anything for the symptoms. Pt states that he has talked to his dr about the symptoms but they were more worried about his recent afib and chf problems. Denies any recent falls  The history is provided by the patient. No language interpreter was used.    Past Medical History  Diagnosis Date  . A-fib   . Hypertension   . CHF (congestive heart failure)   . COPD (chronic obstructive pulmonary disease)   . Hepatitis C   . Tobacco abuse   . Adenocarcinoma of colon     STAGE 1  . Dysrhythmia     hx of atrial fibrilation  . H/O ETOH abuse    Past Surgical History  Procedure Laterality Date  . Cardioversion    . Colonoscopy    . Left and right heart catheterization with coronary angiogram N/A 10/23/2014    Procedure: LEFT AND RIGHT HEART CATHETERIZATION WITH CORONARY ANGIOGRAM;  Surgeon: Larey Dresser, MD;  Location: [facility] CATH LAB;  Service: Cardiovascular;  Laterality: N/A;  . Colonoscopy with propofol N/A 11/30/2014    Procedure: COLONOSCOPY WITH PROPOFOL;  Surgeon: Milus Banister, MD;  Location: [facility] ENDOSCOPY;  Service: Endoscopy;  Laterality: N/A;  . Cardioversion N/A 01/30/2015    Procedure: CARDIOVERSION;  Surgeon: Larey Dresser, MD;  Location: [facility] ENDOSCOPY;  Service: Cardiovascular;  Laterality: N/A;   Family History  Problem Relation Age of Onset  . Hypertension Mother    History  Substance Use Topics  . Smoking status: Former Smoker    Quit date: 09/01/2014  .  Smokeless tobacco: Never Used  . Alcohol Use: 0.0 oz/week    0 Standard drinks or equivalent per week     Comment: last use 01-16-15    Review of Systems  All other systems reviewed and are negative.     Allergies  Review of patient's allergies indicates no known allergies.  Home Medications   Prior to Admission medications   Medication Sig Start Date End Date Taking? Authorizing Provider  albuterol-ipratropium (COMBIVENT) 18-103 MCG/ACT inhaler Inhale 1 puff into the lungs every 4 (four) hours as needed for wheezing or shortness of breath. 09/12/14   Olam Idler, MD  amiodarone (PACERONE) 200 MG tablet TAKE 1 TABLET BY MOUTH   TWICE A DAY 02/21/15   Larey Dresser, MD  aspirin EC 81 MG tablet Take 81-405 mg by mouth daily as needed for mild pain or moderate pain.    Historical Provider, MD  atorvastatin (LIPITOR) 20 MG tablet TAKE 1 TABLET BY MOUTH DAILY 02/21/15   Larey Dresser, MD  Kimberly 125 MCG tablet TAKE 1 TABLET BY MOUTH   DAILY 02/21/15   Larey Dresser, MD  furosemide (LASIX) 40 MG tablet Take 40 mg by mouth daily. 12/01/14   Historical Provider, MD  lisinopril (PRINIVIL,ZESTRIL) 5 MG tablet TAKE 1 TABLET BY MOUTH DAILY 02/21/15   Larey Dresser, MD  losartan (COZAAR) 25 MG tablet Take  1 tablet (25 mg total) by mouth daily. Patient taking differently: Take 12.5 mg by mouth daily.  01/26/15   Larey Dresser, MD  mometasone-formoterol [facility]) 200-5 MCG/ACT AERO Inhale 2 puffs into the lungs 2 (two) times daily as needed for wheezing. 01/06/15   Leone Brand, MD  rivaroxaban (XARELTO) 20 MG TABS tablet Take 1 tablet (20 mg total) by mouth daily with supper. 11/21/14   Larey Dresser, MD  senna (SENOKOT) 8.6 MG TABS tablet Take 1 tablet (8.6 mg total) by mouth daily. Patient not taking: Reported on 02/18/2015 10/28/14   Burna Cash Rumley, DO  spironolactone (ALDACTONE) 25 MG tablet Take 0.5 tablets (12.5 mg total) by mouth daily. 11/28/14   Jennifer Piepenbrink, PA-C  Umeclidinium  Bromide (INCRUSE ELLIPTA) 62.5 MCG/INH AEPB Inhale 1 Inhaler into the lungs daily. 02/20/15   Olam Idler, MD   BP 103/61 mmHg  Pulse 72  Temp(Src) 97.5 F (36.4 C) (Oral)  Resp 18  Wt 194 lb (87.998 kg)  SpO2 98% Physical Exam  Constitutional: He is oriented to person, place, and time. He appears well-developed and well-nourished.  Cardiovascular: Normal rate and regular rhythm.   Pulmonary/Chest: Effort normal and breath sounds normal.  Musculoskeletal:  Tender to palpation in the left lower back. Tender in the left lateral hip. Full rom, no shortening or rotation of hip  Neurological: He is alert and oriented to person, place, and time. Coordination normal.  Skin: Skin is warm and dry.  Psychiatric: He has a normal mood and affect.  Nursing note and vitals reviewed.   ED Course  Procedures (including critical care time) Labs Review Labs Reviewed - No data to display  Imaging Review Dg Lumbar Spine Complete  02/23/2015   CLINICAL DATA:  Chronic lumbago with left-sided radicular symptoms  EXAM: LUMBAR SPINE - COMPLETE 4+ VIEW  COMPARISON:  None.  FINDINGS: Frontal, lateral, spot lumbosacral lateral, and bilateral oblique views were obtained. There are 5 non-rib-bearing lumbar type vertebral bodies. There is no fracture or spondylolisthesis. There are anterior osteophytes at all levels. Overall disc spaces appear intact. There is facet osteoarthritic change at L3-4, L4-5, and L5-S1 bilaterally. There is atherosclerotic calcification in the aorta and iliac arteries.  IMPRESSION: Facet osteoarthritic changes several levels. No fracture or spondylolisthesis. Atherosclerotic arterial vascular calcification present.   Electronically Signed   By: Lowella Grip III M.D.   On: 02/23/2015 08:54   Dg Hip Unilat With Pelvis 2-3 Views Left  02/23/2015   CLINICAL DATA:  Left hip pain. No reported injury. Initial evaluation .  EXAM: LEFT HIP (WITH PELVIS) 2-3 VIEWS  COMPARISON:  PET CT  11/01/2014.  FINDINGS: Degenerative changes lumbar spine and both hips. No acute bony abnormality identified. No evidence of acute fracture or dislocation. Stable deformity of the proximal left femur consistent with prior healed fracture.  IMPRESSION: Degenerative changes left hip. Old stable healed fracture proximal left femur. No acute abnormality   Electronically Signed   By: Marcello Moores  Register   On: 02/23/2015 08:56     EKG Interpretation None      MDM   Final diagnoses:  Pain  Arthritis    Pt is neurovascularly intact. No bony lesions noted. Pt was unaware or old femur fracture which could be cause of the symptoms. Will send home with oxycodone for pain and ortho follow up as needed    Glendell Docker, NP 02/23/15 [place], MD 02/23/15 1639

## 2015-02-24 NOTE — Progress Notes (Signed)
Patient ID: Paul Clayton, male   DOB: 05-09-1946, 69 y.o.   MRN: [id number] PCP: Dr. Berkley Harvey  69 yo with history of COPD, ETOH and cocaine abuse, HCV, colon cancer, paroxysmal atrial fibrillation, and chronic systolic CHF presents for cardiology evaluation.  He moved recently to [place] from [place].  He says that he has had a cardiomyopathy known since 2012. This has been presumed to be due to cocaine and ETOH abuse.  He is also in persistent atrial fibrillation.  He says that he has been cardioverted twice, the last time was a few months ago in [place].  He is not sure whether he went back into NSR or not.  He is currently in atrial fibrillation.  He says that he quit ETOH, cocaine, and smoking in 10/15.  He was admitted to [facility] in 12/15 with atrial fibrillation and RVR. He had been out of his Coreg x 2 weeks.  He was diuresed and rate-controlled.  He was just discharged.  He had had an episode of syncope prior to admission that was thought to be orthostatic given low BP in the setting of atrial fibrillation with RVR.  Echo in 11/15 showed EF 15% with severe MR and TR and moderately decreased RV systolic function.    LHC/RHC in 1/16 showed no obstructive CAD and mildly elevated filling pressures.  Lasix was increased to 40 mg daily. He was admitted earlier in 1/16 with syncope, possibly defecation syncope.  He has a Lifevest.  He was admitted again on 11/09/14 with presyncope and possible COPD exacerbation.  He was started on a prednisone course and Coreg was decreased to 6.25 mg bid.   He had a colonoscopy in 11/15 with a mass found, biopsy showed adenocarcinoma.  Most recent GI evaluation suggests that he will not need colectomy.   In 4/16, he underwent DCCV to NSR.  He was bradycardic, so Coreg was stopped.    He returns for follow up.  He remains in NSR with HR in 60s.  Mild dyspnea with exertion. Denies PND/Orthopnea. Has not used cocaine or ETOH in a number of months. He is taking  both lisinopril and losartan.  No chest pain.  Main complaint and main limitation is left hip and back pain.  Left hip films showed an old healed femur fracture.  He has an appointment to see orthopedics.    Labs (12/15): K 4.3, creatinine 0.82, AST normal, ALT 60, HCT 40.9, digoxin 0.7, BNP 4401, HIV negative Labs (10/28/2014): K 4.5 Creatinine 0.90, digoxin 0.9, LFTs normal  Labs (1/16): K 4.3, creatinine 1.11, HCT 41.5, BNP 255, digoxin 0.6 Labs (12/06/2014): K 4.3 Creatinine 1.18  Labs (4/16): AST 53, ALT 60 Labs (5/16): K 4.3, creatinine 1.17, digoxin 0.7, HCT 36.6, BNP 97.5  PMH: 1. COPD: Quit smoking in 10/15.  2. Cocaine abuse: Quit in 10/15.  3. ETOH abuse: Quit ETOH in 10/15.  4. Atrial fibrillation: Paroxysmal.  Reports DCCV x 2 in [place].  DCCV 4/16 => bradycardic => cut back Coreg.  5. HCV 6. 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  PET scan in 12/15: probably no metastatic disease.   7. H/o syncope: Thought to be orthostatic. Also had vagal-type syncope associated with defecation.  8. Cardiomyopathy: Known since 2012.  Echo (11/15) with EF 15%, diffuse hypokinesis, severe LV dilation, severe MR (likely functional), moderate to severe LAE, dilated RV with moderately decreased systolic function, severe TR.  CMP may be due  to cocaine/ETOH.  HIV negative.  LHC/RHC (1/16) with nonobstructive CAD, EF 20-25%, mean RA 9, PA 44/26, mean PCWP 21, CI 2.1.   SH: Prior ETOH, smoking, and cocaine abuse. Says he quit in 10/15.  Moved here from [place].  Daughter lives in [place].   FH: No premature CAD.   ROS: All systems reviewed and negative except as per HPI.   Current Outpatient Prescriptions  Medication Sig Dispense Refill  . albuterol-ipratropium (COMBIVENT) 18-103 MCG/ACT inhaler Inhale 1 puff into the lungs every 4 (four) hours as needed for wheezing or shortness of breath. 1 Inhaler 1  . amiodarone (PACERONE)  200 MG tablet Take 1 tablet (200 mg total) by mouth daily. 30 tablet 0  . atorvastatin (LIPITOR) 20 MG tablet TAKE 1 TABLET BY MOUTH DAILY 30 tablet 0  . DIGOX 125 MCG tablet TAKE 1 TABLET BY MOUTH   DAILY 30 tablet 0  . furosemide (LASIX) 40 MG tablet Take 40 mg by mouth daily.    Marland Kitchen lisinopril (PRINIVIL,ZESTRIL) 5 MG tablet Take 2 tablets (10 mg total) by mouth daily. 30 tablet 0  . mometasone-formoterol (DULERA) 200-5 MCG/ACT AERO Inhale 2 puffs into the lungs 2 (two) times daily as needed for wheezing. 13 g 2  . oxyCODONE-acetaminophen (PERCOCET/ROXICET) 5-325 MG per tablet Take 1-2 tablets by mouth every 6 (six) hours as needed for severe pain. 10 tablet 0  . rivaroxaban (XARELTO) 20 MG TABS tablet Take 1 tablet (20 mg total) by mouth daily with supper. 30 tablet 3  . senna (SENOKOT) 8.6 MG TABS tablet Take 1 tablet (8.6 mg total) by mouth daily. 30 each 0  . spironolactone (ALDACTONE) 25 MG tablet Take 0.5 tablets (12.5 mg total) by mouth daily. 15 tablet 0  . carvedilol (COREG) 3.125 MG tablet Take 1 tablet (3.125 mg total) by mouth 2 (two) times daily with a meal. 60 tablet 3  . Umeclidinium Bromide (INCRUSE ELLIPTA) 62.5 MCG/INH AEPB Inhale 1 Inhaler into the lungs daily. 30 each 3   No current facility-administered medications for this encounter.   BP 110/58 mmHg  Pulse 63  Wt 193 lb 4 oz (87.658 kg)  SpO2 99% General: NAD Neck: No JVD, no thyromegaly or thyroid nodule.  Lungs: Clear to auscultation bilaterally with normal respiratory effort. CV: Nondisplaced PMI.  Heart irregular S1/S2, no S3/S4, 2/6 HSM LLSB and apex.  No peripheral edema.  No carotid bruit.  Normal pedal pulses.  Abdomen: Soft, nontender, no hepatosplenomegaly, no distention.  Skin: Intact without lesions or rashes.  Neurologic: Alert and oriented x 3.  Psych: Normal affect. Extremities: No clubbing or cyanosis.  HEENT: Normal.   Assessment/Plan: 1. Chronic systolic CHF: Cardiomyopathy with LV EF 15% and  moderate RV dysfunction.  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  HIV negative.  NYHA class II currently, not volume overloaded.  Some confusion with meds. - Continue Lasix 40 mg daily.   - Restart Coreg 3.125 mg bid.  - Continue digoxin, recent level was ok.  - He is taking both losartan and lisinopril. Stop losartan, increase lisinopril to 10 mg daily. BMET in 10-14 days.     - Continue spironolactone 12.5 mg daily.  - Has been off ETOH and Cocaine for a few months.  Repeat echo => if EF remains low, will refer for ICD. - Social situation precarious (lives in boarding house, has been homeless)   2. Atrial fibrillation: s/p DCCV, now in NSR on amiodarone.  - Decrease amiodarone to 200 mg  daily.  LFTs mildly elevated in 4/16, will repeat today.  Check TSH.  Will need regular eye exams.  - Continue Xarelto.  3. HCV: LFTs higher in 4/16, repeat today.  4. COPD: Stopped smoking.  Uses Spiriva.  5. Colon cancer: Stable.  6. Syncope: No recurrence.   Followup in 1 month.     Loralie Champagne 02/24/2015

## 2015-02-25 DIAGNOSIS — I480 Paroxysmal atrial fibrillation: Secondary | ICD-10-CM | POA: Insufficient documentation

## 2015-03-01 ENCOUNTER — Observation Stay (HOSPITAL_COMMUNITY)
Admission: EM | Admit: 2015-03-01 | Discharge: 2015-03-02 | Disposition: A | Discharge status: Home / Self Care | Payer: Medicare Other | Attending: Family Medicine | Admitting: Family Medicine

## 2015-03-01 ENCOUNTER — Encounter (HOSPITAL_COMMUNITY): Payer: Self-pay

## 2015-03-01 ENCOUNTER — Inpatient Hospital Stay (HOSPITAL_COMMUNITY): Payer: Medicare Other

## 2015-03-01 DIAGNOSIS — I5020 Unspecified systolic (congestive) heart failure: Secondary | ICD-10-CM

## 2015-03-01 DIAGNOSIS — R072 Precordial pain: Secondary | ICD-10-CM

## 2015-03-01 DIAGNOSIS — R079 Chest pain, unspecified: Secondary | ICD-10-CM | POA: Insufficient documentation

## 2015-03-01 DIAGNOSIS — I5022 Chronic systolic (congestive) heart failure: Secondary | ICD-10-CM | POA: Diagnosis not present

## 2015-03-01 DIAGNOSIS — I429 Cardiomyopathy, unspecified: Secondary | ICD-10-CM | POA: Insufficient documentation

## 2015-03-01 DIAGNOSIS — I480 Paroxysmal atrial fibrillation: Secondary | ICD-10-CM | POA: Diagnosis not present

## 2015-03-01 DIAGNOSIS — Z85038 Personal history of other malignant neoplasm of large intestine: Secondary | ICD-10-CM | POA: Insufficient documentation

## 2015-03-01 DIAGNOSIS — R0789 Other chest pain: Secondary | ICD-10-CM | POA: Diagnosis not present

## 2015-03-01 DIAGNOSIS — I509 Heart failure, unspecified: Secondary | ICD-10-CM | POA: Diagnosis not present

## 2015-03-01 DIAGNOSIS — R778 Other specified abnormalities of plasma proteins: Secondary | ICD-10-CM | POA: Insufficient documentation

## 2015-03-01 DIAGNOSIS — Z7289 Other problems related to lifestyle: Secondary | ICD-10-CM

## 2015-03-01 DIAGNOSIS — R7989 Other specified abnormal findings of blood chemistry: Secondary | ICD-10-CM | POA: Diagnosis not present

## 2015-03-01 DIAGNOSIS — Z590 Homelessness: Secondary | ICD-10-CM | POA: Diagnosis not present

## 2015-03-01 DIAGNOSIS — I4891 Unspecified atrial fibrillation: Secondary | ICD-10-CM | POA: Diagnosis present

## 2015-03-01 DIAGNOSIS — J438 Other emphysema: Secondary | ICD-10-CM

## 2015-03-01 DIAGNOSIS — Z609 Problem related to social environment, unspecified: Secondary | ICD-10-CM

## 2015-03-01 DIAGNOSIS — J449 Chronic obstructive pulmonary disease, unspecified: Secondary | ICD-10-CM | POA: Insufficient documentation

## 2015-03-01 LAB — COMPREHENSIVE METABOLIC PANEL
ALBUMIN: 3.2 g/dL — AB (ref 3.5–5.0)
ALK PHOS: 53 U/L (ref 38–126)
ALT: 99 U/L — ABNORMAL HIGH (ref 17–63)
AST: 96 U/L — AB (ref 15–41)
Anion gap: 6 (ref 5–15)
BILIRUBIN TOTAL: 0.9 mg/dL (ref 0.3–1.2)
BUN: 10 mg/dL (ref 6–20)
CO2: 27 mmol/L (ref 22–32)
Calcium: 8.8 mg/dL — ABNORMAL LOW (ref 8.9–10.3)
Chloride: 105 mmol/L (ref 101–111)
Creatinine, Ser: 0.98 mg/dL (ref 0.61–1.24)
GFR calc Af Amer: >60 mL/min (ref >60)
Glucose, Bld: 96 mg/dL (ref 65–99)
POTASSIUM: 4.1 mmol/L (ref 3.5–5.1)
Sodium: 138 mmol/L (ref 135–145)
TOTAL PROTEIN: 6.4 g/dL — AB (ref 6.5–8.1)

## 2015-03-01 LAB — CBC WITH DIFFERENTIAL/PLATELET
Basophils Absolute: 0 10*3/uL (ref 0.0–0.1)
Basophils Relative: 0 % (ref 0–1)
Eosinophils Absolute: 0.3 10*3/uL (ref 0.0–0.7)
Eosinophils Relative: 4 % (ref 0–5)
HCT: 37.5 % — ABNORMAL LOW (ref 39.0–52.0)
HEMOGLOBIN: 12.8 g/dL — AB (ref 13.0–17.0)
LYMPHS ABS: 1.9 10*3/uL (ref 0.7–4.0)
LYMPHS PCT: 28 % (ref 12–46)
MCH: 32.9 pg (ref 26.0–34.0)
MCHC: 34.1 g/dL (ref 30.0–36.0)
MCV: 96.4 fL (ref 78.0–100.0)
MONOS PCT: 12 % (ref 3–12)
Monocytes Absolute: 0.8 10*3/uL (ref 0.1–1.0)
NEUTROS PCT: 56 % (ref 43–77)
Neutro Abs: 3.8 10*3/uL (ref 1.7–7.7)
PLATELETS: 190 10*3/uL (ref 150–400)
RBC: 3.89 MIL/uL — AB (ref 4.22–5.81)
RDW: 12.4 % (ref 11.5–15.5)
WBC: 6.9 10*3/uL (ref 4.0–10.5)

## 2015-03-01 LAB — TROPONIN I
Troponin I: 0.04 ng/mL — ABNORMAL HIGH (ref <0.031)
Troponin I: 0.04 ng/mL — ABNORMAL HIGH (ref <0.031)
Troponin I: 0.03 ng/mL (ref <0.031)

## 2015-03-01 LAB — BASIC METABOLIC PANEL
ANION GAP: 9 (ref 5–15)
BUN: 14 mg/dL (ref 6–20)
CHLORIDE: 103 mmol/L (ref 101–111)
CO2: 25 mmol/L (ref 22–32)
Calcium: 9 mg/dL (ref 8.9–10.3)
Creatinine, Ser: 0.86 mg/dL (ref 0.61–1.24)
GFR calc Af Amer: >60 mL/min (ref >60)
GFR calc non Af Amer: >60 mL/min (ref >60)
Glucose, Bld: 93 mg/dL (ref 65–99)
POTASSIUM: 3.8 mmol/L (ref 3.5–5.1)
Sodium: 137 mmol/L (ref 135–145)

## 2015-03-01 LAB — I-STAT TROPONIN, ED: Troponin i, poc: 0.03 ng/mL (ref 0.00–0.08)

## 2015-03-01 LAB — D-DIMER, QUANTITATIVE: D-Dimer, Quant: <0.27 ug/mL-FEU (ref 0.00–0.48)

## 2015-03-01 MED ORDER — ONDANSETRON HCL 4 MG/2ML IJ SOLN
4.0000 mg | Freq: Once | INTRAMUSCULAR | Status: AC
  Administered 2015-03-01: 4 mg via INTRAVENOUS
  Filled 2015-03-01: qty 2

## 2015-03-01 MED ORDER — AMIODARONE HCL 200 MG PO TABS
200.0000 mg | ORAL_TABLET | Freq: Every day | ORAL | Status: DC
  Administered 2015-03-01 – 2015-03-02 (×2): 200 mg via ORAL
  Filled 2015-03-01 (×3): qty 1

## 2015-03-01 MED ORDER — ONDANSETRON HCL 4 MG/2ML IJ SOLN: 4.0000 mg | Freq: Four times a day (QID) | INTRAMUSCULAR | Status: DC | PRN

## 2015-03-01 MED ORDER — ACETAMINOPHEN 325 MG PO TABS: 650.0000 mg | ORAL_TABLET | ORAL | Status: DC | PRN

## 2015-03-01 MED ORDER — FUROSEMIDE 40 MG PO TABS
40.0000 mg | ORAL_TABLET | Freq: Every day | ORAL | Status: DC
  Administered 2015-03-01 – 2015-03-02 (×2): 40 mg via ORAL
  Filled 2015-03-01 (×2): qty 1

## 2015-03-01 MED ORDER — CARVEDILOL 3.125 MG PO TABS
3.1250 mg | ORAL_TABLET | Freq: Two times a day (BID) | ORAL | Status: DC
  Administered 2015-03-01 – 2015-03-02 (×2): 3.125 mg via ORAL
  Filled 2015-03-01 (×4): qty 1

## 2015-03-01 MED ORDER — IPRATROPIUM-ALBUTEROL 0.5-2.5 (3) MG/3ML IN SOLN: 3.0000 mL | RESPIRATORY_TRACT | Status: DC | PRN

## 2015-03-01 MED ORDER — DIGOXIN 125 MCG PO TABS
0.1250 mg | ORAL_TABLET | Freq: Every day | ORAL | Status: DC
  Administered 2015-03-01 – 2015-03-02 (×2): 0.125 mg via ORAL
  Filled 2015-03-01 (×2): qty 1

## 2015-03-01 MED ORDER — NITROGLYCERIN 0.4 MG SL SUBL
0.4000 mg | SUBLINGUAL_TABLET | SUBLINGUAL | Status: DC | PRN
  Administered 2015-03-01: 0.4 mg via SUBLINGUAL
  Filled 2015-03-01 (×2): qty 1

## 2015-03-01 MED ORDER — MOMETASONE FURO-FORMOTEROL FUM 200-5 MCG/ACT IN AERO
2.0000 | INHALATION_SPRAY | Freq: Two times a day (BID) | RESPIRATORY_TRACT | Status: DC
  Administered 2015-03-02: 2 via RESPIRATORY_TRACT
  Filled 2015-03-01: qty 8.8

## 2015-03-01 MED ORDER — RIVAROXABAN 20 MG PO TABS
20.0000 mg | ORAL_TABLET | Freq: Every day | ORAL | Status: DC
  Administered 2015-03-01: 20 mg via ORAL
  Filled 2015-03-01 (×2): qty 1

## 2015-03-01 MED ORDER — UMECLIDINIUM BROMIDE 62.5 MCG/INH IN AEPB: 1.0000 | INHALATION_SPRAY | Freq: Every day | RESPIRATORY_TRACT | Status: DC

## 2015-03-01 MED ORDER — ASPIRIN 81 MG PO CHEW
324.0000 mg | CHEWABLE_TABLET | Freq: Once | ORAL | Status: DC
  Filled 2015-03-01: qty 4

## 2015-03-01 MED ORDER — ASPIRIN EC 325 MG PO TBEC
325.0000 mg | DELAYED_RELEASE_TABLET | Freq: Every day | ORAL | Status: DC
  Administered 2015-03-02: 325 mg via ORAL
  Filled 2015-03-01: qty 1

## 2015-03-01 MED ORDER — ATORVASTATIN CALCIUM 20 MG PO TABS
20.0000 mg | ORAL_TABLET | Freq: Every day | ORAL | Status: DC
  Administered 2015-03-01: 20 mg via ORAL
  Filled 2015-03-01 (×2): qty 1

## 2015-03-01 MED ORDER — LISINOPRIL 10 MG PO TABS
10.0000 mg | ORAL_TABLET | Freq: Every day | ORAL | Status: DC
  Administered 2015-03-01 – 2015-03-02 (×2): 10 mg via ORAL
  Filled 2015-03-01 (×2): qty 1

## 2015-03-01 MED ORDER — MORPHINE SULFATE 2 MG/ML IJ SOLN: 2.0000 mg | INTRAMUSCULAR | Status: DC | PRN

## 2015-03-01 MED ORDER — IPRATROPIUM-ALBUTEROL 18-103 MCG/ACT IN AERO: 1.0000 | INHALATION_SPRAY | RESPIRATORY_TRACT | Status: DC | PRN

## 2015-03-01 MED ORDER — SPIRONOLACTONE 12.5 MG HALF TABLET
12.5000 mg | ORAL_TABLET | Freq: Every day | ORAL | Status: DC
  Administered 2015-03-01 – 2015-03-02 (×2): 12.5 mg via ORAL
  Filled 2015-03-01 (×2): qty 1

## 2015-03-01 NOTE — H&P (Signed)
Paul Clayton Service Pager: [phone]  Patient name: Paul Clayton Medical record number: [id number] Date of birth: [birth year] Age: 69 y.o. Gender: male  Primary Care Provider: [name], MD Consultants: Cardiology  Code Status: Full  Chief Complaint: Chest pain  Assessment and Plan: Paul Clayton is a 69 y.o. male presenting with Chest pain here to rule out ACS . PMH is significant for NICM with systolic CHF, paroxysmal A fib, COPD, Colon Ca s/p resection, HCV, and substance abuse (in remission).   Chest pain - atypical chest pain with typical features (helped by nitro) with significant cardiac history, hemodynamically stable - Admit to telemetry for rule out ACS, consult cardiology - appreciate recommendations (called by ED) - Cath in Jan 2016 show no obstructive CAD - Trop slightly elevated at 0.04 but less than several days ago when it was 0.05, EKG unchnaged from previous - LDL 78 in 08/2014, TSH 1.7 in 4/16 - Continue nitro, asa, cored, xarelto, amio, digoxin, lipitor, and lisinopril - Cycle troponons and repeat EKG in am - Chest x ray   CHF - Appears weuvolemic, stable orthopnea - continue BB, Ace, and lasix - strict I/Os and daily weights  Afib - Sinus brady this am - Continue xarelto and BB - Change xarelto to heparin if rules in  COPD - Breathing easily, asymptomatic - Cntinue albuterol, dulera and Umecledinium (anticholinergic)  HCV - LFTs slightly elevated on 5/6- repeat today  Substance abuse - no substance abuse since establishing here, U tox negative since Jan 2016 - UDS  FEN/GI: Saline lock, heart healthy diet Prophylaxis: xarelto  Disposition: admit to tele for ACS rule out with chest pain  History of Present Illness: Paul Clayton is a 69 y.o. male presenting with chest pain that started about 9 hours ago. The patient describes that the pain woke him from sleep. It is described as  5-10 seconds of sharp stabbing pain followed by burning pain helped by nitro. He states that it actually improved slightly with standing and massage but is not reproducible on palpation. He is breathing well and has stable orthopnea. He recently had to leave the place he was living and is now living at a shelter. He went to class yesterday and forgot to take his medications yesterday because of house tired he was. He states that it seems like his abdomen swells when he gets fluid overloaded and it feels tight today.   Review Of Systems: Per HPI, otherwise 12 point review of systems was performed and was unremarkable.  Patient Active Problem List   Diagnosis Date Noted  . Pain in the chest 03/01/2015  . Paroxysmal atrial fibrillation 02/25/2015  . HF (heart failure) 11/10/2014  . Near syncope 11/09/2014  . Hx of substance abuse 10/30/2014  . SOB (shortness of breath)   . Acute decompensated heart failure   . Systolic CHF, chronic   . Atrial fibrillation with rapid ventricular response   . Faintness   . Chronic systolic heart failure   . Persistent atrial fibrillation   . Chronic systolic CHF (congestive heart failure) 10/17/2014  . Homeless single person   . Atrial fibrillation with RVR 10/13/2014  . High risk social situation 09/30/2014  . Colon cancer 09/26/2014  . Dyspnea 09/22/2014  . Chest pain 09/16/2014  . Syncope   . COPD exacerbation   . HFrEF (heart failure with reduced ejection fraction) 09/12/2014  . A-fib 09/12/2014  . COPD (chronic obstructive pulmonary disease)  09/12/2014   Past Medical History: Past Medical History  Diagnosis Date  . A-fib   . Hypertension   . CHF (congestive heart failure)   . COPD (chronic obstructive pulmonary disease)   . Hepatitis C   . Tobacco abuse   . Adenocarcinoma of colon     STAGE 1  . Dysrhythmia     hx of atrial fibrilation  . H/O ETOH abuse    Past Surgical History: Past Surgical History  Procedure Laterality Date  .  Cardioversion    . Colonoscopy    . Left and right heart catheterization with coronary angiogram N/A 10/23/2014    Procedure: LEFT AND RIGHT HEART CATHETERIZATION WITH CORONARY ANGIOGRAM;  Surgeon: Larey Dresser, MD;  Location: [facility] CATH LAB;  Service: Cardiovascular;  Laterality: N/A;  . Colonoscopy with propofol N/A 11/30/2014    Procedure: COLONOSCOPY WITH PROPOFOL;  Surgeon: Milus Banister, MD;  Location: [facility] ENDOSCOPY;  Service: Endoscopy;  Laterality: N/A;  . Cardioversion N/A 01/30/2015    Procedure: CARDIOVERSION;  Surgeon: Larey Dresser, MD;  Location: [facility] ENDOSCOPY;  Service: Cardiovascular;  Laterality: N/A;   Social History: History  Substance Use Topics  . Smoking status: Former Smoker    Quit date: 09/01/2014  . Smokeless tobacco: Never Used  . Alcohol Use: 0.0 oz/week    0 Standard drinks or equivalent per week     Comment: last use 01-16-15   Additional social history: Please also refer to relevant sections of EMR.  Family History: Family History  Problem Relation Age of Onset  . Hypertension Mother    Allergies and Medications: No Known Allergies No current facility-administered medications on file prior to encounter.   Current Outpatient Prescriptions on File Prior to Encounter  Medication Sig Dispense Refill  . albuterol-ipratropium (COMBIVENT) 18-103 MCG/ACT inhaler Inhale 1 puff into the lungs every 4 (four) hours as needed for wheezing or shortness of breath. 1 Inhaler 1  . amiodarone (PACERONE) 200 MG tablet Take 1 tablet (200 mg total) by mouth daily. 30 tablet 0  . atorvastatin (LIPITOR) 20 MG tablet TAKE 1 TABLET BY MOUTH DAILY 30 tablet 0  . carvedilol (COREG) 3.125 MG tablet Take 1 tablet (3.125 mg total) by mouth 2 (two) times daily with a meal. 60 tablet 3  . DIGOX 125 MCG tablet TAKE 1 TABLET BY MOUTH   DAILY 30 tablet 0  . furosemide (LASIX) 40 MG tablet Take 40 mg by mouth daily.    Marland Kitchen lisinopril (PRINIVIL,ZESTRIL) 5 MG tablet Take 2 tablets (10 mg  total) by mouth daily. 30 tablet 0  . mometasone-formoterol (DULERA) 200-5 MCG/ACT AERO Inhale 2 puffs into the lungs 2 (two) times daily as needed for wheezing. 13 g 2  . oxyCODONE-acetaminophen (PERCOCET/ROXICET) 5-325 MG per tablet Take 1-2 tablets by mouth every 6 (six) hours as needed for severe pain. 10 tablet 0  . rivaroxaban (XARELTO) 20 MG TABS tablet Take 1 tablet (20 mg total) by mouth daily with supper. 30 tablet 3  . spironolactone (ALDACTONE) 25 MG tablet Take 0.5 tablets (12.5 mg total) by mouth daily. 15 tablet 0  . Umeclidinium Bromide (INCRUSE ELLIPTA) 62.5 MCG/INH AEPB Inhale 1 Inhaler into the lungs daily. 30 each 3  . senna (SENOKOT) 8.6 MG TABS tablet Take 1 tablet (8.6 mg total) by mouth daily. (Patient not taking: Reported on 03/01/2015) 30 each 0    Objective: BP 119/68 mmHg  Pulse 77  Temp(Src) 98.1 F (36.7 C) (Oral)  Resp 22  SpO2 98% Exam: Gen: NAD, alert, cooperative with exam HEENT: NCAT, PERRL, mmm CV: slow but regular, good S1/S2, no murmur Resp: non labored, initial crackles which cleared with first deep inspiration on lower right lung field (suspect atelectasis) Abd: SNTND, BS present, no guarding or organomegaly Ext: No edema, 2+ DP pulses, no foot lesions, thickened toenails Neuro: Alert and oriented, strength 5/5 and sensation intact in BL upper and lower extremities    Labs and Imaging: CBC BMET   Recent Labs Lab 03/01/15 0613  WBC 6.9  HGB 12.8*  HCT 37.5*  PLT 190    Recent Labs Lab 03/01/15 0613  NA 137  K 3.8  CL 103  CO2 25  BUN 14  CREATININE 0.86  GLUCOSE 93  CALCIUM 9.0     EKG- Bradycardia, LVH with repol abnormality slightly more exaggerated than previous   Timmothy Euler, MD 03/01/2015, 8:53 AM PGY-3, [place] [facility] pager: [phone], text pages welcome

## 2015-03-01 NOTE — ED Notes (Signed)
Pt is calling the company to report that he has no batteries to request new ones.

## 2015-03-01 NOTE — ED Notes (Signed)
Per [facility]: pt woke up about 0300 with left sided chest pain, non radiating pain 10/10 described the pain as sharp, when [facility] arrive pt stated that pain was 0/10, so nitro was withheld. Pt given 324 mg of ASA by [facility].

## 2015-03-01 NOTE — ED Notes (Addendum)
Message left at [facility] asking if staff would be able to bring the pts life vest to the hospital as the pt was unable to reach his daughter.   [place] number, [phone]

## 2015-03-01 NOTE — Consult Note (Signed)
CARDIOLOGY CONSULT NOTE  Patient ID: Paul Clayton MRN: [id number] DOB/AGE: 1945-10-21 69 y.o.  Admit date: 03/01/2015 Primary Physician [name], MD Primary Cardiologist Dr. Marigene Ehlers Chief Complaint  Chest pain  HPI:   This is a 69 y.o. male with a past medical history of persistent atrial fibrillation s/p DCCV 01/28/2015 and chronic systolic CHF (LVEF 42%, severe MR, severe TR) who presents with chest pain.  Cath on 1/16 demonstrated LM2 20% stenosis, LAD 30% ostial stenosis, circumflex small vessel 30% stenosis, right coronary artery 30% stenosis. The EF was 20-25% with diffuse hypokinesis.  The patient woke this morning with chest discomfort. It was in his left chest. It was sharp. It radiated around to his left axilla. It was moderate in intensity. He had been "passing gas" at that point. There was no radiation to his jaw or to his arms. He was treated with aspirin and did have improvement after sublingual nitroglycerin. This was similar to previous symptoms. He's had little twinges of this this morning but otherwise has been walking in the hallway without complaints. He's had no new shortness of breath, PND or orthopnea. He does sleep with his head slightly propped.   Past Medical History  Diagnosis Date  . A-fib   . Hypertension   . CHF (congestive heart failure)   . COPD (chronic obstructive pulmonary disease)   . Hepatitis C   . Tobacco abuse   . Adenocarcinoma of colon     STAGE 1  . Dysrhythmia     hx of atrial fibrilation  . H/O ETOH abuse     Past Surgical History  Procedure Laterality Date  . Cardioversion    . Colonoscopy    . Left and right heart catheterization with coronary angiogram N/A 10/23/2014    Procedure: LEFT AND RIGHT HEART CATHETERIZATION WITH CORONARY ANGIOGRAM;  Surgeon: Larey Dresser, MD;  Location: [facility] CATH LAB;  Service: Cardiovascular;  Laterality: N/A;  . Colonoscopy with propofol N/A 11/30/2014    Procedure: COLONOSCOPY WITH PROPOFOL;   Surgeon: Milus Banister, MD;  Location: [facility] ENDOSCOPY;  Service: Endoscopy;  Laterality: N/A;  . Cardioversion N/A 01/30/2015    Procedure: CARDIOVERSION;  Surgeon: Larey Dresser, MD;  Location: [place];  Service: Cardiovascular;  Laterality: N/A;    No Known Allergies Prescriptions prior to admission  Medication Sig Dispense Refill Last Dose  . albuterol-ipratropium (COMBIVENT) 18-103 MCG/ACT inhaler Inhale 1 puff into the lungs every 4 (four) hours as needed for wheezing or shortness of breath. 1 Inhaler 1 02/28/2015 at Unknown time  . amiodarone (PACERONE) 200 MG tablet Take 1 tablet (200 mg total) by mouth daily. 30 tablet 0 02/28/2015 at 0800  . atorvastatin (LIPITOR) 20 MG tablet TAKE 1 TABLET BY MOUTH DAILY 30 tablet 0 02/28/2015 at Unknown time  . carvedilol (COREG) 3.125 MG tablet Take 1 tablet (3.125 mg total) by mouth 2 (two) times daily with a meal. 60 tablet 3 02/28/2015 at 0800  . DIGOX 125 MCG tablet TAKE 1 TABLET BY MOUTH   DAILY 30 tablet 0 02/28/2015 at Unknown time  . furosemide (LASIX) 40 MG tablet Take 40 mg by mouth daily.   02/28/2015 at Unknown time  . lisinopril (PRINIVIL,ZESTRIL) 5 MG tablet Take 2 tablets (10 mg total) by mouth daily. 30 tablet 0 02/28/2015 at Unknown time  . mometasone-formoterol (DULERA) 200-5 MCG/ACT AERO Inhale 2 puffs into the lungs 2 (two) times daily as needed for wheezing. 13 g 2 02/28/2015 at Unknown time  .  oxyCODONE-acetaminophen (PERCOCET/ROXICET) 5-325 MG per tablet Take 1-2 tablets by mouth every 6 (six) hours as needed for severe pain. 10 tablet 0 Past Week at Unknown time  . rivaroxaban (XARELTO) 20 MG TABS tablet Take 1 tablet (20 mg total) by mouth daily with supper. 30 tablet 3 02/28/2015 at 0800  . spironolactone (ALDACTONE) 25 MG tablet Take 0.5 tablets (12.5 mg total) by mouth daily. 15 tablet 0 02/28/2015  . Umeclidinium Bromide (INCRUSE ELLIPTA) 62.5 MCG/INH AEPB Inhale 1 Inhaler into the lungs daily. 30 each 3 02/27/2015  . senna  (SENOKOT) 8.6 MG TABS tablet Take 1 tablet (8.6 mg total) by mouth daily. (Patient not taking: Reported on 03/01/2015) 30 each 0 Not Taking at Unknown time   Family History  Problem Relation Age of Onset  . Hypertension Mother     History   Social History  . Marital Status: Widowed    Spouse Name: N/A  . Number of Children: N/A  . Years of Education: N/A   Occupational History  . Not on file.   Social History Main Topics  . Smoking status: Former Smoker    Quit date: 09/01/2014  . Smokeless tobacco: Never Used  . Alcohol Use: 0.0 oz/week    0 Standard drinks or equivalent per week     Comment: last use 01-16-15  . Drug Use: Yes    Special: Cocaine     Comment: last use 01-16-15  . Sexual Activity: Not Currently    Birth Control/ Protection: Abstinence   Other Topics Concern  . Not on file   Social History Narrative     ROS:    As stated in the HPI and negative for all other systems.  Physical Exam: Blood pressure 119/68, pulse 77, temperature 98.1 F (36.7 C), temperature source Oral, resp. rate 22, SpO2 98 %.  GENERAL:  Well appearing HEENT:  Pupils equal round and reactive, fundi not visualized, oral mucosa unremarkable NECK:  No jugular venous distention, waveform within normal limits, carotid upstroke brisk and symmetric, no bruits, no thyromegaly LYMPHATICS:  No cervical, inguinal adenopathy LUNGS:  Clear to auscultation bilaterally BACK:  No CVA tenderness CHEST:  Unremarkable HEART:  PMI not displaced or sustained,S1 and S2 within normal limits, no S3, no S4, no clicks, no rubs, no murmurs ABD:  Flat, positive bowel sounds normal in frequency in pitch, no bruits, no rebound, no guarding, no midline pulsatile mass, no hepatomegaly, no splenomegaly EXT:  2 plus pulses throughout, no edema, no cyanosis no clubbing SKIN:  No rashes no nodules NEURO:  Cranial nerves II through XII grossly intact, motor grossly intact throughout PSYCH:  Cognitively intact, oriented  to person place and time  Labs: Lab Results  Component Value Date   BUN 14 03/01/2015   Lab Results  Component Value Date   CREATININE 0.86 03/01/2015   Lab Results  Component Value Date   NA 137 03/01/2015   K 3.8 03/01/2015   CL 103 03/01/2015   CO2 25 03/01/2015   Lab Results  Component Value Date   TROPONINI 0.04* 03/01/2015   Lab Results  Component Value Date   WBC 6.9 03/01/2015   HGB 12.8* 03/01/2015   HCT 37.5* 03/01/2015   MCV 96.4 03/01/2015   PLT 190 03/01/2015    Lab Results  Component Value Date   ALT 93* 02/23/2015   AST 104* 02/23/2015   ALKPHOS 65 02/23/2015   BILITOT 0.7 02/23/2015    Radiology:   CXR: There is no  edema or consolidation. Heart size and pulmonary vascularity are normal. No adenopathy. No bone lesions.  SYV:GCYOYO sinus rhythm, rate 51, LVH by voltage criteria, repolarization changes, no change from previous.  03/01/2015  ASSESSMENT AND PLAN:   CHEST PAIN:  The patient had non obstructive CAD on Cath a few months ago.  Troponin is minimally elevated and not clinically relevant in this situation. The pain did improve with nitroglycerin but this is not a diagnostic test that can determine the etiology of the discomfort.  At this point no further workup is indicated.  ATRIAL FIB:  The patient seems to be maintaining sinus rhythm. No change in therapy is indicated.  NON ISCHEMIC CM:  He seems to be euvolemic. No change in therapy is indicated.  Of note he was given an external defibrillator vest to wear. However, because he is homeless and his social situation precarious he has not been wearing this.  He remains on amiodarone and is being considered for an ICD.    SignedMinus Breeding 03/01/2015, 9:45 AM

## 2015-03-01 NOTE — Discharge Instructions (Addendum)
Information on my medicine - XARELTO (Rivaroxaban)  This medication education was reviewed with me or my healthcare representative as part of my discharge preparation.  The pharmacist that spoke with me during my hospital stay was:  Dareen Piano, [facility]  Why was Xarelto prescribed for you? Xarelto was prescribed for you to reduce the risk of a blood clot forming that can cause a stroke if you have a medical condition called atrial fibrillation (a type of irregular heartbeat).  What do you need to know about xarelto ? Take your Xarelto ONCE DAILY at the same time every day with your evening meal. If you have difficulty swallowing the tablet whole, you may crush it and mix in applesauce just prior to taking your dose.  Take Xarelto exactly as prescribed by your doctor and DO NOT stop taking Xarelto without talking to the doctor who prescribed the medication.  Stopping without other stroke prevention medication to take the place of Xarelto may increase your risk of developing a clot that causes a stroke.  Refill your prescription before you run out.  After discharge, you should have regular check-up appointments with your healthcare provider that is prescribing your Xarelto.  In the future your dose may need to be changed if your kidney function or weight changes by a significant amount.  What do you do if you miss a dose? If you are taking Xarelto ONCE DAILY and you miss a dose, take it as soon as you remember on the same day then continue your regularly scheduled once daily regimen the next day. Do not take two doses of Xarelto at the same time or on the same day.   Important Safety Information A possible side effect of Xarelto is bleeding. You should call your healthcare provider right away if you experience any of the following: ? Bleeding from an injury or your nose that does not stop. ? Unusual colored urine (red or dark brown) or unusual colored stools (red or  black). ? Unusual bruising for unknown reasons. ? A serious fall or if you hit your head (even if there is no bleeding).  Some medicines may interact with Xarelto and might increase your risk of bleeding while on Xarelto. To help avoid this, consult your healthcare provider or pharmacist prior to using any new prescription or non-prescription medications, including herbals, vitamins, non-steroidal anti-inflammatory drugs (NSAIDs) and supplements.  This website has more information on Xarelto: [email or web address]   Heart Failure Heart failure is a condition in which the heart has trouble pumping blood. This means your heart does not pump blood efficiently for your body to work well. In some cases of heart failure, fluid may back up into your lungs or you may have swelling (edema) in your lower legs. Heart failure is usually a long-term (chronic) condition. It is important for you to take good care of yourself and follow your health care provider's treatment plan. CAUSES  Some health conditions can cause heart failure. Those health conditions include:  High blood pressure (hypertension). Hypertension causes the heart muscle to work harder than normal. When pressure in the blood vessels is high, the heart needs to pump (contract) with more force in order to circulate blood throughout the body. High blood pressure eventually causes the heart to become stiff and weak.  Coronary artery disease (CAD). CAD is the buildup of cholesterol and fat (plaque) in the arteries of the heart. The blockage in the arteries deprives the heart muscle of oxygen and blood. This can  cause chest pain and may lead to a heart attack. High blood pressure can also contribute to CAD.  Heart attack (myocardial infarction). A heart attack occurs when one or more arteries in the heart become blocked. The loss of oxygen damages the muscle tissue of the heart. When this happens, part of the heart muscle dies. The injured tissue does  not contract as well and weakens the heart's ability to pump blood.  Abnormal heart valves. When the heart valves do not open and close properly, it can cause heart failure. This makes the heart muscle pump harder to keep the blood flowing.  Heart muscle disease (cardiomyopathy or myocarditis). Heart muscle disease is damage to the heart muscle from a variety of causes. These can include drug or alcohol abuse, infections, or unknown reasons. These can increase the risk of heart failure.  Lung disease. Lung disease makes the heart work harder because the lungs do not work properly. This can cause a strain on the heart, leading it to fail.  Diabetes. Diabetes increases the risk of heart failure. High blood sugar contributes to high fat (lipid) levels in the blood. Diabetes can also cause slow damage to tiny blood vessels that carry important nutrients to the heart muscle. When the heart does not get enough oxygen and food, it can cause the heart to become weak and stiff. This leads to a heart that does not contract efficiently.  Other conditions can contribute to heart failure. These include abnormal heart rhythms, thyroid problems, and low blood counts (anemia). Certain unhealthy behaviors can increase the risk of heart failure, including:  Being overweight.  Smoking or chewing tobacco.  Eating foods high in fat and cholesterol.  Abusing illicit drugs or alcohol.  Lacking physical activity. SYMPTOMS  Heart failure symptoms may vary and can be hard to detect. Symptoms may include:  Shortness of breath with activity, such as climbing stairs.  Persistent cough.  Swelling of the feet, ankles, legs, or abdomen.  Unexplained weight gain.  Difficulty breathing when lying flat (orthopnea).  Waking from sleep because of the need to sit up and get more air.  Rapid heartbeat.  Fatigue and loss of energy.  Feeling light-headed, dizzy, or close to fainting.  Loss of  appetite.  Nausea.  Increased urination during the night (nocturia). DIAGNOSIS  A diagnosis of heart failure is based on your history, symptoms, physical examination, and diagnostic tests. Diagnostic tests for heart failure may include:  Echocardiography.  Electrocardiography.  Chest X-ray.  Blood tests.  Exercise stress test.  Cardiac angiography.  Radionuclide scans. TREATMENT  Treatment is aimed at managing the symptoms of heart failure. Medicines, behavioral changes, or surgical intervention may be necessary to treat heart failure.  Medicines to help treat heart failure may include:  Angiotensin-converting enzyme (ACE) inhibitors. This type of medicine blocks the effects of a blood protein called angiotensin-converting enzyme. ACE inhibitors relax (dilate) the blood vessels and help lower blood pressure.  Angiotensin receptor blockers (ARBs). This type of medicine blocks the actions of a blood protein called angiotensin. Angiotensin receptor blockers dilate the blood vessels and help lower blood pressure.  Water pills (diuretics). Diuretics cause the kidneys to remove salt and water from the blood. The extra fluid is removed through urination. This loss of extra fluid lowers the volume of blood the heart pumps.  Beta blockers. These prevent the heart from beating too fast and improve heart muscle strength.  Digitalis. This increases the force of the heartbeat.  Healthy behavior changes  include:  Obtaining and maintaining a healthy weight.  Stopping smoking or chewing tobacco.  Eating heart-healthy foods.  Limiting or avoiding alcohol.  Stopping illicit drug use.  Physical activity as directed by your health care provider.  Surgical treatment for heart failure may include:  A procedure to open blocked arteries, repair damaged heart valves, or remove damaged heart muscle tissue.  A pacemaker to improve heart muscle function and control certain abnormal heart  rhythms.  An internal cardioverter defibrillator to treat certain serious abnormal heart rhythms.  A left ventricular assist device (LVAD) to assist the pumping ability of the heart. HOME CARE INSTRUCTIONS   Take medicines only as directed by your health care provider. Medicines are important in reducing the workload of your heart, slowing the progression of heart failure, and improving your symptoms.  Do not stop taking your medicine unless directed by your health care provider.  Do not skip any dose of medicine.  Refill your prescriptions before you run out of medicine. Your medicines are needed every day.  Engage in moderate physical activity if directed by your health care provider. Moderate physical activity can benefit some people. The elderly and people with severe heart failure should consult with a health care provider for physical activity recommendations.  Eat heart-healthy foods. Food choices should be free of trans fat and low in saturated fat, cholesterol, and salt (sodium). Healthy choices include fresh or frozen fruits and vegetables, fish, lean meats, legumes, fat-free or low-fat dairy products, and whole grain or high fiber foods. Talk to a dietitian to learn more about heart-healthy foods.  Limit sodium if directed by your health care provider. Sodium restriction may reduce symptoms of heart failure in some people. Talk to a dietitian to learn more about heart-healthy seasonings.  Use healthy cooking methods. Healthy cooking methods include roasting, grilling, broiling, baking, poaching, steaming, or stir-frying. Talk to a dietitian to learn more about healthy cooking methods.  Limit fluids if directed by your health care provider. Fluid restriction may reduce symptoms of heart failure in some people.  Weigh yourself every day. Daily weights are important in the early recognition of excess fluid. You should weigh yourself every morning after you urinate and before you eat  breakfast. Wear the same amount of clothing each time you weigh yourself. Record your daily weight. Provide your health care provider with your weight record.  Monitor and record your blood pressure if directed by your health care provider.  Check your pulse if directed by your health care provider.  Lose weight if directed by your health care provider. Weight loss may reduce symptoms of heart failure in some people.  Stop smoking or chewing tobacco. Nicotine makes your heart work harder by causing your blood vessels to constrict. Do not use nicotine gum or patches before talking to your health care provider.  Keep all follow-up visits as directed by your health care provider. This is important.  Limit alcohol intake to no more than 1 drink per day for nonpregnant women and 2 drinks per day for men. One drink equals 12 ounces of beer, 5 ounces of wine, or 1 ounces of hard liquor. Drinking more than that is harmful to your heart. Tell your health care provider if you drink alcohol several times a week. Talk with your health care provider about whether alcohol is safe for you. If your heart has already been damaged by alcohol or you have severe heart failure, drinking alcohol should be stopped completely.  Stop illicit  drug use.  Stay up-to-date with immunizations. It is especially important to prevent respiratory infections through current pneumococcal and influenza immunizations.  Manage other health conditions such as hypertension, diabetes, thyroid disease, or abnormal heart rhythms as directed by your health care provider.  Learn to manage stress.  Plan rest periods when fatigued.  Learn strategies to manage high temperatures. If the weather is extremely hot:  Avoid vigorous physical activity.  Use air conditioning or fans or seek a cooler location.  Avoid caffeine and alcohol.  Wear loose-fitting, lightweight, and light-colored clothing.  Learn strategies to manage cold  temperatures. If the weather is extremely cold:  Avoid vigorous physical activity.  Layer clothes.  Wear mittens or gloves, a hat, and a scarf when going outside.  Avoid alcohol.  Obtain ongoing education and support as needed.  Participate in or seek rehabilitation as needed to maintain or improve independence and quality of life. SEEK MEDICAL CARE IF:   Your weight increases by 03 lb/1.4 kg in 1 day or 05 lb/2.3 kg in a week.  You have increasing shortness of breath that is unusual for you.  You are unable to participate in your usual physical activities.  You tire easily.  You cough more than normal, especially with physical activity.  You have any or more swelling in areas such as your hands, feet, ankles, or abdomen.  You are unable to sleep because it is hard to breathe.  You feel like your heart is beating fast (palpitations).  You become dizzy or light-headed upon standing up. SEEK IMMEDIATE MEDICAL CARE IF:   You have difficulty breathing.  There is a change in mental status such as decreased alertness or difficulty with concentration.  You have a pain or discomfort in your chest.  You have an episode of fainting (syncope). MAKE SURE YOU:   Understand these instructions.  Will watch your condition.  Will get help right away if you are not doing well or get worse. Document Released: 10/06/2005 Document Revised: 02/20/2014 Document Reviewed: 11/05/2012 [facility] Patient Information 2015 [place], [place]. This information is not intended to replace advice given to you by your health care provider. Make sure you discuss any questions you have with your health care provider.   We are glad that you are feeling better it is very important that you make your Cardiology appointments.   You have a Cardiology appointment on 5/16, 5/18, 6/06. These are on the paperwork that you already have.   You have an appointment with Dr. Berkley Harvey for follow up on 6/10.   Thanks  for letting us take care of you.   Sincerely,  Paula Compton, MD Family Medicine - PGY 1

## 2015-03-01 NOTE — ED Notes (Addendum)
Pt states that he has not been hooking his life vest up to send the information to the company in about 2 weeks because he has not had the access or resources available to do so.

## 2015-03-01 NOTE — ED Notes (Signed)
Discussed nitro tab for chest pain, patient reports he does not need at this time.

## 2015-03-01 NOTE — ED Notes (Signed)
Pt has called [employer] about his life vest. They will be speaking to leadership about bringing the pt a new battery. They are hesitant about bringing an entire new set for the pt due to cost and the pts non compliance. Pt has left his life vest at the shelter that he stays at. Pt was encouraged by this RN to call his estranged daughter in attempt to try and bring his life vest to the hospital. This RN stressed to the pt the importance of wearing his life vest at all times as instructed.

## 2015-03-01 NOTE — ED Provider Notes (Signed)
CSN: [id number]     Arrival date & time 03/01/15  0410 History   First MD Initiated Contact with Patient 03/01/15 0547     Chief Complaint  Patient presents with  . Chest Pain     (Consider location/radiation/quality/duration/timing/severity/associated sxs/prior Treatment) Patient is a 69 y.o. male presenting with chest pain. The history is provided by the patient.  Chest Pain He was awakened at 2:30 AM with severe, sharp pain in his chest without radiation. There is associated dyspnea, nausea, vomiting, diaphoresis. Since then, pain has waxed and waned. It is somewhat worse with a deep breath but does not seem to be affected by body position or movement. He rates pain currently at 3/10 but it was 9/10 when he was awakened. He has had pains like that in the past but cannot tell me what the diagnosis was. He does have a history of nonischemic cardiomyopathy and atrial fibrillation. He is supposed to be wearing a LifeVest, but he left it at a shelter where he stays and apparently is in need of new batteries. He does have history of polysubstance abuse. He admits to smoking a cigarette every couple of weeks and still occasionally will consume alcohol. He admits to drinking a 40 ounce beer 5 days ago. He denies cocaine use.  Past Medical History  Diagnosis Date  . A-fib   . Hypertension   . CHF (congestive heart failure)   . COPD (chronic obstructive pulmonary disease)   . Hepatitis C   . Tobacco abuse   . Adenocarcinoma of colon     STAGE 1  . Dysrhythmia     hx of atrial fibrilation  . H/O ETOH abuse    Past Surgical History  Procedure Laterality Date  . Cardioversion    . Colonoscopy    . Left and right heart catheterization with coronary angiogram N/A 10/23/2014    Procedure: LEFT AND RIGHT HEART CATHETERIZATION WITH CORONARY ANGIOGRAM;  Surgeon: Larey Dresser, MD;  Location: [facility] CATH LAB;  Service: Cardiovascular;  Laterality: N/A;  . Colonoscopy with propofol N/A 11/30/2014     Procedure: COLONOSCOPY WITH PROPOFOL;  Surgeon: Milus Banister, MD;  Location: [facility] ENDOSCOPY;  Service: Endoscopy;  Laterality: N/A;  . Cardioversion N/A 01/30/2015    Procedure: CARDIOVERSION;  Surgeon: Larey Dresser, MD;  Location: [facility] ENDOSCOPY;  Service: Cardiovascular;  Laterality: N/A;   Family History  Problem Relation Age of Onset  . Hypertension Mother    History  Substance Use Topics  . Smoking status: Former Smoker    Quit date: 09/01/2014  . Smokeless tobacco: Never Used  . Alcohol Use: 0.0 oz/week    0 Standard drinks or equivalent per week     Comment: last use 01-16-15    Review of Systems  Cardiovascular: Positive for chest pain.  All other systems reviewed and are negative.     Allergies  Review of patient's allergies indicates no known allergies.  Home Medications   Prior to Admission medications   Medication Sig Start Date End Date Taking? Authorizing Provider  albuterol-ipratropium (COMBIVENT) 18-103 MCG/ACT inhaler Inhale 1 puff into the lungs every 4 (four) hours as needed for wheezing or shortness of breath. 09/12/14   Olam Idler, MD  amiodarone (PACERONE) 200 MG tablet Take 1 tablet (200 mg total) by mouth daily. 02/23/15   Larey Dresser, MD  atorvastatin (LIPITOR) 20 MG tablet TAKE 1 TABLET BY MOUTH DAILY 02/21/15   Larey Dresser, MD  carvedilol (COREG) 3.125  MG tablet Take 1 tablet (3.125 mg total) by mouth 2 (two) times daily with a meal. 02/23/15   Larey Dresser, MD  [place] 125 MCG tablet TAKE 1 TABLET BY MOUTH   DAILY 02/21/15   Larey Dresser, MD  furosemide (LASIX) 40 MG tablet Take 40 mg by mouth daily. 12/01/14   Historical Provider, MD  lisinopril (PRINIVIL,ZESTRIL) 5 MG tablet Take 2 tablets (10 mg total) by mouth daily. 02/23/15   Larey Dresser, MD  mometasone-formoterol [facility]) 200-5 MCG/ACT AERO Inhale 2 puffs into the lungs 2 (two) times daily as needed for wheezing. 01/06/15   Leone Brand, MD  oxyCODONE-acetaminophen  (PERCOCET/ROXICET) 5-325 MG per tablet Take 1-2 tablets by mouth every 6 (six) hours as needed for severe pain. 02/23/15   Glendell Docker, NP  rivaroxaban (XARELTO) 20 MG TABS tablet Take 1 tablet (20 mg total) by mouth daily with supper. 11/21/14   Larey Dresser, MD  senna (SENOKOT) 8.6 MG TABS tablet Take 1 tablet (8.6 mg total) by mouth daily. 10/28/14   [place] N Rumley, DO  spironolactone (ALDACTONE) 25 MG tablet Take 0.5 tablets (12.5 mg total) by mouth daily. 11/28/14   Jennifer Piepenbrink, PA-C  Umeclidinium Bromide (INCRUSE ELLIPTA) 62.5 MCG/INH AEPB Inhale 1 Inhaler into the lungs daily. 02/20/15   Olam Idler, MD   BP 145/89 mmHg  Pulse 55  Temp(Src) 98.1 F (36.7 C) (Oral)  Resp 13  SpO2 99% Physical Exam  Nursing note and vitals reviewed.  69 year old male, resting comfortably and in no acute distress. Vital signs are significant for hypertension and bradycardia. Oxygen saturation is 99%, which is normal. Head is normocephalic and atraumatic. PERRLA, EOMI. Oropharynx is clear. Neck is nontender and supple without adenopathy or JVD. Back is nontender and there is no CVA tenderness. Lungs are clear without rales, wheezes, or rhonchi. Chest is nontender. Heart has regular rate and rhythm without murmur. Abdomen is soft, flat, nontender without masses or hepatosplenomegaly and peristalsis is normoactive. Extremities have no cyanosis or edema, full range of motion is present. Skin is warm and dry without rash. Neurologic: Mental status is normal, cranial nerves are intact, there are no motor or sensory deficits.  ED Course  Procedures (including critical care time) Labs Review Results for orders placed or performed during the hospital encounter of 64/33/29  Basic metabolic panel  Result Value Ref Range   Sodium 137 135 - 145 mmol/L   Potassium 3.8 3.5 - 5.1 mmol/L   Chloride 103 101 - 111 mmol/L   CO2 25 22 - 32 mmol/L   Glucose, Bld 93 65 - 99 mg/dL   BUN 14 6 - 20 mg/dL    Creatinine, Ser 0.86 0.61 - 1.24 mg/dL   Calcium 9.0 8.9 - 10.3 mg/dL   GFR calc non Af Amer >60 >60 mL/min   GFR calc Af Amer >60 >60 mL/min   Anion gap 9 5 - 15  D-dimer, quantitative  Result Value Ref Range   D-Dimer, Quant <0.27 0.00 - 0.48 ug/mL-FEU  Troponin I  Result Value Ref Range   Troponin I 0.04 (H) <0.031 ng/mL  CBC with Differential  Result Value Ref Range   WBC 6.9 4.0 - 10.5 K/uL   RBC 3.89 (L) 4.22 - 5.81 MIL/uL   Hemoglobin 12.8 (L) 13.0 - 17.0 g/dL   HCT 37.5 (L) 39.0 - 52.0 %   MCV 96.4 78.0 - 100.0 fL   MCH 32.9 26.0 - 34.0 pg  MCHC 34.1 30.0 - 36.0 g/dL   RDW 12.4 11.5 - 15.5 %   Platelets 190 150 - 400 K/uL   Neutrophils Relative % 56 43 - 77 %   Neutro Abs 3.8 1.7 - 7.7 K/uL   Lymphocytes Relative 28 12 - 46 %   Lymphs Abs 1.9 0.7 - 4.0 K/uL   Monocytes Relative 12 3 - 12 %   Monocytes Absolute 0.8 0.1 - 1.0 K/uL   Eosinophils Relative 4 0 - 5 %   Eosinophils Absolute 0.3 0.0 - 0.7 K/uL   Basophils Relative 0 0 - 1 %   Basophils Absolute 0.0 0.0 - 0.1 K/uL  I-stat troponin, ED  (not at [facility])  Result Value Ref Range   Troponin i, poc 0.03 0.00 - 0.08 ng/mL   Comment 3            EKG Interpretation   Date/Time:  Thursday Mar 01 2015 04:15:54 EDT Ventricular Rate:  51 PR Interval:  183 QRS Duration: 97 QT Interval:  442 QTC Calculation: 407 R Axis:   65 Text Interpretation:  Sinus rhythm LVH with secondary repolarization  abnormality Anterior ST elevation, probably due to LVH When compared with  ECG of 12/24/2014, HEART RATE has decreased Confirmed by [facility]  MD, Gisella Alwine  ([id number]) on 03/01/2015 4:21:49 AM      MDM   Final diagnoses:  Chest pain, unspecified chest pain type  Elevated troponin    Chest pain of uncertain cause. ECG is unchanged from baseline. He will be given nitroglycerin and will be sent for chest x-ray. Screening labs are obtained. Review of old records confirms diagnosis of nonischemic cardiomyopathy and atrial  fibrillation which was treated with cardioversion.  Workup is significant for elevated troponin of 0.04. This may represent demand ischemia related to his heart failure. Decision is made not 20 coagulating based on this elevated troponin. D-dimer is come back normal. Case is discussed with Dr. Romona Curls of family practice service who agrees to admit the patient. Cardiology has also been consulted.  Delora Fuel, MD 01/01/93 [id number]

## 2015-03-02 DIAGNOSIS — R0789 Other chest pain: Secondary | ICD-10-CM | POA: Diagnosis not present

## 2015-03-02 DIAGNOSIS — J438 Other emphysema: Secondary | ICD-10-CM | POA: Diagnosis not present

## 2015-03-02 DIAGNOSIS — R079 Chest pain, unspecified: Secondary | ICD-10-CM | POA: Diagnosis not present

## 2015-03-02 DIAGNOSIS — I5022 Chronic systolic (congestive) heart failure: Secondary | ICD-10-CM | POA: Diagnosis not present

## 2015-03-02 DIAGNOSIS — I509 Heart failure, unspecified: Secondary | ICD-10-CM | POA: Diagnosis not present

## 2015-03-02 DIAGNOSIS — I480 Paroxysmal atrial fibrillation: Secondary | ICD-10-CM | POA: Diagnosis not present

## 2015-03-02 NOTE — Discharge Summary (Signed)
[facility] Discharge Summary  Patient name: Paul Clayton Medical record number: [id number] Date of birth: [birth year] Age: 69 y.o. Gender: male Date of Admission: 03/01/2015  Date of Discharge: 03/02/2015   Admitting Physician: Paul Feil, MD  Primary Care Provider: [name], MD Consultants: Cardiology  Indication for Hospitalization: Chest Pain Rule Out ACS  Discharge Diagnoses/Problem List:  NICM Systolic CHF NYH II-III B Paroxysmal A Fib COPD Colon Ca s/p resection HCV Polysubstance Abuse (in remission)  Disposition: Discharge to [facility])  Discharge Condition: Stable  Discharge Exam:  Gen: NAD, alert, cooperative with exam HEENT: NCAT, MMM CV: Bradycardic RR, good S1/S2, no murmur, Life Vest In Place Resp: CTA BL, No crackles, No rales, Appropriate Rate, Unlabored.  Abd: SNTND, BS present, no guarding or organomegaly Ext: No edema, 2+ DP pulses, MAEW Skin: No rashes, no lesions.   Brief Hospital Course:  Paul Clayton is a 69 y.o. male presenting with Chest pain here to rule out ACS . PMH is significant for NICM with systolic CHF, paroxysmal A fib, COPD, Colon Ca s/p resection, HCV, and substance abuse (in remission).    He came to the ED complaining of chest pain with few typical and mostly atypical features. With his significant cardiac history, he was worked up further. Troponins were only minimally elevated and actually trended down during his hospitalization. EKG remained unchanged and in normal sinus rhythm. He did not go into atrial fibrillation during his hospitalization. Had a cardiac Cath in 10/2014 with no obstructive CAD. Has previously had risk stratification lab workup. He was continued on nitro prn, ASA, his home Coreg, xarelto for atrial fibrillation, amiodarone, digoxin, lipitor, and lisinopril. His last Echocardiogram was significant for EF of 15%. He has a life vest. He admitted to intermittent  noncompliance with his life vest due to his social situation. He is currently being worked up for ICD placement and has Cardiology appointments already in place for this. He experienced no other medical problems during this hospitalization and was found to be safe for discharge to home with close follow up.    Issues for Follow Up:  1. Follow up compliance with Life Vest / continued ICD workup.  2. Follow up social situation and housing placement.  3. Follow up general medical compliance.  4. Follow up cardiac symptoms, chest pain / SOB for resolution / improvement.   Significant Procedures: None  Significant Labs and Imaging:   Recent Labs Lab 03/01/15 0613  WBC 6.9  HGB 12.8*  HCT 37.5*  PLT 190    Recent Labs Lab 03/01/15 0613 03/01/15 1234 03/01/15 1449  NA 137 SPECIMEN CONTAMINATED WITH EDTA. CALLED TO FORD,A RN @1347  5.12.16 BY GRINSTEAD,C 138  K 3.8  --  4.1  CL 103  --  105  CO2 25  --  27  GLUCOSE 93  --  96  BUN 14  --  10  CREATININE 0.86  --  0.98  CALCIUM 9.0  --  8.8*  ALKPHOS  --   --  53  AST  --   --  96*  ALT  --   --  99*  ALBUMIN  --   --  3.2*    EKG- Bradycardia, LVH with repol abnormality slightly more exaggerated than previous  Results/Tests Pending at Time of Discharge: None  Discharge Medications:    Medication List    TAKE these medications        albuterol-ipratropium 18-103 MCG/ACT inhaler  Commonly known as:  COMBIVENT  Inhale 1 puff into the lungs every 4 (four) hours as needed for wheezing or shortness of breath.     amiodarone 200 MG tablet  Commonly known as:  PACERONE  Take 1 tablet (200 mg total) by mouth daily.     atorvastatin 20 MG tablet  Commonly known as:  LIPITOR  TAKE 1 TABLET BY MOUTH DAILY     carvedilol 3.125 MG tablet  Commonly known as:  COREG  Take 1 tablet (3.125 mg total) by mouth 2 (two) times daily with a meal.     DIGOX 0.125 MG tablet  Generic drug:  digoxin  TAKE 1 TABLET BY MOUTH   DAILY      furosemide 40 MG tablet  Commonly known as:  LASIX  Take 40 mg by mouth daily.     lisinopril 5 MG tablet  Commonly known as:  PRINIVIL,ZESTRIL  Take 2 tablets (10 mg total) by mouth daily.     mometasone-formoterol 200-5 MCG/ACT Aero  Commonly known as:  DULERA  Inhale 2 puffs into the lungs 2 (two) times daily as needed for wheezing.     oxyCODONE-acetaminophen 5-325 MG per tablet  Commonly known as:  PERCOCET/ROXICET  Take 1-2 tablets by mouth every 6 (six) hours as needed for severe pain.     rivaroxaban 20 MG Tabs tablet  Commonly known as:  XARELTO  Take 1 tablet (20 mg total) by mouth daily with supper.     senna 8.6 MG Tabs tablet  Commonly known as:  SENOKOT  Take 1 tablet (8.6 mg total) by mouth daily.     spironolactone 25 MG tablet  Commonly known as:  ALDACTONE  Take 0.5 tablets (12.5 mg total) by mouth daily.     Umeclidinium Bromide 62.5 MCG/INH Aepb  Commonly known as:  INCRUSE ELLIPTA  Inhale 1 Inhaler into the lungs daily.        Discharge Instructions: Please refer to Patient Instructions section of EMR for full details.  Patient was counseled important signs and symptoms that should prompt return to medical care, changes in medications, dietary instructions, activity restrictions, and follow up appointments.   Follow-Up Appointments: Follow-up Information    Follow up with Paul Myron, MD. Go on 03/30/2015.   Specialty:  Family Medicine   Why:  For Hospital Follow Up - 8:30 am.    Contact information:   [place] [address] [phone]       Aquilla Hacker, MD 03/02/2015, 12:25 PM PGY-1, [address]

## 2015-03-02 NOTE — Progress Notes (Signed)
IV and tele monitor d/c at this time; pt given d/c instructions; pt to d/c to homeless shelter where he was previously living; will cont. To monitor.

## 2015-03-05 ENCOUNTER — Ambulatory Visit (INDEPENDENT_AMBULATORY_CARE_PROVIDER_SITE_OTHER): Payer: Medicare Other | Admitting: Physician Assistant

## 2015-03-05 ENCOUNTER — Encounter: Payer: Self-pay | Admitting: Physician Assistant

## 2015-03-05 VITALS — BP 114/60 | HR 67 | Ht 72.0 in | Wt 200.4 lb

## 2015-03-05 DIAGNOSIS — I480 Paroxysmal atrial fibrillation: Secondary | ICD-10-CM | POA: Diagnosis not present

## 2015-03-05 DIAGNOSIS — R072 Precordial pain: Secondary | ICD-10-CM

## 2015-03-05 DIAGNOSIS — Z7289 Other problems related to lifestyle: Secondary | ICD-10-CM | POA: Diagnosis not present

## 2015-03-05 DIAGNOSIS — Z609 Problem related to social environment, unspecified: Secondary | ICD-10-CM

## 2015-03-05 DIAGNOSIS — I428 Other cardiomyopathies: Secondary | ICD-10-CM

## 2015-03-05 DIAGNOSIS — I5022 Chronic systolic (congestive) heart failure: Secondary | ICD-10-CM | POA: Diagnosis not present

## 2015-03-05 DIAGNOSIS — I429 Cardiomyopathy, unspecified: Secondary | ICD-10-CM | POA: Diagnosis not present

## 2015-03-05 LAB — BASIC METABOLIC PANEL
BUN: 14 mg/dL (ref 6–23)
CHLORIDE: 102 meq/L (ref 96–112)
CO2: 32 meq/L (ref 19–32)
CREATININE: 0.91 mg/dL (ref 0.40–1.50)
Calcium: 9.3 mg/dL (ref 8.4–10.5)
GFR: 106.67 mL/min (ref >60.00)
Glucose, Bld: 98 mg/dL (ref 70–99)
Potassium: 4 mEq/L (ref 3.5–5.1)
SODIUM: 136 meq/L (ref 135–145)

## 2015-03-05 LAB — HEPATIC FUNCTION PANEL
ALBUMIN: 3.6 g/dL (ref 3.5–5.2)
ALK PHOS: 59 U/L (ref 39–117)
ALT: 81 U/L — ABNORMAL HIGH (ref 0–53)
AST: 67 U/L — ABNORMAL HIGH (ref 0–37)
BILIRUBIN DIRECT: 0.2 mg/dL (ref 0.0–0.3)
Total Bilirubin: 0.6 mg/dL (ref 0.2–1.2)
Total Protein: 6.9 g/dL (ref 6.0–8.3)

## 2015-03-05 LAB — MAGNESIUM: MAGNESIUM: 2.1 mg/dL (ref 1.5–2.5)

## 2015-03-05 NOTE — Assessment & Plan Note (Signed)
Well compensated today.

## 2015-03-05 NOTE — Progress Notes (Signed)
Cardiology Office Note   Date:  03/05/2015   ID:  Paul Clayton, DOB 22-Feb-1946, MRN [id number]  PCP:  [name], MD  Cardiologist: Dr. Loralie Champagne  Chief Complaint: LifeVest went off    History of Present Illness: Paul Clayton is a 69 y.o. male who presents for post hospital follow-up. The patient was recently seen in the heart failure clinic by Dr. Algernon Huxley and 02/24/15. He has a nonischemic cardiomyopathy ejection fraction 15% with moderate RV dysfunction. Cardiomyopathy is likely from hypertension, cocaine, EtOH. He is HIV negative. He also had a cath on 1/16 demonstrating 20% left main stenosis, 30% ostial LAD stenosis 30% circumflex small vessel, 30% RCA. EF 20-25% with diffuse hypokinesis. He had some confusion with his medications and they were adjusted. He currently lives in a homeless shelter at [facility] but is moving to a motel June 1. Patient also has atrial fibrillation status post DCCV on 01/28/15 and is on amiodarone. He was in the hospital on 03/01/15 with chest  pain that was atypical and he ruled out for an MI.  He comes in today saying that his LifeVest went off 3 times this morning. He was still the shelter the first time a cable little shock followed by a buzzing. He hit the button each time to prevent a full shock. He says he felt completely normal without chest pain, palpitations, dizziness or presyncope. The second time it occurred while he was riding the bus here in the third time he went to the emergency room but didn't check and because he had this appointment. All 3 times for the same and he stopped it before the full shock. He feels great without chest pain, palpitations, dyspnea, dyspnea on exertion, dizziness, or presyncope. He's had no further chest pain since he was in the hospital.    Past Medical History  Diagnosis Date  . A-fib   . Hypertension   . CHF (congestive heart failure)     Nonischemic  . COPD (chronic obstructive pulmonary disease)   .  Hepatitis C   . Tobacco abuse   . Adenocarcinoma of colon     STAGE 1  . H/O ETOH abuse     Past Surgical History  Procedure Laterality Date  . Cardioversion    . Colonoscopy    . Left and right heart catheterization with coronary angiogram N/A 10/23/2014    Procedure: LEFT AND RIGHT HEART CATHETERIZATION WITH CORONARY ANGIOGRAM;  Surgeon: Larey Dresser, MD;  Location: [facility] CATH LAB;  Service: Cardiovascular;  Laterality: N/A;  . Colonoscopy with propofol N/A 11/30/2014    Procedure: COLONOSCOPY WITH PROPOFOL;  Surgeon: Milus Banister, MD;  Location: [facility] ENDOSCOPY;  Service: Endoscopy;  Laterality: N/A;  . Cardioversion N/A 01/30/2015    Procedure: CARDIOVERSION;  Surgeon: Larey Dresser, MD;  Location: [place];  Service: Cardiovascular;  Laterality: N/A;     Current Outpatient Prescriptions  Medication Sig Dispense Refill  . albuterol-ipratropium (COMBIVENT) 18-103 MCG/ACT inhaler Inhale 1 puff into the lungs every 4 (four) hours as needed for wheezing or shortness of breath. 1 Inhaler 1  . amiodarone (PACERONE) 200 MG tablet Take 1 tablet (200 mg total) by mouth daily. 30 tablet 0  . atorvastatin (LIPITOR) 20 MG tablet TAKE 1 TABLET BY MOUTH DAILY 30 tablet 0  . carvedilol (COREG) 3.125 MG tablet Take 1 tablet (3.125 mg total) by mouth 2 (two) times daily with a meal. 60 tablet 3  . DIGOX 125 MCG tablet TAKE  1 TABLET BY MOUTH   DAILY 30 tablet 0  . furosemide (LASIX) 40 MG tablet Take 40 mg by mouth daily.    Marland Kitchen lisinopril (PRINIVIL,ZESTRIL) 5 MG tablet Take 2 tablets (10 mg total) by mouth daily. 30 tablet 0  . mometasone-formoterol (DULERA) 200-5 MCG/ACT AERO Inhale 2 puffs into the lungs 2 (two) times daily as needed for wheezing. 13 g 2  . oxyCODONE-acetaminophen (PERCOCET/ROXICET) 5-325 MG per tablet Take 1-2 tablets by mouth every 6 (six) hours as needed for severe pain. 10 tablet 0  . rivaroxaban (XARELTO) 20 MG TABS tablet Take 1 tablet (20 mg total) by mouth daily with  supper. 30 tablet 3  . senna (SENOKOT) 8.6 MG TABS tablet Take 1 tablet (8.6 mg total) by mouth daily. 30 each 0  . spironolactone (ALDACTONE) 25 MG tablet Take 0.5 tablets (12.5 mg total) by mouth daily. 15 tablet 0  . Umeclidinium Bromide (INCRUSE ELLIPTA) 62.5 MCG/INH AEPB Inhale 1 Inhaler into the lungs daily. 30 each 3   No current facility-administered medications for this visit.    Allergies:   Review of patient's allergies indicates no known allergies.    Social History:  The patient  reports that he has been smoking Cigarettes.  He has never used smokeless tobacco. He reports that he drinks alcohol. He reports that he uses illicit drugs (Cocaine).   Family History:  The patient's    family history includes Hypertension in his mother.    ROS:  Please see the history of present illness.   Otherwise, review of systems are positive for dyspnea on exertion, some wheezing, chronic back pain and muscle pain.   All other systems are reviewed and negative.    PHYSICAL EXAM: VS:  BP 114/60 mmHg  Pulse 67  Ht 6' (1.829 m)  Wt 200 lb 6.4 oz (90.901 kg)  BMI 27.17 kg/m2 , BMI Body mass index is 27.17 kg/(m^2). GEN: Well nourished, well developed, in no acute distress Neck: Slight increase JVD, HJR, no carotid bruits, or masses Cardiac:  RRR; 2/6 systolic murmur at the left sternal border, no gallop, rubs, thrill or heave,  Respiratory:  clear to auscultation bilaterally, normal work of breathing GI: soft, nontender, nondistended, + BS MS: no deformity or atrophy Extremities: without cyanosis, clubbing, edema, good distal pulses bilaterally.  Skin: warm and dry, no rash Neuro:  Strength and sensation are intact    EKG:  EKG is ordered today. The ekg ordered today demonstrates sinus bradycardia 58 bpm with LVH and repolarization changes nonspecific ST-T wave changes, no acute change  Recent Labs: 10/05/2014: Pro B Natriuretic peptide (BNP) 4401.0* 01/26/2015: TSH 1.767 02/18/2015: B  Natriuretic Peptide 97.5 03/01/2015: ALT 99*; BUN 10; Creatinine 0.98; Hemoglobin 12.8*; Platelets 190; Potassium 4.1; Sodium 138    Lipid Panel    Component Value Date/Time   CHOL 137 09/16/2014 1004   TRIG 103 09/16/2014 1004   HDL 43 09/16/2014 1004   CHOLHDL 3.2 09/16/2014 1004   VLDL 21 09/16/2014 1004   LDLCALC 73 09/16/2014 1004      Wt Readings from Last 3 Encounters:  03/05/15 200 lb 6.4 oz (90.901 kg)  03/02/15 188 lb 0.8 oz (85.3 kg)  02/23/15 193 lb 4 oz (87.658 kg)      Other studies Reviewed: Additional studies/ records that were reviewed today include and review of the records demonstrates: Coronary angiography: Coronary dominance: right  Left mainstem: 20% distal tapering.     Left anterior descending (LAD): 30% ostial  LAD stenosis.  Large high D1.  Luminal irregularities.   Left circumflex (LCx): Small vessel with 30% ostial stenosis.  Luminal irregularities.    Right coronary artery (RCA): 30% proximal RCA stenosis, luminal irregularities.  Left ventriculography: EF 20-25%, diffuse hypokinesis.  Final Conclusions:  Nonischemic cardiomyopathy.  Mildly elevated filling pressures.  Low cardiac output but not markedly low.  He can increase Lasix to 40 mg daily and will see him back in office in 2 wks.   Loralie Champagne 10/23/2014, 12:28 PM   2-D echo 11/2015Study Conclusions  - Left ventricle: The cavity size was severely dilated. Wall   thickness was normal. The estimated ejection fraction was 15%.   Diffuse hypokinesis. - Aortic valve: Valve area (Vmax): 2.1 cm^2. - Mitral valve: Dilated annulus. Structurally normal valve. There   was severe regurgitation. - Left atrium: The atrium was moderately to severely dilated. - Right ventricle: The cavity size was dilated. Systolic function   was mildly reduced. - Right atrium: The atrium was mildly to moderately dilated. - Tricuspid valve: There was severe regurgitation. - Pericardium, extracardiac: A  trivial pericardial effusion was   identified.       ASSESSMENT AND PLAN:   Sumner Boast, PA-C  03/05/2015 10:13 AM    [facility] [address], [place],   [address] Phone: [phone]; Fax: [phone]

## 2015-03-05 NOTE — Assessment & Plan Note (Signed)
Recent hospitalization for chest pain that was atypical. He had a cardiac catheterization in January 2016 showed nonobstructive CAD. No further chest pain.

## 2015-03-05 NOTE — Assessment & Plan Note (Signed)
Patient seems to be doing better at the homeless shelter. He is moving to a hotel on June 1.

## 2015-03-05 NOTE — Patient Instructions (Signed)
Medication Instructions:  None  Labwork: BMET, LFTs, Magnesium level today  Testing/Procedures: None  Follow-Up: Follow up as previously scheduled on March 26, 2015 at 10:20AM at the [facility].  Any Other Special Instructions Will Be Listed Below (If Applicable).  Please expect a call from Kerrin Mo with the [employer].  He will call to get a time to come by the shelter to check your Life Vest.

## 2015-03-05 NOTE — Assessment & Plan Note (Signed)
Maintaining sinus rhythm after recent cardioversion. On amiodarone.

## 2015-03-05 NOTE — Assessment & Plan Note (Signed)
Patient has a nonischemic cardiomyopathy most likely due to hypertension, cocaine and EtOH. He is wearing a life vest and had 3 warning discharges this morning that he aborted. He was asymptomatic without chest pain, palpitations or dizziness. Most likely these were due to artifact. We do not have her representative at the office at this time to check his LifeVest. I talked to Kerrin Mo who will go to the homeless shelter this afternoon to check his device. We will check labs today including potassium and magnesium. Follow-up with Dr. Aundra Dubin is already scheduled.

## 2015-03-07 ENCOUNTER — Emergency Department (HOSPITAL_COMMUNITY)
Admission: EM | Admit: 2015-03-07 | Discharge: 2015-03-07 | Disposition: A | Discharge status: Home / Self Care | Payer: Medicare Other | Attending: Emergency Medicine | Admitting: Emergency Medicine

## 2015-03-07 ENCOUNTER — Telehealth: Payer: Self-pay | Admitting: Physician Assistant

## 2015-03-07 ENCOUNTER — Telehealth: Payer: Self-pay | Admitting: Family Medicine

## 2015-03-07 ENCOUNTER — Encounter (HOSPITAL_COMMUNITY): Payer: Self-pay | Admitting: Emergency Medicine

## 2015-03-07 ENCOUNTER — Other Ambulatory Visit (HOSPITAL_COMMUNITY): Payer: Medicare Other

## 2015-03-07 DIAGNOSIS — M25552 Pain in left hip: Secondary | ICD-10-CM | POA: Insufficient documentation

## 2015-03-07 DIAGNOSIS — G8929 Other chronic pain: Secondary | ICD-10-CM | POA: Insufficient documentation

## 2015-03-07 DIAGNOSIS — I10 Essential (primary) hypertension: Secondary | ICD-10-CM | POA: Insufficient documentation

## 2015-03-07 DIAGNOSIS — Z8619 Personal history of other infectious and parasitic diseases: Secondary | ICD-10-CM | POA: Diagnosis not present

## 2015-03-07 DIAGNOSIS — Z79899 Other long term (current) drug therapy: Secondary | ICD-10-CM | POA: Diagnosis not present

## 2015-03-07 DIAGNOSIS — I4891 Unspecified atrial fibrillation: Secondary | ICD-10-CM | POA: Diagnosis not present

## 2015-03-07 DIAGNOSIS — Z86018 Personal history of other benign neoplasm: Secondary | ICD-10-CM | POA: Insufficient documentation

## 2015-03-07 DIAGNOSIS — Z720 Tobacco use: Secondary | ICD-10-CM | POA: Diagnosis not present

## 2015-03-07 DIAGNOSIS — R079 Chest pain, unspecified: Secondary | ICD-10-CM | POA: Diagnosis not present

## 2015-03-07 DIAGNOSIS — I509 Heart failure, unspecified: Secondary | ICD-10-CM | POA: Diagnosis not present

## 2015-03-07 DIAGNOSIS — J449 Chronic obstructive pulmonary disease, unspecified: Secondary | ICD-10-CM | POA: Insufficient documentation

## 2015-03-07 LAB — I-STAT CHEM 8, ED
BUN: 16 mg/dL (ref 6–20)
CALCIUM ION: 1.26 mmol/L (ref 1.13–1.30)
CHLORIDE: 100 mmol/L — AB (ref 101–111)
CREATININE: 0.9 mg/dL (ref 0.61–1.24)
Glucose, Bld: 89 mg/dL (ref 65–99)
HCT: 40 % (ref 39.0–52.0)
Hemoglobin: 13.6 g/dL (ref 13.0–17.0)
Potassium: 4.8 mmol/L (ref 3.5–5.1)
Sodium: 140 mmol/L (ref 135–145)
TCO2: 28 mmol/L (ref 0–100)

## 2015-03-07 MED ORDER — HYDROMORPHONE HCL 1 MG/ML IJ SOLN
1.0000 mg | Freq: Once | INTRAMUSCULAR | Status: AC
  Administered 2015-03-07: 1 mg via INTRAVENOUS
  Filled 2015-03-07: qty 1

## 2015-03-07 NOTE — ED Notes (Signed)
Paul Clayton is going to provide cab voucher for [facility]

## 2015-03-07 NOTE — ED Notes (Signed)
Pt states when his external defibrillator alarms he feels short of breath and feels palpitations but denies chest pain. Pt states this started yesterday and he called the company "[employer]" who makes the product and they were supposed to send someone to his residence, he states no one ever showed. Denies sob, palpitations, or chest pain at this time.

## 2015-03-07 NOTE — Telephone Encounter (Signed)
Patient was at the hospital and needs prescription for Oxycodone. Please, follow up with Patient.

## 2015-03-07 NOTE — Discharge Instructions (Signed)
Hip Pain Your hip is the joint between your upper legs and your lower pelvis. The bones, cartilage, tendons, and muscles of your hip joint perform a lot of work each day supporting your body weight and allowing you to move around. Hip pain can range from a minor ache to severe pain in one or both of your hips. Pain may be felt on the inside of the hip joint near the groin, or the outside near the buttocks and upper thigh. You may have swelling or stiffness as well.  HOME CARE INSTRUCTIONS   Take medicines only as directed by your health care provider.  Apply ice to the injured area:  Put ice in a plastic bag.  Place a towel between your skin and the bag.  Leave the ice on for 15-20 minutes at a time, 3-4 times a day.  Keep your leg raised (elevated) when possible to lessen swelling.  Avoid activities that cause pain.  Follow specific exercises as directed by your health care provider.  Sleep with a pillow between your legs on your most comfortable side.  Record how often you have hip pain, the location of the pain, and what it feels like. SEEK MEDICAL CARE IF:   You are unable to put weight on your leg.  Your hip is red or swollen or very tender to touch.  Your pain or swelling continues or worsens after 1 week.  You have increasing difficulty walking.  You have a fever. SEEK IMMEDIATE MEDICAL CARE IF:   You have fallen.  You have a sudden increase in pain and swelling in your hip. MAKE SURE YOU:   Understand these instructions.  Will watch your condition.  Will get help right away if you are not doing well or get worse. Document Released: 03/26/2010 Document Revised: 02/20/2014 Document Reviewed: 06/02/2013 Exit[employer] Patient Information 2015 [employer]. This information is not intended to replace advice given to you by your health care provider. Make sure you discuss any questions you have with your health care provider.  

## 2015-03-07 NOTE — ED Notes (Addendum)
Per EMS - pt went to family practice today for refill on pain meds for left hip problem. Pt PCP was not there so did not get refill. Pt proceeded to leave family practice and go [place] bus[place] where he called EMS for this pain as well as his "external defibrillator has been alarming." No shocks have been given. Pt states that his doctor told him to "silence" the alarm if he was alert and oriented, which he has been. Per EMS, defibrillator has not alarmed for them and pt has remained in NSR. Ischemia shown in leads 4, 5, 6 on 12 lead. Pt alert and oriented x4. bp 120/70, hr 55

## 2015-03-07 NOTE — Telephone Encounter (Signed)
New problem   Pt's daughter returning call from Triage.

## 2015-03-07 NOTE — ED Provider Notes (Signed)
CSN: [id number]     Arrival date & time 03/07/15  1132 History   First MD Initiated Contact with Patient 03/07/15 1137     Chief Complaint  Patient presents with  . Pacemaker Problem    EXTERNAL DEFIB PROBLEM  . Hip Pain     (Consider location/radiation/quality/duration/timing/severity/associated sxs/prior Treatment) HPI Comments: Pt presents with primary complaint of left hip pain.  He was seen for similar earlier this month.  He tried to get a pain medication refill from his PCP's office, but did not have an appointment and couldn't be evaluated at the time.    He also notes that his life vest has alarmed 3 times today.  He saw his cardiologist for an identical problem two days ago.  He reports that the [employer] representative has not yet evaluated the device.  He denies CP, SOB, dizziness, or any other symptoms at the time the device alarmed.  He was able to silence the alarm each time without receiving a shock.   Patient is a 69 y.o. male presenting with hip pain.  Hip Pain This is a chronic problem. Episode onset: worse for past few months. The problem occurs constantly. The problem has not changed since onset.Associated symptoms comments: No tingling, numbness, or weakness. The symptoms are aggravated by standing and walking. Nothing relieves the symptoms. Treatments tried: oxycodone. The treatment provided mild relief.    Past Medical History  Diagnosis Date  . A-fib   . Hypertension   . CHF (congestive heart failure)     Nonischemic  . COPD (chronic obstructive pulmonary disease)   . Hepatitis C   . Tobacco abuse   . Adenocarcinoma of colon     STAGE 1  . H/O ETOH abuse    Past Surgical History  Procedure Laterality Date  . Cardioversion    . Colonoscopy    . Left and right heart catheterization with coronary angiogram N/A 10/23/2014    Procedure: LEFT AND RIGHT HEART CATHETERIZATION WITH CORONARY ANGIOGRAM;  Surgeon: Larey Dresser, MD;  Location: [facility] CATH LAB;  Service:  Cardiovascular;  Laterality: N/A;  . Colonoscopy with propofol N/A 11/30/2014    Procedure: COLONOSCOPY WITH PROPOFOL;  Surgeon: Milus Banister, MD;  Location: [facility] ENDOSCOPY;  Service: Endoscopy;  Laterality: N/A;  . Cardioversion N/A 01/30/2015    Procedure: CARDIOVERSION;  Surgeon: Larey Dresser, MD;  Location: [facility] ENDOSCOPY;  Service: Cardiovascular;  Laterality: N/A;   Family History  Problem Relation Age of Onset  . Hypertension Mother    History  Substance Use Topics  . Smoking status: Light Tobacco Smoker    Types: Cigarettes    Last Attempt to Quit: 09/01/2014  . Smokeless tobacco: Never Used  . Alcohol Use: 0.0 oz/week    0 Standard drinks or equivalent per week     Comment: last use 01-16-15    Review of Systems  All other systems reviewed and are negative.     Allergies  Review of patient's allergies indicates no known allergies.  Home Medications   Prior to Admission medications   Medication Sig Start Date End Date Taking? Authorizing Provider  albuterol-ipratropium (COMBIVENT) 18-103 MCG/ACT inhaler Inhale 1 puff into the lungs every 4 (four) hours as needed for wheezing or shortness of breath. 09/12/14   Olam Idler, MD  amiodarone (PACERONE) 200 MG tablet Take 1 tablet (200 mg total) by mouth daily. 02/23/15   Larey Dresser, MD  atorvastatin (LIPITOR) 20 MG tablet TAKE 1 TABLET BY  MOUTH DAILY 02/21/15   Larey Dresser, MD  carvedilol (COREG) 3.125 MG tablet Take 1 tablet (3.125 mg total) by mouth 2 (two) times daily with a meal. 02/23/15   Larey Dresser, MD  [place] 125 MCG tablet TAKE 1 TABLET BY MOUTH   DAILY 02/21/15   Larey Dresser, MD  furosemide (LASIX) 40 MG tablet Take 40 mg by mouth daily. 12/01/14   Historical Provider, MD  lisinopril (PRINIVIL,ZESTRIL) 5 MG tablet Take 2 tablets (10 mg total) by mouth daily. 02/23/15   Larey Dresser, MD  mometasone-formoterol [facility]) 200-5 MCG/ACT AERO Inhale 2 puffs into the lungs 2 (two) times daily as needed for  wheezing. 01/06/15   Leone Brand, MD  oxyCODONE-acetaminophen (PERCOCET/ROXICET) 5-325 MG per tablet Take 1-2 tablets by mouth every 6 (six) hours as needed for severe pain. 02/23/15   Glendell Docker, NP  rivaroxaban (XARELTO) 20 MG TABS tablet Take 1 tablet (20 mg total) by mouth daily with supper. 11/21/14   Larey Dresser, MD  senna (SENOKOT) 8.6 MG TABS tablet Take 1 tablet (8.6 mg total) by mouth daily. 10/28/14    N Rumley, DO  spironolactone (ALDACTONE) 25 MG tablet Take 0.5 tablets (12.5 mg total) by mouth daily. 11/28/14   Jennifer Piepenbrink, PA-C  Umeclidinium Bromide (INCRUSE ELLIPTA) 62.5 MCG/INH AEPB Inhale 1 Inhaler into the lungs daily. 02/20/15   Olam Idler, MD   BP 102/52 mmHg  Pulse 54  Temp(Src) 98.1 F (36.7 C) (Oral)  Resp 8  SpO2 96% Physical Exam  Constitutional: He is oriented to person, place, and time. He appears well-developed and well-nourished. No distress.  HENT:  Head: Normocephalic and atraumatic.  Mouth/Throat: Oropharynx is clear and moist.  Eyes: Conjunctivae are normal. Pupils are equal, round, and reactive to light. No scleral icterus.  Neck: Neck supple.  Cardiovascular: Normal rate, regular rhythm, normal heart sounds and intact distal pulses.   No murmur heard. Pulmonary/Chest: Effort normal and breath sounds normal. No stridor. No respiratory distress. He has no wheezes. He has no rales.  Abdominal: Soft. He exhibits no distension. There is no tenderness.  Musculoskeletal: Normal range of motion. He exhibits no edema.       Left hip: He exhibits normal range of motion, normal strength, no tenderness, no swelling, no crepitus and no deformity.  Neurological: He is alert and oriented to person, place, and time.  Skin: Skin is warm and dry. No rash noted.  Psychiatric: He has a normal mood and affect. His behavior is normal.  Nursing note and vitals reviewed.   ED Course  Procedures (including critical care time) Labs Review Labs  Reviewed  I-STAT CHEM 8, ED - Abnormal; Notable for the following:    Chloride 100 (*)    All other components within normal limits    Imaging Review No results found.   EKG Interpretation   Date/Time:  Wednesday Mar 07 2015 11:40:25 EDT Ventricular Rate:  54 PR Interval:  183 QRS Duration: 92 QT Interval:  509 QTC Calculation: 482 R Axis:   60 Text Interpretation:  Sinus rhythm LVH with secondary repolarization  abnormality Borderline prolonged QT interval Baseline wander in lead(s) V6  No significant change was found Confirmed by [facility]  MD, TREY ([id number]) on  03/07/2015 1:22:33 PM      MDM   Final diagnoses:  Left hip pain    Pt reports with complaint of chronic left hip pain.  No acute changes or symptoms.  Exam  unremarkable.  Normal strength and sensation distally.  Advised that he would have to obtain prescriptions for narcotics from PCP's office (if warranted) and that he needed to set up an appointment for a reevaluation.  Will give dose of pain meds here.  Pt agreeable to plan.    Regarding his life vest, ED staff contacted [facility] and they reported problems with the lead contacts.  They will send someone to pt's current residence for evaluation.  [facility] confirms that no shocks have been delivered.       Serita Grit, MD 03/07/15 [phone]

## 2015-03-07 NOTE — Telephone Encounter (Signed)
No indication for Oxycodone at this time. If he is having pain, he need to be evaluated.

## 2015-03-07 NOTE — Telephone Encounter (Signed)
Notified of lab results. 

## 2015-03-07 NOTE — Telephone Encounter (Signed)
Will forward to MD to see if he will refill.  Patient has a hospital follow up appt on 03/30/2015 with pcp. Jazmin Hartsell,CMA

## 2015-03-07 NOTE — Telephone Encounter (Signed)
Spoke with [place] and she will inform patient that he needs an appt and will help him make one for pain. Jazmin Hartsell,CMA

## 2015-03-08 ENCOUNTER — Emergency Department (HOSPITAL_COMMUNITY)
Admission: EM | Admit: 2015-03-08 | Discharge: 2015-03-08 | Disposition: A | Discharge status: Home / Self Care | Payer: Medicare Other | Attending: Emergency Medicine | Admitting: Emergency Medicine

## 2015-03-08 ENCOUNTER — Encounter (HOSPITAL_COMMUNITY): Payer: Self-pay

## 2015-03-08 DIAGNOSIS — Z79899 Other long term (current) drug therapy: Secondary | ICD-10-CM | POA: Diagnosis not present

## 2015-03-08 DIAGNOSIS — M545 Low back pain: Secondary | ICD-10-CM | POA: Insufficient documentation

## 2015-03-08 DIAGNOSIS — M79606 Pain in leg, unspecified: Secondary | ICD-10-CM | POA: Diagnosis not present

## 2015-03-08 DIAGNOSIS — M5432 Sciatica, left side: Secondary | ICD-10-CM | POA: Diagnosis not present

## 2015-03-08 DIAGNOSIS — Z86018 Personal history of other benign neoplasm: Secondary | ICD-10-CM | POA: Diagnosis not present

## 2015-03-08 DIAGNOSIS — I10 Essential (primary) hypertension: Secondary | ICD-10-CM | POA: Diagnosis not present

## 2015-03-08 DIAGNOSIS — Z8619 Personal history of other infectious and parasitic diseases: Secondary | ICD-10-CM | POA: Diagnosis not present

## 2015-03-08 DIAGNOSIS — I509 Heart failure, unspecified: Secondary | ICD-10-CM | POA: Insufficient documentation

## 2015-03-08 DIAGNOSIS — M25552 Pain in left hip: Secondary | ICD-10-CM | POA: Diagnosis present

## 2015-03-08 DIAGNOSIS — R52 Pain, unspecified: Secondary | ICD-10-CM | POA: Diagnosis not present

## 2015-03-08 DIAGNOSIS — J449 Chronic obstructive pulmonary disease, unspecified: Secondary | ICD-10-CM | POA: Insufficient documentation

## 2015-03-08 DIAGNOSIS — Z720 Tobacco use: Secondary | ICD-10-CM | POA: Diagnosis not present

## 2015-03-08 MED ORDER — HYDROCODONE-ACETAMINOPHEN 5-325 MG PO TABS: 1.0000 | ORAL_TABLET | ORAL | Status: DC | PRN

## 2015-03-08 MED ORDER — HYDROCODONE-ACETAMINOPHEN 5-325 MG PO TABS
2.0000 | ORAL_TABLET | Freq: Once | ORAL | Status: AC
  Administered 2015-03-08: 2 via ORAL
  Filled 2015-03-08: qty 2

## 2015-03-08 NOTE — ED Notes (Addendum)
[facility]- pt has chronic hip pain, worse today. Increased pain throughout today, worsens with rain. Pt also reporting intermittent left facial numbness, negative on stroke scale. CBG 135, BP116/64, HR 60, 16resp, 97% RA.

## 2015-03-08 NOTE — ED Provider Notes (Signed)
CSN: [id number]     Arrival date & time 03/08/15  1758 History   First MD Initiated Contact with Patient 03/08/15 1810     Chief Complaint  Patient presents with  . Hip Pain     (Consider location/radiation/quality/duration/timing/severity/associated sxs/prior Treatment) HPI The patient states he has pain in his left lower back that radiates down his buttock to his ankle. It is made worse with movements and trying to walk. He denies he has been specifically weak however the leg occasionally gives out on him. He denies any change in bowel or bladder habits. He reports he is walking at baseline. He has intermittently had difficulty with this pain for several months. He reports that it has gotten worse over the past several days. He denies any pain burning or urgency urination. He denies any associated abdominal pain. Past Medical History  Diagnosis Date  . A-fib   . Hypertension   . CHF (congestive heart failure)     Nonischemic  . COPD (chronic obstructive pulmonary disease)   . Hepatitis C   . Tobacco abuse   . Adenocarcinoma of colon     STAGE 1  . H/O ETOH abuse    Past Surgical History  Procedure Laterality Date  . Cardioversion    . Colonoscopy    . Left and right heart catheterization with coronary angiogram N/A 10/23/2014    Procedure: LEFT AND RIGHT HEART CATHETERIZATION WITH CORONARY ANGIOGRAM;  Surgeon: Larey Dresser, MD;  Location: [facility] CATH LAB;  Service: Cardiovascular;  Laterality: N/A;  . Colonoscopy with propofol N/A 11/30/2014    Procedure: COLONOSCOPY WITH PROPOFOL;  Surgeon: Milus Banister, MD;  Location: [facility] ENDOSCOPY;  Service: Endoscopy;  Laterality: N/A;  . Cardioversion N/A 01/30/2015    Procedure: CARDIOVERSION;  Surgeon: Larey Dresser, MD;  Location: [facility] ENDOSCOPY;  Service: Cardiovascular;  Laterality: N/A;   Family History  Problem Relation Age of Onset  . Hypertension Mother    History  Substance Use Topics  . Smoking status: Light Tobacco Smoker     Types: Cigarettes    Last Attempt to Quit: 09/01/2014  . Smokeless tobacco: Never Used  . Alcohol Use: 0.0 oz/week    0 Standard drinks or equivalent per week     Comment: last use 01-16-15    Review of Systems 10 Systems reviewed and are negative for acute change except as noted in the HPI.    Allergies  Review of patient's allergies indicates no known allergies.  Home Medications   Prior to Admission medications   Medication Sig Start Date End Date Taking? Authorizing Provider  albuterol-ipratropium (COMBIVENT) 18-103 MCG/ACT inhaler Inhale 1 puff into the lungs every 4 (four) hours as needed for wheezing or shortness of breath. 09/12/14   Olam Idler, MD  amiodarone (PACERONE) 200 MG tablet Take 1 tablet (200 mg total) by mouth daily. 02/23/15   Larey Dresser, MD  atorvastatin (LIPITOR) 20 MG tablet TAKE 1 TABLET BY MOUTH DAILY Patient not taking: Reported on 03/07/2015 02/21/15   Larey Dresser, MD  atorvastatin (LIPITOR) 20 MG tablet Take 20 mg by mouth daily.    Historical Provider, MD  carvedilol (COREG) 3.125 MG tablet Take 1 tablet (3.125 mg total) by mouth 2 (two) times daily with a meal. 02/23/15   Larey Dresser, MD  [place] 125 MCG tablet TAKE 1 TABLET BY MOUTH   DAILY Patient not taking: Reported on 03/07/2015 02/21/15   Larey Dresser, MD  digoxin [facility])  0.125 MG tablet Take 0.125 mg by mouth daily.    Historical Provider, MD  furosemide (LASIX) 40 MG tablet Take 40 mg by mouth daily. 12/01/14   Historical Provider, MD  HYDROcodone-acetaminophen (NORCO/VICODIN) 5-325 MG per tablet Take 1-2 tablets by mouth every 4 (four) hours as needed for moderate pain or severe pain. 03/08/15   Charlesetta Shanks, MD  lisinopril (PRINIVIL,ZESTRIL) 5 MG tablet Take 2 tablets (10 mg total) by mouth daily. 02/23/15   Larey Dresser, MD  mometasone-formoterol [facility]) 200-5 MCG/ACT AERO Inhale 2 puffs into the lungs 2 (two) times daily as needed for wheezing. 01/06/15   Leone Brand, MD   oxyCODONE-acetaminophen (PERCOCET/ROXICET) 5-325 MG per tablet Take 1-2 tablets by mouth every 6 (six) hours as needed for severe pain. 02/23/15   Glendell Docker, NP  rivaroxaban (XARELTO) 20 MG TABS tablet Take 1 tablet (20 mg total) by mouth daily with supper. 11/21/14   Larey Dresser, MD  senna (SENOKOT) 8.6 MG TABS tablet Take 1 tablet (8.6 mg total) by mouth daily. Patient not taking: Reported on 03/07/2015 10/28/14   Burna Cash Rumley, DO  spironolactone (ALDACTONE) 25 MG tablet Take 0.5 tablets (12.5 mg total) by mouth daily. 11/28/14   Jennifer Piepenbrink, PA-C  Umeclidinium Bromide (INCRUSE ELLIPTA) 62.5 MCG/INH AEPB Inhale 1 Inhaler into the lungs daily. 02/20/15   Olam Idler, MD   BP 144/67 mmHg  Pulse 68  Temp(Src) 98 F (36.7 C) (Oral)  Resp 21  SpO2 97% Physical Exam  Constitutional: He is oriented to person, place, and time. He appears well-developed and well-nourished.  HENT:  Head: Normocephalic and atraumatic.  Eyes: EOM are normal. Pupils are equal, round, and reactive to light.  Neck: Neck supple.  Cardiovascular: Normal rate, regular rhythm, normal heart sounds and intact distal pulses.   Pulmonary/Chest: Effort normal and breath sounds normal.  Abdominal: Soft. Bowel sounds are normal. He exhibits no distension. There is no tenderness.  Musculoskeletal: Normal range of motion. He exhibits no edema.  Neurological: He is alert and oriented to person, place, and time. He has normal strength. Coordination normal. GCS eye subscore is 4. GCS verbal subscore is 5. GCS motor subscore is 6.  Skin: Skin is warm, dry and intact.  Psychiatric: He has a normal mood and affect.    ED Course  Procedures (including critical care time) Labs Review Labs Reviewed - No data to display  Imaging Review No results found.   EKG Interpretation None      MDM   Final diagnoses:  Sciatica, left   No neurologic deficit. No abdominal pain. At this point time patient will be  treated for sciatica.    Charlesetta Shanks, MD 03/12/15 [phone]

## 2015-03-08 NOTE — Discharge Instructions (Signed)
Sciatica Sciatica is pain, weakness, numbness, or tingling along the path of the sciatic nerve. The nerve starts in the lower back and runs down the back of each leg. The nerve controls the muscles in the lower leg and in the back of the knee, while also providing sensation to the back of the thigh, lower leg, and the sole of your foot. Sciatica is a symptom of another medical condition. For instance, nerve damage or certain conditions, such as a herniated disk or bone spur on the spine, pinch or put pressure on the sciatic nerve. This causes the pain, weakness, or other sensations normally associated with sciatica. Generally, sciatica only affects one side of the body. CAUSES   Herniated or slipped disc.  Degenerative disk disease.  A pain disorder involving the narrow muscle in the buttocks (piriformis syndrome).  Pelvic injury or fracture.  Pregnancy.  Tumor (rare). SYMPTOMS  Symptoms can vary from mild to very severe. The symptoms usually travel from the low back to the buttocks and down the back of the leg. Symptoms can include:  Mild tingling or dull aches in the lower back, leg, or hip.  Numbness in the back of the calf or sole of the foot.  Burning sensations in the lower back, leg, or hip.  Sharp pains in the lower back, leg, or hip.  Leg weakness.  Severe back pain inhibiting movement. These symptoms may get worse with coughing, sneezing, laughing, or prolonged sitting or standing. Also, being overweight may worsen symptoms. DIAGNOSIS  Your caregiver will perform a physical exam to look for common symptoms of sciatica. He or she may ask you to do certain movements or activities that would trigger sciatic nerve pain. Other tests may be performed to find the cause of the sciatica. These may include:  Blood tests.  X-rays.  Imaging tests, such as an MRI or CT scan. TREATMENT  Treatment is directed at the cause of the sciatic pain. Sometimes, treatment is not necessary  and the pain and discomfort goes away on its own. If treatment is needed, your caregiver may suggest:  Over-the-counter medicines to relieve pain.  Prescription medicines, such as anti-inflammatory medicine, muscle relaxants, or narcotics.  Applying heat or ice to the painful area.  Steroid injections to lessen pain, irritation, and inflammation around the nerve.  Reducing activity during periods of pain.  Exercising and stretching to strengthen your abdomen and improve flexibility of your spine. Your caregiver may suggest losing weight if the extra weight makes the back pain worse.  Physical therapy.  Surgery to eliminate what is pressing or pinching the nerve, such as a bone spur or part of a herniated disk. HOME CARE INSTRUCTIONS   Only take over-the-counter or prescription medicines for pain or discomfort as directed by your caregiver.  Apply ice to the affected area for 20 minutes, 3-4 times a day for the first 48-72 hours. Then try heat in the same way.  Exercise, stretch, or perform your usual activities if these do not aggravate your pain.  Attend physical therapy sessions as directed by your caregiver.  Keep all follow-up appointments as directed by your caregiver.  Do not wear high heels or shoes that do not provide proper support.  Check your mattress to see if it is too soft. A firm mattress may lessen your pain and discomfort. SEEK IMMEDIATE MEDICAL CARE IF:   You lose control of your bowel or bladder (incontinence).  You have increasing weakness in the lower back, pelvis, buttocks,   or legs.  You have redness or swelling of your back.  You have a burning sensation when you urinate.  You have pain that gets worse when you lie down or awakens you at night.  Your pain is worse than you have experienced in the past.  Your pain is lasting longer than 4 weeks.  You are suddenly losing weight without reason. MAKE SURE YOU:  Understand these  instructions.  Will watch your condition.  Will get help right away if you are not doing well or get worse. Document Released: 09/30/2001 Document Revised: 04/06/2012 Document Reviewed: 02/15/2012 [employer] Patient Information 2015 [employer]. This information is not intended to replace advice given to you by your health care provider. Make sure you discuss any questions you have with your health care provider.  

## 2015-03-12 ENCOUNTER — Ambulatory Visit (INDEPENDENT_AMBULATORY_CARE_PROVIDER_SITE_OTHER): Payer: Medicare Other | Admitting: Family Medicine

## 2015-03-12 ENCOUNTER — Encounter: Payer: Self-pay | Admitting: Family Medicine

## 2015-03-12 VITALS — BP 143/91 | HR 78 | Temp 98.1°F | Wt 200.0 lb

## 2015-03-12 DIAGNOSIS — M545 Low back pain, unspecified: Secondary | ICD-10-CM | POA: Insufficient documentation

## 2015-03-12 DIAGNOSIS — M5432 Sciatica, left side: Secondary | ICD-10-CM | POA: Diagnosis not present

## 2015-03-12 DIAGNOSIS — M79605 Pain in left leg: Secondary | ICD-10-CM | POA: Insufficient documentation

## 2015-03-12 MED ORDER — GABAPENTIN 100 MG PO CAPS: 100.0000 mg | ORAL_CAPSULE | Freq: Three times a day (TID) | ORAL | Status: DC

## 2015-03-12 NOTE — Patient Instructions (Signed)
Great to see you!  You can try the gabapentin, the first pill tonight, if it does not cause drowsiness you can take it up to 3 times daily.   If you do not see results (relief of pain) you may increase the dose by 1 pill every 2-3 nights, up to 3 pills at a time. Again, if you do not get drowsy you may take this 3 times daily.   Sciatica Sciatica is pain, weakness, numbness, or tingling along the path of the sciatic nerve. The nerve starts in the lower back and runs down the back of each leg. The nerve controls the muscles in the lower leg and in the back of the knee, while also providing sensation to the back of the thigh, lower leg, and the sole of your foot. Sciatica is a symptom of another medical condition. For instance, nerve damage or certain conditions, such as a herniated disk or bone spur on the spine, pinch or put pressure on the sciatic nerve. This causes the pain, weakness, or other sensations normally associated with sciatica. Generally, sciatica only affects one side of the body. CAUSES   Herniated or slipped disc.  Degenerative disk disease.  A pain disorder involving the narrow muscle in the buttocks (piriformis syndrome).  Pelvic injury or fracture.  Pregnancy.  Tumor (rare). SYMPTOMS  Symptoms can vary from mild to very severe. The symptoms usually travel from the low back to the buttocks and down the back of the leg. Symptoms can include:  Mild tingling or dull aches in the lower back, leg, or hip.  Numbness in the back of the calf or sole of the foot.  Burning sensations in the lower back, leg, or hip.  Sharp pains in the lower back, leg, or hip.  Leg weakness.  Severe back pain inhibiting movement. These symptoms may get worse with coughing, sneezing, laughing, or prolonged sitting or standing. Also, being overweight may worsen symptoms. DIAGNOSIS  Your caregiver will perform a physical exam to look for common symptoms of sciatica. He or she may ask you to  do certain movements or activities that would trigger sciatic nerve pain. Other tests may be performed to find the cause of the sciatica. These may include:  Blood tests.  X-rays.  Imaging tests, such as an MRI or CT scan. TREATMENT  Treatment is directed at the cause of the sciatic pain. Sometimes, treatment is not necessary and the pain and discomfort goes away on its own. If treatment is needed, your caregiver may suggest:  Over-the-counter medicines to relieve pain.  Prescription medicines, such as anti-inflammatory medicine, muscle relaxants, or narcotics.  Applying heat or ice to the painful area.  Steroid injections to lessen pain, irritation, and inflammation around the nerve.  Reducing activity during periods of pain.  Exercising and stretching to strengthen your abdomen and improve flexibility of your spine. Your caregiver may suggest losing weight if the extra weight makes the back pain worse.  Physical therapy.  Surgery to eliminate what is pressing or pinching the nerve, such as a bone spur or part of a herniated disk. HOME CARE INSTRUCTIONS   Only take over-the-counter or prescription medicines for pain or discomfort as directed by your caregiver.  Apply ice to the affected area for 20 minutes, 3-4 times a day for the first 48-72 hours. Then try heat in the same way.  Exercise, stretch, or perform your usual activities if these do not aggravate your pain.  Attend physical therapy sessions as directed  by your caregiver.  Keep all follow-up appointments as directed by your caregiver.  Do not wear high heels or shoes that do not provide proper support.  Check your mattress to see if it is too soft. A firm mattress may lessen your pain and discomfort. SEEK IMMEDIATE MEDICAL CARE IF:   You lose control of your bowel or bladder (incontinence).  You have increasing weakness in the lower back, pelvis, buttocks, or legs.  You have redness or swelling of your  back.  You have a burning sensation when you urinate.  You have pain that gets worse when you lie down or awakens you at night.  Your pain is worse than you have experienced in the past.  Your pain is lasting longer than 4 weeks.  You are suddenly losing weight without reason. MAKE SURE YOU:  Understand these instructions.  Will watch your condition.  Will get help right away if you are not doing well or get worse. Document Released: 09/30/2001 Document Revised: 04/06/2012 Document Reviewed: 02/15/2012 [facility] Patient Information 2015 [place], [place]. This information is not intended to replace advice given to you by your health care provider. Make sure you discuss any questions you have with your health care provider.

## 2015-03-12 NOTE — Assessment & Plan Note (Addendum)
Left, no red flags 2 months of symptoms Also refer to physical therapy Follow up with PCP in 2-3 weeks

## 2015-03-12 NOTE — Progress Notes (Signed)
Patient ID: Paul Clayton, male   DOB: 15-Dec-1945, 69 y.o.   MRN: [id number]   HPI  Patient presents today for day clinic for sciatic pain  A she spends a he's had left low back pain that radiates down his left leg for the past 2 months. It is no worse than it has been for the last 2 months. At times the pains unbearable. He has tried 5 mg hydrocodone without any effect at all. He is here asking what he can do to help his pain.  Denies bowel or bladder dysfunction, saddle anesthesia, or lower extremity weakness. At times his leg hurts so bad that it gives out. Also at times he has left facial pain/ numbness associated with his left leg pain, but his left arm and torso were noninvolved with this feeling. He denies weakness difficulty thinking, or difficulty speaking these times.  He denies fever, chills, sweats.  He does have a nagging dry cough over the last 3-4 weeks. He does not feel ill and he states that previously he had a productive cough which has improved.   Smoking status noted - quit ROS: Per HPI  Objective: BP 143/91 mmHg  Pulse 78  Temp(Src) 98.1 F (36.7 C) (Oral)  Wt 200 lb (90.719 kg) Gen: NAD, alert, cooperative with exam HEENT: NCAT CV: RRR, good S1/S2, no murmur, wearing a Holter monitor Resp: CTABL, no wheezes, non-labored Ext: No edema, warm Neuro: Alert and oriented, strength 5/5 and sensation intact in bilateral lower extremities, straight leg raise positive on the contralateral leg-pain on left side with right leg raise, walks with a cane  Assessment and plan:  Sciatica of left side Left, no red flags 2 months of symptoms Also refer to physical therapy Follow up with PCP in 2-3 weeks      Orders Placed This Encounter  Procedures  . Ambulatory referral to Physical Therapy    Referral Priority:  Routine    Referral Type:  Physical Medicine    Referral Reason:  Specialty Services Required    Requested Specialty:  Physical Therapy    Number of  Visits Requested:  1    Meds ordered this encounter  Medications  . gabapentin (NEURONTIN) 100 MG capsule    Sig: Take 1 capsule (100 mg total) by mouth 3 (three) times daily.    Dispense:  90 capsule    Refill:  3

## 2015-03-13 ENCOUNTER — Encounter (HOSPITAL_COMMUNITY): Payer: Self-pay | Admitting: Emergency Medicine

## 2015-03-13 ENCOUNTER — Emergency Department (HOSPITAL_COMMUNITY)
Admission: EM | Admit: 2015-03-13 | Discharge: 2015-03-13 | Disposition: A | Discharge status: Home / Self Care | Payer: Medicare Other | Attending: Emergency Medicine | Admitting: Emergency Medicine

## 2015-03-13 DIAGNOSIS — Z8619 Personal history of other infectious and parasitic diseases: Secondary | ICD-10-CM | POA: Diagnosis not present

## 2015-03-13 DIAGNOSIS — Z720 Tobacco use: Secondary | ICD-10-CM | POA: Insufficient documentation

## 2015-03-13 DIAGNOSIS — I10 Essential (primary) hypertension: Secondary | ICD-10-CM | POA: Diagnosis not present

## 2015-03-13 DIAGNOSIS — I509 Heart failure, unspecified: Secondary | ICD-10-CM | POA: Insufficient documentation

## 2015-03-13 DIAGNOSIS — M25552 Pain in left hip: Secondary | ICD-10-CM | POA: Diagnosis not present

## 2015-03-13 DIAGNOSIS — Z86018 Personal history of other benign neoplasm: Secondary | ICD-10-CM | POA: Diagnosis not present

## 2015-03-13 DIAGNOSIS — I4891 Unspecified atrial fibrillation: Secondary | ICD-10-CM | POA: Diagnosis not present

## 2015-03-13 DIAGNOSIS — J449 Chronic obstructive pulmonary disease, unspecified: Secondary | ICD-10-CM | POA: Insufficient documentation

## 2015-03-13 DIAGNOSIS — G8929 Other chronic pain: Secondary | ICD-10-CM | POA: Diagnosis not present

## 2015-03-13 DIAGNOSIS — M5432 Sciatica, left side: Secondary | ICD-10-CM | POA: Insufficient documentation

## 2015-03-13 DIAGNOSIS — Z79899 Other long term (current) drug therapy: Secondary | ICD-10-CM | POA: Diagnosis not present

## 2015-03-13 DIAGNOSIS — R031 Nonspecific low blood-pressure reading: Secondary | ICD-10-CM | POA: Diagnosis not present

## 2015-03-13 NOTE — ED Provider Notes (Addendum)
CSN: [id number]     Arrival date & time 03/13/15  0844 History   First MD Initiated Contact with Patient 03/13/15 [phone]     Chief Complaint  Patient presents with  . Hip Pain     (Consider location/radiation/quality/duration/timing/severity/associated sxs/prior Treatment) Patient is a 69 y.o. male presenting with hip pain. The history is provided by the patient.  Hip Pain This is a chronic problem.   patient presented with pain in his left hip and back. Radiates down the leg. Has been having this pain for several months. His been seen at the ER several times and also to his primary care office. He was seen by his primary care office by different provider yesterday and started on Neurontin. He states it is not helping. He was prescribed 3 pills a day and he states he took around 3 of them. No loss of bladder bowel control. No weakness. he's been walking with a cane. He states that he hurts all the time. No new fall or injury. His had previous x-rays for the pain.  Past Medical History  Diagnosis Date  . A-fib   . Hypertension   . CHF (congestive heart failure)     Nonischemic  . COPD (chronic obstructive pulmonary disease)   . Hepatitis C   . Tobacco abuse   . Adenocarcinoma of colon     STAGE 1  . H/O ETOH abuse    Past Surgical History  Procedure Laterality Date  . Cardioversion    . Colonoscopy    . Left and right heart catheterization with coronary angiogram N/A 10/23/2014    Procedure: LEFT AND RIGHT HEART CATHETERIZATION WITH CORONARY ANGIOGRAM;  Surgeon: Larey Dresser, MD;  Location: [facility] CATH LAB;  Service: Cardiovascular;  Laterality: N/A;  . Colonoscopy with propofol N/A 11/30/2014    Procedure: COLONOSCOPY WITH PROPOFOL;  Surgeon: Milus Banister, MD;  Location: [facility] ENDOSCOPY;  Service: Endoscopy;  Laterality: N/A;  . Cardioversion N/A 01/30/2015    Procedure: CARDIOVERSION;  Surgeon: Larey Dresser, MD;  Location: [facility] ENDOSCOPY;  Service: Cardiovascular;  Laterality: N/A;    Family History  Problem Relation Age of Onset  . Hypertension Mother    History  Substance Use Topics  . Smoking status: Light Tobacco Smoker    Types: Cigarettes    Last Attempt to Quit: 09/01/2014  . Smokeless tobacco: Never Used  . Alcohol Use: 0.0 oz/week    0 Standard drinks or equivalent per week     Comment: last use 01-16-15    Review of Systems  Constitutional: Negative for fever and chills.  Musculoskeletal: Positive for back pain and gait problem. Negative for joint swelling.  Skin: Negative for rash.      Allergies  Review of patient's allergies indicates no known allergies.  Home Medications   Prior to Admission medications   Medication Sig Start Date End Date Taking? Authorizing Provider  albuterol-ipratropium (COMBIVENT) 18-103 MCG/ACT inhaler Inhale 1 puff into the lungs every 4 (four) hours as needed for wheezing or shortness of breath. 09/12/14   Olam Idler, MD  amiodarone (PACERONE) 200 MG tablet Take 1 tablet (200 mg total) by mouth daily. 02/23/15   Larey Dresser, MD  atorvastatin (LIPITOR) 20 MG tablet TAKE 1 TABLET BY MOUTH DAILY Patient not taking: Reported on 03/07/2015 02/21/15   Larey Dresser, MD  atorvastatin (LIPITOR) 20 MG tablet Take 20 mg by mouth daily.    Historical Provider, MD  carvedilol (COREG) 3.125 MG tablet  Take 1 tablet (3.125 mg total) by mouth 2 (two) times daily with a meal. 02/23/15   Larey Dresser, MD  [place] 125 MCG tablet TAKE 1 TABLET BY MOUTH   DAILY Patient not taking: Reported on 03/07/2015 02/21/15   Larey Dresser, MD  digoxin (LANOXIN) 0.125 MG tablet Take 0.125 mg by mouth daily.    Historical Provider, MD  furosemide (LASIX) 40 MG tablet Take 40 mg by mouth daily. 12/01/14   Historical Provider, MD  gabapentin (NEURONTIN) 100 MG capsule Take 1 capsule (100 mg total) by mouth 3 (three) times daily. 03/12/15   Timmothy Euler, MD  HYDROcodone-acetaminophen (NORCO/VICODIN) 5-325 MG per tablet Take 1-2 tablets by mouth  every 4 (four) hours as needed for moderate pain or severe pain. 03/08/15   Charlesetta Shanks, MD  lisinopril (PRINIVIL,ZESTRIL) 5 MG tablet Take 2 tablets (10 mg total) by mouth daily. 02/23/15   Larey Dresser, MD  mometasone-formoterol [facility]) 200-5 MCG/ACT AERO Inhale 2 puffs into the lungs 2 (two) times daily as needed for wheezing. 01/06/15   Leone Brand, MD  oxyCODONE-acetaminophen (PERCOCET/ROXICET) 5-325 MG per tablet Take 1-2 tablets by mouth every 6 (six) hours as needed for severe pain. 02/23/15   Glendell Docker, NP  rivaroxaban (XARELTO) 20 MG TABS tablet Take 1 tablet (20 mg total) by mouth daily with supper. 11/21/14   Larey Dresser, MD  senna (SENOKOT) 8.6 MG TABS tablet Take 1 tablet (8.6 mg total) by mouth daily. Patient not taking: Reported on 03/07/2015 10/28/14   Burna Cash Rumley, DO  spironolactone (ALDACTONE) 25 MG tablet Take 0.5 tablets (12.5 mg total) by mouth daily. 11/28/14   Jennifer Piepenbrink, PA-C  Umeclidinium Bromide (INCRUSE ELLIPTA) 62.5 MCG/INH AEPB Inhale 1 Inhaler into the lungs daily. 02/20/15   Olam Idler, MD   BP 102/54 mmHg  Pulse 65  Temp(Src) 97.5 F (36.4 C) (Oral)  Resp 16  SpO2 98% Physical Exam  Constitutional: He appears well-developed.  Musculoskeletal:  Some tenderness in left SI joint area. Pain with straight leg raise on left. No tenderness over hip. Neurovascular intact over foot.  Neurological: He is alert.  Skin: Skin is warm.    ED Course  Procedures (including critical care time) Labs Review Labs Reviewed - No data to display  Imaging Review No results found.   EKG Interpretation None      MDM   Final diagnoses:  Sciatica of left side    Patient with chronic pain in his hip. Has been seen by the ER a few times for this. States he hydrocodone and the Neurontin did not work. Seen yesterday by his PCP and was not given narcotics. Will not give further narcotics at this time and will not try a trial sterilely since he has  the multiple comorbidities will need to follow-up with physical therapy and his PCP as planned.Davonna Belling, MD 03/13/15 [phone]  Patient also said that he should've gone to [facility] because they will give him medicine to control his pain.  Davonna Belling, MD 03/13/15 [phone]

## 2015-03-13 NOTE — Discharge Instructions (Signed)
Back Pain, Adult °Back pain is very common. The pain often gets better over time. The cause of back pain is usually not dangerous. Most people can learn to manage their back pain on their own.  °HOME CARE  °· Stay active. Start with short walks on flat ground if you can. Try to walk farther each day. °· Do not sit, drive, or stand in one place for more than 30 minutes. Do not stay in bed. °· Do not avoid exercise or work. Activity can help your back heal faster. °· Be careful when you bend or lift an object. Bend at your knees, keep the object close to you, and do not twist. °· Sleep on a firm mattress. Lie on your side, and bend your knees. If you lie on your back, put a pillow under your knees. °· Only take medicines as told by your doctor. °· Put ice on the injured area. °¨ Put ice in a plastic bag. °¨ Place a towel between your skin and the bag. °¨ Leave the ice on for 15-20 minutes, 03-04 times a day for the first 2 to 3 days. After that, you can switch between ice and heat packs. °· Ask your doctor about back exercises or massage. °· Avoid feeling anxious or stressed. Find good ways to deal with stress, such as exercise. °GET HELP RIGHT AWAY IF:  °· Your pain does not go away with rest or medicine. °· Your pain does not go away in 1 week. °· You have new problems. °· You do not feel well. °· The pain spreads into your legs. °· You cannot control when you poop (bowel movement) or pee (urinate). °· Your arms or legs feel weak or lose feeling (numbness). °· You feel sick to your stomach (nauseous) or throw up (vomit). °· You have belly (abdominal) pain. °· You feel like you may pass out (faint). °MAKE SURE YOU:  °· Understand these instructions. °· Will watch your condition. °· Will get help right away if you are not doing well or get worse. °Document Released: 03/24/2008 Document Revised: 12/29/2011 Document Reviewed: 02/07/2014 °[employer]® Patient Information ©2015 [employer]. This information is not intended  to replace advice given to you by your health care provider. Make sure you discuss any questions you have with your health care provider. ° °Back Exercises °Back exercises help treat and prevent back injuries. The goal is to increase your strength in your belly (abdominal) and back muscles. These exercises can also help with flexibility. Start these exercises when told by your doctor. °HOME CARE °Back exercises include: °Pelvic Tilt. °· Lie on your back with your knees bent. Tilt your pelvis until the lower part of your back is against the floor. Hold this position 5 to 10 sec. Repeat this exercise 5 to 10 times. °Knee to Chest. °· Pull 1 knee up against your chest and hold for 20 to 30 seconds. Repeat this with the other knee. This may be done with the other leg straight or bent, whichever feels better. Then, pull both knees up against your chest. °Sit-Ups or Curl-Ups. °· Bend your knees 90 degrees. Start with tilting your pelvis, and do a partial, slow sit-up. Only lift your upper half 30 to 45 degrees off the floor. Take at least 2 to 3 seonds for each sit-up. Do not do sit-ups with your knees out straight. If partial sit-ups are difficult, simply do the above but with only tightening your belly (abdominal) muscles and holding it as   told. Hip-Lift.  Lie on your back with your knees flexed 90 degrees. Push down with your feet and shoulders as you raise your hips 2 inches off the floor. Hold for 10 seconds, repeat 5 to 10 times. Back Arches.  Lie on your stomach. Prop yourself up on bent elbows. Slowly press on your hands, causing an arch in your low back. Repeat 3 to 5 times. Shoulder-Lifts.  Lie face down with arms beside your body. Keep hips and belly pressed to floor as you slowly lift your head and shoulders off the floor. Do not overdo your exercises. Be careful in the beginning. Exercises may cause you some mild back discomfort. If the pain lasts for more than 15 minutes, stop the exercises until  you see your doctor. Improvement with exercise for back problems is slow.  Document Released: 11/08/2010 Document Revised: 12/29/2011 Document Reviewed: 08/07/2011 [facility] Patient Information 2015 [place], [place]. This information is not intended to replace advice given to you by your health care provider. Make sure you discuss any questions you have with your health care provider.

## 2015-03-13 NOTE — ED Notes (Addendum)
Pt reports hip pain since February. Reports he has been to PCP yesterday, was discharged with prescription for gabapentin. Pt states he has taken 12 tablets since his discharge and it has not helped. No recent injuries. Pt has been ambulatory with cane.

## 2015-03-13 NOTE — ED Notes (Signed)
Bed: OI75 Expected date:  Expected time:  Means of arrival:  Comments: EMS- 69yo M, chronic L hip pain

## 2015-03-14 ENCOUNTER — Encounter (HOSPITAL_COMMUNITY): Payer: Self-pay | Admitting: *Deleted

## 2015-03-14 ENCOUNTER — Emergency Department (HOSPITAL_COMMUNITY): Payer: Medicare Other

## 2015-03-14 ENCOUNTER — Telehealth: Payer: Self-pay | Admitting: Family Medicine

## 2015-03-14 ENCOUNTER — Other Ambulatory Visit: Payer: Self-pay

## 2015-03-14 ENCOUNTER — Emergency Department (HOSPITAL_COMMUNITY)
Admission: EM | Admit: 2015-03-14 | Discharge: 2015-03-14 | Disposition: A | Discharge status: Home / Self Care | Payer: Medicare Other | Attending: Emergency Medicine | Admitting: Emergency Medicine

## 2015-03-14 DIAGNOSIS — G8929 Other chronic pain: Secondary | ICD-10-CM | POA: Insufficient documentation

## 2015-03-14 DIAGNOSIS — I4891 Unspecified atrial fibrillation: Secondary | ICD-10-CM | POA: Diagnosis not present

## 2015-03-14 DIAGNOSIS — I10 Essential (primary) hypertension: Secondary | ICD-10-CM | POA: Insufficient documentation

## 2015-03-14 DIAGNOSIS — M25552 Pain in left hip: Secondary | ICD-10-CM | POA: Insufficient documentation

## 2015-03-14 DIAGNOSIS — Z720 Tobacco use: Secondary | ICD-10-CM | POA: Diagnosis not present

## 2015-03-14 DIAGNOSIS — J449 Chronic obstructive pulmonary disease, unspecified: Secondary | ICD-10-CM | POA: Diagnosis not present

## 2015-03-14 DIAGNOSIS — Z79899 Other long term (current) drug therapy: Secondary | ICD-10-CM | POA: Insufficient documentation

## 2015-03-14 DIAGNOSIS — Z8619 Personal history of other infectious and parasitic diseases: Secondary | ICD-10-CM | POA: Diagnosis not present

## 2015-03-14 DIAGNOSIS — R0789 Other chest pain: Secondary | ICD-10-CM | POA: Diagnosis not present

## 2015-03-14 DIAGNOSIS — R079 Chest pain, unspecified: Secondary | ICD-10-CM | POA: Insufficient documentation

## 2015-03-14 LAB — BASIC METABOLIC PANEL
Anion gap: 5 (ref 5–15)
BUN: 11 mg/dL (ref 6–20)
CO2: 26 mmol/L (ref 22–32)
CREATININE: 0.85 mg/dL (ref 0.61–1.24)
Calcium: 9 mg/dL (ref 8.9–10.3)
Chloride: 108 mmol/L (ref 101–111)
GFR calc Af Amer: >60 mL/min (ref >60)
GFR calc non Af Amer: >60 mL/min (ref >60)
Glucose, Bld: 117 mg/dL — ABNORMAL HIGH (ref 65–99)
Potassium: 4.3 mmol/L (ref 3.5–5.1)
SODIUM: 139 mmol/L (ref 135–145)

## 2015-03-14 LAB — CBC WITH DIFFERENTIAL/PLATELET
BASOS ABS: 0 10*3/uL (ref 0.0–0.1)
BASOS PCT: 0 % (ref 0–1)
Eosinophils Absolute: 0.3 10*3/uL (ref 0.0–0.7)
Eosinophils Relative: 3 % (ref 0–5)
HCT: 36.6 % — ABNORMAL LOW (ref 39.0–52.0)
Hemoglobin: 12.4 g/dL — ABNORMAL LOW (ref 13.0–17.0)
LYMPHS PCT: 20 % (ref 12–46)
Lymphs Abs: 1.5 10*3/uL (ref 0.7–4.0)
MCH: 33 pg (ref 26.0–34.0)
MCHC: 33.9 g/dL (ref 30.0–36.0)
MCV: 97.3 fL (ref 78.0–100.0)
Monocytes Absolute: 0.6 10*3/uL (ref 0.1–1.0)
Monocytes Relative: 8 % (ref 3–12)
NEUTROS ABS: 5.1 10*3/uL (ref 1.7–7.7)
Neutrophils Relative %: 69 % (ref 43–77)
Platelets: 164 10*3/uL (ref 150–400)
RBC: 3.76 MIL/uL — ABNORMAL LOW (ref 4.22–5.81)
RDW: 12.5 % (ref 11.5–15.5)
WBC: 7.4 10*3/uL (ref 4.0–10.5)

## 2015-03-14 LAB — TROPONIN I
Troponin I: <0.03 ng/mL (ref <0.031)
Troponin I: <0.03 ng/mL (ref <0.031)

## 2015-03-14 MED ORDER — OXYCODONE-ACETAMINOPHEN 5-325 MG PO TABS
2.0000 | ORAL_TABLET | Freq: Once | ORAL | Status: AC
  Administered 2015-03-14: 2 via ORAL
  Filled 2015-03-14: qty 2

## 2015-03-14 MED ORDER — SODIUM CHLORIDE 0.9 % IV SOLN
INTRAVENOUS | Status: DC
  Administered 2015-03-14: 09:00:00 via INTRAVENOUS

## 2015-03-14 NOTE — Discharge Instructions (Signed)
Chronic Pain Chronic pain can be defined as pain that is off and on and lasts for 3-6 months or longer. Many things cause chronic pain, which can make it difficult to make a diagnosis. There are many treatment options available for chronic pain. However, finding a treatment that works well for you may require trying various approaches until the right one is found. Many people benefit from a combination of two or more types of treatment to control their pain. SYMPTOMS  Chronic pain can occur anywhere in the body and can range from mild to very severe. Some types of chronic pain include:  Headache.  Low back pain.  Cancer pain.  Arthritis pain.  Neurogenic pain. This is pain resulting from damage to nerves. People with chronic pain may also have other symptoms such as:  Depression.  Anger.  Insomnia.  Anxiety. DIAGNOSIS  Your health care provider will help diagnose your condition over time. In many cases, the initial focus will be on excluding possible conditions that could be causing the pain. Depending on your symptoms, your health care provider may order tests to diagnose your condition. Some of these tests may include:   Blood tests.   CT scan.   MRI.   X-rays.   Ultrasounds.   Nerve conduction studies.  You may need to see a specialist.  TREATMENT  Finding treatment that works well may take time. You may be referred to a pain specialist. He or she may prescribe medicine or therapies, such as:   Mindful meditation or yoga.  Shots (injections) of numbing or pain-relieving medicines into the spine or area of pain.  Local electrical stimulation.  Acupuncture.   Massage therapy.   Aroma, color, light, or sound therapy.   Biofeedback.   Working with a physical therapist to keep from getting stiff.   Regular, gentle exercise.   Cognitive or behavioral therapy.   Group support.  Sometimes, surgery may be recommended.  HOME CARE INSTRUCTIONS    Take all medicines as directed by your health care provider.   Lessen stress in your life by relaxing and doing things such as listening to calming music.   Exercise or be active as directed by your health care provider.   Eat a healthy diet and include things such as vegetables, fruits, fish, and lean meats in your diet.   Keep all follow-up appointments with your health care provider.   Attend a support group with others suffering from chronic pain. SEEK MEDICAL CARE IF:   Your pain gets worse.   You develop a new pain that was not there before.   You cannot tolerate medicines given to you by your health care provider.   You have new symptoms since your last visit with your health care provider.  SEEK IMMEDIATE MEDICAL CARE IF:   You feel weak.   You have decreased sensation or numbness.   You lose control of bowel or bladder function.   Your pain suddenly gets much worse.   You develop shaking.  You develop chills.  You develop confusion.  You develop chest pain.  You develop shortness of breath.  MAKE SURE YOU:  Understand these instructions.  Will watch your condition.  Will get help right away if you are not doing well or get worse. Document Released: 06/28/2002 Document Revised: 06/08/2013 Document Reviewed: 04/01/2013 [facility] Patient Information 2015 [place], [place]. This information is not intended to replace advice given to you by your health care provider. Make sure you discuss any  questions you have with your health care provider. Chest Pain (Nonspecific) It is often hard to give a specific diagnosis for the cause of chest pain. There is always a chance that your pain could be related to something serious, such as a heart attack or a blood clot in the lungs. You need to follow up with your health care provider for further evaluation. CAUSES   Heartburn.  Pneumonia or bronchitis.  Anxiety or stress.  Inflammation around your  heart (pericarditis) or lung (pleuritis or pleurisy).  A blood clot in the lung.  A collapsed lung (pneumothorax). It can develop suddenly on its own (spontaneous pneumothorax) or from trauma to the chest.  Shingles infection (herpes zoster virus). The chest wall is composed of bones, muscles, and cartilage. Any of these can be the source of the pain.  The bones can be bruised by injury.  The muscles or cartilage can be strained by coughing or overwork.  The cartilage can be affected by inflammation and become sore (costochondritis). DIAGNOSIS  Lab tests or other studies may be needed to find the cause of your pain. Your health care provider may have you take a test called an ambulatory electrocardiogram (ECG). An ECG records your heartbeat patterns over a 24-hour period. You may also have other tests, such as:  Transthoracic echocardiogram (TTE). During echocardiography, sound waves are used to evaluate how blood flows through your heart.  Transesophageal echocardiogram (TEE).  Cardiac monitoring. This allows your health care provider to monitor your heart rate and rhythm in real time.  Holter monitor. This is a portable device that records your heartbeat and can help diagnose heart arrhythmias. It allows your health care provider to track your heart activity for several days, if needed.  Stress tests by exercise or by giving medicine that makes the heart beat faster. TREATMENT   Treatment depends on what may be causing your chest pain. Treatment may include:  Acid blockers for heartburn.  Anti-inflammatory medicine.  Pain medicine for inflammatory conditions.  Antibiotics if an infection is present.  You may be advised to change lifestyle habits. This includes stopping smoking and avoiding alcohol, caffeine, and chocolate.  You may be advised to keep your head raised (elevated) when sleeping. This reduces the chance of acid going backward from your stomach into your  esophagus. Most of the time, nonspecific chest pain will improve within 2-3 days with rest and mild pain medicine.  HOME CARE INSTRUCTIONS   If antibiotics were prescribed, take them as directed. Finish them even if you start to feel better.  For the next few days, avoid physical activities that bring on chest pain. Continue physical activities as directed.  Do not use any tobacco products, including cigarettes, chewing tobacco, or electronic cigarettes.  Avoid drinking alcohol.  Only take medicine as directed by your health care provider.  Follow your health care provider's suggestions for further testing if your chest pain does not go away.  Keep any follow-up appointments you made. If you do not go to an appointment, you could develop lasting (chronic) problems with pain. If there is any problem keeping an appointment, call to reschedule. SEEK MEDICAL CARE IF:   Your chest pain does not go away, even after treatment.  You have a rash with blisters on your chest.  You have a fever. SEEK IMMEDIATE MEDICAL CARE IF:   You have increased chest pain or pain that spreads to your arm, neck, jaw, back, or abdomen.  You have shortness of breath.  You have an increasing cough, or you cough up blood.  You have severe back or abdominal pain.  You feel nauseous or vomit.  You have severe weakness.  You faint.  You have chills. This is an emergency. Do not wait to see if the pain will go away. Get medical help at once. Call your local emergency services (911 in [place] Do not drive yourself to the hospital. MAKE SURE YOU:   Understand these instructions.  Will watch your condition.  Will get help right away if you are not doing well or get worse. Document Released: 07/16/2005 Document Revised: 10/11/2013 Document Reviewed: 05/11/2008 [facility] Patient Information 2015 [place], [place]. This information is not intended to replace advice given to you by your health care provider.  Make sure you discuss any questions you have with your health care provider.
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
## 2015-03-14 NOTE — Telephone Encounter (Signed)
Spoke with MD and patient needs to keep his regular follow up for this concern. Dilpreet Faires,CMA

## 2015-03-14 NOTE — ED Notes (Signed)
Triage charted by RN under NT login. Recharted by RN and charge RN notified.

## 2015-03-14 NOTE — Telephone Encounter (Signed)
Patient request prescription for Oxycodone 5-325 mg. Please, follow up with Patient.

## 2015-03-14 NOTE — ED Notes (Signed)
Pt. Is from home. Complaint of chronic L hip pain and new L sided sharp chest pain with radiation to back. Pt. States he had nausea and diaphoresis when he woke up this AM. Pt. Has dry nonproductive cough. Pt received 324 asa and 2 Nitro in route. Pain now 5/10.

## 2015-03-16 ENCOUNTER — Other Ambulatory Visit: Payer: Self-pay | Admitting: Cardiology

## 2015-03-22 ENCOUNTER — Emergency Department (HOSPITAL_COMMUNITY)
Admission: EM | Admit: 2015-03-22 | Discharge: 2015-03-22 | Disposition: A | Discharge status: Home / Self Care | Payer: Medicare Other | Attending: Emergency Medicine | Admitting: Emergency Medicine

## 2015-03-22 ENCOUNTER — Encounter (HOSPITAL_COMMUNITY): Payer: Self-pay

## 2015-03-22 DIAGNOSIS — I509 Heart failure, unspecified: Secondary | ICD-10-CM | POA: Insufficient documentation

## 2015-03-22 DIAGNOSIS — Z720 Tobacco use: Secondary | ICD-10-CM | POA: Diagnosis not present

## 2015-03-22 DIAGNOSIS — Z7951 Long term (current) use of inhaled steroids: Secondary | ICD-10-CM | POA: Diagnosis not present

## 2015-03-22 DIAGNOSIS — Z9889 Other specified postprocedural states: Secondary | ICD-10-CM | POA: Insufficient documentation

## 2015-03-22 DIAGNOSIS — F10129 Alcohol abuse with intoxication, unspecified: Secondary | ICD-10-CM | POA: Diagnosis not present

## 2015-03-22 DIAGNOSIS — I4891 Unspecified atrial fibrillation: Secondary | ICD-10-CM | POA: Diagnosis not present

## 2015-03-22 DIAGNOSIS — Z85038 Personal history of other malignant neoplasm of large intestine: Secondary | ICD-10-CM | POA: Insufficient documentation

## 2015-03-22 DIAGNOSIS — M79606 Pain in leg, unspecified: Secondary | ICD-10-CM | POA: Diagnosis not present

## 2015-03-22 DIAGNOSIS — Z79899 Other long term (current) drug therapy: Secondary | ICD-10-CM | POA: Diagnosis not present

## 2015-03-22 DIAGNOSIS — Z8619 Personal history of other infectious and parasitic diseases: Secondary | ICD-10-CM | POA: Insufficient documentation

## 2015-03-22 DIAGNOSIS — J449 Chronic obstructive pulmonary disease, unspecified: Secondary | ICD-10-CM | POA: Insufficient documentation

## 2015-03-22 DIAGNOSIS — I10 Essential (primary) hypertension: Secondary | ICD-10-CM | POA: Diagnosis not present

## 2015-03-22 DIAGNOSIS — M25552 Pain in left hip: Secondary | ICD-10-CM | POA: Insufficient documentation

## 2015-03-22 DIAGNOSIS — G8929 Other chronic pain: Secondary | ICD-10-CM | POA: Diagnosis not present

## 2015-03-22 DIAGNOSIS — R55 Syncope and collapse: Secondary | ICD-10-CM | POA: Diagnosis not present

## 2015-03-22 DIAGNOSIS — R079 Chest pain, unspecified: Secondary | ICD-10-CM | POA: Insufficient documentation

## 2015-03-22 DIAGNOSIS — R404 Transient alteration of awareness: Secondary | ICD-10-CM | POA: Diagnosis not present

## 2015-03-22 LAB — I-STAT CHEM 8, ED
BUN: 16 mg/dL (ref 6–20)
CALCIUM ION: 1.17 mmol/L (ref 1.13–1.30)
Chloride: 108 mmol/L (ref 101–111)
Creatinine, Ser: 1 mg/dL (ref 0.61–1.24)
Glucose, Bld: 84 mg/dL (ref 65–99)
HCT: 42 % (ref 39.0–52.0)
HEMOGLOBIN: 14.3 g/dL (ref 13.0–17.0)
POTASSIUM: 3.9 mmol/L (ref 3.5–5.1)
SODIUM: 144 mmol/L (ref 135–145)
TCO2: 21 mmol/L (ref 0–100)

## 2015-03-22 MED ORDER — IBUPROFEN 200 MG PO TABS
600.0000 mg | ORAL_TABLET | Freq: Once | ORAL | Status: AC
  Administered 2015-03-22: 600 mg via ORAL
  Filled 2015-03-22: qty 3

## 2015-03-22 NOTE — ED Notes (Signed)
Spoke with Zoll about life vest - Zoll will follow-up directly with pt after discharge. Per Zoll, pt is to call Zoll when he puts the vest back on.

## 2015-03-22 NOTE — ED Notes (Addendum)
Pt refused wheelchair - ambulatory w/ steady gait, using cane.
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
## 2015-03-22 NOTE — Discharge Instructions (Signed)
Arthralgia °Your caregiver has diagnosed you as suffering from an arthralgia. Arthralgia means there is pain in a joint. This can come from many reasons including: °· Bruising the joint which causes soreness (inflammation) in the joint. °· Wear and tear on the joints which occur as we grow older (osteoarthritis). °· Overusing the joint. °· Various forms of arthritis. °· Infections of the joint. °Regardless of the cause of pain in your joint, most of these different pains respond to anti-inflammatory drugs and rest. The exception to this is when a joint is infected, and these cases are treated with antibiotics, if it is a bacterial infection. °HOME CARE INSTRUCTIONS  °· Rest the injured area for as long as directed by your caregiver. Then slowly start using the joint as directed by your caregiver and as the pain allows. Crutches as directed may be useful if the ankles, knees or hips are involved. If the knee was splinted or casted, continue use and care as directed. If an stretchy or elastic wrapping bandage has been applied today, it should be removed and re-applied every 3 to 4 hours. It should not be applied tightly, but firmly enough to keep swelling down. Watch toes and feet for swelling, bluish discoloration, coldness, numbness or excessive pain. If any of these problems (symptoms) occur, remove the ace bandage and re-apply more loosely. If these symptoms persist, contact your caregiver or return to this location. °· For the first 24 hours, keep the injured extremity elevated on pillows while lying down. °· Apply ice for 15-20 minutes to the sore joint every couple hours while awake for the first half day. Then 03-04 times per day for the first 48 hours. Put the ice in a plastic bag and place a towel between the bag of ice and your skin. °· Wear any splinting, casting, elastic bandage applications, or slings as instructed. °· Only take over-the-counter or prescription medicines for pain, discomfort, or fever as  directed by your caregiver. Do not use aspirin immediately after the injury unless instructed by your physician. Aspirin can cause increased bleeding and bruising of the tissues. °· If you were given crutches, continue to use them as instructed and do not resume weight bearing on the sore joint until instructed. °Persistent pain and inability to use the sore joint as directed for more than 2 to 3 days are warning signs indicating that you should see a caregiver for a follow-up visit as soon as possible. Initially, a hairline fracture (break in bone) may not be evident on X-rays. Persistent pain and swelling indicate that further evaluation, non-weight bearing or use of the joint (use of crutches or slings as instructed), or further X-rays are indicated. X-rays may sometimes not show a small fracture until a week or 10 days later. Make a follow-up appointment with your own caregiver or one to whom we have referred you. A radiologist (specialist in reading X-rays) may read your X-rays. Make sure you know how you are to obtain your X-ray results. Do not assume everything is normal if you do not hear from us. °SEEK MEDICAL CARE IF: °Bruising, swelling, or pain increases. °SEEK IMMEDIATE MEDICAL CARE IF:  °· Your fingers or toes are numb or blue. °· The pain is not responding to medications and continues to stay the same or get worse. °· The pain in your joint becomes severe. °· You develop a fever over 102° F (38.9° C). °· It becomes impossible to move or use the joint. °MAKE SURE YOU:  °·   Understand these instructions.  Will watch your condition.  Will get help right away if you are not doing well or get worse. Document Released: 10/06/2005 Document Revised: 12/29/2011 Document Reviewed: 05/24/2008 [facility] Patient Information 2015 [place], [place]. This information is not intended to replace advice given to you by your health care provider. Make sure you discuss any questions you have with your health care  provider.  Syncope Syncope is a medical term for fainting or passing out. This means you lose consciousness and drop to the ground. People are generally unconscious for less than 5 minutes. You may have some muscle twitches for up to 15 seconds before waking up and returning to normal. Syncope occurs more often in older adults, but it can happen to anyone. While most causes of syncope are not dangerous, syncope can be a sign of a serious medical problem. It is important to seek medical care.  CAUSES  Syncope is caused by a sudden drop in blood flow to the brain. The specific cause is often not determined. Factors that can bring on syncope include:  Taking medicines that lower blood pressure.  Sudden changes in posture, such as standing up quickly.  Taking more medicine than prescribed.  Standing in one place for too long.  Seizure disorders.  Dehydration and excessive exposure to heat.  Low blood sugar (hypoglycemia).  Straining to have a bowel movement.  Heart disease, irregular heartbeat, or other circulatory problems.  Fear, emotional distress, seeing blood, or severe pain. SYMPTOMS  Right before fainting, you may:  Feel dizzy or light-headed.  Feel nauseous.  See all white or all black in your field of vision.  Have cold, clammy skin. DIAGNOSIS  Your health care provider will ask about your symptoms, perform a physical exam, and perform an electrocardiogram (ECG) to record the electrical activity of your heart. Your health care provider may also perform other heart or blood tests to determine the cause of your syncope which may include:  Transthoracic echocardiogram (TTE). During echocardiography, sound waves are used to evaluate how blood flows through your heart.  Transesophageal echocardiogram (TEE).  Cardiac monitoring. This allows your health care provider to monitor your heart rate and rhythm in real time.  Holter monitor. This is a portable device that records  your heartbeat and can help diagnose heart arrhythmias. It allows your health care provider to track your heart activity for several days, if needed.  Stress tests by exercise or by giving medicine that makes the heart beat faster. TREATMENT  In most cases, no treatment is needed. Depending on the cause of your syncope, your health care provider may recommend changing or stopping some of your medicines. HOME CARE INSTRUCTIONS  Have someone stay with you until you feel stable.  Do not drive, use machinery, or play sports until your health care provider says it is okay.  Keep all follow-up appointments as directed by your health care provider.  Lie down right away if you start feeling like you might faint. Breathe deeply and steadily. Wait until all the symptoms have passed.  Drink enough fluids to keep your urine clear or pale yellow.  If you are taking blood pressure or heart medicine, get up slowly and take several minutes to sit and then stand. This can reduce dizziness. SEEK IMMEDIATE MEDICAL CARE IF:   You have a severe headache.  You have unusual pain in the chest, abdomen, or back.  You are bleeding from your mouth or rectum, or you have black or  tarry stool.  You have an irregular or very fast heartbeat.  You have pain with breathing.  You have repeated fainting or seizure-like jerking during an episode.  You faint when sitting or lying down.  You have confusion.  You have trouble walking.  You have severe weakness.  You have vision problems. If you fainted, call your local emergency services (911 in [place] Do not drive yourself to the hospital.  MAKE SURE YOU:  Understand these instructions.  Will watch your condition.  Will get help right away if you are not doing well or get worse. Document Released: 10/06/2005 Document Revised: 10/11/2013 Document Reviewed: 12/05/2011 [facility] Patient Information 2015 [place], [place]. This information is not intended to  replace advice given to you by your health care provider. Make sure you discuss any questions you have with your health care provider.

## 2015-03-22 NOTE — ED Notes (Addendum)
Per EMS - pt normally wears life vest and has extensive hx. Pt consumed 40oz ETOH and began having chest pain, reports passing out (thinks he was sitting when this happened)/denies falling. Pt kept walking to phone and called EMS. Hx cocaine use - states last use was in April. Pt c/o chronic left hip/side pain (8/10 pain). Pt denied CP w/ EMS - reports pain being 6/10 now. Incontinent of urine upon EMS arrival. Pt had been staying at shelter, now extended stay or homeless. VSS- 140/90, 110bpm, RR 16

## 2015-03-22 NOTE — ED Notes (Signed)
Pt requesting cab voucher - explained that pt may have bus pass but will need to wait until Social Work is here in the morning for a cab voucher. Pt understands plan.

## 2015-03-22 NOTE — ED Notes (Signed)
Tech from [employer] contacted this nurse concerning pt not wearing vest at this time. Tech requesting vest be placed on patient. Pt reports he hasn't worse life vest all day "because I just didn't want to." Pt reports his daughter would bring it to him "but she had a fall out with me." EDP aware pt is not wearing vest at this time. Pt is on cardiac monitor in room. Pt also expressing concerns of chronic L hip pain and requesting medication for pain

## 2015-03-26 ENCOUNTER — Encounter (HOSPITAL_COMMUNITY): Payer: Medicare Other

## 2015-03-26 ENCOUNTER — Ambulatory Visit (HOSPITAL_COMMUNITY): Payer: Medicare Other

## 2015-03-30 ENCOUNTER — Ambulatory Visit (INDEPENDENT_AMBULATORY_CARE_PROVIDER_SITE_OTHER): Payer: Medicare Other | Admitting: Family Medicine

## 2015-03-30 ENCOUNTER — Encounter: Payer: Self-pay | Admitting: Family Medicine

## 2015-03-30 VITALS — BP 152/86 | HR 60 | Temp 98.4°F | Ht 72.0 in | Wt 200.0 lb

## 2015-03-30 DIAGNOSIS — M5432 Sciatica, left side: Secondary | ICD-10-CM | POA: Diagnosis not present

## 2015-03-30 MED ORDER — TRAMADOL HCL 50 MG PO TABS: 50.0000 mg | ORAL_TABLET | Freq: Four times a day (QID) | ORAL | Status: DC | PRN

## 2015-03-30 MED ORDER — BACLOFEN 10 MG PO TABS: 10.0000 mg | ORAL_TABLET | Freq: Three times a day (TID) | ORAL | Status: DC

## 2015-03-30 NOTE — Patient Instructions (Signed)
It was great seeing you today.   1. Your back and leg pain is coming from an old femur fracture and muscle spasms. The main way to make this better is physical therapy and exercise/stretching. You were referred to physical therapy a month ago, and they called you and left a message. Call them to schedule you visit.  2. In the meantime. Take Baclofen 10mg  up to three times a day as needed for muscle spasm / back pain and / or Ultram 50mg  every 6 hours as needed for pain. These medications can make you drowsy so no driving while taking them.    Please bring all your medications to every doctors visit  Sign up for My Chart to have easy access to your labs results, and communication with your Primary care physician.  Next Appointment  Please make an appointment with Dr Berkley Harvey in 1 month   I look forward to talking with you again at our next visit. If you have any questions or concerns before then, please call the clinic at [phone].  Take Care,   Dr [name]

## 2015-03-30 NOTE — Progress Notes (Signed)
  Patient name: Paul Clayton MRN [id number]  Date of birth: May 18, 1946  CC & HPI:  Paul Clayton is a 69 y.o. male presenting today for back pain  BACK PAIN Quality: Achy, throbbing  Location & Radiation: Left lumbar with radiation down left posterior leg Timing (Onset, Duration, Freq): Began 4 months ago, gradual worsening, no acute onset, ports pain is now constant Worse with: Walking  Better with: Self massaging his lower back  Any Trauma: denies  Red Flags  Urinary retention: no   Numbness/Weakness: no   Fever/chills/sweats: no   Night pain: yes   Unexplained weight loss: no   No relief with bedrest: yes   Hx of cancer/immunosuppression: yes - colon CA  Hx of IV drug use: yes, cocaine and alcohol    Hx of osteoporosis or chronic steroid use: no  - History of alcohol abuse  ROS: See HPI   Medical & Surgical Hx:  Reviewed  Medications & Allergies: Reviewed  Social History: Reviewed:   Objective Findings:  Vitals: BP 152/86 mmHg  Pulse 60  Temp(Src) 98.4 F (36.9 C) (Oral)  Ht 6' (1.829 m)  Wt 200 lb (90.719 kg)  BMI 27.12 kg/m2  Gen: NAD CV: RRR w/o m/r/g, pulses +2 b/l Resp: CTAB w/ normal respiratory effort Back Exam:  Inspection: Unremarkable  Palpable tenderness: Left lumbar paraspinal Leg strength: Quad: 5/5 Hamstring: 5/5 Hip flexor: 5/5 Hip abductors: 5/5  Strength at foot: Plantar-flexion: 5/5 Dorsi-flexion: 5/5 Eversion: 5/5 Inversion: 5/5  Sensory change: Gross sensation intact to all lumbar and sacral dermatomes.  Reflexes: 2+ at both patellar tendons, 2+ at achilles tendons, Babinski's downgoing.  Gait unremarkable. SLR laying: Negative  XSLR laying: Negative  FABER: negative.  Assessment & Plan:   Please See Problem Focused Assessment & Plan
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
## 2015-04-17 ENCOUNTER — Ambulatory Visit (HOSPITAL_COMMUNITY): Admission: RE | Admit: 2015-04-17 | Payer: Medicare Other | Source: Ambulatory Visit

## 2015-04-17 ENCOUNTER — Encounter (HOSPITAL_COMMUNITY): Payer: Medicare Other

## 2015-04-17 ENCOUNTER — Other Ambulatory Visit: Payer: Self-pay | Admitting: Cardiology

## 2015-04-17 ENCOUNTER — Ambulatory Visit (HOSPITAL_COMMUNITY)
Admission: RE | Admit: 2015-04-17 | Discharge: 2015-04-17 | Disposition: A | Discharge status: Home / Self Care | Payer: Medicare Other | Source: Ambulatory Visit | Attending: Cardiology | Admitting: Cardiology

## 2015-04-17 DIAGNOSIS — I5020 Unspecified systolic (congestive) heart failure: Secondary | ICD-10-CM

## 2015-04-18 ENCOUNTER — Encounter (HOSPITAL_COMMUNITY): Payer: Self-pay

## 2015-04-18 ENCOUNTER — Observation Stay (HOSPITAL_COMMUNITY)
Admission: EM | Admit: 2015-04-18 | Discharge: 2015-04-20 | Disposition: A | Discharge status: Home / Self Care | Payer: Medicare Other | Attending: Family Medicine | Admitting: Family Medicine

## 2015-04-18 DIAGNOSIS — N3941 Urge incontinence: Secondary | ICD-10-CM | POA: Insufficient documentation

## 2015-04-18 DIAGNOSIS — J449 Chronic obstructive pulmonary disease, unspecified: Secondary | ICD-10-CM | POA: Diagnosis not present

## 2015-04-18 DIAGNOSIS — Z590 Homelessness: Secondary | ICD-10-CM | POA: Diagnosis not present

## 2015-04-18 DIAGNOSIS — I429 Cardiomyopathy, unspecified: Secondary | ICD-10-CM | POA: Insufficient documentation

## 2015-04-18 DIAGNOSIS — R55 Syncope and collapse: Secondary | ICD-10-CM | POA: Diagnosis not present

## 2015-04-18 DIAGNOSIS — R0602 Shortness of breath: Secondary | ICD-10-CM | POA: Diagnosis not present

## 2015-04-18 DIAGNOSIS — Z85038 Personal history of other malignant neoplasm of large intestine: Secondary | ICD-10-CM | POA: Diagnosis not present

## 2015-04-18 DIAGNOSIS — F329 Major depressive disorder, single episode, unspecified: Secondary | ICD-10-CM | POA: Diagnosis not present

## 2015-04-18 DIAGNOSIS — F419 Anxiety disorder, unspecified: Secondary | ICD-10-CM | POA: Diagnosis not present

## 2015-04-18 DIAGNOSIS — I5022 Chronic systolic (congestive) heart failure: Secondary | ICD-10-CM | POA: Insufficient documentation

## 2015-04-18 DIAGNOSIS — F1410 Cocaine abuse, uncomplicated: Secondary | ICD-10-CM | POA: Insufficient documentation

## 2015-04-18 DIAGNOSIS — B182 Chronic viral hepatitis C: Secondary | ICD-10-CM | POA: Insufficient documentation

## 2015-04-18 DIAGNOSIS — B1920 Unspecified viral hepatitis C without hepatic coma: Secondary | ICD-10-CM | POA: Insufficient documentation

## 2015-04-18 DIAGNOSIS — I4891 Unspecified atrial fibrillation: Secondary | ICD-10-CM | POA: Insufficient documentation

## 2015-04-18 DIAGNOSIS — R079 Chest pain, unspecified: Secondary | ICD-10-CM | POA: Diagnosis not present

## 2015-04-18 DIAGNOSIS — K219 Gastro-esophageal reflux disease without esophagitis: Secondary | ICD-10-CM | POA: Insufficient documentation

## 2015-04-18 DIAGNOSIS — I2510 Atherosclerotic heart disease of native coronary artery without angina pectoris: Secondary | ICD-10-CM | POA: Insufficient documentation

## 2015-04-18 DIAGNOSIS — F17210 Nicotine dependence, cigarettes, uncomplicated: Secondary | ICD-10-CM | POA: Diagnosis not present

## 2015-04-18 DIAGNOSIS — R5383 Other fatigue: Secondary | ICD-10-CM | POA: Diagnosis not present

## 2015-04-18 DIAGNOSIS — F1290 Cannabis use, unspecified, uncomplicated: Secondary | ICD-10-CM | POA: Diagnosis not present

## 2015-04-18 DIAGNOSIS — I10 Essential (primary) hypertension: Secondary | ICD-10-CM | POA: Diagnosis not present

## 2015-04-18 DIAGNOSIS — M1612 Unilateral primary osteoarthritis, left hip: Secondary | ICD-10-CM | POA: Insufficient documentation

## 2015-04-18 DIAGNOSIS — Z79899 Other long term (current) drug therapy: Secondary | ICD-10-CM | POA: Insufficient documentation

## 2015-04-18 DIAGNOSIS — G8929 Other chronic pain: Secondary | ICD-10-CM | POA: Diagnosis not present

## 2015-04-18 DIAGNOSIS — M545 Low back pain: Secondary | ICD-10-CM | POA: Insufficient documentation

## 2015-04-18 DIAGNOSIS — R911 Solitary pulmonary nodule: Secondary | ICD-10-CM

## 2015-04-18 DIAGNOSIS — M5432 Sciatica, left side: Secondary | ICD-10-CM | POA: Diagnosis not present

## 2015-04-18 DIAGNOSIS — I480 Paroxysmal atrial fibrillation: Secondary | ICD-10-CM | POA: Diagnosis not present

## 2015-04-18 DIAGNOSIS — G43909 Migraine, unspecified, not intractable, without status migrainosus: Secondary | ICD-10-CM | POA: Insufficient documentation

## 2015-04-18 HISTORY — DX: Low back pain: M54.5

## 2015-04-18 HISTORY — DX: Gastro-esophageal reflux disease without esophagitis: K21.9

## 2015-04-18 HISTORY — DX: Anxiety disorder, unspecified: F41.9

## 2015-04-18 HISTORY — DX: Low back pain, unspecified: M54.50

## 2015-04-18 HISTORY — DX: Major depressive disorder, single episode, unspecified: F32.9

## 2015-04-18 HISTORY — DX: Unspecified osteoarthritis, unspecified site: M19.90

## 2015-04-18 HISTORY — DX: Other chronic pain: G89.29

## 2015-04-18 HISTORY — DX: Migraine, unspecified, not intractable, without status migrainosus: G43.909

## 2015-04-18 HISTORY — DX: Depression, unspecified: F32.A

## 2015-04-18 MED ORDER — SODIUM CHLORIDE 0.9 % IV SOLN
Freq: Once | INTRAVENOUS | Status: AC
  Administered 2015-04-18: via INTRAVENOUS

## 2015-04-18 NOTE — ED Provider Notes (Signed)
CSN: [id number]     Arrival date & time 04/18/15  2318 History   First MD Initiated Contact with Patient 04/18/15 2319     Chief Complaint  Patient presents with  . Chest Pain  . Shortness of Breath     (Consider location/radiation/quality/duration/timing/severity/associated sxs/prior Treatment) HPI Comments: Patient presents via EMS after having a syncopal episode after experiencing chest pain approximately 30 minutes after smoking a joint laced with cocaine.  He is supposed to be wearing a LifeVest which he did not have on at the time.  He states he also has not taken his evening dose of medications as he was on his way home and he takes before bed She has a history of CHF, COPD, hepatitis C, hypertension, atrial fibrillation.  His last echo cardiogram showed that he had an EF of 15%.  He is being evaluated for ICD. EMS administered one dose of sublingual nitroglycerin and patient is pain-free at this time  The history is provided by the patient and the EMS personnel.    Past Medical History  Diagnosis Date  . A-fib   . Hypertension   . CHF (congestive heart failure)     Nonischemic  . COPD (chronic obstructive pulmonary disease)   . Tobacco abuse   . Adenocarcinoma of colon     STAGE 1  . H/O ETOH abuse   . Heart murmur     "when I was youing"  . Pneumonia 2012-2014?    "I think I've had it twice" (04/19/2015)  . GERD (gastroesophageal reflux disease)   . Hepatitis C   . Migraine     "last one was back in the 1980's" (04/19/2015)  . Arthritis     "left hip" (04/19/2015)  . Chronic lower back pain   . Anxiety   . Depression   . Urinary urgency    Past Surgical History  Procedure Laterality Date  . Cardioversion      "I've had 2 in all" (04/19/2015)  . Colonoscopy    . Left and right heart catheterization with coronary angiogram N/A 10/23/2014    Procedure: LEFT AND RIGHT HEART CATHETERIZATION WITH CORONARY ANGIOGRAM;  Surgeon: Larey Dresser, MD;  Location: [facility] CATH LAB;   Service: Cardiovascular;  Laterality: N/A;  . Colonoscopy with propofol N/A 11/30/2014    Procedure: COLONOSCOPY WITH PROPOFOL;  Surgeon: Milus Banister, MD;  Location: [facility] ENDOSCOPY;  Service: Endoscopy;  Laterality: N/A;  . Cardioversion N/A 01/30/2015    Procedure: CARDIOVERSION;  Surgeon: Larey Dresser, MD;  Location: [facility] ENDOSCOPY;  Service: Cardiovascular;  Laterality: N/A;  . Cardiac catheterization  10/23/2014   Family History  Problem Relation Age of Onset  . Hypertension Mother    History  Substance Use Topics  . Smoking status: Light Tobacco Smoker -- 0.10 packs/day for 52 years    Types: Cigarettes    Last Attempt to Quit: 09/01/2014  . Smokeless tobacco: Never Used     Comment: 04/19/2015 "probably smoke 1 pack cigarettes/month"  . Alcohol Use: 0.0 oz/week    0 Standard drinks or equivalent per week     Comment: 04/19/2015 "I might drink a 40oz beer/month"    Review of Systems  Constitutional: Negative for fever and chills.  Respiratory: Positive for shortness of breath.   Cardiovascular: Positive for chest pain.  Gastrointestinal: Negative for nausea and vomiting.  Neurological: Positive for syncope.  All other systems reviewed and are negative.     Allergies  Review of patient's allergies  indicates no known allergies.  Home Medications   Prior to Admission medications   Medication Sig Start Date End Date Taking? Authorizing Provider  amiodarone (PACERONE) 200 MG tablet Take 1 tablet (200 mg total) by mouth daily. Patient taking differently: Take 200 mg by mouth 2 (two) times daily.  02/23/15  Yes Larey Dresser, MD  atorvastatin (LIPITOR) 20 MG tablet TAKE 1 TABLET BY MOUTH DAILY 02/21/15  Yes Larey Dresser, MD  baclofen (LIORESAL) 10 MG tablet Take 1 tablet (10 mg total) by mouth 3 (three) times daily. 03/30/15  Yes Olam Idler, MD  carvedilol (COREG) 6.25 MG tablet Take 1 tablet by mouth 2 (two) times daily. 03/27/15  Yes Historical Provider, MD  [place] 125  MCG tablet TAKE 1 TABLET BY MOUTH   DAILY 02/21/15  Yes Larey Dresser, MD  digoxin (LANOXIN) 0.125 MG tablet Take 0.125 mg by mouth daily.   Yes Historical Provider, MD  furosemide (LASIX) 40 MG tablet Take 40 mg by mouth daily. 12/01/14  Yes Historical Provider, MD  gabapentin (NEURONTIN) 100 MG capsule Take 1 capsule (100 mg total) by mouth 3 (three) times daily. 03/12/15  Yes Timmothy Euler, MD  lisinopril (PRINIVIL,ZESTRIL) 5 MG tablet Take 2 tablets (10 mg total) by mouth daily. 02/23/15  Yes Larey Dresser, MD  losartan (COZAAR) 25 MG tablet Take 1 tablet by mouth daily. 03/27/15  Yes Historical Provider, MD  mometasone-formoterol (DULERA) 200-5 MCG/ACT AERO Inhale 2 puffs into the lungs 2 (two) times daily as needed for wheezing. 01/06/15  Yes Leone Brand, MD  rivaroxaban (XARELTO) 20 MG TABS tablet Take 1 tablet (20 mg total) by mouth daily with supper. 11/21/14  Yes Larey Dresser, MD  traMADol (ULTRAM) 50 MG tablet Take 1 tablet (50 mg total) by mouth every 6 (six) hours as needed. 03/30/15  Yes Olam Idler, MD  Umeclidinium Bromide (INCRUSE ELLIPTA) 62.5 MCG/INH AEPB Inhale 1 Inhaler into the lungs daily. 02/20/15  Yes Olam Idler, MD  albuterol-ipratropium (COMBIVENT) 18-103 MCG/ACT inhaler Inhale 1 puff into the lungs every 4 (four) hours as needed for wheezing or shortness of breath. 09/12/14   Olam Idler, MD  spironolactone (ALDACTONE) 25 MG tablet Take 0.5 tablets (12.5 mg total) by mouth daily. Patient not taking: Reported on 04/19/2015 11/28/14   Anderson Malta Piepenbrink, PA-C   BP 158/72 mmHg  Pulse 53  Temp(Src) 97.8 F (36.6 C) (Oral)  Resp 18  Ht 6\' 1"  (1.854 m)  Wt 202 lb (91.627 kg)  BMI 26.66 kg/m2  SpO2 100% Physical Exam  Constitutional: He is oriented to person, place, and time. He appears well-developed and well-nourished.  HENT:  Head: Normocephalic.  Eyes: Pupils are equal, round, and reactive to light.  Neck: Normal range of motion.  Cardiovascular: Normal  rate and regular rhythm.   Pulmonary/Chest: Effort normal and breath sounds normal. He exhibits no tenderness.  Abdominal: Soft.  Musculoskeletal: Normal range of motion.  Neurological: He is alert and oriented to person, place, and time.  Skin: Skin is warm.  Vitals reviewed.   ED Course  Procedures (including critical care time) Labs Review Labs Reviewed  CBC WITH DIFFERENTIAL/PLATELET - Abnormal; Notable for the following:    RBC 4.08 (*)    MCH 34.1 (*)    All other components within normal limits  URINE RAPID DRUG SCREEN, HOSP PERFORMED - Abnormal; Notable for the following:    Benzodiazepines POSITIVE (*)    All other components  within normal limits  I-STAT CHEM 8, ED - Abnormal; Notable for the following:    Glucose, Bld 108 (*)    Calcium, Ion 1.11 (*)    All other components within normal limits  TROPONIN I  TROPONIN I  TROPONIN I  BASIC METABOLIC PANEL  CBC  I-STAT TROPOININ, ED  Randolm Idol, ED    Imaging Review Dg Chest 2 View  04/19/2015   CLINICAL DATA:  Acute chest pain today.  EXAM: CHEST  2 VIEW  COMPARISON:  Mar 14, 2015.  FINDINGS: The heart size and mediastinal contours are within normal limits. Nodular density seen projected over right lung base most consistent with overlying nipple shadow. Otherwise both lungs are clear. No pneumothorax or pleural effusion is noted. Degenerative change of right acromioclavicular joint is noted.  IMPRESSION: Nodular density seen over right lung base most consistent with overlying nipple shadow. Repeat radiograph with nipple markers is recommended to rule out pulmonary nodule. No other significant cardiopulmonary abnormality seen.   Electronically Signed   By: Marijo Conception, M.D.   On: 04/19/2015 18:27     EKG Interpretation   Date/Time:  Wednesday April 18 2015 23:32:56 EDT Ventricular Rate:  73 PR Interval:  174 QRS Duration: 94 QT Interval:  418 QTC Calculation: 460 R Axis:   57 Text Interpretation:  Normal  sinus rhythm Left ventricular hypertrophy  with repolarization abnormality Abnormal ECG ED PHYSICIAN INTERPRETATION  AVAILABLE IN [facility] Confirmed by TEST, Record ([id number]) on  04/19/2015 8:51:23 AM     Patient stated at 3:00, when repeat troponin was being drawn that he had some chest discomfort.  He was given nitroglycerin with total resolution.  On my reexamination, patient is complaining of bilateral hip pain, stating that they've taken him off all of his pain medication and plan on putting him in physical therapy, but he states it is worse tonight than normal.  His daughter brought his LifeVest which he has now put on, it has not activated MDM   Final diagnoses:  Chest pain, unspecified chest pain type  Syncope, unspecified syncope type        Junius Creamer, NP 04/19/15 1958  Julianne Rice, MD 04/21/15 [phone]

## 2015-04-18 NOTE — ED Notes (Signed)
Pt to call daughter so that she can get his life vest from 'extended stay'

## 2015-04-18 NOTE — ED Notes (Signed)
Per EMS: Pt admits to [facility] and Cocaine use this evening. Complaining of chest pain and SOB ~79mins post use. Pt supposed to wear an external defibrillator, pt states he was NOT wearing defibrillator during episode. EMS gave 325 ASA and 1 Nitro enroute, no change. Pt A/Ox4 BP = 140/90

## 2015-04-19 ENCOUNTER — Observation Stay (HOSPITAL_COMMUNITY): Payer: Medicare Other

## 2015-04-19 ENCOUNTER — Encounter (HOSPITAL_COMMUNITY): Payer: Self-pay | Admitting: Cardiology

## 2015-04-19 ENCOUNTER — Encounter (HOSPITAL_COMMUNITY): Payer: Self-pay | Admitting: General Practice

## 2015-04-19 DIAGNOSIS — R079 Chest pain, unspecified: Secondary | ICD-10-CM | POA: Diagnosis not present

## 2015-04-19 DIAGNOSIS — R55 Syncope and collapse: Secondary | ICD-10-CM

## 2015-04-19 LAB — I-STAT TROPONIN, ED
TROPONIN I, POC: 0.03 ng/mL (ref 0.00–0.08)
TROPONIN I, POC: 0.04 ng/mL (ref 0.00–0.08)

## 2015-04-19 LAB — I-STAT CHEM 8, ED
BUN: 16 mg/dL (ref 6–20)
CREATININE: 0.9 mg/dL (ref 0.61–1.24)
Calcium, Ion: 1.11 mmol/L — ABNORMAL LOW (ref 1.13–1.30)
Chloride: 106 mmol/L (ref 101–111)
GLUCOSE: 108 mg/dL — AB (ref 65–99)
HCT: 43 % (ref 39.0–52.0)
HEMOGLOBIN: 14.6 g/dL (ref 13.0–17.0)
Potassium: 3.7 mmol/L (ref 3.5–5.1)
Sodium: 142 mmol/L (ref 135–145)
TCO2: 21 mmol/L (ref 0–100)

## 2015-04-19 LAB — TROPONIN I
Troponin I: <0.03 ng/mL (ref <0.031)
Troponin I: <0.03 ng/mL (ref <0.031)

## 2015-04-19 LAB — CBC WITH DIFFERENTIAL/PLATELET
BASOS PCT: 0 % (ref 0–1)
Basophils Absolute: 0 10*3/uL (ref 0.0–0.1)
EOS PCT: 2 % (ref 0–5)
Eosinophils Absolute: 0.2 10*3/uL (ref 0.0–0.7)
HEMATOCRIT: 39 % (ref 39.0–52.0)
Hemoglobin: 13.9 g/dL (ref 13.0–17.0)
Lymphocytes Relative: 24 % (ref 12–46)
Lymphs Abs: 1.9 10*3/uL (ref 0.7–4.0)
MCH: 34.1 pg — AB (ref 26.0–34.0)
MCHC: 35.6 g/dL (ref 30.0–36.0)
MCV: 95.6 fL (ref 78.0–100.0)
MONO ABS: 0.6 10*3/uL (ref 0.1–1.0)
Monocytes Relative: 7 % (ref 3–12)
Neutro Abs: 5.2 10*3/uL (ref 1.7–7.7)
Neutrophils Relative %: 67 % (ref 43–77)
Platelets: 175 10*3/uL (ref 150–400)
RBC: 4.08 MIL/uL — AB (ref 4.22–5.81)
RDW: 12.2 % (ref 11.5–15.5)
WBC: 7.9 10*3/uL (ref 4.0–10.5)

## 2015-04-19 LAB — RAPID URINE DRUG SCREEN, HOSP PERFORMED
AMPHETAMINES: NOT DETECTED
BENZODIAZEPINES: POSITIVE — AB
Barbiturates: NOT DETECTED
COCAINE: NOT DETECTED
OPIATES: NOT DETECTED
Tetrahydrocannabinol: NOT DETECTED

## 2015-04-19 MED ORDER — DIGOXIN 125 MCG PO TABS
0.1250 mg | ORAL_TABLET | Freq: Every day | ORAL | Status: DC
  Administered 2015-04-19 – 2015-04-20 (×2): 0.125 mg via ORAL
  Filled 2015-04-19 (×2): qty 1

## 2015-04-19 MED ORDER — GABAPENTIN 100 MG PO CAPS
100.0000 mg | ORAL_CAPSULE | Freq: Three times a day (TID) | ORAL | Status: DC
  Administered 2015-04-19 – 2015-04-20 (×4): 100 mg via ORAL
  Filled 2015-04-19 (×5): qty 1

## 2015-04-19 MED ORDER — SPIRONOLACTONE 12.5 MG HALF TABLET
12.5000 mg | ORAL_TABLET | Freq: Every day | ORAL | Status: DC
  Administered 2015-04-19: 12.5 mg via ORAL
  Filled 2015-04-19 (×2): qty 1

## 2015-04-19 MED ORDER — UMECLIDINIUM BROMIDE 62.5 MCG/INH IN AEPB: 1.0000 | INHALATION_SPRAY | Freq: Every day | RESPIRATORY_TRACT | Status: DC

## 2015-04-19 MED ORDER — SODIUM CHLORIDE 0.9 % IV SOLN: 250.0000 mL | INTRAVENOUS | Status: DC | PRN

## 2015-04-19 MED ORDER — BACLOFEN 10 MG PO TABS
10.0000 mg | ORAL_TABLET | Freq: Three times a day (TID) | ORAL | Status: DC | PRN
  Filled 2015-04-19: qty 1

## 2015-04-19 MED ORDER — AMIODARONE HCL 200 MG PO TABS
200.0000 mg | ORAL_TABLET | Freq: Every day | ORAL | Status: DC
  Administered 2015-04-19 – 2015-04-20 (×2): 200 mg via ORAL
  Filled 2015-04-19 (×2): qty 1

## 2015-04-19 MED ORDER — IPRATROPIUM-ALBUTEROL 0.5-2.5 (3) MG/3ML IN SOLN: 3.0000 mL | RESPIRATORY_TRACT | Status: DC | PRN

## 2015-04-19 MED ORDER — LISINOPRIL 10 MG PO TABS
10.0000 mg | ORAL_TABLET | Freq: Every day | ORAL | Status: DC
  Administered 2015-04-19 – 2015-04-20 (×2): 10 mg via ORAL
  Filled 2015-04-19 (×2): qty 1

## 2015-04-19 MED ORDER — ATORVASTATIN CALCIUM 20 MG PO TABS
20.0000 mg | ORAL_TABLET | Freq: Every day | ORAL | Status: DC
  Administered 2015-04-19 – 2015-04-20 (×2): 20 mg via ORAL
  Filled 2015-04-19 (×2): qty 1

## 2015-04-19 MED ORDER — SODIUM CHLORIDE 0.9 % IJ SOLN
3.0000 mL | Freq: Two times a day (BID) | INTRAMUSCULAR | Status: DC
  Administered 2015-04-19 (×2): 3 mL via INTRAVENOUS

## 2015-04-19 MED ORDER — CARVEDILOL 6.25 MG PO TABS
6.2500 mg | ORAL_TABLET | Freq: Two times a day (BID) | ORAL | Status: DC
  Administered 2015-04-19 – 2015-04-20 (×3): 6.25 mg via ORAL
  Filled 2015-04-19 (×4): qty 1

## 2015-04-19 MED ORDER — MOMETASONE FURO-FORMOTEROL FUM 200-5 MCG/ACT IN AERO
2.0000 | INHALATION_SPRAY | Freq: Two times a day (BID) | RESPIRATORY_TRACT | Status: DC
  Administered 2015-04-19 – 2015-04-20 (×2): 2 via RESPIRATORY_TRACT
  Filled 2015-04-19: qty 8.8

## 2015-04-19 MED ORDER — AMLODIPINE BESYLATE 5 MG PO TABS: 5.0000 mg | ORAL_TABLET | Freq: Every day | ORAL | Status: DC

## 2015-04-19 MED ORDER — SODIUM CHLORIDE 0.9 % IJ SOLN: 3.0000 mL | INTRAMUSCULAR | Status: DC | PRN

## 2015-04-19 MED ORDER — SODIUM CHLORIDE 0.9 % IJ SOLN
3.0000 mL | Freq: Two times a day (BID) | INTRAMUSCULAR | Status: DC
  Administered 2015-04-19 – 2015-04-20 (×3): 3 mL via INTRAVENOUS

## 2015-04-19 MED ORDER — HYDROCODONE-ACETAMINOPHEN 5-325 MG PO TABS
1.0000 | ORAL_TABLET | Freq: Once | ORAL | Status: AC
  Administered 2015-04-19: 1 via ORAL
  Filled 2015-04-19: qty 1

## 2015-04-19 MED ORDER — CARVEDILOL 6.25 MG PO TABS
6.2500 mg | ORAL_TABLET | Freq: Two times a day (BID) | ORAL | Status: DC
  Filled 2015-04-19: qty 1

## 2015-04-19 MED ORDER — TRAMADOL HCL 50 MG PO TABS: 50.0000 mg | ORAL_TABLET | Freq: Four times a day (QID) | ORAL | Status: DC | PRN

## 2015-04-19 MED ORDER — FUROSEMIDE 40 MG PO TABS
40.0000 mg | ORAL_TABLET | Freq: Every day | ORAL | Status: DC
  Administered 2015-04-19 – 2015-04-20 (×2): 40 mg via ORAL
  Filled 2015-04-19 (×2): qty 1

## 2015-04-19 MED ORDER — RIVAROXABAN 20 MG PO TABS
20.0000 mg | ORAL_TABLET | Freq: Every day | ORAL | Status: DC
  Administered 2015-04-19 – 2015-04-20 (×2): 20 mg via ORAL
  Filled 2015-04-19 (×2): qty 1

## 2015-04-19 MED ORDER — IPRATROPIUM-ALBUTEROL 18-103 MCG/ACT IN AERO: 1.0000 | INHALATION_SPRAY | RESPIRATORY_TRACT | Status: DC | PRN

## 2015-04-19 MED ORDER — NITROGLYCERIN 0.4 MG SL SUBL
0.4000 mg | SUBLINGUAL_TABLET | SUBLINGUAL | Status: DC | PRN
  Administered 2015-04-19: 0.4 mg via SUBLINGUAL
  Filled 2015-04-19: qty 1

## 2015-04-19 NOTE — ED Notes (Signed)
PTs daughter has brought the pts life vest to bedside.

## 2015-04-19 NOTE — ED Notes (Signed)
Cardiology at bedside.
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
## 2015-04-19 NOTE — ED Provider Notes (Signed)
Pt with h/o chest pain, sob and syncope after using thc and cocaine last evening, signed out to me by overnight PA, symptoms currently improved.  Has been evaluated by cardiology who recommends medical admission for overnight observation.  Will call for admission to [facility] who his his pcp (Dr. Berkley Harvey).  Discussed with Dr. Ardelia Mems, accepts for admission. Temp orders placed for tele bed.  Evalee Jefferson, PA-C 04/19/15 [id number]  Pamella Pert, MD 04/19/15 [phone]

## 2015-04-19 NOTE — ED Notes (Signed)
THis nurse assisted pt getting life vest on.

## 2015-04-19 NOTE — ED Notes (Signed)
Pts daughter is bringing his life vest to the hospital for him.

## 2015-04-19 NOTE — Progress Notes (Signed)
Dr Aundra Dubin notified pt in ED for syncopal episode, pt no showed for echo and f/u with HF clinic appts on Tue 6/28

## 2015-04-20 ENCOUNTER — Observation Stay (HOSPITAL_COMMUNITY): Payer: Medicare Other

## 2015-04-20 DIAGNOSIS — R079 Chest pain, unspecified: Secondary | ICD-10-CM

## 2015-04-20 DIAGNOSIS — I480 Paroxysmal atrial fibrillation: Secondary | ICD-10-CM

## 2015-04-20 DIAGNOSIS — I5022 Chronic systolic (congestive) heart failure: Secondary | ICD-10-CM | POA: Diagnosis not present

## 2015-04-20 DIAGNOSIS — R55 Syncope and collapse: Secondary | ICD-10-CM | POA: Diagnosis not present

## 2015-04-20 DIAGNOSIS — R0602 Shortness of breath: Secondary | ICD-10-CM | POA: Diagnosis not present

## 2015-04-20 DIAGNOSIS — B1920 Unspecified viral hepatitis C without hepatic coma: Secondary | ICD-10-CM | POA: Diagnosis not present

## 2015-04-20 DIAGNOSIS — R918 Other nonspecific abnormal finding of lung field: Secondary | ICD-10-CM | POA: Diagnosis not present

## 2015-04-20 LAB — CBC
HCT: 40.6 % (ref 39.0–52.0)
HEMOGLOBIN: 14 g/dL (ref 13.0–17.0)
MCH: 33.9 pg (ref 26.0–34.0)
MCHC: 34.5 g/dL (ref 30.0–36.0)
MCV: 98.3 fL (ref 78.0–100.0)
Platelets: 159 10*3/uL (ref 150–400)
RBC: 4.13 MIL/uL — AB (ref 4.22–5.81)
RDW: 12.3 % (ref 11.5–15.5)
WBC: 7.2 10*3/uL (ref 4.0–10.5)

## 2015-04-20 LAB — BASIC METABOLIC PANEL
ANION GAP: 7 (ref 5–15)
BUN: 13 mg/dL (ref 6–20)
CO2: 28 mmol/L (ref 22–32)
Calcium: 8.8 mg/dL — ABNORMAL LOW (ref 8.9–10.3)
Chloride: 103 mmol/L (ref 101–111)
Creatinine, Ser: 0.97 mg/dL (ref 0.61–1.24)
Glucose, Bld: 113 mg/dL — ABNORMAL HIGH (ref 65–99)
Potassium: 4.3 mmol/L (ref 3.5–5.1)
SODIUM: 138 mmol/L (ref 135–145)

## 2015-04-20 LAB — TROPONIN I

## 2015-04-20 MED ORDER — SPIRONOLACTONE 25 MG PO TABS
25.0000 mg | ORAL_TABLET | Freq: Every day | ORAL | Status: DC
  Administered 2015-04-20: 25 mg via ORAL
  Filled 2015-04-20: qty 1

## 2015-04-20 NOTE — Discharge Instructions (Signed)
You were admitted to the hospital for chest pain and syncope. You received an EKG which revealed no changes in comparison to the prior EKG. You also received an echocardiogram which has shown improvement in your heart function. Chest X-Ray was unremarkable. You were seen by the cardiologist who would like for you to wear the Life Vest and to continue taking at home medications. It is important that you follow up with your cardiologist and with your primary care doctor.  GET HELP RIGHT AWAY IF:   You have more pain or pain that spreads to your arm, neck, jaw, back, or belly (abdomen).  You have shortness of breath.  You cough more than usual or cough up blood.  You have very bad back or belly pain.  You feel sick to your stomach (nauseous) or throw up (vomit).  You have very bad weakness.  You pass out (faint).  You have chills. This is an emergency. Do not wait to see if the problems will go away. Call your local emergency services (911 in [place] Do not drive yourself to the hospital. MAKE SURE YOU:   Understand these instructions.  Will watch your condition.  Will get help right away if you are not doing well or get worse. Document Released: 03/24/2008 Document Revised: 10/11/2013 Document Reviewed: 03/24/2008 [facility] Patient Information 2015 [place], [place]. This information is not intended to replace advice given to you by your health care provider. Make sure you discuss any questions you have with your health care provider.

## 2015-04-20 NOTE — Progress Notes (Signed)
  Echocardiogram 2D Echocardiogram has been performed.  Paul Clayton 04/20/2015, 10:23 AM

## 2015-04-20 NOTE — Discharge Summary (Signed)
[facility] Discharge Summary  Patient name: Paul Clayton Medical record number: [id number] Date of birth: 10-27-1945 Age: 69 y.o. Gender: male Date of Admission: 04/18/2015  Date of Discharge: 04/20/2015 Admitting Physician: Lind Covert, MD  Primary Care Provider: [name], MD Consultants: cardiology  Indication for Hospitalization: chest pain and syncope  Discharge Diagnoses/Problem List:  Nonischemic Cardiomyopathy Paroxysmal A-fib HTN Hep C Hepatitis C  Disposition: discharge home  Discharge Condition: stable  Discharge Exam: BP 111/60 mmHg  Pulse 67  Temp(Src) 98.4 F (36.9 C) (Oral)  Resp 17  Ht 6\' 1"  (1.854 m)  Wt 197 lb 14.4 oz (89.767 kg)  BMI 26.12 kg/m2  SpO2 99% Physical Exam General: NAD Cardiovascular: RRR, no rubs, gallops, or murmurs Respiratory: clear to auscultation bilaterally Abdomen: soft, NTTP, no masses or organomegaly MSK: atraumatic extremities, no edema bilateral lower extremities Skin: no rashes Neuro: grossly nonfocal speech normal Psych: normal range of affect, well groomed, speech normal in rate and volume, normal eye contact   Brief Hospital Course:  Patient is a 69 yo male who presented with chest pain and syncope most likely related to a cocaine and marijuana use. He was not wearing his LifeVest at the time. PMH significant for nonischemic cardiomyopathy (EF 15%), paroxysmal afib, colon cancer, COPD.   Patient was seen in the ED after receiving one dose of sublingual nitroglycerin by EMS. On arrival he was in no pain and his vitals were stable. Physical exam was unremarkable. He received an EKG which was normal sinus rhythm and unchanged from a prior EKG. CXR was not done initially. Troponin levels were negative x 3. UDS was negative for cocaine and marijuana but positive for Benzodiazepine.   He was admitted and received an echocardiogram. Echocardiogram from Nov 2015 revealed EF of 15%, the  echocardiogram performed today showed an improved EF of 30-35%. CXR was performed and initially revealed nodular density seen over the right lung base most consistent with overlying nipple shadow. CXR was done again with nipple markers and was unremarkable.   Patient was seen by Cardiology who increased spironolactone to 25mg  daily and encouraged patient to continue to wear Lifevest. He remained in normal sinus rhythm for the duration of his stay in the hospital. Patient had no chest pain but continued to complain of L hip pain which is a chronic issue for which he takes medication and receives physical therapy.   Patient was discharged in stable condition. Discharge instructions and return precautions were reviewed with patient. Patient voiced understanding with taking prescribed medications and wearing LifeVest. Additionally, we discussed his substance abuse and encouraged him to stop using illicit drugs.    Issues for Follow Up:  1. Nonischemic Cardiomyopathy and Paroxysmal afib- continue to follow up with outpatient cardiology. Continue taking prescribed medication and wearing LifeVest. 2. Substance Abuse- Continue to encourage patient to cease using illicit substances.   Significant Procedures: None  Significant Labs and Imaging:   Recent Labs Lab 04/18/15 2356 04/18/15 2359 04/20/15 0016  WBC  --  7.9 7.2  HGB 14.6 13.9 14.0  HCT 43.0 39.0 40.6  PLT  --  175 159    Recent Labs Lab 04/18/15 2356 04/20/15 0016  NA 142 138  K 3.7 4.3  CL 106 103  CO2  --  28  GLUCOSE 108* 113*  BUN 16 13  CREATININE 0.90 0.97  CALCIUM  --  8.8*    Troponin: (6/30 14:37) <0.03,  (6/30 19:55) <0.03, (7/01 00:16) <  0.03 UDS: (6/30 17:56) Opiates NONE DETECTED  NONE DETECTED NONE DETECTED NONE DETECTED     Cocaine NONE DETECTED  NONE DETECTED NONE DETECTED NONE DETECTED    Benzodiazepines NONE DETECTED  POSITIVE (A) NONE DETECTED NONE DETECTED    Amphetamines NONE DETECTED  NONE  DETECTED NONE DETECTED NONE DETECTED    Tetrahydrocannabinol NONE DETECTED  NONE DETECTED NONE DETECTED NONE DETECTED    Barbiturates NONE DETECTED  NONE DETECTED NONE DETECTEDCM NONE DETECTEDCM         EKG 07/01- normal sinus rhythm  04/19/2015 CXR:  CLINICAL DATA:  Acute chest pain today.  EXAM: CHEST  2 VIEW  COMPARISON:  Mar 14, 2015.  FINDINGS: The heart size and mediastinal contours are within normal limits. Nodular density seen projected over right lung base most consistent with overlying nipple shadow. Otherwise both lungs are clear. No pneumothorax or pleural effusion is noted. Degenerative change of right acromioclavicular joint is noted.  IMPRESSION: Nodular density seen over right lung base most consistent with overlying nipple shadow. Repeat radiograph with nipple markers is recommended to rule out pulmonary nodule. No other significant cardiopulmonary abnormality seen.   Electronically Signed   By: Marijo Conception, M.D.   On: 04/19/2015 18:27   04/20/2015 CXR Impression: FINDINGS: The heart size and mediastinal contours are within normal limits. Both lungs are clear. The visualized skeletal structures are unremarkable. Radiopaque nipple marker corresponds in location to nodular density seen on prior chest radiograph consistent with overlying nipple shadow. Radiopaque marker on the left also corresponds to overlying nipple shadow.  IMPRESSION: Radiopaque nipple markers correspond to nodular densities seen in lung bases consistent with overlying nipple shadow. No pulmonary nodules are noted. No acute cardiopulmonary abnormality seen.   Electronically Signed  By: Marijo Conception, M.D.  On: 04/20/2015 13:01   Results/Tests Pending at Time of Discharge: Echocardiogram  Discharge Medications:    Medication List    ASK your doctor about these medications        albuterol-ipratropium 18-103 MCG/ACT inhaler  Commonly known as:  COMBIVENT  Inhale 1 puff into the  lungs every 4 (four) hours as needed for wheezing or shortness of breath.     amiodarone 200 MG tablet  Commonly known as:  PACERONE  Take 1 tablet (200 mg total) by mouth daily.     atorvastatin 20 MG tablet  Commonly known as:  LIPITOR  TAKE 1 TABLET BY MOUTH DAILY     baclofen 10 MG tablet  Commonly known as:  LIORESAL  Take 1 tablet (10 mg total) by mouth 3 (three) times daily.     carvedilol 6.25 MG tablet  Commonly known as:  COREG  Take 1 tablet by mouth 2 (two) times daily.     digoxin 0.125 MG tablet  Commonly known as:  LANOXIN  Take 0.125 mg by mouth daily.     DIGOX 0.125 MG tablet  Generic drug:  digoxin  TAKE 1 TABLET BY MOUTH   DAILY     furosemide 40 MG tablet  Commonly known as:  LASIX  Take 40 mg by mouth daily.     gabapentin 100 MG capsule  Commonly known as:  NEURONTIN  Take 1 capsule (100 mg total) by mouth 3 (three) times daily.     lisinopril 5 MG tablet  Commonly known as:  PRINIVIL,ZESTRIL  Take 2 tablets (10 mg total) by mouth daily.     losartan 25 MG tablet  Commonly known as:  COZAAR  Take 1 tablet by mouth daily.     mometasone-formoterol 200-5 MCG/ACT Aero  Commonly known as:  DULERA  Inhale 2 puffs into the lungs 2 (two) times daily as needed for wheezing.     rivaroxaban 20 MG Tabs tablet  Commonly known as:  XARELTO  Take 1 tablet (20 mg total) by mouth daily with supper.     spironolactone 25 MG tablet  Commonly known as:  ALDACTONE  Take 0.5 tablets (12.5 mg total) by mouth daily.     traMADol 50 MG tablet  Commonly known as:  ULTRAM  Take 1 tablet (50 mg total) by mouth every 6 (six) hours as needed.     Umeclidinium Bromide 62.5 MCG/INH Aepb  Commonly known as:  INCRUSE ELLIPTA  Inhale 1 Inhaler into the lungs daily.        Discharge Instructions: Please refer to Patient Instructions section of EMR for full details.  Patient was counseled important signs and symptoms that should prompt return to medical care,  changes in medications, dietary instructions, activity restrictions, and follow up appointments.   Follow-Up Appointments:   Carlyle Dolly, MD 04/20/2015, 5:02 PM PGY-1, [place]

## 2015-04-20 NOTE — Progress Notes (Signed)
Patient ID: Paul Clayton, male   DOB: [birth year], 69 y.o.   MRN: [id number]   SUBJECTIVE: No problems overnight.  No dyspnea.  No arrhythmias.  No further lightheadedness/syncope.   Scheduled Meds: . amiodarone  200 mg Oral Daily  . atorvastatin  20 mg Oral Daily  . carvedilol  6.25 mg Oral BID  . digoxin  0.125 mg Oral Daily  . furosemide  40 mg Oral Daily  . gabapentin  100 mg Oral TID  . lisinopril  10 mg Oral Daily  . mometasone-formoterol  2 puff Inhalation BID  . rivaroxaban  20 mg Oral Q supper  . sodium chloride  3 mL Intravenous Q12H  . sodium chloride  3 mL Intravenous Q12H  . spironolactone  12.5 mg Oral Daily  . Umeclidinium Bromide  1 Inhaler Inhalation Daily   Continuous Infusions:  PRN Meds:.sodium chloride, baclofen, ipratropium-albuterol, nitroGLYCERIN, sodium chloride, traMADol    Filed Vitals:   04/19/15 2119 04/19/15 2246 04/20/15 0255 04/20/15 0615  BP:  146/74 152/70 137/85  Pulse:  57 55 60  Temp:   98 F (36.7 C) 97.3 F (36.3 C)  TempSrc:   Oral Oral  Resp:  16 18 18   Height:      Weight:    197 lb 14.4 oz (89.767 kg)  SpO2: 97% 100% 98% 99%    Intake/Output Summary (Last 24 hours) at 04/20/15 3474 Last data filed at 04/20/15 0200  Gross per 24 hour  Intake   1437 ml  Output      0 ml  Net   1437 ml    LABS: Basic Metabolic Panel:  Recent Labs  04/18/15 2356 04/20/15 0016  NA 142 138  K 3.7 4.3  CL 106 103  CO2  --  28  GLUCOSE 108* 113*  BUN 16 13  CREATININE 0.90 0.97  CALCIUM  --  8.8*   Liver Function Tests: No results for input(s): AST, ALT, ALKPHOS, BILITOT, PROT, ALBUMIN in the last 72 hours. No results for input(s): LIPASE, AMYLASE in the last 72 hours. CBC:  Recent Labs  04/18/15 2359 04/20/15 0016  WBC 7.9 7.2  NEUTROABS 5.2  --   HGB 13.9 14.0  HCT 39.0 40.6  MCV 95.6 98.3  PLT 175 159   Cardiac Enzymes:  Recent Labs  04/19/15 1437 04/19/15 1955 04/20/15 0016  TROPONINI <0.03 <0.03 <0.03    BNP: Invalid input(s): POCBNP D-Dimer: No results for input(s): DDIMER in the last 72 hours. Hemoglobin A1C: No results for input(s): HGBA1C in the last 72 hours. Fasting Lipid Panel: No results for input(s): CHOL, HDL, LDLCALC, TRIG, CHOLHDL, LDLDIRECT in the last 72 hours. Thyroid Function Tests: No results for input(s): TSH, T4TOTAL, T3FREE, THYROIDAB in the last 72 hours.  Invalid input(s): FREET3 Anemia Panel: No results for input(s): VITAMINB12, FOLATE, FERRITIN, TIBC, IRON, RETICCTPCT in the last 72 hours.  RADIOLOGY: Dg Chest 2 View  04/19/2015   CLINICAL DATA:  Acute chest pain today.  EXAM: CHEST  2 VIEW  COMPARISON:  Mar 14, 2015.  FINDINGS: The heart size and mediastinal contours are within normal limits. Nodular density seen projected over right lung base most consistent with overlying nipple shadow. Otherwise both lungs are clear. No pneumothorax or pleural effusion is noted. Degenerative change of right acromioclavicular joint is noted.  IMPRESSION: Nodular density seen over right lung base most consistent with overlying nipple shadow. Repeat radiograph with nipple markers is recommended to rule out pulmonary nodule. No other significant  cardiopulmonary abnormality seen.   Electronically Signed   By: Marijo Conception, M.D.   On: 04/19/2015 18:27    PHYSICAL EXAM General: NAD Neck: No JVD, no thyromegaly or thyroid nodule.  Lungs: Clear to auscultation bilaterally with normal respiratory effort. CV: Nondisplaced PMI.  Heart regular S1/S2, no S3/S4, no murmur.  No peripheral edema.  No carotid bruit.  Normal pedal pulses.  Abdomen: Soft, nontender, no hepatosplenomegaly, no distention.  Neurologic: Alert and oriented x 3.  Psych: Normal affect. Extremities: No clubbing or cyanosis.   TELEMETRY: Reviewed telemetry pt in NSR  ASSESSMENT AND PLAN: 69 yo with history of nonischemic cardiomyopathy (possibly due to prior cocaine abuse) and paroxysmal atrial fibrillation was  admitted after passing out.  1. Syncope: Patient was not wearing Lifevest.  He apparently smoked a "laced joint," possibly with crack cocaine in it, though UDS was negative for cocaine (positive for benzos).  He passed out shortly thereafter, came to, and called 911.  He has been monitored overnight with no arrhythmias.  I suspect the event was somehow related to substance abuse.  2. Chronic systolic CHF: EF 82% on last echo.  He is not volume overloaded on exam and and seems to be doing pretty well from this standpoint.  He has been taking his meds.  - Continue Coreg 6.25 mg bid, lisinopril 10 daily, digoxin 0.125 daily (check level), and Lasix 40 mg daily.  - Can increase spironolactone to 25 mg daily.  - I would not stop his beta blocker.  He understands the need to avoid cocaine and did not know that he was going to be exposed to cocaine.  He had been doing well avoiding it prior to this admission (and UDS actually was not positive).  - He should continue to wear Lifevest.  We will repeat echo today, if EF remains low, will have EP evaluate him for ICD as outpatient but substance abuse remains an issue (though I think he's trying).  3. Atrial fibrillation: Paroxysmal.  He remains in NSR.  Continue amiodarone and Xarelto.   Loralie Champagne 04/20/2015 8:27 AM

## 2015-04-30 ENCOUNTER — Other Ambulatory Visit: Payer: Self-pay | Admitting: *Deleted

## 2015-04-30 DIAGNOSIS — M5432 Sciatica, left side: Secondary | ICD-10-CM

## 2015-04-30 MED ORDER — BACLOFEN 10 MG PO TABS: 10.0000 mg | ORAL_TABLET | Freq: Three times a day (TID) | ORAL | Status: DC

## 2015-05-02 ENCOUNTER — Inpatient Hospital Stay: Payer: Medicare Other | Admitting: Family Medicine

## 2015-05-03 ENCOUNTER — Emergency Department (HOSPITAL_COMMUNITY): Payer: Medicare Other

## 2015-05-03 ENCOUNTER — Encounter (HOSPITAL_COMMUNITY): Payer: Self-pay | Admitting: Emergency Medicine

## 2015-05-03 ENCOUNTER — Inpatient Hospital Stay (HOSPITAL_COMMUNITY)
Admission: EM | Admit: 2015-05-03 | Discharge: 2015-05-04 | DRG: 392 | Disposition: A | Discharge status: Home / Self Care | Payer: Medicare Other | Attending: Family Medicine | Admitting: Family Medicine

## 2015-05-03 DIAGNOSIS — F419 Anxiety disorder, unspecified: Secondary | ICD-10-CM | POA: Diagnosis present

## 2015-05-03 DIAGNOSIS — I2510 Atherosclerotic heart disease of native coronary artery without angina pectoris: Secondary | ICD-10-CM | POA: Diagnosis present

## 2015-05-03 DIAGNOSIS — I480 Paroxysmal atrial fibrillation: Secondary | ICD-10-CM | POA: Diagnosis not present

## 2015-05-03 DIAGNOSIS — J449 Chronic obstructive pulmonary disease, unspecified: Secondary | ICD-10-CM | POA: Diagnosis present

## 2015-05-03 DIAGNOSIS — Z7901 Long term (current) use of anticoagulants: Secondary | ICD-10-CM

## 2015-05-03 DIAGNOSIS — I429 Cardiomyopathy, unspecified: Secondary | ICD-10-CM

## 2015-05-03 DIAGNOSIS — I5020 Unspecified systolic (congestive) heart failure: Secondary | ICD-10-CM

## 2015-05-03 DIAGNOSIS — I255 Ischemic cardiomyopathy: Secondary | ICD-10-CM | POA: Diagnosis present

## 2015-05-03 DIAGNOSIS — Z7951 Long term (current) use of inhaled steroids: Secondary | ICD-10-CM | POA: Diagnosis not present

## 2015-05-03 DIAGNOSIS — M1990 Unspecified osteoarthritis, unspecified site: Secondary | ICD-10-CM | POA: Diagnosis present

## 2015-05-03 DIAGNOSIS — Z79899 Other long term (current) drug therapy: Secondary | ICD-10-CM

## 2015-05-03 DIAGNOSIS — I5022 Chronic systolic (congestive) heart failure: Secondary | ICD-10-CM | POA: Diagnosis not present

## 2015-05-03 DIAGNOSIS — B1920 Unspecified viral hepatitis C without hepatic coma: Secondary | ICD-10-CM | POA: Diagnosis present

## 2015-05-03 DIAGNOSIS — Z590 Homelessness: Secondary | ICD-10-CM | POA: Diagnosis not present

## 2015-05-03 DIAGNOSIS — I10 Essential (primary) hypertension: Secondary | ICD-10-CM | POA: Diagnosis present

## 2015-05-03 DIAGNOSIS — N3941 Urge incontinence: Secondary | ICD-10-CM | POA: Diagnosis present

## 2015-05-03 DIAGNOSIS — R079 Chest pain, unspecified: Secondary | ICD-10-CM | POA: Diagnosis not present

## 2015-05-03 DIAGNOSIS — F329 Major depressive disorder, single episode, unspecified: Secondary | ICD-10-CM | POA: Diagnosis present

## 2015-05-03 DIAGNOSIS — Z85038 Personal history of other malignant neoplasm of large intestine: Secondary | ICD-10-CM

## 2015-05-03 DIAGNOSIS — B182 Chronic viral hepatitis C: Secondary | ICD-10-CM | POA: Diagnosis present

## 2015-05-03 DIAGNOSIS — I340 Nonrheumatic mitral (valve) insufficiency: Secondary | ICD-10-CM | POA: Diagnosis present

## 2015-05-03 DIAGNOSIS — M5410 Radiculopathy, site unspecified: Secondary | ICD-10-CM | POA: Diagnosis present

## 2015-05-03 DIAGNOSIS — R0602 Shortness of breath: Secondary | ICD-10-CM | POA: Diagnosis present

## 2015-05-03 DIAGNOSIS — F17210 Nicotine dependence, cigarettes, uncomplicated: Secondary | ICD-10-CM | POA: Diagnosis present

## 2015-05-03 DIAGNOSIS — I509 Heart failure, unspecified: Secondary | ICD-10-CM | POA: Diagnosis not present

## 2015-05-03 DIAGNOSIS — I428 Other cardiomyopathies: Secondary | ICD-10-CM

## 2015-05-03 DIAGNOSIS — R072 Precordial pain: Secondary | ICD-10-CM | POA: Diagnosis not present

## 2015-05-03 DIAGNOSIS — R32 Unspecified urinary incontinence: Secondary | ICD-10-CM | POA: Diagnosis present

## 2015-05-03 DIAGNOSIS — K219 Gastro-esophageal reflux disease without esophagitis: Secondary | ICD-10-CM | POA: Diagnosis present

## 2015-05-03 DIAGNOSIS — Z9119 Patient's noncompliance with other medical treatment and regimen: Secondary | ICD-10-CM | POA: Diagnosis present

## 2015-05-03 DIAGNOSIS — I420 Dilated cardiomyopathy: Secondary | ICD-10-CM

## 2015-05-03 LAB — RAPID URINE DRUG SCREEN, HOSP PERFORMED
AMPHETAMINES: NOT DETECTED
BARBITURATES: NOT DETECTED
BENZODIAZEPINES: NOT DETECTED
COCAINE: NOT DETECTED
Opiates: NOT DETECTED
Tetrahydrocannabinol: NOT DETECTED

## 2015-05-03 LAB — BASIC METABOLIC PANEL
Anion gap: 6 (ref 5–15)
BUN: 20 mg/dL (ref 6–20)
CHLORIDE: 110 mmol/L (ref 101–111)
CO2: 25 mmol/L (ref 22–32)
Calcium: 9.3 mg/dL (ref 8.9–10.3)
Creatinine, Ser: 0.98 mg/dL (ref 0.61–1.24)
GFR calc Af Amer: >60 mL/min (ref >60)
Glucose, Bld: 126 mg/dL — ABNORMAL HIGH (ref 65–99)
Potassium: 4 mmol/L (ref 3.5–5.1)
Sodium: 141 mmol/L (ref 135–145)

## 2015-05-03 LAB — TROPONIN I

## 2015-05-03 LAB — CBC
HCT: 40.2 % (ref 39.0–52.0)
Hemoglobin: 13.9 g/dL (ref 13.0–17.0)
MCH: 34.2 pg — AB (ref 26.0–34.0)
MCHC: 34.6 g/dL (ref 30.0–36.0)
MCV: 98.8 fL (ref 78.0–100.0)
Platelets: 184 10*3/uL (ref 150–400)
RBC: 4.07 MIL/uL — ABNORMAL LOW (ref 4.22–5.81)
RDW: 11.9 % (ref 11.5–15.5)
WBC: 7.6 10*3/uL (ref 4.0–10.5)

## 2015-05-03 LAB — I-STAT TROPONIN, ED: TROPONIN I, POC: 0.02 ng/mL (ref 0.00–0.08)

## 2015-05-03 LAB — D-DIMER, QUANTITATIVE (NOT AT ARMC)

## 2015-05-03 MED ORDER — RIVAROXABAN 20 MG PO TABS
20.0000 mg | ORAL_TABLET | Freq: Every day | ORAL | Status: DC
  Filled 2015-05-03: qty 1

## 2015-05-03 MED ORDER — AMIODARONE HCL 200 MG PO TABS
200.0000 mg | ORAL_TABLET | Freq: Every day | ORAL | Status: DC
  Administered 2015-05-04: 200 mg via ORAL
  Filled 2015-05-03: qty 1

## 2015-05-03 MED ORDER — ATORVASTATIN CALCIUM 20 MG PO TABS
20.0000 mg | ORAL_TABLET | Freq: Every day | ORAL | Status: DC
  Administered 2015-05-04: 20 mg via ORAL
  Filled 2015-05-03 (×2): qty 1

## 2015-05-03 MED ORDER — GABAPENTIN 100 MG PO CAPS
200.0000 mg | ORAL_CAPSULE | Freq: Three times a day (TID) | ORAL | Status: DC
  Administered 2015-05-03 – 2015-05-04 (×2): 200 mg via ORAL
  Filled 2015-05-03 (×5): qty 2

## 2015-05-03 MED ORDER — SPIRONOLACTONE 12.5 MG HALF TABLET
12.5000 mg | ORAL_TABLET | Freq: Every day | ORAL | Status: DC
  Administered 2015-05-04: 12.5 mg via ORAL
  Filled 2015-05-03 (×2): qty 1

## 2015-05-03 MED ORDER — BACLOFEN 10 MG PO TABS
10.0000 mg | ORAL_TABLET | Freq: Three times a day (TID) | ORAL | Status: DC
  Administered 2015-05-03 – 2015-05-04 (×2): 10 mg via ORAL
  Filled 2015-05-03 (×5): qty 1

## 2015-05-03 MED ORDER — ACETAMINOPHEN 325 MG PO TABS: 650.0000 mg | ORAL_TABLET | ORAL | Status: DC | PRN

## 2015-05-03 MED ORDER — MOMETASONE FURO-FORMOTEROL FUM 200-5 MCG/ACT IN AERO
2.0000 | INHALATION_SPRAY | Freq: Two times a day (BID) | RESPIRATORY_TRACT | Status: DC
  Administered 2015-05-04: 2 via RESPIRATORY_TRACT
  Filled 2015-05-03 (×2): qty 8.8

## 2015-05-03 MED ORDER — GI COCKTAIL ~~LOC~~
30.0000 mL | Freq: Once | ORAL | Status: AC
Start: 2015-05-03 — End: 2015-05-03
  Administered 2015-05-03: 30 mL via ORAL
  Filled 2015-05-03: qty 30

## 2015-05-03 MED ORDER — ONDANSETRON HCL 4 MG/2ML IJ SOLN: 4.0000 mg | Freq: Four times a day (QID) | INTRAMUSCULAR | Status: DC | PRN

## 2015-05-03 MED ORDER — UMECLIDINIUM BROMIDE 62.5 MCG/INH IN AEPB: 1.0000 | INHALATION_SPRAY | Freq: Every day | RESPIRATORY_TRACT | Status: DC

## 2015-05-03 MED ORDER — NITROGLYCERIN 0.4 MG SL SUBL
0.4000 mg | SUBLINGUAL_TABLET | SUBLINGUAL | Status: DC | PRN
  Administered 2015-05-03 (×2): 0.4 mg via SUBLINGUAL
  Filled 2015-05-03: qty 1

## 2015-05-03 MED ORDER — MORPHINE SULFATE 2 MG/ML IJ SOLN: 2.0000 mg | INTRAMUSCULAR | Status: DC | PRN

## 2015-05-03 MED ORDER — CARVEDILOL 6.25 MG PO TABS
6.2500 mg | ORAL_TABLET | Freq: Two times a day (BID) | ORAL | Status: DC
  Administered 2015-05-03 – 2015-05-04 (×2): 6.25 mg via ORAL
  Filled 2015-05-03 (×3): qty 1

## 2015-05-03 MED ORDER — PANTOPRAZOLE SODIUM 40 MG PO TBEC
40.0000 mg | DELAYED_RELEASE_TABLET | Freq: Every day | ORAL | Status: DC
  Administered 2015-05-03 – 2015-05-04 (×2): 40 mg via ORAL
  Filled 2015-05-03: qty 1

## 2015-05-03 MED ORDER — LOSARTAN POTASSIUM 25 MG PO TABS
25.0000 mg | ORAL_TABLET | Freq: Every day | ORAL | Status: DC
  Administered 2015-05-04: 25 mg via ORAL
  Filled 2015-05-03: qty 1

## 2015-05-03 MED ORDER — FUROSEMIDE 40 MG PO TABS
40.0000 mg | ORAL_TABLET | Freq: Every day | ORAL | Status: DC
  Administered 2015-05-04: 40 mg via ORAL
  Filled 2015-05-03 (×2): qty 1

## 2015-05-03 MED ORDER — GI COCKTAIL ~~LOC~~
30.0000 mL | Freq: Three times a day (TID) | ORAL | Status: DC | PRN
  Filled 2015-05-03: qty 30

## 2015-05-03 MED ORDER — SODIUM CHLORIDE 0.9 % IV BOLUS (SEPSIS)
250.0000 mL | Freq: Once | INTRAVENOUS | Status: AC
  Administered 2015-05-03: 250 mL via INTRAVENOUS

## 2015-05-03 MED ORDER — IPRATROPIUM-ALBUTEROL 0.5-2.5 (3) MG/3ML IN SOLN: 3.0000 mL | RESPIRATORY_TRACT | Status: DC | PRN

## 2015-05-03 MED ORDER — POLYETHYLENE GLYCOL 3350 17 G PO PACK
17.0000 g | PACK | Freq: Two times a day (BID) | ORAL | Status: DC
  Administered 2015-05-03 – 2015-05-04 (×2): 17 g via ORAL
  Filled 2015-05-03 (×3): qty 1

## 2015-05-03 NOTE — H&P (Signed)
[facility] Admission History and Physical Service Pager: [phone]  Patient name: Paul Clayton Medical record number: [id number] Date of birth: 12-03-1945 Age: 69 y.o. Gender: male  Primary Care Provider: [name], MD Consultants: cardiology (Dr. Mare Ferrari) Code Status: full code (discussed with pt upon admission)   Chief Complaint: chest pain   Assessment and Plan: CLEBURNE SAVINI is a 69 y.o. male presenting with chest pain . PMH is significant for nonischemic cardiomyopathy (EF 30-35%), paroxysmal afib, colon cancer, COPD.  Chest pain: most likely related to GERD. Postprandial, sharp, nonexertional, not relieved by rest. EKG without acute changes, initial troponins negative so doubt ACS picture. 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  - cycle troponins - monitor on telemetry - continue home meds: carvedilol, digoxin, lasix  Paroxysmal afib: currently NSR - continue amiodarone (200mg  daily) - continue xarelto  Hip pain: ongoing treatment as outpatient for L radicular pain, recent plan was for physical therapy referral. - continue baclofen TID prn, gabapentin TID - tramadol q6h prn - PT consult  COPD: stable. No acute exacerbation -Continue prn duonebs, daily inhaled anticholinergic, dulera BID  Urinary incontinence: pt reports incontinence occasionally for 6-7 months. Not an acute issue. -Will discuss trial flomax therapy versus outpatient workup  Chronic hep C: consider outpatient referral to infectious disease for possible treatment (although unclear if he would be a candidate for this with all his cardiac comorbidities)   FEN/GI: heart healthy diet, SLIV Prophylaxis: on xarelto  Disposition: admit to telemetry  History of Present Illness: Paul Clayton is a 69 y.o. male presenting with chest pain that began this morning after he ate a beef sandwich and took his  medication. He described the pain as sharp and was on the right side of his chest and then radiated to the left. No alleviating or aggravating factors. He says the pain comes and goes. Denies heartburn or upset stomach. Pt stated when the pain hit his chest he felt like he needed to have a bowel movement and he became nauseas but did not vomit.  Yesterday he felt "tingling" in both of his arms.He has been wearing his life vest as advised by his cardiologist. He admits to taking his medication like he's supposed to. States he is eating and drinking well, stooling normally. Denies diarrhea or vomiting.  Endorses chronic fatigue and shortness of breath with exertion. He has been using inhaler.  Does endorse urge urinary incontinence for the last 5-6 months. He will feel the need to urinate and then before he can make it to the bathroom, he will urinate on himself. Additionally, when he goes to urinate he does not completely empty his bladder. Admits to dry cough at night for 1 month and he needs to prop himself up with pillows in bed. Has L sided sciatica pain for which he has been seen but he states he would like physical therapy and possibly a cane due to trouble with ambulation. Patient is a smoker and is trying to quit. He has a history of illicit drug and alcohol use but denies any use recently. He presented to the ED from EMS and was given aspirin and  nitroglycerin and the pain resolved for a little but then came back. An EKG was performed which showed LVH with repolarization changes which were similar to his previous one in 2015. Troponin negative x2. D-Dimer negative. UDS negative.   Review Of Systems: Per  HPI, otherwise 12 point review of systems was performed and was unremarkable.  Patient Active Problem List   Diagnosis Date Noted  . Urinary incontinence 05/03/2015  . Sciatica of left side 03/12/2015  . Nonischemic cardiomyopathy 03/05/2015  . Pain in the chest 03/01/2015  . Elevated troponin    . Paroxysmal atrial fibrillation 02/25/2015  . HF (heart failure) 11/10/2014  . Near syncope 11/09/2014  . Hx of substance abuse 10/30/2014  . SOB (shortness of breath)   . Acute decompensated heart failure   . Systolic CHF, chronic   . Atrial fibrillation with rapid ventricular response   . Faintness   . Chronic systolic heart failure   . Persistent atrial fibrillation   . Chronic systolic CHF (congestive heart failure) 10/17/2014  . Homeless single person   . Atrial fibrillation with RVR 10/13/2014  . High risk social situation 09/30/2014  . Colon cancer 09/26/2014  . Dyspnea 09/22/2014  . Chest pain 09/16/2014  . Syncope   . COPD exacerbation   . HFrEF (heart failure with reduced ejection fraction) 09/12/2014  . A-fib 09/12/2014  . COPD (chronic obstructive pulmonary disease) 09/12/2014   Past Medical History: Past Medical History  Diagnosis Date  . A-fib   . Hypertension   . CHF (congestive heart failure)     Nonischemic  . COPD (chronic obstructive pulmonary disease)   . Tobacco abuse   . Adenocarcinoma of colon     STAGE 1  . H/O ETOH abuse   . Heart murmur     "when I was youing"  . Pneumonia 2012-2014?    "I think I've had it twice" (04/19/2015)  . GERD (gastroesophageal reflux disease)   . Hepatitis C   . Migraine     "last one was back in the 1980's" (04/19/2015)  . Arthritis     "left hip" (04/19/2015)  . Chronic lower back pain   . Anxiety   . Depression   . Urinary urgency    Past Surgical History: Past Surgical History  Procedure Laterality Date  . Cardioversion      "I've had 2 in all" (04/19/2015)  . Colonoscopy    . Left and right heart catheterization with coronary angiogram N/A 10/23/2014    Procedure: LEFT AND RIGHT HEART CATHETERIZATION WITH CORONARY ANGIOGRAM;  Surgeon: Larey Dresser, MD;  Location: [facility] CATH LAB;  Service: Cardiovascular;  Laterality: N/A;  . Colonoscopy with propofol N/A 11/30/2014    Procedure: COLONOSCOPY WITH PROPOFOL;   Surgeon: Milus Banister, MD;  Location: [facility] ENDOSCOPY;  Service: Endoscopy;  Laterality: N/A;  . Cardioversion N/A 01/30/2015    Procedure: CARDIOVERSION;  Surgeon: Larey Dresser, MD;  Location: [facility] ENDOSCOPY;  Service: Cardiovascular;  Laterality: N/A;  . Cardiac catheterization  10/23/2014   Social History: History  Substance Use Topics  . Smoking status: Light Tobacco Smoker -- 0.10 packs/day for 52 years    Types: Cigarettes    Last Attempt to Quit: 09/01/2014  . Smokeless tobacco: Never Used     Comment: 04/19/2015 "probably smoke 1 pack cigarettes/month"  . Alcohol Use: 0.0 oz/week    0 Standard drinks or equivalent per week     Comment: 04/19/2015 "I might drink a 40oz beer/month"   Additional social history: drinks one day per week  Please also refer to relevant sections of EMR.  Family History: Family History  Problem Relation Age of Onset  . Hypertension Mother    Allergies and Medications: No Known Allergies No current facility-administered  medications on file prior to encounter.   Current Outpatient Prescriptions on File Prior to Encounter  Medication Sig Dispense Refill  . albuterol-ipratropium (COMBIVENT) 18-103 MCG/ACT inhaler Inhale 1 puff into the lungs every 4 (four) hours as needed for wheezing or shortness of breath. 1 Inhaler 1  . baclofen (LIORESAL) 10 MG tablet Take 1 tablet (10 mg total) by mouth 3 (three) times daily. 30 each 0  . carvedilol (COREG) 6.25 MG tablet Take 1 tablet by mouth 2 (two) times daily.    . furosemide (LASIX) 40 MG tablet Take 40 mg by mouth daily.    Marland Kitchen gabapentin (NEURONTIN) 100 MG capsule Take 1 capsule (100 mg total) by mouth 3 (three) times daily. 90 capsule 3  . mometasone-formoterol (DULERA) 200-5 MCG/ACT AERO Inhale 2 puffs into the lungs 2 (two) times daily as needed for wheezing. 13 g 2  . rivaroxaban (XARELTO) 20 MG TABS tablet Take 1 tablet (20 mg total) by mouth daily with supper. 30 tablet 3  . Umeclidinium Bromide  (INCRUSE ELLIPTA) 62.5 MCG/INH AEPB Inhale 1 Inhaler into the lungs daily. 30 each 3  . amiodarone (PACERONE) 200 MG tablet Take 1 tablet (200 mg total) by mouth daily. (Patient not taking: Reported on 05/03/2015) 30 tablet 0  . atorvastatin (LIPITOR) 20 MG tablet TAKE 1 TABLET BY MOUTH DAILY (Patient not taking: Reported on 05/03/2015) 30 tablet 0  . lisinopril (PRINIVIL,ZESTRIL) 5 MG tablet Take 2 tablets (10 mg total) by mouth daily. (Patient not taking: Reported on 05/03/2015) 30 tablet 0  . spironolactone (ALDACTONE) 25 MG tablet Take 0.5 tablets (12.5 mg total) by mouth daily. (Patient not taking: Reported on 04/19/2015) 15 tablet 0  . traMADol (ULTRAM) 50 MG tablet Take 1 tablet (50 mg total) by mouth every 6 (six) hours as needed. (Patient not taking: Reported on 05/03/2015) 60 tablet 0    Objective: BP 132/75 mmHg  Pulse 70  Temp(Src) 97.9 F (36.6 C) (Oral)  Resp 17  Ht 6' (1.829 m)  Wt 195 lb 9.6 oz (88.724 kg)  BMI 26.52 kg/m2  SpO2 97% Exam: General: NAD Eyes: EOMI ENTM: MMM, mouth clear of lesions Neck: full ROM of neck Cardiovascular: RRR no murmur Respiratory: CTAB NWOB Abdomen: soft, NTTP, no masses or organomegaly MSK: atraumatic extremities, no edema bilateral lower extremities Skin: no rashes Neuro: grossly nonfocal speech normal, CN II-XII intact, normal strength and sensation bilaterally in upper and lower extremities Psych: normal range of affect, well groomed, speech normal in rate and volume, normal eye contact   Labs and Imaging: CBC BMET   Recent Labs Lab 05/03/15 1127  WBC 7.6  HGB 13.9  HCT 40.2  PLT 184    Recent Labs Lab 05/03/15 1127  NA 141  K 4.0  CL 110  CO2 25  BUN 20  CREATININE 0.98  GLUCOSE 126*  CALCIUM 9.3     Cardiac Panel (last 3 results)  Recent Labs  05/03/15 1434 05/03/15 2005 05/04/15 0229  TROPONINI <0.03 <0.03 <0.03    Frazier Richards, MD 05/04/2015, 9:41 AM PGY-1, [place] [facility]  pager: [phone], text pages welcome    FPTS Upper-Level Resident Addendum  I have independently interviewed and examined the patient. I have discussed the above with the original author and agree with their documentation. My edits for correction/addition/clarification are in pink. Please see also any attending notes.   Frazier Richards, MD MPH PGY-3, [facility] pager: [phone] (text pages  welcome through [facility])

## 2015-05-03 NOTE — ED Notes (Signed)
Attempted report 

## 2015-05-03 NOTE — ED Notes (Signed)
Dr. Lovena Le- cards sts call back with results of second troponin. If it is negative- hospitalist admit

## 2015-05-03 NOTE — ED Notes (Signed)
Admitting at bedside 

## 2015-05-03 NOTE — ED Notes (Signed)
Cardiology at bedside.

## 2015-05-03 NOTE — Consult Note (Signed)
CARDIOLOGY CONSULT NOTE   Patient ID: Paul Clayton MRN: [id number] DOB/AGE: 1946-02-02 69 y.o.  Admit date: 05/03/2015  Primary Physician   [name], MD Primary Cardiologist   Dr. Loralie Champagne Reason for Consultation  Chest pain  HPI: Paul Clayton is a 69 y.o. male with a history of nonischemic cardiomyopathy, PAF s/p cardioversion on 01/30/15, HTN, Hepatitis C who presented with chest pain.   He had cardiac catheterization in January 2016 which showed nonobstructive coronary disease. He has a past history of cocaine abuse and has a dilated cardiomyopathy.  He has been wearing a LifeVest. The patient has a history of homelessness. He currently is living in a motel room.   He was recently admitted 04/19/15- 04/20/15 for chest pain and syncope, likely related to a cocaine and marijuana use. He was not wearing his LifeVest at the time. Echocardiogram from Nov 2015 revealed EF of 15%, however most recent echocardiogram performed 04/20/15 showed an improved EF of 30-35%; grade 1 DD, mild mitral regur.  He sates he has not used any illegal drug since discharge.   He states he had gotten out of the shower around 8 AM this morning and had sudden onset sharp chest pains both sides of his chest with onset of nausea, upset stomach, diaphoresis, shortness of breath. Sensation he needed to have a bowel movement. He states pain lasted approximately a minute. He called EMS, was given aspirin, nitroglycerin and pain resolved. Currently he is complaining of right sided dull pain.   In ED, his Lytes normal, UDS is negative, poc trop is negative. Normal CXR. EKG showed NSR at rate of 80 without acute abnormality. Tele showed sinus bradycardia at rate of 50s.  He has a past history of atrial fibrillation and required cardioversion on 01/30/15. He is on amiodarone 200 mg daily.    Past Medical History  Diagnosis Date  . A-fib   . Hypertension   . CHF (congestive heart failure)     Nonischemic    . COPD (chronic obstructive pulmonary disease)   . Tobacco abuse   . Adenocarcinoma of colon     STAGE 1  . H/O ETOH abuse   . Heart murmur     "when I was youing"  . Pneumonia 2012-2014?    "I think I've had it twice" (04/19/2015)  . GERD (gastroesophageal reflux disease)   . Hepatitis C   . Migraine     "last one was back in the 1980's" (04/19/2015)  . Arthritis     "left hip" (04/19/2015)  . Chronic lower back pain   . Anxiety   . Depression   . Urinary urgency      Past Surgical History  Procedure Laterality Date  . Cardioversion      "I've had 2 in all" (04/19/2015)  . Colonoscopy    . Left and right heart catheterization with coronary angiogram N/A 10/23/2014    Procedure: LEFT AND RIGHT HEART CATHETERIZATION WITH CORONARY ANGIOGRAM;  Surgeon: Larey Dresser, MD;  Location: [facility] CATH LAB;  Service: Cardiovascular;  Laterality: N/A;  . Colonoscopy with propofol N/A 11/30/2014    Procedure: COLONOSCOPY WITH PROPOFOL;  Surgeon: Milus Banister, MD;  Location: [facility] ENDOSCOPY;  Service: Endoscopy;  Laterality: N/A;  . Cardioversion N/A 01/30/2015    Procedure: CARDIOVERSION;  Surgeon: Larey Dresser, MD;  Location: [facility] ENDOSCOPY;  Service: Cardiovascular;  Laterality: N/A;  . Cardiac catheterization  10/23/2014    No Known Allergies  I have  reviewed the patient's current medications     nitroGLYCERIN  Prior to Admission medications   Medication Sig Start Date End Date Taking? Authorizing Provider  albuterol-ipratropium (COMBIVENT) 18-103 MCG/ACT inhaler Inhale 1 puff into the lungs every 4 (four) hours as needed for wheezing or shortness of breath. 09/12/14  Yes Olam Idler, MD  baclofen (LIORESAL) 10 MG tablet Take 1 tablet (10 mg total) by mouth 3 (three) times daily. 04/30/15  Yes Olam Idler, MD  carvedilol (COREG) 6.25 MG tablet Take 1 tablet by mouth 2 (two) times daily. 03/27/15  Yes Historical Provider, MD  furosemide (LASIX) 40 MG tablet Take 40 mg by mouth daily.  12/01/14  Yes Historical Provider, MD  gabapentin (NEURONTIN) 100 MG capsule Take 1 capsule (100 mg total) by mouth 3 (three) times daily. 03/12/15  Yes Timmothy Euler, MD  losartan (COZAAR) 25 MG tablet Take 25 mg by mouth daily.   Yes Historical Provider, MD  mometasone-formoterol (DULERA) 200-5 MCG/ACT AERO Inhale 2 puffs into the lungs 2 (two) times daily as needed for wheezing. 01/06/15  Yes Leone Brand, MD  rivaroxaban (XARELTO) 20 MG TABS tablet Take 1 tablet (20 mg total) by mouth daily with supper. 11/21/14  Yes Larey Dresser, MD  Umeclidinium Bromide (INCRUSE ELLIPTA) 62.5 MCG/INH AEPB Inhale 1 Inhaler into the lungs daily. 02/20/15  Yes Olam Idler, MD  amiodarone (PACERONE) 200 MG tablet Take 1 tablet (200 mg total) by mouth daily. Patient not taking: Reported on 05/03/2015 02/23/15   Larey Dresser, MD  atorvastatin (LIPITOR) 20 MG tablet TAKE 1 TABLET BY MOUTH DAILY Patient not taking: Reported on 05/03/2015 02/21/15   Larey Dresser, MD  lisinopril (PRINIVIL,ZESTRIL) 5 MG tablet Take 2 tablets (10 mg total) by mouth daily. Patient not taking: Reported on 05/03/2015 02/23/15   Larey Dresser, MD  spironolactone (ALDACTONE) 25 MG tablet Take 0.5 tablets (12.5 mg total) by mouth daily. Patient not taking: Reported on 04/19/2015 11/28/14   Baron Sane, PA-C  traMADol (ULTRAM) 50 MG tablet Take 1 tablet (50 mg total) by mouth every 6 (six) hours as needed. Patient not taking: Reported on 05/03/2015 03/30/15   Olam Idler, MD     History   Social History  . Marital Status: Widowed    Spouse Name: N/A  . Number of Children: 4  . Years of Education: N/A   Occupational History  . Not on file.   Social History Main Topics  . Smoking status: Light Tobacco Smoker -- 0.10 packs/day for 52 years    Types: Cigarettes    Last Attempt to Quit: 09/01/2014  . Smokeless tobacco: Never Used     Comment: 04/19/2015 "probably smoke 1 pack cigarettes/month"  . Alcohol Use: 0.0 oz/week      0 Standard drinks or equivalent per week     Comment: 04/19/2015 "I might drink a 40oz beer/month"  . Drug Use: Yes    Special: "Crack" cocaine, Marijuana     Comment: 04/19/2015 "I was smoking weed; someone laced it w/crack 04/18/2015; I smoke weed a couple times/yr"  . Sexual Activity: Not Currently    Birth Control/ Protection: Abstinence   Other Topics Concern  . Not on file   Social History Narrative   Currently living in a shelter.    Family Status  Relation Status Death Age  . Mother Deceased   . Father Deceased    Family History  Problem Relation Age of Onset  .  Hypertension Mother      ROS:  Full 14 point review of systems complete and found to be negative unless listed above.  Physical Exam: Blood pressure 109/48, pulse 54, temperature 98 F (36.7 C), temperature source Oral, resp. rate 17, height 6' (1.829 m), weight 192 lb (87.091 kg), SpO2 99 %.  General: Well developed, well nourished, male in no acute distress Head: Eyes PERRLA, No xanthomas. Normocephalic and atraumatic, oropharynx without edema or exudate.  Lungs: Resp regular and unlabored, CTA. Heart: RRR no s3, s4, or murmurs..   Neck: No carotid bruits. No lymphadenopathy.  JVD. Abdomen: Bowel sounds present, abdomen soft and non-tender without masses or hernias noted. Msk:  No spine or cva tenderness. No weakness, no joint deformities or effusions. Extremities: No clubbing, cyanosis or edema. DP/PT/Radials 2+ and equal bilaterally. Neuro: Alert and oriented X 3. No focal deficits noted. Psych:  Good affect, responds appropriately Skin: No rashes or lesions noted.  Labs:   Lab Results  Component Value Date   WBC 7.6 05/03/2015   HGB 13.9 05/03/2015   HCT 40.2 05/03/2015   MCV 98.8 05/03/2015   PLT 184 05/03/2015   No results for input(s): INR in the last 72 hours.  Recent Labs Lab 05/03/15 1127  NA 141  K 4.0  CL 110  CO2 25  BUN 20  CREATININE 0.98  CALCIUM 9.3  GLUCOSE 126*    MAGNESIUM  Date Value Ref Range Status  03/05/2015 2.1 1.5 - 2.5 mg/dL Final   Echo: 04/20/15 LV EF: 30% -  35%  ------------------------------------------------------------------- Indications:   Chest pain 786.51.  ------------------------------------------------------------------- History:  PMH:  Syncope and dyspnea. Atrial fibrillation. Chronic obstructive pulmonary disease. Risk factors: Colon Cancer.  ------------------------------------------------------------------- Study Conclusions  - Left ventricle: The cavity size was mildly dilated. Wall thickness was normal. Systolic function was moderately to severely reduced. The estimated ejection fraction was in the range of 30% to 35%. Diffuse hypokinesis. Doppler parameters are consistent with abnormal left ventricular relaxation (grade 1 diastolic dysfunction). - Aortic valve: There was no stenosis. - Mitral valve: There was mild regurgitation. - Left atrium: The atrium was mildly dilated. - Right ventricle: The cavity size was normal. Systolic function was mildly reduced. - Pulmonary arteries: No complete TR doppler jet so unable to estimate PA systolic pressure. - Inferior vena cava: The vessel was normal in size. The respirophasic diameter changes were in the normal range (>= 50%), consistent with normal central venous pressure.  Impressions:  - Mildly dilated LV with diffuse hypokinesis, EF 30-35%. Normal RV size with mildly decreased systolic function. Mild MR.  ECG:  Vent. rate 80 BPM PR interval 179 ms QRS duration 92 ms QT/QTc 423/488 ms P-R-T axes 46 43 100  Radiology:  Dg Chest 2 View  05/03/2015   CLINICAL DATA:  Chest pain and shortness of breath.  EXAM: CHEST  2 VIEW  COMPARISON:  04/20/2015 and 04/19/2015  FINDINGS: The heart size and mediastinal contours are within normal limits. Both lungs are clear. The visualized skeletal structures are unremarkable.  IMPRESSION:  Normal exam.   Electronically Signed   By: Lorriane Shire M.D.   On: 05/03/2015 13:15    ASSESSMENT AND PLAN:     1. Atypical chest pain - Likely GI in etiology as pain started after eating. His Lytes normal, UDS is negative, poc trop is negative. Normal CXR. EKG showed NSR at rate of 80 without acute abnormality. Tele showed sinus bradycardia at rate of 50s. - LifeVest  interrogation in May was normal.  - Will get stat D-dimer and cycle trop now. If negative will follow in consult.   - GI cocktail for possible GERD - Continue home regimen  2. PAF - Currently in NSR. CHADSVASc score of at least 3 (age, CHF and HTN). Continue amio 200mg  daily and xarelto 20mg  daily.   3.  Ischemic DCM - EF improved to 30-35% - continues to wear life vest.  Continue HF meds including BB/ACE I/ARB/aldactone and Lasix.  He does not appear volume overloaded.    4.  Nonobstructive ASCAD by cath 10/2014 - continue statin.  Not on ASA due to NOAC  Signed: Bhagat,Bhavinkumar, PA 05/03/2015, 1:46 PM Pager [phone]  Co-Sign MD   Patient seen and examined with Leanor Kail, [place]. We discussed all aspects of the encounter. I agree with the assessment and plan as stated above. Patient recently cathed in Jan 2016 with similar symptoms of chest pain at which time cath showed nonobstructive disease.  His CP this am started after eating some meat.  It was sharp and stabbing and hurt to take in a deep breath.  It then moved to the right side of his chest.  He was given a GI cocktail which helped a little.  EKG is unchanged.  Troponin x 1 is negative.  I suspect this is due to GI etiology.  He has a history of nonischemic DCM and appears euvolemic on exam.  Will continue current heart failure meds.  Cycle enzymes and if negative will have medicine admit.  Check D-Dimer to rule out PE.    Signed: Fransico Him, MD [facility] 05/03/2015

## 2015-05-03 NOTE — Progress Notes (Signed)
New pt admission from ED. Pt brought to the floor in stable condition. Vitals taken. Initial Assessment done. All immediate pertinent needs to patient addressed. Patient Guide given to patient. Important safety instructions relating to hospitalization reviewed with patient. Patient verbalized understanding. Will continue to monitor pt.  Paged PA Baghat regarding pt removing lifevest during hospitalization.  No new orders received. Will continue to monitor pt.       Maurene Capes RN

## 2015-05-03 NOTE — ED Notes (Signed)
Cards paged

## 2015-05-03 NOTE — ED Provider Notes (Signed)
CSN: [id number]     Arrival date & time 05/03/15  1119 History   First MD Initiated Contact with Patient 05/03/15 1138     Chief Complaint  Patient presents with  . Chest Pain     (Consider location/radiation/quality/duration/timing/severity/associated sxs/prior Treatment) Patient is a 69 y.o. male presenting with chest pain. The history is provided by the patient. No language interpreter was used.  Chest Pain Pain location:  Substernal area Pain quality: sharp   Pain radiates to:  Precordial region Pain radiates to the back: no   Pain severity:  Severe Onset quality:  Sudden Duration:  1 minute Progression:  Waxing and waning Chronicity:  Recurrent Context: at rest   Relieved by:  Nitroglycerin Worsened by:  Nothing tried Ineffective treatments:  None tried Associated symptoms: diaphoresis, nausea and shortness of breath   Associated symptoms: no abdominal pain, no anxiety, no back pain, no claudication, no cough, no dizziness, no dysphagia, no fatigue, no fever, no headache, no lower extremity edema, no near-syncope, no numbness, not vomiting and no weakness   Risk factors: hypertension, male sex and smoking   Risk factors: no coronary artery disease and not obese     Past Medical History  Diagnosis Date  . A-fib   . Hypertension   . CHF (congestive heart failure)     Nonischemic  . COPD (chronic obstructive pulmonary disease)   . Tobacco abuse   . Adenocarcinoma of colon     STAGE 1  . H/O ETOH abuse   . Heart murmur     "when I was youing"  . Pneumonia 2012-2014?    "I think I've had it twice" (04/19/2015)  . GERD (gastroesophageal reflux disease)   . Hepatitis C   . Migraine     "last one was back in the 1980's" (04/19/2015)  . Arthritis     "left hip" (04/19/2015)  . Chronic lower back pain   . Anxiety   . Depression   . Urinary urgency    Past Surgical History  Procedure Laterality Date  . Cardioversion      "I've had 2 in all" (04/19/2015)  .  Colonoscopy    . Left and right heart catheterization with coronary angiogram N/A 10/23/2014    Procedure: LEFT AND RIGHT HEART CATHETERIZATION WITH CORONARY ANGIOGRAM;  Surgeon: Larey Dresser, MD;  Location: [facility] CATH LAB;  Service: Cardiovascular;  Laterality: N/A;  . Colonoscopy with propofol N/A 11/30/2014    Procedure: COLONOSCOPY WITH PROPOFOL;  Surgeon: Milus Banister, MD;  Location: [facility] ENDOSCOPY;  Service: Endoscopy;  Laterality: N/A;  . Cardioversion N/A 01/30/2015    Procedure: CARDIOVERSION;  Surgeon: Larey Dresser, MD;  Location: [facility] ENDOSCOPY;  Service: Cardiovascular;  Laterality: N/A;  . Cardiac catheterization  10/23/2014   Family History  Problem Relation Age of Onset  . Hypertension Mother    History  Substance Use Topics  . Smoking status: Light Tobacco Smoker -- 0.10 packs/day for 52 years    Types: Cigarettes    Last Attempt to Quit: 09/01/2014  . Smokeless tobacco: Never Used     Comment: 04/19/2015 "probably smoke 1 pack cigarettes/month"  . Alcohol Use: 0.0 oz/week    0 Standard drinks or equivalent per week     Comment: 04/19/2015 "I might drink a 40oz beer/month"    Review of Systems  Constitutional: Positive for diaphoresis. Negative for fever, activity change, appetite change and fatigue.  HENT: Negative for congestion, facial swelling, rhinorrhea and trouble swallowing.  Eyes: Negative for photophobia and pain.  Respiratory: Positive for shortness of breath. Negative for cough and chest tightness.   Cardiovascular: Positive for chest pain. Negative for claudication, leg swelling and near-syncope.  Gastrointestinal: Positive for nausea. Negative for vomiting, abdominal pain, diarrhea and constipation.  Endocrine: Negative for polydipsia and polyuria.  Genitourinary: Negative for dysuria, urgency, decreased urine volume and difficulty urinating.  Musculoskeletal: Negative for back pain and gait problem.  Skin: Negative for color change, rash and wound.   Allergic/Immunologic: Negative for immunocompromised state.  Neurological: Negative for dizziness, facial asymmetry, speech difficulty, weakness, numbness and headaches.  Psychiatric/Behavioral: Negative for confusion, decreased concentration and agitation.      Allergies  Review of patient's allergies indicates no known allergies.  Home Medications   Prior to Admission medications   Medication Sig Start Date End Date Taking? Authorizing Provider  albuterol-ipratropium (COMBIVENT) 18-103 MCG/ACT inhaler Inhale 1 puff into the lungs every 4 (four) hours as needed for wheezing or shortness of breath. 09/12/14  Yes Olam Idler, MD  baclofen (LIORESAL) 10 MG tablet Take 1 tablet (10 mg total) by mouth 3 (three) times daily. 04/30/15  Yes Olam Idler, MD  carvedilol (COREG) 6.25 MG tablet Take 1 tablet by mouth 2 (two) times daily. 03/27/15  Yes Historical Provider, MD  furosemide (LASIX) 40 MG tablet Take 40 mg by mouth daily. 12/01/14  Yes Historical Provider, MD  gabapentin (NEURONTIN) 100 MG capsule Take 1 capsule (100 mg total) by mouth 3 (three) times daily. 03/12/15  Yes Timmothy Euler, MD  losartan (COZAAR) 25 MG tablet Take 25 mg by mouth daily.   Yes Historical Provider, MD  mometasone-formoterol (DULERA) 200-5 MCG/ACT AERO Inhale 2 puffs into the lungs 2 (two) times daily as needed for wheezing. 01/06/15  Yes Leone Brand, MD  rivaroxaban (XARELTO) 20 MG TABS tablet Take 1 tablet (20 mg total) by mouth daily with supper. 11/21/14  Yes Larey Dresser, MD  Umeclidinium Bromide (INCRUSE ELLIPTA) 62.5 MCG/INH AEPB Inhale 1 Inhaler into the lungs daily. 02/20/15  Yes Olam Idler, MD  amiodarone (PACERONE) 200 MG tablet Take 1 tablet (200 mg total) by mouth daily. Patient not taking: Reported on 05/03/2015 02/23/15   Larey Dresser, MD  atorvastatin (LIPITOR) 20 MG tablet TAKE 1 TABLET BY MOUTH DAILY Patient not taking: Reported on 05/03/2015 02/21/15   Larey Dresser, MD  pantoprazole  (PROTONIX) 40 MG tablet Take 1 tablet (40 mg total) by mouth daily. 05/04/15   Mercy Riding, MD  spironolactone (ALDACTONE) 25 MG tablet Take 0.5 tablets (12.5 mg total) by mouth daily. Patient not taking: Reported on 04/19/2015 11/28/14   Baron Sane, PA-C  traMADol (ULTRAM) 50 MG tablet Take 1 tablet (50 mg total) by mouth every 6 (six) hours as needed. Patient not taking: Reported on 05/03/2015 03/30/15   Olam Idler, MD   BP 122/64 mmHg  Pulse 71  Temp(Src) 98.2 F (36.8 C) (Oral)  Resp 20  Ht 6' (1.829 m)  Wt 195 lb 9.6 oz (88.724 kg)  BMI 26.52 kg/m2  SpO2 100% Physical Exam  Constitutional: He is oriented to person, place, and time. He appears well-developed and well-nourished. No distress.  HENT:  Head: Normocephalic and atraumatic.  Mouth/Throat: No oropharyngeal exudate.  Eyes: Pupils are equal, round, and reactive to light.  Neck: Normal range of motion. Neck supple.  Cardiovascular: Normal rate, regular rhythm and normal heart sounds.  Exam reveals no gallop and no friction  rub.   No murmur heard. Pulmonary/Chest: Effort normal and breath sounds normal. No respiratory distress. He has no wheezes. He has no rales.  Wearing his life vest  Abdominal: Soft. Bowel sounds are normal. He exhibits no distension and no mass. There is no tenderness. There is no rebound and no guarding.  Musculoskeletal: Normal range of motion. He exhibits no edema or tenderness.  Neurological: He is alert and oriented to person, place, and time.  Skin: Skin is warm and dry.  Psychiatric: He has a normal mood and affect.    ED Course  Procedures (including critical care time) Labs Review Labs Reviewed  BASIC METABOLIC PANEL - Abnormal; Notable for the following:    Glucose, Bld 126 (*)    All other components within normal limits  CBC - Abnormal; Notable for the following:    RBC 4.07 (*)    MCH 34.2 (*)    All other components within normal limits  URINE RAPID DRUG SCREEN, HOSP  PERFORMED  D-DIMER, QUANTITATIVE (NOT AT [facility])  TROPONIN I  TROPONIN I  TROPONIN I  Randolm Idol, ED    Imaging Review Dg Chest 2 View  05/03/2015   CLINICAL DATA:  Chest pain and shortness of breath.  EXAM: CHEST  2 VIEW  COMPARISON:  04/20/2015 and 04/19/2015  FINDINGS: The heart size and mediastinal contours are within normal limits. Both lungs are clear. The visualized skeletal structures are unremarkable.  IMPRESSION: Normal exam.   Electronically Signed   By: Lorriane Shire M.D.   On: 05/03/2015 13:15     EKG Interpretation   Date/Time:  Thursday May 03 2015 11:19:58 EDT Ventricular Rate:  80 PR Interval:  179 QRS Duration: 92 QT Interval:  423 QTC Calculation: 488 R Axis:   43 Text Interpretation:  Sinus rhythm Left atrial enlargement LVH with  secondary repolarization abnormality Anterior ST elevation, probably due  to LVH Borderline prolonged QT interval similar to EKG in nov/'15  Confirmed by [place]  MD, [place] [phone]) on 05/03/2015 11:37:53 AM     In chart review during admission on 04/20/2015 patient had an echocardiogram where he was found to have an EF of between 30 and 35%  Per cardiology note:Cath: cardiac catheterization on 10/23/2014 which showed a mean wedge pressure 21, CO 4.31, CI 2.1, 20-30% diffuse nonobstructive CAD, EF 20-25% with diffuse hypokinesis. MDM   Final diagnoses:  Chest pain, unspecified chest pain type    Pt is a 69 y.o. male with Pmhx as above who presents with chest pain.  He states he had gotten out of the shower around 8 AM this morning and had sudden onset sharp chest pains both sides of his chest with onset of nausea, upset stomach, diaphoresis, shortness of breath.  Sensation he needed to have a bowel movement.  He states pain lasted approximately a minute.  He called EMS, was given aspirin, nitroglycerin and pain resolved until upon my exam on patient reporting that his chest pain has returned and is sharp on both sides of his  chest.  He does not appear short of breath.  He is not nauseated.  He is not diaphoretic.  He is wearing his life vest.  Cardiopulmonary exam is benign.  His EKG shows LVH with repolarization changes which is similar to prior EKG in 2015.  Patient was recently admitted to the hospital, was discharged about 2 weeks ago after having a syncopal episode with chest pain.  He missed his post discharge.  PCP follow-up  yesterday because he states that his ride never showed up.  He is an appointment with cardiology on the 19th, Dalton McLean.  Gine recurrance of pain in ED, Family practice asked to admit for further w/u.     Ernestina Patches, MD 05/05/15 1025

## 2015-05-03 NOTE — ED Notes (Signed)
Dr. Docherty at bedside.

## 2015-05-03 NOTE — ED Notes (Signed)
Pt arrived by [facility] with c/o CP that is located under left breast and described as sharp. Pain started 15 mins after he took his daily medications. When CP started he c/o dizziness, nausea and SOB. EMS arrived and pt only c/o CP other symptoms have resolved. Pt has external defibrillator. EMS administered Nitro x1 and ASA 324mg . Pt pain decreased from 10 to 2/10.

## 2015-05-04 DIAGNOSIS — K219 Gastro-esophageal reflux disease without esophagitis: Secondary | ICD-10-CM | POA: Diagnosis not present

## 2015-05-04 DIAGNOSIS — R079 Chest pain, unspecified: Secondary | ICD-10-CM | POA: Diagnosis not present

## 2015-05-04 DIAGNOSIS — I480 Paroxysmal atrial fibrillation: Secondary | ICD-10-CM

## 2015-05-04 DIAGNOSIS — I509 Heart failure, unspecified: Secondary | ICD-10-CM | POA: Diagnosis not present

## 2015-05-04 LAB — TROPONIN I: Troponin I: <0.03 ng/mL (ref <0.031)

## 2015-05-04 MED ORDER — PANTOPRAZOLE SODIUM 40 MG PO TBEC: 40.0000 mg | DELAYED_RELEASE_TABLET | Freq: Every day | ORAL | Status: DC

## 2015-05-04 NOTE — Progress Notes (Signed)
NURSING PROGRESS NOTE  Paul Clayton [id number] Discharge Data: 05/04/2015 4:20 PM Attending Provider: Blane Ohara McDiarmid, MD [id number] Berkley Harvey, MD     [name] to be D/C'd Extended [facility] [place] per MD order.  Discussed with the patient the After Visit Summary and all questions fully answered. All IV's discontinued with no bleeding noted. All belongings returned to patient for patient to take home.   Last Vital Signs:  Blood pressure 122/64, pulse 71, temperature 98.2 F (36.8 C), temperature source Oral, resp. rate 20, height 6' (1.829 m), weight 88.724 kg (195 lb 9.6 oz), SpO2 100 %.  Discharge Medication List   Medication List    STOP taking these medications        lisinopril 5 MG tablet  Commonly known as:  PRINIVIL,ZESTRIL      TAKE these medications        albuterol-ipratropium 18-103 MCG/ACT inhaler  Commonly known as:  COMBIVENT  Inhale 1 puff into the lungs every 4 (four) hours as needed for wheezing or shortness of breath.     amiodarone 200 MG tablet  Commonly known as:  PACERONE  Take 1 tablet (200 mg total) by mouth daily.     atorvastatin 20 MG tablet  Commonly known as:  LIPITOR  TAKE 1 TABLET BY MOUTH DAILY     baclofen 10 MG tablet  Commonly known as:  LIORESAL  Take 1 tablet (10 mg total) by mouth 3 (three) times daily.     carvedilol 6.25 MG tablet  Commonly known as:  COREG  Take 1 tablet by mouth 2 (two) times daily.     furosemide 40 MG tablet  Commonly known as:  LASIX  Take 40 mg by mouth daily.     gabapentin 100 MG capsule  Commonly known as:  NEURONTIN  Take 1 capsule (100 mg total) by mouth 3 (three) times daily.     losartan 25 MG tablet  Commonly known as:  COZAAR  Take 25 mg by mouth daily.     mometasone-formoterol 200-5 MCG/ACT Aero  Commonly known as:  DULERA  Inhale 2 puffs into the lungs 2 (two) times daily as needed for wheezing.     pantoprazole 40 MG tablet  Commonly known as:  PROTONIX  Take 1 tablet (40 mg  total) by mouth daily.     rivaroxaban 20 MG Tabs tablet  Commonly known as:  XARELTO  Take 1 tablet (20 mg total) by mouth daily with supper.     spironolactone 25 MG tablet  Commonly known as:  ALDACTONE  Take 0.5 tablets (12.5 mg total) by mouth daily.     traMADol 50 MG tablet  Commonly known as:  ULTRAM  Take 1 tablet (50 mg total) by mouth every 6 (six) hours as needed.     Umeclidinium Bromide 62.5 MCG/INH Aepb  Commonly known as:  INCRUSE ELLIPTA  Inhale 1 Inhaler into the lungs daily.

## 2015-05-04 NOTE — Care Management Note (Signed)
Case Management Note  Patient Details  Name: Paul Clayton MRN: [id number] Date of Birth: 10/01/1946  Subjective/Objective:        Admitted with chest pain            Action/Plan: Talked to patient about DCP; patient is not homeless,patient stays in a Hotel Extended Stay since 03/21/2015 which is being paid by Medicare; patient has been looking for an apartment but is having difficulty due to he is a convicted felon. Patient has spent 42 yrs in prison. He has family in the area, very supportive daughter and he loves his 4 grandchildren. Patient has Medicaid and Medicare. He does not have any problem getting his medication. He is in a program in which he gets his medication for free. Patient also stated that he is still trying to adjust to living outside of the prison life. Lots of encouragement given.  Expected Discharge Date:    05/04/2015     Expected Discharge Plan:  Home/Self Care  Discharge planning Services  CM Consult  Status of Service:  Completed, signed off   Sherrilyn Rist [phone] 05/04/2015, 3:13 PM

## 2015-05-04 NOTE — Progress Notes (Signed)
PT Cancellation Note  Patient Details Name: SHAYMUS EVELETH MRN: [id number] DOB: [birth year]   Cancelled Treatment:    Reason Eval/Treat Not Completed: PT screened, no needs identified, will sign off   Denice Bors 05/04/2015, 3:53 PM

## 2015-05-04 NOTE — Progress Notes (Signed)
Patient Profile: 69 y.o. homeless male with a history of nonischemic cardiomyopathy (EF improved from 15% to 30-35%, has been noncompliant with LifeVest), PAF s/p cardioversion on 01/30/15 on amiodarone and Xarelto, HTN and Hepatitis C who presented with atypical chest pain.   Subjective: Currently CP free but has had occasional recurrence of sharp shooting pain overnight. No dyspnea.   Objective: Vital signs in last 24 hours: Temp:  [97.5 F (36.4 C)-98.3 F (36.8 C)] 97.9 F (36.6 C) (07/15 0535) Pulse Rate:  [50-87] 70 (07/15 0535) Resp:  [10-20] 17 (07/15 0535) BP: (88-142)/(48-94) 132/75 mmHg (07/15 0535) SpO2:  [92 %-100 %] 97 % (07/15 0535) Weight:  [192 lb (87.091 kg)-202 lb 1.6 oz (91.672 kg)] 195 lb 9.6 oz (88.724 kg) (07/15 0546) Last BM Date: 05/03/15  Intake/Output from previous day: 07/14 0701 - 07/15 0700 In: 240 [P.O.:240] Out: 1050 [Urine:1050] Intake/Output this shift:    Medications Current Facility-Administered Medications  Medication Dose Route Frequency Provider Last Rate Last Dose  . acetaminophen (TYLENOL) tablet 650 mg  650 mg Oral Q4H PRN Frazier Richards, MD      . amiodarone (PACERONE) tablet 200 mg  200 mg Oral Daily Frazier Richards, MD      . atorvastatin (LIPITOR) tablet 20 mg  20 mg Oral Daily Frazier Richards, MD   20 mg at 05/03/15 2211  . baclofen (LIORESAL) tablet 10 mg  10 mg Oral TID Frazier Richards, MD   10 mg at 05/03/15 2220  . carvedilol (COREG) tablet 6.25 mg  6.25 mg Oral BID Frazier Richards, MD   6.25 mg at 05/03/15 2227  . furosemide (LASIX) tablet 40 mg  40 mg Oral Daily Frazier Richards, MD   40 mg at 05/03/15 2210  . gabapentin (NEURONTIN) capsule 200 mg  200 mg Oral TID Frazier Richards, MD   200 mg at 05/03/15 2220  . gi cocktail (Maalox,Lidocaine,Donnatal)  30 mL Oral TID PRN Frazier Richards, MD      . ipratropium-albuterol (DUONEB) 0.5-2.5 (3) MG/3ML nebulizer solution 3 mL  3 mL Inhalation Q4H PRN Frazier Richards, MD      . losartan (COZAAR)  tablet 25 mg  25 mg Oral Daily Frazier Richards, MD      . mometasone-formoterol [facility]) 200-5 MCG/ACT inhaler 2 puff  2 puff Inhalation BID Frazier Richards, MD   2 puff at 05/03/15 2000  . morphine 2 MG/ML injection 2 mg  2 mg Intravenous Q2H PRN Frazier Richards, MD      . nitroGLYCERIN (NITROSTAT) SL tablet 0.4 mg  0.4 mg Sublingual Q5 min PRN Ernestina Patches, MD   0.4 mg at 05/03/15 1330  . ondansetron (ZOFRAN) injection 4 mg  4 mg Intravenous Q6H PRN Frazier Richards, MD      . pantoprazole (PROTONIX) EC tablet 40 mg  40 mg Oral Daily Frazier Richards, MD   40 mg at 05/03/15 2219  . polyethylene glycol (MIRALAX / GLYCOLAX) packet 17 g  17 g Oral BID Frazier Richards, MD   17 g at 05/03/15 2227  . rivaroxaban (XARELTO) tablet 20 mg  20 mg Oral Q supper Frazier Richards, MD   20 mg at 05/03/15 2216  . spironolactone (ALDACTONE) tablet 12.5 mg  12.5 mg Oral Daily Frazier Richards, MD   12.5 mg at 05/03/15 2218  . Umeclidinium Bromide AEPB 1 Inhaler  1 Inhaler Inhalation Daily Frazier Richards, MD  1 Inhaler at 05/03/15 1827    PE: General appearance: alert, cooperative and no distress Neck: no carotid bruit and no JVD Lungs: clear to auscultation bilaterally Heart: regular rate and rhythm, S1, S2 normal, no murmur, click, rub or gallop Extremities: no LEE Pulses: 2+ and symmetric Skin: warm and dry Neurologic: Grossly normal  Lab Results:   Recent Labs  05/03/15 1127  WBC 7.6  HGB 13.9  HCT 40.2  PLT 184   BMET  Recent Labs  05/03/15 1127  NA 141  K 4.0  CL 110  CO2 25  GLUCOSE 126*  BUN 20  CREATININE 0.98  CALCIUM 9.3   Cardiac Panel (last 3 results)  Recent Labs  05/03/15 1434 05/03/15 2005 05/04/15 0229  TROPONINI <0.03 <0.03 <0.03     Assessment/Plan  Principal Problem:   Chest pain Active Problems:   HFrEF (heart failure with reduced ejection fraction)   COPD (chronic obstructive pulmonary disease)   Paroxysmal atrial fibrillation   Nonischemic cardiomyopathy    Urinary incontinence   1. Atypical Chest Pain: LHC 10/2014 showed no obstructive CAD. EKG unchanged. Cardiac enzymes are negative x 3. D-dimer also negative. Symptoms occurred yesterday after eating a meal and improved some with GI cocktail. Suspect GI etiology. Continue Protonix.  2. Nonischemic Cardiomyopathy: EF 30-35% on recent echo. Euvolemic on exam. No dyspnea. Continue medical therapy: Coreg, losartan, spironolactone and lasix. Low sodium diet. He reports improved compliance with LifeVest.   3. PAF:  S/p DCCV 01/30/15. Maintaining NSR on tele. HR controlled. Continue Amiodarone for rhythm control and Xarelto for A/C.    LOS: 1 day    Brittainy M. Ladoris Gene 05/04/2015 9:24 AM  Patient seen and exiamned  Agree with findings of B Simmons.  Pt has not been compliant  No symptoms to sugg angina.  Volume status is OK  Need to make sure he gets meds  Has appt with Kathleen Argue on Tues 7/19. Will be available for questions but sign off for now.    Dorris Carnes

## 2015-05-04 NOTE — Discharge Instructions (Signed)
It is nice taking care of you! You were admitted with chest pain which we think is likely from your reflux than your heart or lung after examining you and running multiple studies. Your pain has improved significantly. We started you on medication for reflux which you need to continue taking when you go home.  We also like to follow up with your primary care physician for follow up on this. Please, give them a call and make a follow up as soon as possible. The address and phone number are listed on the discharge paper under the section that say follow up.  Thing to watch:  -Severe chest pain -shortness of breath -fever, chills -bleeding from mouth or bottom -fainting or sudden onset weakness.  Gastroesophageal Reflux Disease, Adult Gastroesophageal reflux disease (GERD) happens when acid from your stomach flows up into the esophagus. When acid comes in contact with the esophagus, the acid causes soreness (inflammation) in the esophagus. Over time, GERD may create small holes (ulcers) in the lining of the esophagus. CAUSES   Increased body weight. This puts pressure on the stomach, making acid rise from the stomach into the esophagus.  Smoking. This increases acid production in the stomach.  Drinking alcohol. This causes decreased pressure in the lower esophageal sphincter (valve or ring of muscle between the esophagus and stomach), allowing acid from the stomach into the esophagus.  Late evening meals and a full stomach. This increases pressure and acid production in the stomach.  A malformed lower esophageal sphincter. Sometimes, no cause is found. SYMPTOMS   Burning pain in the lower part of the mid-chest behind the breastbone and in the mid-stomach area. This may occur twice a week or more often.  Trouble swallowing.  Sore throat.  Dry cough.  Asthma-like symptoms including chest tightness, shortness of breath, or wheezing. DIAGNOSIS  Your caregiver may be able to diagnose  GERD based on your symptoms. In some cases, X-rays and other tests may be done to check for complications or to check the condition of your stomach and esophagus. TREATMENT  Your caregiver may recommend over-the-counter or prescription medicines to help decrease acid production. Ask your caregiver before starting or adding any new medicines.  HOME CARE INSTRUCTIONS   Change the factors that you can control. Ask your caregiver for guidance concerning weight loss, quitting smoking, and alcohol consumption.  Avoid foods and drinks that make your symptoms worse, such as:  Caffeine or alcoholic drinks.  Chocolate.  Peppermint or mint flavorings.  Garlic and onions.  Spicy foods.  Citrus fruits, such as oranges, lemons, or limes.  Tomato-based foods such as sauce, chili, salsa, and pizza.  Fried and fatty foods.  Avoid lying down for the 3 hours prior to your bedtime or prior to taking a nap.  Eat small, frequent meals instead of large meals.  Wear loose-fitting clothing. Do not wear anything tight around your waist that causes pressure on your stomach.  Raise the head of your bed 6 to 8 inches with wood blocks to help you sleep. Extra pillows will not help.  Only take over-the-counter or prescription medicines for pain, discomfort, or fever as directed by your caregiver.  Do not take aspirin, ibuprofen, or other nonsteroidal anti-inflammatory drugs (NSAIDs). SEEK IMMEDIATE MEDICAL CARE IF:   You have pain in your arms, neck, jaw, teeth, or back.  Your pain increases or changes in intensity or duration.  You develop nausea, vomiting, or sweating (diaphoresis).  You develop shortness of breath, or you  faint.  Your vomit is green, yellow, black, or looks like coffee grounds or blood.  Your stool is red, bloody, or black. These symptoms could be signs of other problems, such as heart disease, gastric bleeding, or esophageal bleeding. MAKE SURE YOU:   Understand these  instructions.  Will watch your condition.  Will get help right away if you are not doing well or get worse. Document Released: 07/16/2005 Document Revised: 12/29/2011 Document Reviewed: 04/25/2011 [facility] Patient Information 2015 [place], [place]. This information is not intended to replace advice given to you by your health care provider. Make sure you discuss any questions you have with your health care provider.

## 2015-05-07 NOTE — Progress Notes (Signed)
Cardiology Office Note   Date:  05/08/2015   ID:  Paul Clayton, DOB 07-Dec-1945, MRN [id number]  PCP:  [name], MD  Cardiologist:  Dr. Loralie Champagne  ([facility])   Chief Complaint  Patient presents with  . Hospitalization Follow-up    ED visit for chest pain     History of Present Illness: Paul Clayton is a 69 y.o. male with a hx of COPD, ETOH and cocaine abuse, nonischemic cardiomyopathy, systolic HF, Persistent AF s/p cardioversion on 01/30/15, HTN, Hepatitis C, colon CA , homelessness.  Patient previously lived in [place].  Has a known Cardiomyopathy since 2012.  This has been presumed to be from cocaine and ETOH.  Admitted to [facility] with AF with RVR.  LHC in 1/16 demonstrated no obstructive CAD.  He had a mass found on colonoscopy in 11/15 and bx was + for adenocarcinoma.  He is s/p resection.  He had DCCV in 4/16 and developed bradycardia.  Coreg was stopped.  Last seen in HF Clinic in 5/16.  He remained in NSR.     Admitted 6/29-7/1 with chest pain and syncope in the setting of crack cocaine abuse.    He was not wearing his LifeVest at the time. Cardiac enzymes remained normal.  Echo demonstrated EF 30-35%.    Recently seen in the emergency room 05/03/15 by Dr. Radford Pax. He presented with complaints of chest pain that had worsened after eating and improved with a GI cocktail. Cardiac enzymes remained normal. D-dimer was negative. Symptoms were felt to likely be from a GI etiology.  He returns for follow-up.  He is here alone.  He is feeling better.  No further chest pain.  He has chronic DOE.  He is NYHA 2b-3.  Denies syncope. No alarms from LifeVest. He sleeps on 3 pillows chronically.  No PND, edema.  He has a NP cough.     Studies/Reports Reviewed Today:  Echo 04/20/15 - EF 30% to 35%. Diffuse hypokinesis. Grade 1diastolic dysfunction. - Aortic valve: There was no stenosis. - Mitral valve: There was mild regurgitation. - Left atrium: The atrium was mildly  dilated. - Right ventricle: The cavity size was normal. Systolic functionwas mildly reduced. - Pulmonary arteries: No complete TR doppler jet so unable toestimate PA systolic pressure. Impressions:  - Mildly dilated LV with diffuse hypokinesis, EF 30-35%. Normal RV size with mildly decreased systolic function. Mild MR.  LHC 10/2014 Left mainstem: 20% distal tapering.  LAD: 30% ostial LAD stenosis. Large high D1. Luminal irregularities.  LCx:  Small vessel with 30% ostial stenosis. Luminal irregularities.  RCA: 30% proximal RCA stenosis, luminal irregularities. Left ventriculography: EF 20-25%, diffuse hypokinesis. Final Conclusions: Nonischemic cardiomyopathy. Mildly elevated filling pressures. Low cardiac output but not markedly low. He can increase Lasix to 40 mg daily and will see him back in office in 2 wks.    Past Medical History  Diagnosis Date  . Persistent atrial fibrillation     a. s/p DCCV; b. Amiodarone  . Hypertension   . Chronic systolic CHF (congestive heart failure)     a. EF previously 15%; b. Echo 7/16:  EF 30-35%, diff HK, gr 1 DD, mild MR, mild LAE, mild reduced RVSF  . NICM (nonischemic cardiomyopathy)     no obs CAD on LHC in 1/16 - ? HTN or substance abuse  . CAD (coronary artery disease)     a. LHC 1/16:  LM 20%, oLAD 30%, oLCx 30%, pRCA 30%  . Adenocarcinoma  of colon     STAGE 1  . Substance abuse     ETOH; crack cocaine  . COPD (chronic obstructive pulmonary disease)   . Tobacco abuse   . GERD (gastroesophageal reflux disease)   . Hepatitis C   . Migraine     "last one was back in the 1980's" (04/19/2015)  . Arthritis     "left hip" (04/19/2015)  . Chronic lower back pain   . Anxiety   . Depression     Past Surgical History  Procedure Laterality Date  . Cardioversion      "I've had 2 in all" (04/19/2015)  . Colonoscopy    . Left and right heart catheterization with coronary angiogram N/A 10/23/2014    Procedure: LEFT AND RIGHT HEART  CATHETERIZATION WITH CORONARY ANGIOGRAM;  Surgeon: Larey Dresser, MD;  Location: [facility] CATH LAB;  Service: Cardiovascular;  Laterality: N/A;  . Colonoscopy with propofol N/A 11/30/2014    Procedure: COLONOSCOPY WITH PROPOFOL;  Surgeon: Milus Banister, MD;  Location: [facility] ENDOSCOPY;  Service: Endoscopy;  Laterality: N/A;  . Cardioversion N/A 01/30/2015    Procedure: CARDIOVERSION;  Surgeon: Larey Dresser, MD;  Location: [place];  Service: Cardiovascular;  Laterality: N/A;  . Cardiac catheterization  10/23/2014     Current Outpatient Prescriptions  Medication Sig Dispense Refill  . albuterol-ipratropium (COMBIVENT) 18-103 MCG/ACT inhaler Inhale 1 puff into the lungs every 4 (four) hours as needed for wheezing or shortness of breath. 1 Inhaler 1  . amiodarone (PACERONE) 200 MG tablet Take 1 tablet (200 mg total) by mouth daily. 30 tablet 0  . atorvastatin (LIPITOR) 20 MG tablet TAKE 1 TABLET BY MOUTH DAILY 30 tablet 0  . baclofen (LIORESAL) 10 MG tablet Take 1 tablet (10 mg total) by mouth 3 (three) times daily. 30 each 0  . carvedilol (COREG) 6.25 MG tablet Take 1 tablet by mouth 2 (two) times daily.    . furosemide (LASIX) 40 MG tablet Take 40 mg by mouth daily.    Marland Kitchen gabapentin (NEURONTIN) 100 MG capsule Take 1 capsule (100 mg total) by mouth 3 (three) times daily. 90 capsule 3  . losartan (COZAAR) 25 MG tablet Take 25 mg by mouth daily.    . mometasone-formoterol (DULERA) 200-5 MCG/ACT AERO Inhale 2 puffs into the lungs 2 (two) times daily as needed for wheezing. 13 g 2  . pantoprazole (PROTONIX) 40 MG tablet Take 1 tablet (40 mg total) by mouth daily. 30 tablet 1  . rivaroxaban (XARELTO) 20 MG TABS tablet Take 1 tablet (20 mg total) by mouth daily with supper. 30 tablet 3  . spironolactone (ALDACTONE) 25 MG tablet Take 0.5 tablets (12.5 mg total) by mouth daily. 15 tablet 0  . traMADol (ULTRAM) 50 MG tablet Take 1 tablet (50 mg total) by mouth every 6 (six) hours as needed. 60 tablet 0  .  Umeclidinium Bromide (INCRUSE ELLIPTA) 62.5 MCG/INH AEPB Inhale 1 Inhaler into the lungs daily. 30 each 3   No current facility-administered medications for this visit.    Allergies:   Review of patient's allergies indicates no known allergies.    Social History:  The patient  reports that he has been smoking Cigarettes.  He has a 5.2 pack-year smoking history. He has never used smokeless tobacco. He reports that he drinks alcohol. He reports that he uses illicit drugs ("Crack" cocaine and Marijuana).   Family History:  The patient's family history includes Hypertension in his mother.    ROS:  Please see the history of present illness.   Review of Systems  Cardiovascular: Positive for chest pain, irregular heartbeat and orthopnea.  Respiratory: Positive for cough, snoring and wheezing.   Musculoskeletal: Positive for back pain.  Gastrointestinal: Positive for constipation.  All other systems reviewed and are negative.    PHYSICAL EXAM: VS:  BP 106/74 mmHg  Pulse 59  Ht 6' (1.829 m)  Wt 201 lb (91.173 kg)  BMI 27.25 kg/m2    Wt Readings from Last 3 Encounters:  05/08/15 201 lb (91.173 kg)  05/04/15 195 lb 9.6 oz (88.724 kg)  04/20/15 197 lb 14.4 oz (89.767 kg)     GEN: Well nourished, well developed, in no acute distress HEENT: normal Neck: no JVD,   no masses Cardiac:  Normal S1/S2, RRR; no murmur ,  no rubs or gallops, no edema   Respiratory:  clear to auscultation bilaterally, no wheezing, rhonchi or rales. GI: soft, nontender, nondistended, + BS MS: no deformity or atrophy Skin: warm and dry  Neuro:  CNs II-XII intact, Strength and sensation are intact Psych: Normal affect   EKG:  EKG is ordered today.  It demonstrates:   Sinus bradycardia, HR 59, normal axis, QTC 491 ms, no change from prior tracings   Recent Labs: 10/05/2014: Pro B Natriuretic peptide (BNP) 4401.0* 01/26/2015: TSH 1.767 02/18/2015: B Natriuretic Peptide 97.5 03/05/2015: ALT 81*; Magnesium  2.1 05/03/2015: BUN 20; Creatinine, Ser 0.98; Hemoglobin 13.9; Platelets 184; Potassium 4.0; Sodium 141    Lipid Panel    Component Value Date/Time   CHOL 137 09/16/2014 1004   TRIG 103 09/16/2014 1004   HDL 43 09/16/2014 1004   CHOLHDL 3.2 09/16/2014 1004   VLDL 21 09/16/2014 1004   LDLCALC 73 09/16/2014 1004      ASSESSMENT AND PLAN:  Chronic systolic heart failure:  Volume appears stable. He is NYHA 2b-3. Continue current therapy.  Nonischemic cardiomyopathy:  Probably related to hypertension versus alcohol versus substance abuse. Continue beta blocker, ARB, spironolactone.  Recent Echo with EF 30-35%.  Will review with Dr. Loralie Champagne regarding +/- MRI to quantify EF better.  Continue LifeVest for now.   Coronary Artery Disease:  No angina. Recent admission to the hospital with atypical chest pain. This was likely related to acid reflux. He is having some trouble getting his PPI from his pharmacy. Cardiac catheterization with mild nonobstructive disease in 10/2014. He is not on aspirin as he is on Xarelto. Continue beta blocker, statin, ARB.    Paroxysmal atrial fibrillation:  Maintaining NSR. Continue Xarelto. Continue amiodarone. Obtain follow-up TSH and LFTs.  Chest pain, unspecified chest pain type:  As noted, this was related to acid reflux. Continue PPI.  Colon cancer:  Follow-up with oncology as planned.  Hx of substance abuse:  We discussed the importance of tobacco cessation as well as abstaining from cocaine and alcohol abuse.  Essential hypertension: Controlled.    Medication Changes: Current medicines are reviewed at length with the patient today.  Concerns regarding medicines are as outlined above.  The following changes have been made:   Discontinued Medications   No medications on file   Modified Medications   No medications on file   New Prescriptions   No medications on file     Labs/ tests ordered today include:   Orders Placed This Encounter   Procedures  . TSH  . Hepatic function panel  . EKG 12-Lead     Disposition:   FU with Dr. Loralie Champagne 3-4  weeks in HF Clinic.    Signed, Versie Starks, MHS 05/08/2015 2:56 PM    [facility] [place], [place], [place]  [address] Phone: [phone]; Fax: [phone]

## 2015-05-08 ENCOUNTER — Encounter: Payer: Self-pay | Admitting: Physician Assistant

## 2015-05-08 ENCOUNTER — Ambulatory Visit (INDEPENDENT_AMBULATORY_CARE_PROVIDER_SITE_OTHER): Payer: Medicare Other | Admitting: Physician Assistant

## 2015-05-08 VITALS — BP 106/74 | HR 59 | Ht 72.0 in | Wt 201.0 lb

## 2015-05-08 DIAGNOSIS — I480 Paroxysmal atrial fibrillation: Secondary | ICD-10-CM | POA: Diagnosis not present

## 2015-05-08 DIAGNOSIS — R079 Chest pain, unspecified: Secondary | ICD-10-CM | POA: Diagnosis not present

## 2015-05-08 DIAGNOSIS — I429 Cardiomyopathy, unspecified: Secondary | ICD-10-CM | POA: Diagnosis not present

## 2015-05-08 DIAGNOSIS — Z79899 Other long term (current) drug therapy: Secondary | ICD-10-CM | POA: Diagnosis not present

## 2015-05-08 DIAGNOSIS — Z87898 Personal history of other specified conditions: Secondary | ICD-10-CM

## 2015-05-08 DIAGNOSIS — F1911 Other psychoactive substance abuse, in remission: Secondary | ICD-10-CM

## 2015-05-08 DIAGNOSIS — I10 Essential (primary) hypertension: Secondary | ICD-10-CM

## 2015-05-08 DIAGNOSIS — C189 Malignant neoplasm of colon, unspecified: Secondary | ICD-10-CM

## 2015-05-08 DIAGNOSIS — I428 Other cardiomyopathies: Secondary | ICD-10-CM

## 2015-05-08 DIAGNOSIS — I5022 Chronic systolic (congestive) heart failure: Secondary | ICD-10-CM

## 2015-05-08 LAB — HEPATIC FUNCTION PANEL
ALT: 49 U/L (ref 0–53)
AST: 46 U/L — ABNORMAL HIGH (ref 0–37)
Albumin: 4.2 g/dL (ref 3.5–5.2)
Alkaline Phosphatase: 64 U/L (ref 39–117)
Bilirubin, Direct: 0.2 mg/dL (ref 0.0–0.3)
Total Bilirubin: 0.9 mg/dL (ref 0.2–1.2)
Total Protein: 7.9 g/dL (ref 6.0–8.3)

## 2015-05-08 LAB — TSH: TSH: 1.01 u[IU]/mL (ref 0.35–4.50)

## 2015-05-08 NOTE — Patient Instructions (Signed)
Medication Instructions:  Your physician recommends that you continue on your current medications as directed. Please refer to the Current Medication list given to you today.   Labwork: TODAY TSH, LFT  Testing/Procedures: NONE  Follow-Up: 3-4 WEEKS WITH DR. Aundra Dubin IN THE HEART FAILURE CLINIC PER SCOTT WEAVER, PAC  Any Other Special Instructions Will Be Listed Below (If Applicable).

## 2015-05-09 ENCOUNTER — Telehealth: Payer: Self-pay | Admitting: Physician Assistant

## 2015-05-09 NOTE — Telephone Encounter (Signed)
Reviewed case with Dr. Loralie Champagne.  EF on recent Echo is low enough that Paul Clayton should be seen by EP to discuss whether or not Paul Clayton should have an ICD placed. I told Paul Clayton that I would see if we should get an MRI.  We do not need to get an MRI.  Paul Clayton should remain on his LifeVest. Please refer to EP for discussion of +/- ICD. Richardson Dopp, PA-C   05/09/2015 9:18 AM

## 2015-05-10 ENCOUNTER — Telehealth: Payer: Self-pay | Admitting: *Deleted

## 2015-05-10 NOTE — Telephone Encounter (Signed)
S/w pt's daughter La-Keita that we are referring pt to EP due to low EF 30-35% to discuss about possible ICD or not. Advised daughter to let pt kow that he needs to continue wearing Life Vest at this time. Daughter states pt does not wear life vest all the time. She said she will get him to call and make an appt with EP. Daughter states she is out of town right now and so she cannot make appt for pt a time. She also feels frustrated that her dad is not being compliant with life vest and say "you can lead a horse to the water but you can't make them drink it". I will also send this message to Lorenda Hatchet EP scheduler about needing appt.

## 2015-05-10 NOTE — Discharge Summary (Signed)
[facility] Discharge Summary  Patient name: Paul Clayton Medical record number: [id number] Date of birth: 04-09-1946 Age: 69 y.o. Gender: male Date of Admission: 05/03/2015  Date of Discharge:05/04/2015 Admitting Physician: Blane Ohara McDiarmid, MD  Primary Care Provider: [name], MD Consultants: cardiology  Indication for Hospitalization: chest pain  Discharge Diagnoses/Problem List:  Patient Active Problem List   Diagnosis Date Noted  . Urinary incontinence 05/03/2015  . Sciatica of left side 03/12/2015  . Nonischemic cardiomyopathy 03/05/2015  . Pain in the chest 03/01/2015  . Elevated troponin   . Paroxysmal atrial fibrillation 02/25/2015  . HF (heart failure) 11/10/2014  . Near syncope 11/09/2014  . Hx of substance abuse 10/30/2014  . SOB (shortness of breath)   . Acute decompensated heart failure   . Systolic CHF, chronic   . Atrial fibrillation with rapid ventricular response   . Faintness   . Chronic systolic heart failure   . Persistent atrial fibrillation   . Chronic systolic CHF (congestive heart failure) 10/17/2014  . Homeless single person   . Atrial fibrillation with RVR 10/13/2014  . High risk social situation 09/30/2014  . Colon cancer 09/26/2014  . Dyspnea 09/22/2014  . Chest pain 09/16/2014  . Syncope   . COPD exacerbation   . HFrEF (heart failure with reduced ejection fraction) 09/12/2014  . A-fib 09/12/2014  . COPD (chronic obstructive pulmonary disease) 09/12/2014   Disposition: home  Discharge Condition: stable  Discharge Exam:  Gen: No acute distress. Alert, cooperative with exam HEENT: Oropharynx clear. MMM CV: RRR. 2/6 murmurs, rubs, or gallops noted. 2+ radial Resp: CTAB. No wheezing, crackles, or rhonchi noted. Abd: +BS. Soft, mildly tender. No rebound or guarding.  Ext: No edema. No gross deformities. Neuro: Alert and oriented, No gross focal deficits  Brief Hospital Course:  Paul Clayton is a 69  y.o. male with hx of nonischemic cardiomyopathy (EF 30-35%), paroxysmal afib, colon cancer, COPD who presented with chest pain .   Chest pain: improved significantly. most likely related to GERD. Postprandial, sharp, nonexertional, not relieved by rest. CMP and CBC WNL. CXR without intrathoracic process. EKG without acute changes, initial troponins negative. 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 Tele NSR although he has hx of Paroxysmal afib. Cardiology involved and recommended protonix and follow up in there office. (see there note). Recieved home meds: carvedilol, digoxin, lasix, amiodarone (200mg  daily), xarelto while in house.   Urinary incontinence: pt reports incontinence occasionally for 6-7 months. Not an acute issue. He had a normal range UOP while in house.  Other chronic conditions were stable.  Issues for Follow Up:  1. Cardiology: for atypical chest pain given his significant cardiac hx  Significant Procedures: none  Significant Labs and Imaging:   Recent Labs Lab 05/03/15 1127  WBC 7.6  HGB 13.9  HCT 40.2  PLT 184    Recent Labs Lab 05/03/15 1127 05/08/15 1457  NA 141  --   K 4.0  --   CL 110  --   CO2 25  --   GLUCOSE 126*  --   BUN 20  --   CREATININE 0.98  --   CALCIUM 9.3  --   ALKPHOS  --  64  AST  --  46*  ALT  --  49  ALBUMIN  --  4.2    Results/Tests Pending at Time of Discharge: none  Discharge Medications:    Medication  List    STOP taking these medications        lisinopril 5 MG tablet  Commonly known as:  PRINIVIL,ZESTRIL      TAKE these medications        albuterol-ipratropium 18-103 MCG/ACT inhaler  Commonly known as:  COMBIVENT  Inhale 1 puff into the lungs every 4 (four) hours as needed for wheezing or shortness of breath.     amiodarone 200 MG tablet  Commonly known as:  PACERONE  Take 1 tablet (200 mg total) by mouth daily.     atorvastatin 20 MG  tablet  Commonly known as:  LIPITOR  TAKE 1 TABLET BY MOUTH DAILY     baclofen 10 MG tablet  Commonly known as:  LIORESAL  Take 1 tablet (10 mg total) by mouth 3 (three) times daily.     carvedilol 6.25 MG tablet  Commonly known as:  COREG  Take 1 tablet by mouth 2 (two) times daily.     furosemide 40 MG tablet  Commonly known as:  LASIX  Take 40 mg by mouth daily.     gabapentin 100 MG capsule  Commonly known as:  NEURONTIN  Take 1 capsule (100 mg total) by mouth 3 (three) times daily.     losartan 25 MG tablet  Commonly known as:  COZAAR  Take 25 mg by mouth daily.     mometasone-formoterol 200-5 MCG/ACT Aero  Commonly known as:  DULERA  Inhale 2 puffs into the lungs 2 (two) times daily as needed for wheezing.     pantoprazole 40 MG tablet  Commonly known as:  PROTONIX  Take 1 tablet (40 mg total) by mouth daily.     rivaroxaban 20 MG Tabs tablet  Commonly known as:  XARELTO  Take 1 tablet (20 mg total) by mouth daily with supper.     spironolactone 25 MG tablet  Commonly known as:  ALDACTONE  Take 0.5 tablets (12.5 mg total) by mouth daily.     traMADol 50 MG tablet  Commonly known as:  ULTRAM  Take 1 tablet (50 mg total) by mouth every 6 (six) hours as needed.     Umeclidinium Bromide 62.5 MCG/INH Aepb  Commonly known as:  INCRUSE ELLIPTA  Inhale 1 Inhaler into the lungs daily.        Discharge Instructions: Please refer to Patient Instructions section of EMR for full details.  Patient was counseled important signs and symptoms that should prompt return to medical care, changes in medications, dietary instructions, activity restrictions, and follow up appointments.   Follow-Up Appointments:   Mercy Riding, MD 05/10/2015, 1:50 AM PGY-1, [place]

## 2015-05-10 NOTE — Telephone Encounter (Signed)
S/w pt's daughter La-Keita DPR on file about lab results and to continue on current dose of Amiodarone.

## 2015-05-15 ENCOUNTER — Other Ambulatory Visit: Payer: Self-pay | Admitting: Physician Assistant

## 2015-05-15 ENCOUNTER — Other Ambulatory Visit: Payer: Self-pay | Admitting: Cardiology

## 2015-05-21 ENCOUNTER — Ambulatory Visit: Payer: Medicare Other | Admitting: Family Medicine

## 2015-05-21 ENCOUNTER — Emergency Department (HOSPITAL_COMMUNITY)
Admission: EM | Admit: 2015-05-21 | Discharge: 2015-05-21 | Disposition: A | Discharge status: Home / Self Care | Payer: Medicare Other | Attending: Emergency Medicine | Admitting: Emergency Medicine

## 2015-05-21 ENCOUNTER — Encounter (HOSPITAL_COMMUNITY): Payer: Self-pay | Admitting: Nurse Practitioner

## 2015-05-21 DIAGNOSIS — M5432 Sciatica, left side: Secondary | ICD-10-CM | POA: Diagnosis not present

## 2015-05-21 DIAGNOSIS — F329 Major depressive disorder, single episode, unspecified: Secondary | ICD-10-CM | POA: Diagnosis not present

## 2015-05-21 DIAGNOSIS — M1990 Unspecified osteoarthritis, unspecified site: Secondary | ICD-10-CM | POA: Insufficient documentation

## 2015-05-21 DIAGNOSIS — I2510 Atherosclerotic heart disease of native coronary artery without angina pectoris: Secondary | ICD-10-CM | POA: Insufficient documentation

## 2015-05-21 DIAGNOSIS — Z79899 Other long term (current) drug therapy: Secondary | ICD-10-CM | POA: Diagnosis not present

## 2015-05-21 DIAGNOSIS — G43909 Migraine, unspecified, not intractable, without status migrainosus: Secondary | ICD-10-CM | POA: Insufficient documentation

## 2015-05-21 DIAGNOSIS — J449 Chronic obstructive pulmonary disease, unspecified: Secondary | ICD-10-CM | POA: Insufficient documentation

## 2015-05-21 DIAGNOSIS — I10 Essential (primary) hypertension: Secondary | ICD-10-CM | POA: Diagnosis not present

## 2015-05-21 DIAGNOSIS — Z7901 Long term (current) use of anticoagulants: Secondary | ICD-10-CM | POA: Insufficient documentation

## 2015-05-21 DIAGNOSIS — K219 Gastro-esophageal reflux disease without esophagitis: Secondary | ICD-10-CM | POA: Insufficient documentation

## 2015-05-21 DIAGNOSIS — Z9889 Other specified postprocedural states: Secondary | ICD-10-CM | POA: Diagnosis not present

## 2015-05-21 DIAGNOSIS — G8929 Other chronic pain: Secondary | ICD-10-CM | POA: Diagnosis not present

## 2015-05-21 DIAGNOSIS — I5022 Chronic systolic (congestive) heart failure: Secondary | ICD-10-CM | POA: Insufficient documentation

## 2015-05-21 DIAGNOSIS — F419 Anxiety disorder, unspecified: Secondary | ICD-10-CM | POA: Diagnosis not present

## 2015-05-21 DIAGNOSIS — Z8619 Personal history of other infectious and parasitic diseases: Secondary | ICD-10-CM | POA: Insufficient documentation

## 2015-05-21 DIAGNOSIS — Z85038 Personal history of other malignant neoplasm of large intestine: Secondary | ICD-10-CM | POA: Insufficient documentation

## 2015-05-21 DIAGNOSIS — I481 Persistent atrial fibrillation: Secondary | ICD-10-CM | POA: Diagnosis not present

## 2015-05-21 DIAGNOSIS — Z720 Tobacco use: Secondary | ICD-10-CM | POA: Diagnosis not present

## 2015-05-21 DIAGNOSIS — R32 Unspecified urinary incontinence: Secondary | ICD-10-CM | POA: Insufficient documentation

## 2015-05-21 DIAGNOSIS — M549 Dorsalgia, unspecified: Secondary | ICD-10-CM | POA: Diagnosis present

## 2015-05-21 MED ORDER — OXYCODONE HCL 5 MG PO TABS: 2.5000 mg | ORAL_TABLET | ORAL | Status: DC | PRN

## 2015-05-21 MED ORDER — OXYCODONE-ACETAMINOPHEN 5-325 MG PO TABS
1.0000 | ORAL_TABLET | Freq: Once | ORAL | Status: AC
  Administered 2015-05-21: 1 via ORAL
  Filled 2015-05-21: qty 1

## 2015-05-21 MED ORDER — ONDANSETRON 4 MG PO TBDP
4.0000 mg | ORAL_TABLET | Freq: Once | ORAL | Status: AC
  Administered 2015-05-21: 4 mg via ORAL
  Filled 2015-05-21: qty 1

## 2015-05-21 NOTE — ED Notes (Signed)
He states hes been having back pain since February that is worse this week. Pain is in lower back radiating down R leg to foot. Took pain medication with no relief. Denies bowel/bladder changes. He had appt with PCP today but came here instead because he was in too much pain to walk to pcp clinic. Ambulatory with cane.

## 2015-05-21 NOTE — Discharge Instructions (Signed)
Sciatica Sciatica is pain, weakness, numbness, or tingling along the path of the sciatic nerve. The nerve starts in the lower back and runs down the back of each leg. The nerve controls the muscles in the lower leg and in the back of the knee, while also providing sensation to the back of the thigh, lower leg, and the sole of your foot. Sciatica is a symptom of another medical condition. For instance, nerve damage or certain conditions, such as a herniated disk or bone spur on the spine, pinch or put pressure on the sciatic nerve. This causes the pain, weakness, or other sensations normally associated with sciatica. Generally, sciatica only affects one side of the body. CAUSES   Herniated or slipped disc.  Degenerative disk disease.  A pain disorder involving the narrow muscle in the buttocks (piriformis syndrome).  Pelvic injury or fracture.  Pregnancy.  Tumor (rare). SYMPTOMS  Symptoms can vary from mild to very severe. The symptoms usually travel from the low back to the buttocks and down the back of the leg. Symptoms can include:  Mild tingling or dull aches in the lower back, leg, or hip.  Numbness in the back of the calf or sole of the foot.  Burning sensations in the lower back, leg, or hip.  Sharp pains in the lower back, leg, or hip.  Leg weakness.  Severe back pain inhibiting movement. These symptoms may get worse with coughing, sneezing, laughing, or prolonged sitting or standing. Also, being overweight may worsen symptoms. DIAGNOSIS  Your caregiver will perform a physical exam to look for common symptoms of sciatica. He or she may ask you to do certain movements or activities that would trigger sciatic nerve pain. Other tests may be performed to find the cause of the sciatica. These may include:  Blood tests.  X-rays.  Imaging tests, such as an MRI or CT scan. TREATMENT  Treatment is directed at the cause of the sciatic pain. Sometimes, treatment is not necessary  and the pain and discomfort goes away on its own. If treatment is needed, your caregiver may suggest:  Over-the-counter medicines to relieve pain.  Prescription medicines, such as anti-inflammatory medicine, muscle relaxants, or narcotics.  Applying heat or ice to the painful area.  Steroid injections to lessen pain, irritation, and inflammation around the nerve.  Reducing activity during periods of pain.  Exercising and stretching to strengthen your abdomen and improve flexibility of your spine. Your caregiver may suggest losing weight if the extra weight makes the back pain worse.  Physical therapy.  Surgery to eliminate what is pressing or pinching the nerve, such as a bone spur or part of a herniated disk. HOME CARE INSTRUCTIONS   Only take over-the-counter or prescription medicines for pain or discomfort as directed by your caregiver.  Apply ice to the affected area for 20 minutes, 3-4 times a day for the first 48-72 hours. Then try heat in the same way.  Exercise, stretch, or perform your usual activities if these do not aggravate your pain.  Attend physical therapy sessions as directed by your caregiver.  Keep all follow-up appointments as directed by your caregiver.  Do not wear high heels or shoes that do not provide proper support.  Check your mattress to see if it is too soft. A firm mattress may lessen your pain and discomfort. SEEK IMMEDIATE MEDICAL CARE IF:   You lose control of your bowel or bladder (incontinence).  You have increasing weakness in the lower back, pelvis, buttocks,   or legs.  You have redness or swelling of your back.  You have a burning sensation when you urinate.  You have pain that gets worse when you lie down or awakens you at night.  Your pain is worse than you have experienced in the past.  Your pain is lasting longer than 4 weeks.  You are suddenly losing weight without reason. MAKE SURE YOU:  Understand these  instructions.  Will watch your condition.  Will get help right away if you are not doing well or get worse. Document Released: 09/30/2001 Document Revised: 04/06/2012 Document Reviewed: 02/15/2012 [employer] Patient Information 2015 [employer]. This information is not intended to replace advice given to you by your health care provider. Make sure you discuss any questions you have with your health care provider.  

## 2015-05-21 NOTE — ED Provider Notes (Signed)
CSN: [id number]     Arrival date & time 05/21/15  1419 History  This chart was scribed for non-physician practitioner Margarita Mail, PA-C working with Orlie Dakin, MD by Zola Button, ED Scribe. This patient was seen in room TR06C/TR06C and the patient's care was started at 3:09 PM.       Chief Complaint  Patient presents with  . Back Pain   The history is provided by the patient. No language interpreter was used.   HPI Comments: Paul Clayton is a 69 y.o. male with a history of chronic systolic CHF who presents to the Emergency Department complaining of constant, aching left-sided back pain radiating down his left leg to his posterior calf that started 6 months ago but worsened this past week. The pain is slightly better when applying pressure to his back. He states he has had some worsening of baseline urinary incontinence. He is able to ambulate, but very slowly due to the pain. Patient denies loss of bowel control, headache, fever, and chills. He denies injury and history of kidney or liver disease. No prior back surgeries. He did have a colonoscopy within the past 3 months which revealed adenocarcinoma of colon; he was treated for this. Patient had an appointment with his PCP today at 2:30 PM, but came here instead because he states he had too much pain to walk to his PCP office.   Past Medical History  Diagnosis Date  . Persistent atrial fibrillation     a. s/p DCCV; b. Amiodarone  . Hypertension   . Chronic systolic CHF (congestive heart failure)     a. EF previously 15%; b. Echo 7/16:  EF 30-35%, diff HK, gr 1 DD, mild MR, mild LAE, mild reduced RVSF  . NICM (nonischemic cardiomyopathy)     no obs CAD on LHC in 1/16 - ? HTN or substance abuse  . CAD (coronary artery disease)     a. LHC 1/16:  LM 20%, oLAD 30%, oLCx 30%, pRCA 30%  . Adenocarcinoma of colon     STAGE 1  . Substance abuse     ETOH; crack cocaine  . COPD (chronic obstructive pulmonary disease)   . Tobacco abuse    . GERD (gastroesophageal reflux disease)   . Hepatitis C   . Migraine     "last one was back in the 1980's" (04/19/2015)  . Arthritis     "left hip" (04/19/2015)  . Chronic lower back pain   . Anxiety   . Depression    Past Surgical History  Procedure Laterality Date  . Cardioversion      "I've had 2 in all" (04/19/2015)  . Colonoscopy    . Left and right heart catheterization with coronary angiogram N/A 10/23/2014    Procedure: LEFT AND RIGHT HEART CATHETERIZATION WITH CORONARY ANGIOGRAM;  Surgeon: Larey Dresser, MD;  Location: [facility] CATH LAB;  Service: Cardiovascular;  Laterality: N/A;  . Colonoscopy with propofol N/A 11/30/2014    Procedure: COLONOSCOPY WITH PROPOFOL;  Surgeon: Milus Banister, MD;  Location: [facility] ENDOSCOPY;  Service: Endoscopy;  Laterality: N/A;  . Cardioversion N/A 01/30/2015    Procedure: CARDIOVERSION;  Surgeon: Larey Dresser, MD;  Location: [facility] ENDOSCOPY;  Service: Cardiovascular;  Laterality: N/A;  . Cardiac catheterization  10/23/2014   Family History  Problem Relation Age of Onset  . Hypertension Mother    History  Substance Use Topics  . Smoking status: Light Tobacco Smoker -- 0.10 packs/day for 52 years  Types: Cigarettes    Last Attempt to Quit: 09/01/2014  . Smokeless tobacco: Never Used     Comment: 04/19/2015 "probably smoke 1 pack cigarettes/month"  . Alcohol Use: 0.0 oz/week    0 Standard drinks or equivalent per week     Comment: 04/19/2015 "I might drink a 40oz beer/month"    Review of Systems  Constitutional: Negative for fever and chills.  Genitourinary: Positive for enuresis.  Musculoskeletal: Positive for back pain.  Neurological: Negative for headaches.      Allergies  Review of patient's allergies indicates no known allergies.  Home Medications   Prior to Admission medications   Medication Sig Start Date End Date Taking? Authorizing Provider  albuterol-ipratropium (COMBIVENT) 18-103 MCG/ACT inhaler Inhale 1 puff into the lungs  every 4 (four) hours as needed for wheezing or shortness of breath. 09/12/14   Olam Idler, MD  amiodarone (PACERONE) 200 MG tablet Take 1 tablet (200 mg total) by mouth daily. 02/23/15   Larey Dresser, MD  atorvastatin (LIPITOR) 20 MG tablet TAKE 1 TABLET BY MOUTH DAILY 02/21/15   Larey Dresser, MD  baclofen (LIORESAL) 10 MG tablet Take 1 tablet (10 mg total) by mouth 3 (three) times daily. 04/30/15   Olam Idler, MD  carvedilol (COREG) 6.25 MG tablet Take 1 tablet by mouth 2 (two) times daily. 03/27/15   Historical Provider, MD  furosemide (LASIX) 40 MG tablet TAKE 1 TABLET BY MOUTH   DAILY 05/16/15   Larey Dresser, MD  gabapentin (NEURONTIN) 100 MG capsule Take 1 capsule (100 mg total) by mouth 3 (three) times daily. 03/12/15   Timmothy Euler, MD  losartan (COZAAR) 25 MG tablet Take 1 tablet (25 mg total) by mouth daily. Appointment needed for refills 05/15/15   Liliane Shi, PA-C  mometasone-formoterol (DULERA) 200-5 MCG/ACT AERO Inhale 2 puffs into the lungs 2 (two) times daily as needed for wheezing. 01/06/15   Leone Brand, MD  oxyCODONE (OXY IR/ROXICODONE) 5 MG immediate release tablet Take 0.5 tablets (2.5 mg total) by mouth every 4 (four) hours as needed for severe pain. 05/21/15   Margarita Mail, PA-C  pantoprazole (PROTONIX) 40 MG tablet Take 1 tablet (40 mg total) by mouth daily. 05/04/15   Mercy Riding, MD  spironolactone (ALDACTONE) 25 MG tablet Take 0.5 tablets (12.5 mg total) by mouth daily. 11/28/14   Jennifer Piepenbrink, PA-C  traMADol (ULTRAM) 50 MG tablet Take 1 tablet (50 mg total) by mouth every 6 (six) hours as needed. 03/30/15   Olam Idler, MD  Umeclidinium Bromide (INCRUSE ELLIPTA) 62.5 MCG/INH AEPB Inhale 1 Inhaler into the lungs daily. 02/20/15   Olam Idler, MD  XARELTO 20 MG TABS tablet TAKE ONE TABLET   BY MOUTH   DAILY 05/16/15   Larey Dresser, MD   BP 93/63 mmHg  Pulse 94  Temp(Src) 98.5 F (36.9 C) (Oral)  Resp 16  Ht 6' (1.829 m)  Wt 206 lb 1.6 oz  (93.486 kg)  BMI 27.95 kg/m2  SpO2 98% Physical Exam  Constitutional: He is oriented to person, place, and time. He appears well-developed and well-nourished. No distress.  HENT:  Head: Normocephalic and atraumatic.  Mouth/Throat: Oropharynx is clear and moist. No oropharyngeal exudate.  Eyes: Pupils are equal, round, and reactive to light.  Neck: Neck supple.  Cardiovascular: Normal rate.   Pulmonary/Chest: Effort normal.  Musculoskeletal: He exhibits no edema.   Patient appears to be in mild to moderate pain, antalgic  gait noted. Lumbosacral spine area reveals no local tenderness or mass. Painful and reduced LS ROM noted. Straight leg raise is positive on left. DTR's, motor strength and sensation normal, including heel and toe gait.    Neurological: He is alert and oriented to person, place, and time. No cranial nerve deficit.  Skin: Skin is warm and dry. No rash noted.  Psychiatric: He has a normal mood and affect. His behavior is normal.  Nursing note and vitals reviewed.   ED Course  Procedures  DIAGNOSTIC STUDIES: Oxygen Saturation is 98% on room air, normal by my interpretation.    COORDINATION OF CARE: 3:21 PM-Discussed treatment plan which includes pain medications with patient/guardian at bedside and patient/guardian agreed to plan.    Labs Review Labs Reviewed - No data to display  Imaging Review No results found.   EKG Interpretation None      MDM   Final diagnoses:  Sciatica, left    MDM Number of Diagnoses or Management Options Sciatica, left:  Patient with back pain.  No neurological deficits and normal neuro exam.  Patient can walk but states is painful.  No loss of bowel or bladder control.  No concern for cauda equina.  No fever, night sweats, weight loss, h/o cancer, IVDU.  RICE protocol and pain medicine indicated and discussed with patient.    I personally performed the services described in this documentation, which was scribed in my presence.  The recorded information has been reviewed and is accurate.      Margarita Mail, PA-C 05/21/15 1847  Orlie Dakin, MD 05/22/15 [phone]

## 2015-05-28 ENCOUNTER — Encounter: Payer: Self-pay | Admitting: Family Medicine

## 2015-05-28 ENCOUNTER — Ambulatory Visit (INDEPENDENT_AMBULATORY_CARE_PROVIDER_SITE_OTHER): Payer: Medicare Other | Admitting: Family Medicine

## 2015-05-28 VITALS — BP 126/75 | HR 87 | Temp 98.4°F | Ht 72.0 in | Wt 208.0 lb

## 2015-05-28 DIAGNOSIS — M5432 Sciatica, left side: Secondary | ICD-10-CM | POA: Diagnosis not present

## 2015-05-28 DIAGNOSIS — M545 Low back pain, unspecified: Secondary | ICD-10-CM

## 2015-05-28 DIAGNOSIS — M79605 Pain in left leg: Secondary | ICD-10-CM

## 2015-05-28 MED ORDER — BACLOFEN 10 MG PO TABS: 10.0000 mg | ORAL_TABLET | Freq: Three times a day (TID) | ORAL | Status: DC

## 2015-05-28 NOTE — Patient Instructions (Signed)
It was great seeing you today.   1. Your back pain is most likely muscle strain and arthritis. This is best treated with stretching and exercise. 2. I have ordered an MRI to further assess your back pain.   3. He can continue to take baclofen at night to help with muscle spasm.  This medication may cause dizziness, so no driving and be careful when walking.    Please bring all your medications to every doctors visit  Sign up for My Chart to have easy access to your labs results, and communication with your Primary care physician.  Next Appointment  Please make an appointment with Dr Berkley Harvey in 2-3 weeks for back pain follow-up    I look forward to talking with you again at our next visit. If you have any questions or concerns before then, please call the clinic at [phone].  Take Care,   Dr [name]

## 2015-05-29 ENCOUNTER — Encounter: Payer: Self-pay | Admitting: Family Medicine

## 2015-05-29 NOTE — Progress Notes (Signed)
  Patient name: ROC Clayton MRN [id number]  Date of birth: 1946-03-28  CC & HPI:  Paul Clayton is a 69 y.o. male presenting today for emergency room follow-up for back pain.   BACK PAIN He reports left lumbar back pain that radiates down to his left knee.  Pain is been ongoing for the past several months.  Denies any trauma or previous surgery; however, x-ray reveals old left femur fracture.  Denies any fevers or chills.  Has history of colon cancer and has been told this is in remission.  Pain is achy, throbbing, worse with physical activity.  Denies lower extremity weakness, numbness.  Denies any relief with gabapentin.  Some improvement with baclofen and opiates  Symptoms Incontinence of bowel or bladder:  no Numbness of leg: no Fever: no Rest or Night pain: no Weight Loss:  no Rash: no  ROS see HPI Smoking Status noted.  Medical & Surgical Hx:  Reviewed  Medications & Allergies: Reviewed  Social History: Reviewed:   Objective Findings:  Vitals: BP 126/75 mmHg  Pulse 87  Temp(Src) 98.4 F (36.9 C) (Oral)  Ht 6' (1.829 m)  Wt 208 lb (94.348 kg)  BMI 28.20 kg/m2  Gen: NAD CV: RRR w/o m/r/g, pulses +2 b/l Resp: CTAB w/ normal respiratory effort Back Exam:  Inspection: Unremarkable  Palpable tenderness: Left lumbar paraspinal  Range of Motion: Full  Leg strength: Quad: 5/5 Hamstring: 5/5 Hip flexor: 5/5 Hip abductors: 5/5  Strength at foot: Plantar-flexion: 5/5 Dorsi-flexion: 5/5 Eversion: 5/5 Inversion: 5/5  Sensory change: Gross sensation intact to all lumbar and sacral dermatomes.  Reflexes: 2+ at both patellar tendons, 2+ at achilles tendons, Babinski's downgoing.  Gait unremarkable. SLR laying: Negative  XSLR laying: Negative  FABER: negative.  Assessment & Plan:   Please See Problem Focused Assessment & Plan

## 2015-05-29 NOTE — Assessment & Plan Note (Signed)
Persistent left paraspinal lumbar back pain.  Pain has been ongoing for several months now.  Still suspect muscle skeletal etiology, however, will obtain MRI.  Given chronicity of pain and history of colon cancer.  - Refilled baclofen - Again educated patient on exercises and stretching - Provided phone number for physical therapy and advised patient to call; previous referral still active

## 2015-05-30 ENCOUNTER — Encounter: Payer: Self-pay | Admitting: Physician Assistant

## 2015-06-01 ENCOUNTER — Encounter (HOSPITAL_COMMUNITY): Payer: Self-pay

## 2015-06-01 NOTE — Progress Notes (Signed)
Medical record request received from [facility] [employer] controller. All notes, procedures, diagnostic results, medications, labs, and vitals sent via mail to given address as requested. Copy of request scanned into electronic medical records.  Renee Pain

## 2015-06-13 ENCOUNTER — Other Ambulatory Visit: Payer: Self-pay | Admitting: Physician Assistant

## 2015-06-13 ENCOUNTER — Other Ambulatory Visit (HOSPITAL_COMMUNITY): Payer: Self-pay | Admitting: Cardiology

## 2015-06-14 ENCOUNTER — Other Ambulatory Visit: Payer: Self-pay | Admitting: *Deleted

## 2015-06-14 ENCOUNTER — Other Ambulatory Visit: Payer: Self-pay | Admitting: Physician Assistant

## 2015-06-14 DIAGNOSIS — J42 Unspecified chronic bronchitis: Secondary | ICD-10-CM

## 2015-06-14 MED ORDER — UMECLIDINIUM BROMIDE 62.5 MCG/INH IN AEPB: 1.0000 | INHALATION_SPRAY | Freq: Every day | RESPIRATORY_TRACT | Status: DC

## 2015-06-21 ENCOUNTER — Telehealth: Payer: Self-pay | Admitting: *Deleted

## 2015-06-21 NOTE — Telephone Encounter (Signed)
Daughter came into clinic today stating that her father needs a letter from the doctor stating he is not able to manage his own finances.  She states that he is receiving his monthly checks on the first and is spending it on drugs before the week is over.  She used to be in charge of his finances but it became too much.  Would like for you to write this letter so he can be assigned a legal financial power of attorney.  Advised daughter to have patient make an appt to see you soon.  Jermiah Howton,CMA

## 2015-07-09 ENCOUNTER — Encounter: Payer: Self-pay | Admitting: Family Medicine

## 2015-07-09 ENCOUNTER — Ambulatory Visit (INDEPENDENT_AMBULATORY_CARE_PROVIDER_SITE_OTHER): Payer: Medicare Other | Admitting: Family Medicine

## 2015-07-09 VITALS — BP 129/71 | HR 101 | Temp 98.4°F | Ht 72.0 in | Wt 208.0 lb

## 2015-07-09 DIAGNOSIS — M545 Low back pain, unspecified: Secondary | ICD-10-CM

## 2015-07-09 DIAGNOSIS — Z23 Encounter for immunization: Secondary | ICD-10-CM

## 2015-07-09 DIAGNOSIS — I5020 Unspecified systolic (congestive) heart failure: Secondary | ICD-10-CM

## 2015-07-09 DIAGNOSIS — I480 Paroxysmal atrial fibrillation: Secondary | ICD-10-CM | POA: Diagnosis not present

## 2015-07-09 DIAGNOSIS — M79605 Pain in left leg: Secondary | ICD-10-CM

## 2015-07-09 DIAGNOSIS — I509 Heart failure, unspecified: Secondary | ICD-10-CM | POA: Diagnosis not present

## 2015-07-09 MED ORDER — GABAPENTIN 300 MG PO CAPS: 300.0000 mg | ORAL_CAPSULE | Freq: Three times a day (TID) | ORAL | Status: DC

## 2015-07-09 NOTE — Assessment & Plan Note (Signed)
Left hip and thigh pain most likely due to MSK etiology.  No red flags - Increase gabapentin 300 mg 3 times a day - Advised getting an MRI as previously ordered due to patient's history of cancer and left femur fracture that he has no recollection of - High threshold to start a by mouth narcotics due to patient's history of substance abuse and ongoing struggles with this

## 2015-07-09 NOTE — Patient Instructions (Signed)
It was great seeing you today.   1. Take gabapentin 300mg  up to three times a day for your leg pain.  2. Get the MRI of your hip at [facility] 3. Call your heart doctor today to discuss your current heart medications.    Please bring all your medications to every doctors visit  Sign up for My Chart to have easy access to your labs results, and communication with your Primary care physician.  Next Appointment  Please call to make an appointment with Dr Berkley Harvey in 1 month for left hip pain   I look forward to talking with you again at our next visit. If you have any questions or concerns before then, please call the clinic at [phone].  Take Care,   Dr [name]

## 2015-07-09 NOTE — Assessment & Plan Note (Signed)
Patient not wearing LifeVest today; also reports he is not taking Coreg, amiodarone, statin because these medications ran out and were never refilled.  Denies being contacted by cardiologist after last visit to discuss EP referral - Advised patient to contact cardiologist immediately to resume necessary heart failure and atrial fibrillation medication

## 2015-07-09 NOTE — Progress Notes (Signed)
  Patient name: Paul Clayton MRN [id number]  Date of birth: [birth year]  CC & HPI:  Paul Clayton is a 69 y.o. male presenting today for follow-up for left hip/thigh pain.  He continued to report achy left hip pain that radiates to his left knee.  Worse with weather changes.  Denies any numbness or weakness.  Denies any night sweats, fevers, chills.  He never got MRI that was recommended from last visit.  Takes 3-4 100 mg gabapentin as needed, which helps some.   ROS: See HPI   Medications & Allergies: Reviewed  Social History: Reviewed:   Objective Findings:  Vitals: BP 129/71 mmHg  Pulse 101  Temp(Src) 98.4 F (36.9 C) (Oral)  Ht 6' (1.829 m)  Wt 208 lb (94.348 kg)  BMI 28.20 kg/m2  Gen: NAD Back: Normal skin w/o rash, Spine with normal alignment and no deformity. no tenderness to vertebral process palpation.  Paraspinous muscles are non tender and without spasm.  Left straight leg raise negative  Assessment & Plan:   LBP radiating to left leg Left hip and thigh pain most likely due to MSK etiology.  No red flags - Increase gabapentin 300 mg 3 times a day - Advised getting an MRI as previously ordered due to patient's history of cancer and left femur fracture that he has no recollection of - High threshold to start a by mouth narcotics due to patient's history of substance abuse and ongoing struggles with this

## 2015-07-10 DIAGNOSIS — Z5181 Encounter for therapeutic drug level monitoring: Secondary | ICD-10-CM | POA: Diagnosis not present

## 2015-07-12 ENCOUNTER — Ambulatory Visit: Payer: Medicare Other | Admitting: Physician Assistant

## 2015-07-17 ENCOUNTER — Emergency Department (HOSPITAL_COMMUNITY)
Admission: EM | Admit: 2015-07-17 | Discharge: 2015-07-17 | Disposition: A | Discharge status: Home / Self Care | Payer: Medicare Other | Attending: Emergency Medicine | Admitting: Emergency Medicine

## 2015-07-17 ENCOUNTER — Emergency Department (HOSPITAL_COMMUNITY): Payer: Medicare Other

## 2015-07-17 ENCOUNTER — Encounter (HOSPITAL_COMMUNITY): Payer: Self-pay

## 2015-07-17 ENCOUNTER — Telehealth: Payer: Self-pay | Admitting: *Deleted

## 2015-07-17 DIAGNOSIS — Z9889 Other specified postprocedural states: Secondary | ICD-10-CM | POA: Diagnosis not present

## 2015-07-17 DIAGNOSIS — R072 Precordial pain: Secondary | ICD-10-CM | POA: Diagnosis not present

## 2015-07-17 DIAGNOSIS — F329 Major depressive disorder, single episode, unspecified: Secondary | ICD-10-CM | POA: Insufficient documentation

## 2015-07-17 DIAGNOSIS — I480 Paroxysmal atrial fibrillation: Secondary | ICD-10-CM | POA: Diagnosis not present

## 2015-07-17 DIAGNOSIS — Z85038 Personal history of other malignant neoplasm of large intestine: Secondary | ICD-10-CM | POA: Insufficient documentation

## 2015-07-17 DIAGNOSIS — R0789 Other chest pain: Secondary | ICD-10-CM | POA: Insufficient documentation

## 2015-07-17 DIAGNOSIS — Z720 Tobacco use: Secondary | ICD-10-CM | POA: Insufficient documentation

## 2015-07-17 DIAGNOSIS — G8929 Other chronic pain: Secondary | ICD-10-CM | POA: Insufficient documentation

## 2015-07-17 DIAGNOSIS — I2510 Atherosclerotic heart disease of native coronary artery without angina pectoris: Secondary | ICD-10-CM | POA: Insufficient documentation

## 2015-07-17 DIAGNOSIS — R079 Chest pain, unspecified: Secondary | ICD-10-CM | POA: Diagnosis not present

## 2015-07-17 DIAGNOSIS — G43909 Migraine, unspecified, not intractable, without status migrainosus: Secondary | ICD-10-CM | POA: Diagnosis not present

## 2015-07-17 DIAGNOSIS — M1990 Unspecified osteoarthritis, unspecified site: Secondary | ICD-10-CM | POA: Insufficient documentation

## 2015-07-17 DIAGNOSIS — Z8619 Personal history of other infectious and parasitic diseases: Secondary | ICD-10-CM | POA: Diagnosis not present

## 2015-07-17 DIAGNOSIS — I5022 Chronic systolic (congestive) heart failure: Secondary | ICD-10-CM | POA: Insufficient documentation

## 2015-07-17 DIAGNOSIS — K219 Gastro-esophageal reflux disease without esophagitis: Secondary | ICD-10-CM | POA: Diagnosis not present

## 2015-07-17 DIAGNOSIS — Z79899 Other long term (current) drug therapy: Secondary | ICD-10-CM | POA: Insufficient documentation

## 2015-07-17 DIAGNOSIS — J449 Chronic obstructive pulmonary disease, unspecified: Secondary | ICD-10-CM | POA: Diagnosis not present

## 2015-07-17 DIAGNOSIS — F419 Anxiety disorder, unspecified: Secondary | ICD-10-CM | POA: Insufficient documentation

## 2015-07-17 LAB — BASIC METABOLIC PANEL
ANION GAP: 8 (ref 5–15)
BUN: 15 mg/dL (ref 6–20)
CALCIUM: 8.9 mg/dL (ref 8.9–10.3)
CO2: 25 mmol/L (ref 22–32)
Chloride: 102 mmol/L (ref 101–111)
Creatinine, Ser: 0.93 mg/dL (ref 0.61–1.24)
GFR calc Af Amer: >60 mL/min (ref >60)
GFR calc non Af Amer: >60 mL/min (ref >60)
Glucose, Bld: 91 mg/dL (ref 65–99)
Potassium: 4.7 mmol/L (ref 3.5–5.1)
Sodium: 135 mmol/L (ref 135–145)

## 2015-07-17 LAB — CBC
HCT: 39.7 % (ref 39.0–52.0)
Hemoglobin: 13.5 g/dL (ref 13.0–17.0)
MCH: 33.6 pg (ref 26.0–34.0)
MCHC: 34 g/dL (ref 30.0–36.0)
MCV: 98.8 fL (ref 78.0–100.0)
Platelets: 177 10*3/uL (ref 150–400)
RBC: 4.02 MIL/uL — AB (ref 4.22–5.81)
RDW: 12.7 % (ref 11.5–15.5)
WBC: 7.6 10*3/uL (ref 4.0–10.5)

## 2015-07-17 LAB — I-STAT TROPONIN, ED
TROPONIN I, POC: 0.01 ng/mL (ref 0.00–0.08)
TROPONIN I, POC: 0 ng/mL (ref 0.00–0.08)

## 2015-07-17 LAB — RAPID URINE DRUG SCREEN, HOSP PERFORMED
Amphetamines: NOT DETECTED
BARBITURATES: NOT DETECTED
BENZODIAZEPINES: NOT DETECTED
COCAINE: NOT DETECTED
Opiates: NOT DETECTED
Tetrahydrocannabinol: NOT DETECTED

## 2015-07-17 LAB — PROTIME-INR
INR: 1.23 (ref 0.00–1.49)
Prothrombin Time: 15.7 seconds — ABNORMAL HIGH (ref 11.6–15.2)

## 2015-07-17 LAB — D-DIMER, QUANTITATIVE: D-Dimer, Quant: <0.27 ug/mL-FEU (ref 0.00–0.48)

## 2015-07-17 MED ORDER — GI COCKTAIL ~~LOC~~
30.0000 mL | Freq: Once | ORAL | Status: AC
  Administered 2015-07-17: 30 mL via ORAL
  Filled 2015-07-17: qty 30

## 2015-07-17 MED ORDER — KETOROLAC TROMETHAMINE 30 MG/ML IJ SOLN
30.0000 mg | Freq: Once | INTRAMUSCULAR | Status: AC
  Administered 2015-07-17: 30 mg via INTRAVENOUS
  Filled 2015-07-17: qty 1

## 2015-07-17 MED ORDER — NITROGLYCERIN 0.4 MG SL SUBL
0.4000 mg | SUBLINGUAL_TABLET | SUBLINGUAL | Status: DC | PRN
  Administered 2015-07-17 (×2): 0.4 mg via SUBLINGUAL
  Filled 2015-07-17: qty 1

## 2015-07-17 NOTE — ED Notes (Signed)
Paged Cardiology to [facility]

## 2015-07-17 NOTE — Telephone Encounter (Signed)
Received a call from [facility] for a critical follow up on patient.  I had been called for a follow up visit a week ago to replace equipment and called patient multiple times with no return call.  Today a call came in again as he was in the ER yesterday for CP.  His vest had not discharged.  As of last download he was only wearing 12% of the time.  During the course of use(since Jan) he has had multiple pieces of equipment replaced due to being lost as he moves around frequently.  I have spoken to Dr Haroldine Laws today and due to the fact he has NICM(no data for use of vest) and non compliance he is going to end use of vest.  I have spoken with asset recovery and they will try and contact patient to return equipment.

## 2015-07-17 NOTE — Telephone Encounter (Signed)
Agree. He has NICM (lower risk for SCD) and has been noncompliant with Vest and unable to be reached for f/u. Would d/c Lifevest.

## 2015-07-17 NOTE — ED Notes (Signed)
Patient transported to X-ray 

## 2015-07-17 NOTE — ED Provider Notes (Addendum)
CSN: [id number]     Arrival date & time 07/17/15  0414 History   First MD Initiated Contact with Patient 07/17/15 0444     Chief Complaint  Patient presents with  . Chest Pain     (Consider location/radiation/quality/duration/timing/severity/associated sxs/prior Treatment) Patient is a 69 y.o. male presenting with chest pain. The history is provided by the patient.  Chest Pain Pain location:  Substernal area Pain quality: sharp   Pain radiates to:  Does not radiate Pain radiates to the back: no   Pain severity:  Moderate Onset quality:  Sudden Timing:  Intermittent Progression:  Unchanged Chronicity:  Recurrent Context: not breathing and no trauma   Relieved by:  Nothing Worsened by:  Nothing tried Ineffective treatments:  None tried Associated symptoms: no palpitations, no shortness of breath, no syncope and not vomiting   Risk factors: male sex     Past Medical History  Diagnosis Date  . Persistent atrial fibrillation     a. s/p DCCV; b. Amiodarone  . Hypertension   . Chronic systolic CHF (congestive heart failure)     a. EF previously 15%; b. Echo 7/16:  EF 30-35%, diff HK, gr 1 DD, mild MR, mild LAE, mild reduced RVSF  . NICM (nonischemic cardiomyopathy)     no obs CAD on LHC in 1/16 - ? HTN or substance abuse  . CAD (coronary artery disease)     a. LHC 1/16:  LM 20%, oLAD 30%, oLCx 30%, pRCA 30%  . Adenocarcinoma of colon     STAGE 1  . Substance abuse     ETOH; crack cocaine  . COPD (chronic obstructive pulmonary disease)   . Tobacco abuse   . GERD (gastroesophageal reflux disease)   . Hepatitis C   . Migraine     "last one was back in the 1980's" (04/19/2015)  . Arthritis     "left hip" (04/19/2015)  . Chronic lower back pain   . Anxiety   . Depression    Past Surgical History  Procedure Laterality Date  . Cardioversion      "I've had 2 in all" (04/19/2015)  . Colonoscopy    . Left and right heart catheterization with coronary angiogram N/A 10/23/2014     Procedure: LEFT AND RIGHT HEART CATHETERIZATION WITH CORONARY ANGIOGRAM;  Surgeon: Larey Dresser, MD;  Location: [facility] CATH LAB;  Service: Cardiovascular;  Laterality: N/A;  . Colonoscopy with propofol N/A 11/30/2014    Procedure: COLONOSCOPY WITH PROPOFOL;  Surgeon: Milus Banister, MD;  Location: [facility] ENDOSCOPY;  Service: Endoscopy;  Laterality: N/A;  . Cardioversion N/A 01/30/2015    Procedure: CARDIOVERSION;  Surgeon: Larey Dresser, MD;  Location: [facility] ENDOSCOPY;  Service: Cardiovascular;  Laterality: N/A;  . Cardiac catheterization  10/23/2014   Family History  Problem Relation Age of Onset  . Hypertension Mother    Social History  Substance Use Topics  . Smoking status: Light Tobacco Smoker -- 0.10 packs/day for 52 years    Types: Cigarettes    Last Attempt to Quit: 09/01/2014  . Smokeless tobacco: Never Used     Comment: 04/19/2015 "probably smoke 1 pack cigarettes/month"  . Alcohol Use: 0.0 oz/week    0 Standard drinks or equivalent per week     Comment: 04/19/2015 "I might drink a 40oz beer/month"    Review of Systems  Respiratory: Negative for shortness of breath.   Cardiovascular: Positive for chest pain. Negative for palpitations, leg swelling and syncope.  Gastrointestinal: Negative for vomiting.  All other systems reviewed and are negative.     Allergies  Review of patient's allergies indicates no known allergies.  Home Medications   Prior to Admission medications   Medication Sig Start Date End Date Taking? Authorizing Provider  carvedilol (COREG) 3.125 MG tablet TAKE 1 TABLET (3.125 MG TOTAL) BY MOUTH TWO   (TWO) TIMES DAILY WITH A MEAL. 06/13/15  Yes Larey Dresser, MD  furosemide (LASIX) 40 MG tablet TAKE 1 TABLET BY MOUTH   DAILY 05/16/15  Yes Larey Dresser, MD  gabapentin (NEURONTIN) 300 MG capsule Take 1 capsule (300 mg total) by mouth 3 (three) times daily. 07/09/15  Yes Olam Idler, MD  losartan (COZAAR) 25 MG tablet TAKE 1 TABLET (25 MG TOTAL) BY MOUTH  DAILY. APPOINTMENT NEEDED FOR REFILLS 06/15/15  Yes Shaune Pascal Bensimhon, MD  XARELTO 20 MG TABS tablet TAKE ONE TABLET   BY MOUTH   DAILY 05/16/15  Yes Larey Dresser, MD  albuterol-ipratropium (COMBIVENT) 18-103 MCG/ACT inhaler Inhale 1 puff into the lungs every 4 (four) hours as needed for wheezing or shortness of breath. Patient not taking: Reported on 07/09/2015 09/12/14   Olam Idler, MD  amiodarone (PACERONE) 200 MG tablet Take 1 tablet (200 mg total) by mouth daily. Patient not taking: Reported on 07/09/2015 02/23/15   Larey Dresser, MD  atorvastatin (LIPITOR) 20 MG tablet TAKE 1 TABLET BY MOUTH DAILY Patient not taking: Reported on 07/09/2015 02/21/15   Larey Dresser, MD  mometasone-formoterol [facility]) 200-5 MCG/ACT AERO Inhale 2 puffs into the lungs 2 (two) times daily as needed for wheezing. Patient not taking: Reported on 07/09/2015 01/06/15   Leone Brand, MD  pantoprazole (PROTONIX) 40 MG tablet Take 1 tablet (40 mg total) by mouth daily. Patient not taking: Reported on 07/09/2015 05/04/15   Mercy Riding, MD  spironolactone (ALDACTONE) 25 MG tablet Take 0.5 tablets (12.5 mg total) by mouth daily. Patient not taking: Reported on 07/09/2015 11/28/14   Baron Sane, PA-C  Umeclidinium Bromide (INCRUSE ELLIPTA) 62.5 MCG/INH AEPB Inhale 1 Inhaler into the lungs daily. Patient not taking: Reported on 07/17/2015 06/14/15   Olam Idler, MD   BP 114/67 mmHg  Pulse 72  Temp(Src) 97.9 F (36.6 C) (Oral)  Resp 11  SpO2 98% Physical Exam  Constitutional: He is oriented to person, place, and time. He appears well-developed and well-nourished. No distress.  HENT:  Head: Normocephalic and atraumatic.  Mouth/Throat: Oropharynx is clear and moist.  Eyes: Conjunctivae are normal. Pupils are equal, round, and reactive to light.  Neck: Normal range of motion. Neck supple.  Cardiovascular: Normal rate, regular rhythm and intact distal pulses.   Pulmonary/Chest: Effort normal and breath sounds  normal. No respiratory distress. He has no wheezes. He has no rales. He exhibits no tenderness.  Abdominal: Soft. Bowel sounds are normal. There is no tenderness. There is no rebound and no guarding.  Musculoskeletal: Normal range of motion. He exhibits no edema.  Neurological: He is alert and oriented to person, place, and time.  Skin: Skin is warm and dry.  Psychiatric: He has a normal mood and affect.    ED Course  Procedures (including critical care time) Labs Review Labs Reviewed  CBC - Abnormal; Notable for the following:    RBC 4.02 (*)    All other components within normal limits  PROTIME-INR - Abnormal; Notable for the following:    Prothrombin Time 15.7 (*)    All other components within normal  limits  BASIC METABOLIC PANEL  URINE RAPID DRUG SCREEN, HOSP PERFORMED  I-STAT TROPOININ, ED    Imaging Review Dg Chest 2 View  07/17/2015   CLINICAL DATA:  Chest pain, onset 2 a.m.  EXAM: CHEST  2 VIEW  COMPARISON:  05/03/2015  FINDINGS: Normal heart size and mediastinal contours. No acute infiltrate or edema. No effusion or pneumothorax.  Remote coracoclavicular ligament injury on the right. No acute osseous findings.  IMPRESSION: No active cardiopulmonary disease.   Electronically Signed   By: Monte Fantasia M.D.   On: 07/17/2015 04:57   I have personally reviewed and evaluated these images and lab results as part of my medical decision-making.   EKG Interpretation   Date/Time:  Tuesday July 17 2015 04:21:19 EDT Ventricular Rate:  82 PR Interval:  170 QRS Duration: 90 QT Interval:  431 QTC Calculation: 503 R Axis:   63 Text Interpretation:  Sinus rhythm LVH with secondary repolarization  abnormality Prolonged QT interval Confirmed by [facility]  MD, Emmaline Kluver  ([id number]) on 07/17/2015 5:00:09 AM      MDM   Final diagnoses:  None   Results for orders placed or performed during the hospital encounter of 20/94/70  Basic metabolic panel  Result Value Ref Range    Sodium 135 135 - 145 mmol/L   Potassium 4.7 3.5 - 5.1 mmol/L   Chloride 102 101 - 111 mmol/L   CO2 25 22 - 32 mmol/L   Glucose, Bld 91 65 - 99 mg/dL   BUN 15 6 - 20 mg/dL   Creatinine, Ser 0.93 0.61 - 1.24 mg/dL   Calcium 8.9 8.9 - 10.3 mg/dL   GFR calc non Af Amer >60 >60 mL/min   GFR calc Af Amer >60 >60 mL/min   Anion gap 8 5 - 15  CBC  Result Value Ref Range   WBC 7.6 4.0 - 10.5 K/uL   RBC 4.02 (L) 4.22 - 5.81 MIL/uL   Hemoglobin 13.5 13.0 - 17.0 g/dL   HCT 39.7 39.0 - 52.0 %   MCV 98.8 78.0 - 100.0 fL   MCH 33.6 26.0 - 34.0 pg   MCHC 34.0 30.0 - 36.0 g/dL   RDW 12.7 11.5 - 15.5 %   Platelets 177 150 - 400 K/uL  Urine rapid drug screen (hosp performed)  Result Value Ref Range   Opiates NONE DETECTED NONE DETECTED   Cocaine NONE DETECTED NONE DETECTED   Benzodiazepines NONE DETECTED NONE DETECTED   Amphetamines NONE DETECTED NONE DETECTED   Tetrahydrocannabinol NONE DETECTED NONE DETECTED   Barbiturates NONE DETECTED NONE DETECTED  Protime-INR  Result Value Ref Range   Prothrombin Time 15.7 (H) 11.6 - 15.2 seconds   INR 1.23 0.00 - 1.49  I-stat troponin, ED  Result Value Ref Range   Troponin i, poc 0.01 0.00 - 0.08 ng/mL   Comment 3           Dg Chest 2 View  07/17/2015   CLINICAL DATA:  Chest pain, onset 2 a.m.  EXAM: CHEST  2 VIEW  COMPARISON:  05/03/2015  FINDINGS: Normal heart size and mediastinal contours. No acute infiltrate or edema. No effusion or pneumothorax.  Remote coracoclavicular ligament injury on the right. No acute osseous findings.  IMPRESSION: No active cardiopulmonary disease.   Electronically Signed   By: Monte Fantasia M.D.   On: 07/17/2015 04:57    Will treat troponin at 3 hours and check Ddimer.  If negative will be safe for discharge as Dr. Aundra Dubin  reports no obstructive lesions on most recent cath  Per Dr. Sung Amabile, Was not shocked by the vest as it secretes blue jelly  Troponins and Ddimer are negative will d/c to follow up with Dr.  Aundra Dubin.  Strict return precautions given  April Palumbo, MD 07/18/15 [id number]

## 2015-07-17 NOTE — ED Notes (Signed)
Dr Randal Buba requested for Spoke with [facility] Rep.

## 2015-07-17 NOTE — ED Notes (Addendum)
Contacted [employer] and Spoke with Receptionist, He states they don't have anyone on call at this time, and that a Rep. will be out between 8-9am to integrate the [place] states that they have been trying to get into contact with the patient for a few weeks and they have been unsuccessful. RN and MD made aware.

## 2015-07-17 NOTE — ED Notes (Signed)
Per EMS - pt began having mid to left CP around 0200. Intermittent and sharp. Some ST elevation on 12-lead. Pt has external defibrillator/life-vest. C/o nausea and shortness of breath. Initial BP 140/70, after 1 nitro SBP 100. Given 324mg  aspirin, 1 nitro, 4mg  zofran.

## 2015-07-22 ENCOUNTER — Emergency Department (HOSPITAL_COMMUNITY)
Admission: EM | Admit: 2015-07-22 | Discharge: 2015-07-22 | Disposition: A | Discharge status: Home / Self Care | Payer: Medicare Other | Attending: Emergency Medicine | Admitting: Emergency Medicine

## 2015-07-22 ENCOUNTER — Encounter (HOSPITAL_COMMUNITY): Payer: Self-pay | Admitting: Emergency Medicine

## 2015-07-22 DIAGNOSIS — Z9889 Other specified postprocedural states: Secondary | ICD-10-CM | POA: Diagnosis not present

## 2015-07-22 DIAGNOSIS — Z8619 Personal history of other infectious and parasitic diseases: Secondary | ICD-10-CM | POA: Insufficient documentation

## 2015-07-22 DIAGNOSIS — Z79899 Other long term (current) drug therapy: Secondary | ICD-10-CM | POA: Diagnosis not present

## 2015-07-22 DIAGNOSIS — G8929 Other chronic pain: Secondary | ICD-10-CM | POA: Diagnosis not present

## 2015-07-22 DIAGNOSIS — Z86018 Personal history of other benign neoplasm: Secondary | ICD-10-CM | POA: Diagnosis not present

## 2015-07-22 DIAGNOSIS — Z8659 Personal history of other mental and behavioral disorders: Secondary | ICD-10-CM | POA: Insufficient documentation

## 2015-07-22 DIAGNOSIS — I2510 Atherosclerotic heart disease of native coronary artery without angina pectoris: Secondary | ICD-10-CM | POA: Insufficient documentation

## 2015-07-22 DIAGNOSIS — J449 Chronic obstructive pulmonary disease, unspecified: Secondary | ICD-10-CM | POA: Insufficient documentation

## 2015-07-22 DIAGNOSIS — Z720 Tobacco use: Secondary | ICD-10-CM | POA: Diagnosis not present

## 2015-07-22 DIAGNOSIS — I10 Essential (primary) hypertension: Secondary | ICD-10-CM | POA: Diagnosis not present

## 2015-07-22 DIAGNOSIS — K219 Gastro-esophageal reflux disease without esophagitis: Secondary | ICD-10-CM | POA: Insufficient documentation

## 2015-07-22 DIAGNOSIS — M545 Low back pain: Secondary | ICD-10-CM | POA: Diagnosis not present

## 2015-07-22 DIAGNOSIS — M25552 Pain in left hip: Secondary | ICD-10-CM | POA: Insufficient documentation

## 2015-07-22 DIAGNOSIS — G43909 Migraine, unspecified, not intractable, without status migrainosus: Secondary | ICD-10-CM | POA: Diagnosis not present

## 2015-07-22 MED ORDER — HYDROMORPHONE HCL 1 MG/ML IJ SOLN
1.0000 mg | Freq: Once | INTRAMUSCULAR | Status: AC
  Administered 2015-07-22: 1 mg via INTRAMUSCULAR
  Filled 2015-07-22: qty 1

## 2015-07-22 MED ORDER — TRAMADOL HCL 50 MG PO TABS: 50.0000 mg | ORAL_TABLET | Freq: Four times a day (QID) | ORAL | Status: DC | PRN

## 2015-07-22 NOTE — Discharge Instructions (Signed)
Continue current medications. Take tramadol for severe pain. Follow up with primary care doctor. Return if worsening symptoms.   Chronic Pain Chronic pain can be defined as pain that is off and on and lasts for 3-6 months or longer. Many things cause chronic pain, which can make it difficult to make a diagnosis. There are many treatment options available for chronic pain. However, finding a treatment that works well for you may require trying various approaches until the right one is found. Many people benefit from a combination of two or more types of treatment to control their pain. SYMPTOMS  Chronic pain can occur anywhere in the body and can range from mild to very severe. Some types of chronic pain include:  Headache.  Low back pain.  Cancer pain.  Arthritis pain.  Neurogenic pain. This is pain resulting from damage to nerves. People with chronic pain may also have other symptoms such as:  Depression.  Anger.  Insomnia.  Anxiety. DIAGNOSIS  Your health care provider will help diagnose your condition over time. In many cases, the initial focus will be on excluding possible conditions that could be causing the pain. Depending on your symptoms, your health care provider may order tests to diagnose your condition. Some of these tests may include:   Blood tests.   CT scan.   MRI.   X-rays.   Ultrasounds.   Nerve conduction studies.  You may need to see a specialist.  TREATMENT  Finding treatment that works well may take time. You may be referred to a pain specialist. He or she may prescribe medicine or therapies, such as:   Mindful meditation or yoga.  Shots (injections) of numbing or pain-relieving medicines into the spine or area of pain.  Local electrical stimulation.  Acupuncture.   Massage therapy.   Aroma, color, light, or sound therapy.   Biofeedback.   Working with a physical therapist to keep from getting stiff.   Regular, gentle  exercise.   Cognitive or behavioral therapy.   Group support.  Sometimes, surgery may be recommended.  HOME CARE INSTRUCTIONS   Take all medicines as directed by your health care provider.   Lessen stress in your life by relaxing and doing things such as listening to calming music.   Exercise or be active as directed by your health care provider.   Eat a healthy diet and include things such as vegetables, fruits, fish, and lean meats in your diet.   Keep all follow-up appointments with your health care provider.   Attend a support group with others suffering from chronic pain. SEEK MEDICAL CARE IF:   Your pain gets worse.   You develop a new pain that was not there before.   You cannot tolerate medicines given to you by your health care provider.   You have new symptoms since your last visit with your health care provider.  SEEK IMMEDIATE MEDICAL CARE IF:   You feel weak.   You have decreased sensation or numbness.   You lose control of bowel or bladder function.   Your pain suddenly gets much worse.   You develop shaking.  You develop chills.  You develop confusion.  You develop chest pain.  You develop shortness of breath.  MAKE SURE YOU:  Understand these instructions.  Will watch your condition.  Will get help right away if you are not doing well or get worse. Document Released: 06/28/2002 Document Revised: 06/08/2013 Document Reviewed: 04/01/2013 [facility] Patient Information 2015 [place], [place]. This  information is not intended to replace advice given to you by your health care provider. Make sure you discuss any questions you have with your health care provider. ° °

## 2015-07-22 NOTE — ED Provider Notes (Signed)
CSN: [id number]     Arrival date & time 07/22/15  2209 History  By signing my name below, I, Paul Clayton, attest that this documentation has been prepared under the direction and in the presence of Paul Kroeger, PA-C. Electronically Signed: Irene Clayton, ED Scribe. 07/22/2015. 10:19 PM.  Chief Complaint  Patient presents with  . Hip Pain   The history is provided by the patient. No language interpreter was used.  HPI Comments: Paul Clayton is a 69 y.o. Male with a hx of AFib, HTN, CAD, NICM, CHF, COPD, substance abuse, arthritis and adenocarcinoma of colon who presents to the Emergency Department complaining of chronic, shooting, radiating left hip pain since February. He states that his pain radiates from the hip all the way down his leg. Pt has been seen multiple times in the ED for the same symptoms and states he had x-rays performed two weeks ago. He states that he is having a flare up due to the weather change outside. He states that he has been seeing his PCP who recommended physical therapy but has yet to set this up for the patient. Pt states that his prescribed medication do not work. Denies new injury or trauma, bladder or bowel incontinence, numbness, or weakness. Pt is ambulatory with a cane.  PCP: Paul Myron, MD  Past Medical History  Diagnosis Date  . Persistent atrial fibrillation (HCC)     a. s/p DCCV; b. Amiodarone  . Hypertension   . Chronic systolic CHF (congestive heart failure) (HCC)     a. EF previously 15%; b. Echo 7/16:  EF 30-35%, diff HK, gr 1 DD, mild MR, mild LAE, mild reduced RVSF  . NICM (nonischemic cardiomyopathy) ([place])     no obs CAD on LHC in 1/16 - ? HTN or substance abuse  . CAD (coronary artery disease)     a. LHC 1/16:  LM 20%, oLAD 30%, oLCx 30%, pRCA 30%  . Adenocarcinoma of colon ([name])     STAGE 1  . Substance abuse     ETOH; crack cocaine  . COPD (chronic obstructive pulmonary disease) ([place])   . Tobacco abuse   . GERD  (gastroesophageal reflux disease)   . Hepatitis C   . Migraine     "last one was back in the 1980's" (04/19/2015)  . Arthritis     "left hip" (04/19/2015)  . Chronic lower back pain   . Anxiety   . Depression    Past Surgical History  Procedure Laterality Date  . Cardioversion      "I've had 2 in all" (04/19/2015)  . Colonoscopy    . Left and right heart catheterization with coronary angiogram N/A 10/23/2014    Procedure: LEFT AND RIGHT HEART CATHETERIZATION WITH CORONARY ANGIOGRAM;  Surgeon: Paul Dresser, MD;  Location: [facility] CATH LAB;  Service: Cardiovascular;  Laterality: N/A;  . Colonoscopy with propofol N/A 11/30/2014    Procedure: COLONOSCOPY WITH PROPOFOL;  Surgeon: Paul Banister, MD;  Location: [facility] ENDOSCOPY;  Service: Endoscopy;  Laterality: N/A;  . Cardioversion N/A 01/30/2015    Procedure: CARDIOVERSION;  Surgeon: Paul Dresser, MD;  Location: [facility] ENDOSCOPY;  Service: Cardiovascular;  Laterality: N/A;  . Cardiac catheterization  10/23/2014   Family History  Problem Relation Age of Onset  . Hypertension Mother    Social History  Substance Use Topics  . Smoking status: Light Tobacco Smoker -- 0.10 packs/day for 52 years    Types: Cigarettes    Last Attempt to  Quit: 09/01/2014  . Smokeless tobacco: Never Used     Comment: 04/19/2015 "probably smoke 1 pack cigarettes/month"  . Alcohol Use: 0.0 oz/week    0 Standard drinks or equivalent per week     Comment: 04/19/2015 "I might drink a 40oz beer/month"    Review of Systems  Musculoskeletal: Positive for arthralgias.  Neurological: Negative for weakness and numbness.  All other systems reviewed and are negative.  Allergies  Review of patient's allergies indicates no known allergies.  Home Medications   Prior to Admission medications   Medication Sig Start Date End Date Taking? Authorizing Provider  albuterol-ipratropium (COMBIVENT) 18-103 MCG/ACT inhaler Inhale 1 puff into the lungs every 4 (four) hours as needed for  wheezing or shortness of breath. Patient not taking: Reported on 07/09/2015 09/12/14   Olam Idler, MD  amiodarone (PACERONE) 200 MG tablet Take 1 tablet (200 mg total) by mouth daily. Patient not taking: Reported on 07/09/2015 02/23/15   Paul Dresser, MD  atorvastatin (LIPITOR) 20 MG tablet TAKE 1 TABLET BY MOUTH DAILY Patient not taking: Reported on 07/09/2015 02/21/15   Paul Dresser, MD  carvedilol (COREG) 3.125 MG tablet TAKE 1 TABLET (3.125 MG TOTAL) BY MOUTH TWO   (TWO) TIMES DAILY WITH A MEAL. 06/13/15   Paul Dresser, MD  furosemide (LASIX) 40 MG tablet TAKE 1 TABLET BY MOUTH   DAILY 05/16/15   Paul Dresser, MD  gabapentin (NEURONTIN) 300 MG capsule Take 1 capsule (300 mg total) by mouth 3 (three) times daily. 07/09/15   Olam Idler, MD  losartan (COZAAR) 25 MG tablet TAKE 1 TABLET (25 MG TOTAL) BY MOUTH DAILY. APPOINTMENT NEEDED FOR REFILLS 06/15/15   Jolaine Artist, MD  mometasone-formoterol [facility]) 200-5 MCG/ACT AERO Inhale 2 puffs into the lungs 2 (two) times daily as needed for wheezing. Patient not taking: Reported on 07/09/2015 01/06/15   Paul Brand, MD  pantoprazole (PROTONIX) 40 MG tablet Take 1 tablet (40 mg total) by mouth daily. Patient not taking: Reported on 07/09/2015 05/04/15   Paul Riding, MD  spironolactone (ALDACTONE) 25 MG tablet Take 0.5 tablets (12.5 mg total) by mouth daily. Patient not taking: Reported on 07/09/2015 11/28/14   Paul Sane, PA-C  Umeclidinium Bromide (INCRUSE ELLIPTA) 62.5 MCG/INH AEPB Inhale 1 Inhaler into the lungs daily. Patient not taking: Reported on 07/17/2015 06/14/15   Olam Idler, MD  XARELTO 20 MG TABS tablet TAKE ONE TABLET   BY MOUTH   DAILY 05/16/15   Paul Dresser, MD   Ht 6' (1.829 m)  Wt 208 lb (94.348 kg)  BMI 28.20 kg/m2  SpO2 95%  Physical Exam  Constitutional: He is oriented to person, place, and time. He appears well-developed and well-nourished.  HENT:  Head: Normocephalic and atraumatic.  Eyes: EOM  are normal.  Neck: Normal range of motion. Neck supple.  Cardiovascular: Normal rate.   Pulmonary/Chest: Effort normal.  Musculoskeletal: Normal range of motion.  Tenderness to palpation over left SI joint left buttock, left hip. Pain with range of motion of the left hip with straight and bent knee. Dorsal pedal pulses intact. No obvious swelling, erythema, warmth to the left hip.  Neurological: He is alert and oriented to person, place, and time.  Skin: Skin is warm and dry.  Psychiatric: He has a normal mood and affect. His behavior is normal.  Nursing note and vitals reviewed.   ED Course  Procedures (including critical care time) DIAGNOSTIC STUDIES: Oxygen Saturation  is 95% on RA, adequate by my interpretation.    COORDINATION OF CARE: 10:32 PM-Discussed treatment plan which includes dilaudid with pt at bedside and pt agreed to plan.    Labs Review Labs Reviewed - No data to display  Imaging Review No results found.    EKG Interpretation None      MDM   Final diagnoses:  Chronic left hip pain   Pt in ED with complaint of left hip pain, which is chronic, however worsening over the last few days. I reviewed patient's prior notes from primary care doctor, patient has been seen for the same hip pain in the past, patient has history of left femur fracture, has had prior imaging. Currently being treated with Neurontin. Patient requesting something strong for pain. I reviewed patient's primary care doctor's chart, they're trying to hold off on using opiates given patient's history of substance abuse and addiction. Only give him a shot of Dilaudid 1 mg IM 1 dose here, and will prescribe 10 tramadol's until he is able to follow up with his doctor. Patient states at this time pain is so severe that he is having difficult time ambulating. I explained to him that this does not mean that his doctor will start him on stronger medication, however this is just to manage this acute  exacerbation of his chronic pain. At this time there is no evidence of cauda equina. Patient is in no distress. He is stable for discharge  Filed Vitals:   07/22/15 2207 07/22/15 2211 07/22/15 2254 07/22/15 2328  BP:   135/80 135/93  Pulse:   99 95  Temp:   98.1 F (36.7 C) 98.1 F (36.7 C)  TempSrc:   Oral Oral  Resp:   18 18  Height:  6' (1.829 m)    Weight:  208 lb (94.348 kg)    SpO2: 95%  95% 96%    I personally performed the services described in this documentation, which was scribed in my presence. The recorded information has been reviewed and is accurate.   Jeannett Senior, PA-C 07/23/15 Panola Liu, MD 07/25/15 1248

## 2015-07-22 NOTE — ED Notes (Signed)
Per PTAR: Patient comes from home c/o chronic L hip pain.  Patient has had problems with L hip since February and needs L hip replacement.  Patient states this is a flare up, "when the weather changes, the pain gets worse."  Patient also states that his oral pain medicines/narcotics are not working and he usually comes here and gets a shot that helps the pain.  Per PTAR, patient uses cane and walked to the ambulance, as well as walked inside upon arrival to the ED.  PTAR VS: 164/96 (hx HTN), RR 16, HR 109, 95% RA.  Patient denies new injury/trauma.

## 2015-08-02 ENCOUNTER — Encounter (HOSPITAL_COMMUNITY): Payer: Self-pay

## 2015-08-02 ENCOUNTER — Ambulatory Visit (HOSPITAL_COMMUNITY)
Admission: RE | Admit: 2015-08-02 | Discharge: 2015-08-02 | Disposition: A | Discharge status: Home / Self Care | Payer: Medicare Other | Source: Ambulatory Visit | Attending: Cardiology | Admitting: Cardiology

## 2015-08-02 VITALS — BP 126/92 | HR 94 | Wt 214.8 lb

## 2015-08-02 DIAGNOSIS — R072 Precordial pain: Secondary | ICD-10-CM | POA: Diagnosis not present

## 2015-08-02 DIAGNOSIS — I480 Paroxysmal atrial fibrillation: Secondary | ICD-10-CM | POA: Diagnosis not present

## 2015-08-02 DIAGNOSIS — Z7902 Long term (current) use of antithrombotics/antiplatelets: Secondary | ICD-10-CM | POA: Diagnosis not present

## 2015-08-02 DIAGNOSIS — I5022 Chronic systolic (congestive) heart failure: Secondary | ICD-10-CM | POA: Diagnosis not present

## 2015-08-02 DIAGNOSIS — I428 Other cardiomyopathies: Secondary | ICD-10-CM | POA: Diagnosis not present

## 2015-08-02 DIAGNOSIS — J449 Chronic obstructive pulmonary disease, unspecified: Secondary | ICD-10-CM | POA: Diagnosis not present

## 2015-08-02 DIAGNOSIS — Z87891 Personal history of nicotine dependence: Secondary | ICD-10-CM | POA: Insufficient documentation

## 2015-08-02 DIAGNOSIS — E785 Hyperlipidemia, unspecified: Secondary | ICD-10-CM | POA: Insufficient documentation

## 2015-08-02 DIAGNOSIS — F1010 Alcohol abuse, uncomplicated: Secondary | ICD-10-CM | POA: Diagnosis not present

## 2015-08-02 DIAGNOSIS — F1410 Cocaine abuse, uncomplicated: Secondary | ICD-10-CM | POA: Insufficient documentation

## 2015-08-02 DIAGNOSIS — Z79899 Other long term (current) drug therapy: Secondary | ICD-10-CM | POA: Insufficient documentation

## 2015-08-02 DIAGNOSIS — B1920 Unspecified viral hepatitis C without hepatic coma: Secondary | ICD-10-CM | POA: Insufficient documentation

## 2015-08-02 DIAGNOSIS — C189 Malignant neoplasm of colon, unspecified: Secondary | ICD-10-CM | POA: Diagnosis not present

## 2015-08-02 DIAGNOSIS — K219 Gastro-esophageal reflux disease without esophagitis: Secondary | ICD-10-CM | POA: Insufficient documentation

## 2015-08-02 MED ORDER — PANTOPRAZOLE SODIUM 40 MG PO TBEC: 40.0000 mg | DELAYED_RELEASE_TABLET | Freq: Every day | ORAL | Status: DC

## 2015-08-02 MED ORDER — SPIRONOLACTONE 25 MG PO TABS: 25.0000 mg | ORAL_TABLET | Freq: Every day | ORAL | Status: DC

## 2015-08-02 MED ORDER — ATORVASTATIN CALCIUM 20 MG PO TABS: 20.0000 mg | ORAL_TABLET | Freq: Every day | ORAL | Status: DC

## 2015-08-02 MED ORDER — LOSARTAN POTASSIUM 25 MG PO TABS: 25.0000 mg | ORAL_TABLET | Freq: Every day | ORAL | Status: DC

## 2015-08-02 MED ORDER — AMIODARONE HCL 200 MG PO TABS: 200.0000 mg | ORAL_TABLET | Freq: Every day | ORAL | Status: DC

## 2015-08-02 NOTE — Patient Instructions (Addendum)
Follow up in 1 month  All medications have been called in  Labs in 2 weeks

## 2015-08-04 ENCOUNTER — Emergency Department (HOSPITAL_COMMUNITY): Payer: Medicare Other

## 2015-08-04 ENCOUNTER — Emergency Department (HOSPITAL_COMMUNITY)
Admission: EM | Admit: 2015-08-04 | Discharge: 2015-08-04 | Disposition: A | Discharge status: Home / Self Care | Payer: Medicare Other | Attending: Emergency Medicine | Admitting: Emergency Medicine

## 2015-08-04 DIAGNOSIS — J449 Chronic obstructive pulmonary disease, unspecified: Secondary | ICD-10-CM | POA: Diagnosis not present

## 2015-08-04 DIAGNOSIS — M7989 Other specified soft tissue disorders: Secondary | ICD-10-CM | POA: Diagnosis not present

## 2015-08-04 DIAGNOSIS — I2510 Atherosclerotic heart disease of native coronary artery without angina pectoris: Secondary | ICD-10-CM | POA: Insufficient documentation

## 2015-08-04 DIAGNOSIS — Z9889 Other specified postprocedural states: Secondary | ICD-10-CM | POA: Insufficient documentation

## 2015-08-04 DIAGNOSIS — I5022 Chronic systolic (congestive) heart failure: Secondary | ICD-10-CM | POA: Diagnosis not present

## 2015-08-04 DIAGNOSIS — F1410 Cocaine abuse, uncomplicated: Secondary | ICD-10-CM | POA: Insufficient documentation

## 2015-08-04 DIAGNOSIS — Z8619 Personal history of other infectious and parasitic diseases: Secondary | ICD-10-CM | POA: Diagnosis not present

## 2015-08-04 DIAGNOSIS — R0602 Shortness of breath: Secondary | ICD-10-CM | POA: Diagnosis not present

## 2015-08-04 DIAGNOSIS — R079 Chest pain, unspecified: Secondary | ICD-10-CM | POA: Diagnosis not present

## 2015-08-04 DIAGNOSIS — I10 Essential (primary) hypertension: Secondary | ICD-10-CM | POA: Diagnosis not present

## 2015-08-04 DIAGNOSIS — M1990 Unspecified osteoarthritis, unspecified site: Secondary | ICD-10-CM | POA: Insufficient documentation

## 2015-08-04 DIAGNOSIS — Z85038 Personal history of other malignant neoplasm of large intestine: Secondary | ICD-10-CM | POA: Insufficient documentation

## 2015-08-04 DIAGNOSIS — Z7901 Long term (current) use of anticoagulants: Secondary | ICD-10-CM | POA: Insufficient documentation

## 2015-08-04 DIAGNOSIS — I481 Persistent atrial fibrillation: Secondary | ICD-10-CM | POA: Insufficient documentation

## 2015-08-04 DIAGNOSIS — G8929 Other chronic pain: Secondary | ICD-10-CM | POA: Diagnosis not present

## 2015-08-04 DIAGNOSIS — Z79899 Other long term (current) drug therapy: Secondary | ICD-10-CM | POA: Diagnosis not present

## 2015-08-04 DIAGNOSIS — K219 Gastro-esophageal reflux disease without esophagitis: Secondary | ICD-10-CM | POA: Diagnosis not present

## 2015-08-04 DIAGNOSIS — M791 Myalgia: Secondary | ICD-10-CM | POA: Insufficient documentation

## 2015-08-04 DIAGNOSIS — F419 Anxiety disorder, unspecified: Secondary | ICD-10-CM | POA: Diagnosis not present

## 2015-08-04 DIAGNOSIS — F329 Major depressive disorder, single episode, unspecified: Secondary | ICD-10-CM | POA: Insufficient documentation

## 2015-08-04 DIAGNOSIS — G43909 Migraine, unspecified, not intractable, without status migrainosus: Secondary | ICD-10-CM | POA: Insufficient documentation

## 2015-08-04 DIAGNOSIS — Z720 Tobacco use: Secondary | ICD-10-CM | POA: Diagnosis not present

## 2015-08-04 DIAGNOSIS — R030 Elevated blood-pressure reading, without diagnosis of hypertension: Secondary | ICD-10-CM | POA: Diagnosis not present

## 2015-08-04 LAB — BASIC METABOLIC PANEL
ANION GAP: 8 (ref 5–15)
BUN: 13 mg/dL (ref 6–20)
CALCIUM: 9.6 mg/dL (ref 8.9–10.3)
CO2: 26 mmol/L (ref 22–32)
Chloride: 104 mmol/L (ref 101–111)
Creatinine, Ser: 0.89 mg/dL (ref 0.61–1.24)
GLUCOSE: 99 mg/dL (ref 65–99)
Potassium: 4 mmol/L (ref 3.5–5.1)
SODIUM: 138 mmol/L (ref 135–145)

## 2015-08-04 LAB — CBC
HEMATOCRIT: 42.6 % (ref 39.0–52.0)
Hemoglobin: 14.8 g/dL (ref 13.0–17.0)
MCH: 34 pg (ref 26.0–34.0)
MCHC: 34.7 g/dL (ref 30.0–36.0)
MCV: 97.9 fL (ref 78.0–100.0)
Platelets: 213 10*3/uL (ref 150–400)
RBC: 4.35 MIL/uL (ref 4.22–5.81)
RDW: 12.4 % (ref 11.5–15.5)
WBC: 8.9 10*3/uL (ref 4.0–10.5)

## 2015-08-04 LAB — I-STAT TROPONIN, ED
Troponin i, poc: 0.05 ng/mL (ref 0.00–0.08)
Troponin i, poc: 0.05 ng/mL (ref 0.00–0.08)

## 2015-08-04 MED ORDER — NITROGLYCERIN 2 % TD OINT
0.5000 [in_us] | TOPICAL_OINTMENT | Freq: Once | TRANSDERMAL | Status: AC
  Administered 2015-08-04: 0.5 [in_us] via TOPICAL
  Filled 2015-08-04: qty 1

## 2015-08-04 MED ORDER — ASPIRIN 81 MG PO CHEW
324.0000 mg | CHEWABLE_TABLET | Freq: Once | ORAL | Status: AC
  Administered 2015-08-04: 324 mg via ORAL
  Filled 2015-08-04: qty 4

## 2015-08-04 NOTE — ED Notes (Signed)
Patient here with complaint of chest pain secondary to "mannnn... I smoked some crack tonight". States he has been clean from drugs for 2 weeks, but relapsed tonight. EMS called out because patient was complaining of not feeling well. EMS reported that patient did not endorse chest pain and they did not do an EKG.

## 2015-08-04 NOTE — ED Provider Notes (Signed)
CSN: [id number]     Arrival date & time 08/04/15  0027 History  By signing my name below, I, Helane Gunther, attest that this documentation has been prepared under the direction and in the presence of Julianne Rice, MD. Electronically Signed: Helane Gunther, ED Scribe. 08/04/2015. 3:14 AM.    Chief Complaint  Patient presents with  . Chest Pain  . Addiction Problem   The history is provided by the patient. No language interpreter was used.   HPI Comments: Paul Clayton is a 69 y.o. male brought in by ambulance, who presents to the Emergency Department complaining of waxing and waning, sharp, aching, central chest pain onset after drinking a large amount of alcohol and doing crack cocaine at 2330. He reports a PMHx of CHF and A-fib, as well as a PSHx of stents in his arm. Pt denies back pain.  Past Medical History  Diagnosis Date  . Persistent atrial fibrillation (HCC)     a. s/p DCCV; b. Amiodarone  . Hypertension   . Chronic systolic CHF (congestive heart failure) (HCC)     a. EF previously 15%; b. Echo 7/16:  EF 30-35%, diff HK, gr 1 DD, mild MR, mild LAE, mild reduced RVSF  . NICM (nonischemic cardiomyopathy) ([place])     no obs CAD on LHC in 1/16 - ? HTN or substance abuse  . CAD (coronary artery disease)     a. LHC 1/16:  LM 20%, oLAD 30%, oLCx 30%, pRCA 30%  . Adenocarcinoma of colon ([place])     STAGE 1  . Substance abuse     ETOH; crack cocaine  . COPD (chronic obstructive pulmonary disease) ([place])   . Tobacco abuse   . GERD (gastroesophageal reflux disease)   . Hepatitis C   . Migraine     "last one was back in the 1980's" (04/19/2015)  . Arthritis     "left hip" (04/19/2015)  . Chronic lower back pain   . Anxiety   . Depression    Past Surgical History  Procedure Laterality Date  . Cardioversion      "I've had 2 in all" (04/19/2015)  . Colonoscopy    . Left and right heart catheterization with coronary angiogram N/A 10/23/2014    Procedure: LEFT AND RIGHT HEART  CATHETERIZATION WITH CORONARY ANGIOGRAM;  Surgeon: Larey Dresser, MD;  Location: [facility] CATH LAB;  Service: Cardiovascular;  Laterality: N/A;  . Colonoscopy with propofol N/A 11/30/2014    Procedure: COLONOSCOPY WITH PROPOFOL;  Surgeon: Milus Banister, MD;  Location: [facility] ENDOSCOPY;  Service: Endoscopy;  Laterality: N/A;  . Cardioversion N/A 01/30/2015    Procedure: CARDIOVERSION;  Surgeon: Larey Dresser, MD;  Location: [facility] ENDOSCOPY;  Service: Cardiovascular;  Laterality: N/A;  . Cardiac catheterization  10/23/2014   Family History  Problem Relation Age of Onset  . Hypertension Mother    Social History  Substance Use Topics  . Smoking status: Light Tobacco Smoker -- 0.10 packs/day for 52 years    Types: Cigarettes    Last Attempt to Quit: 09/01/2014  . Smokeless tobacco: Never Used     Comment: 04/19/2015 "probably smoke 1 pack cigarettes/month"  . Alcohol Use: 0.0 oz/week    0 Standard drinks or equivalent per week     Comment: 04/19/2015 "I might drink a 40oz beer/month"    Review of Systems  Constitutional: Negative for fever and chills.  Respiratory: Negative for cough and shortness of breath.   Cardiovascular: Positive for chest pain and  leg swelling.  Gastrointestinal: Negative for nausea, vomiting and abdominal pain.  Musculoskeletal: Positive for myalgias. Negative for back pain and neck stiffness.  Skin: Negative for rash and wound.  Neurological: Negative for dizziness, weakness, light-headedness, numbness and headaches.  All other systems reviewed and are negative.   Allergies  Review of patient's allergies indicates no known allergies.  Home Medications   Prior to Admission medications   Medication Sig Start Date End Date Taking? Authorizing Provider  amiodarone (PACERONE) 200 MG tablet Take 1 tablet (200 mg total) by mouth daily. 08/02/15  Yes Larey Dresser, MD  atorvastatin (LIPITOR) 20 MG tablet Take 1 tablet (20 mg total) by mouth daily. 08/02/15  Yes Larey Dresser, MD  carvedilol (COREG) 3.125 MG tablet TAKE 1 TABLET (3.125 MG TOTAL) BY MOUTH TWO   (TWO) TIMES DAILY WITH A MEAL. 06/13/15  Yes Larey Dresser, MD  furosemide (LASIX) 40 MG tablet TAKE 1 TABLET BY MOUTH   DAILY 05/16/15  Yes Larey Dresser, MD  gabapentin (NEURONTIN) 300 MG capsule Take 1 capsule (300 mg total) by mouth 3 (three) times daily. 07/09/15  Yes Olam Idler, MD  losartan (COZAAR) 25 MG tablet Take 1 tablet (25 mg total) by mouth daily. 08/02/15  Yes Larey Dresser, MD  mometasone-formoterol [facility]) 200-5 MCG/ACT AERO Inhale 2 puffs into the lungs 2 (two) times daily as needed for wheezing. Patient taking differently: Inhale 2 puffs into the lungs 2 (two) times daily as needed for wheezing.  01/06/15  Yes Leone Brand, MD  pantoprazole (PROTONIX) 40 MG tablet Take 1 tablet (40 mg total) by mouth daily. 08/02/15  Yes Larey Dresser, MD  spironolactone (ALDACTONE) 25 MG tablet Take 1 tablet (25 mg total) by mouth daily. 08/02/15  Yes Larey Dresser, MD  traMADol (ULTRAM) 50 MG tablet Take 1 tablet (50 mg total) by mouth every 6 (six) hours as needed. Patient taking differently: Take 50 mg by mouth every 6 (six) hours as needed for moderate pain.  07/22/15  Yes Tatyana Kirichenko, PA-C  XARELTO 20 MG TABS tablet TAKE ONE TABLET   BY MOUTH   DAILY 05/16/15  Yes Larey Dresser, MD  albuterol-ipratropium (COMBIVENT) 18-103 MCG/ACT inhaler Inhale 1 puff into the lungs every 4 (four) hours as needed for wheezing or shortness of breath. Patient not taking: Reported on 07/09/2015 09/12/14   Olam Idler, MD   BP 121/96 mmHg  Pulse 84  Temp(Src) 98.5 F (36.9 C) (Oral)  Resp 16  SpO2 96% Physical Exam  Constitutional: He is oriented to person, place, and time. He appears well-developed and well-nourished. No distress.  HENT:  Head: Normocephalic and atraumatic.  Mouth/Throat: Oropharynx is clear and moist.  Eyes: EOM are normal. Pupils are equal, round, and reactive to  light.  Neck: Normal range of motion. Neck supple.  Cardiovascular: Normal rate and regular rhythm.   Pulmonary/Chest: Effort normal and breath sounds normal. No respiratory distress. He has no wheezes. He has no rales. He exhibits no tenderness.  Abdominal: Soft. Bowel sounds are normal. He exhibits no distension and no mass. There is no tenderness. There is no rebound and no guarding.  Musculoskeletal: Normal range of motion. He exhibits no edema or tenderness.  Bilateral lower extremities are symmetrical. No calf tenderness or swelling. Distal pulses intact.  Neurological: He is alert and oriented to person, place, and time.  Moving all extremities without deficit. Sensation is fully intact.  Skin: Skin is  warm and dry. No rash noted. No erythema.  Psychiatric: He has a normal mood and affect. His behavior is normal.  Nursing note and vitals reviewed.   ED Course  Procedures  DIAGNOSTIC STUDIES: Oxygen Saturation is 98% on RA, normal by my interpretation.    COORDINATION OF CARE: 3:05 AM - Discussed plans to order diagnostic studies and imaging. Pt advised of plan for treatment and pt agrees.  Labs Review Labs Reviewed  BASIC METABOLIC PANEL  CBC  I-STAT [place], ED  Randolm Idol, ED    Imaging Review Dg Chest 2 View  08/04/2015  CLINICAL DATA:  Shortness of breath and chest pain for 3 hours. History of COPD. EXAM: CHEST  2 VIEW COMPARISON:  07/17/2015 FINDINGS: Normal heart size and pulmonary vascularity. Mediastinal contours appear intact. Hyperinflation of the lungs consistent with emphysema. Fibrosis in the lung bases. No focal airspace disease or consolidation in the lungs. No blunting of costophrenic angles. No pneumothorax. IMPRESSION: Emphysematous changes in the lungs. No evidence of active pulmonary disease. Electronically Signed   By: Lucienne Capers M.D.   On: 08/04/2015 01:28   I have personally reviewed and evaluated these images and lab results as part of  my medical decision-making.   EKG Interpretation   Date/Time:  Saturday August 04 2015 00:42:10 EDT Ventricular Rate:  107 PR Interval:  144 QRS Duration: 98 QT Interval:  378 QTC Calculation: 504 R Axis:   78 Text Interpretation:  Sinus tachycardia Left ventricular hypertrophy with  repolarization abnormality Abnormal ECG Confirmed by Lita Mains  MD, Careem Yasui  ([id number]) on 08/04/2015 3:48:35 AM      MDM   Final diagnoses:  Chest pain, unspecified chest pain type  Cocaine abuse   I, Jerlene Rockers, personally performed the services described in this documentation. All medical record entries made by the scribe were at my direction and in my presence.  I have reviewed the chart and discharge instructions and agree that the record reflects my personal performance and is accurate and complete. Allyana Vogan.  08/04/2015. 5:56 AM.   Patient is currently pain-free. Observed in the emergency department for 5 hours. EKG without evidence of ischemia. Troponin 2 are normal. I believe patient is safe to be discharged home. He's been advised to stop using cocaine. He understands the need to follow-up with his cardiologist Dr. Aundra Dubin. States he will call and make an appointment to be seen early next week. He also understands the need to return immediately for any worsening pain, shortness of breath or any concerns.  Julianne Rice, MD 08/04/15 [phone]

## 2015-08-04 NOTE — Progress Notes (Signed)
Patient ID: Paul Clayton, male   DOB: [birth year], 69 y.o.   MRN: [id number] PCP: Dr. Berkley Harvey  69 yo with history of COPD, ETOH and cocaine abuse, HCV, colon cancer, paroxysmal atrial fibrillation, and chronic systolic CHF presents for cardiology evaluation.  He moved recently to [place] from [place].  He says that he has had a cardiomyopathy known since 2012. This has been presumed to be due to cocaine and ETOH abuse.  He is also in persistent atrial fibrillation.  He says that he has been cardioverted twice, the last time was a few months ago in [place].  He is not sure whether he went back into NSR or not.  He is currently in atrial fibrillation.  He says that he quit ETOH, cocaine, and smoking in 10/15.  He was admitted to [facility] in 12/15 with atrial fibrillation and RVR. He had been out of his Coreg x 2 weeks.  He was diuresed and rate-controlled.  He was just discharged.  He had had an episode of syncope prior to admission that was thought to be orthostatic given low BP in the setting of atrial fibrillation with RVR.  Echo in 11/15 showed EF 15% with severe MR and TR and moderately decreased RV systolic function.    LHC/RHC in 1/16 showed no obstructive CAD and mildly elevated filling pressures.  Lasix was increased to 40 mg daily. He was admitted earlier in 1/16 with syncope, possibly defecation syncope.  He has a Lifevest.  He was admitted again on 11/09/14 with presyncope and possible COPD exacerbation.  He was started on a prednisone course and Coreg was decreased to 6.25 mg bid.   He had a colonoscopy in 11/15 with a mass found, biopsy showed adenocarcinoma.  Most recent GI evaluation suggests that he will not need colectomy.   In 4/16, he underwent DCCV to NSR.  He was bradycardic, so Coreg was stopped.    He was admitted in 7/16 with cocaine-related chest pain.    He returns for follow up.  He remains in NSR.  He has been out of losartan, spironolactone, and amiodarone.  He says  that he has not used cocaine since 7/16.  Fatigue after walking 1/2 block and has to rest but no dyspnea. Denies PND/Orthopnea.  Main complaint and main limitation is left hip and back pain.  Left hip films showed an old healed femur fracture.  He has an appointment to see orthopedics.  He is still wearing his Lifevest about an hour a day.  He has epigastric and chest discomfort occasionally when he lies down at night.  No exertional chest pain.   Labs (12/15): K 4.3, creatinine 0.82, AST normal, ALT 60, HCT 40.9, digoxin 0.7, BNP 4401, HIV negative Labs (10/28/2014): K 4.5 Creatinine 0.90, digoxin 0.9, LFTs normal  Labs (1/16): K 4.3, creatinine 1.11, HCT 41.5, BNP 255, digoxin 0.6 Labs (12/06/2014): K 4.3 Creatinine 1.18  Labs (4/16): AST 53, ALT 60 Labs (5/16): K 4.3, creatinine 1.17, digoxin 0.7, HCT 36.6, BNP 97.5 Labs (7/16): AST 46, ALT 49, TSH normal Labs (9/16): K 4.7, creatinine 0.93, HCT 39.7  ECG: NSR, LVH with repolarization abnormality.   PMH: 1. COPD: Quit smoking in 10/15.  2. Cocaine abuse: Last in 7/16..  3. ETOH abuse: Quit ETOH in 10/15.  4. Atrial fibrillation: Paroxysmal.  Reports DCCV x 2 in [place].  DCCV 4/16 => bradycardic => cut back Coreg.  5. HCV 6. Colon cancer: Colonoscopy in [place] with removal of polyp  showing colonic adenocarcinoma on pathology.  PET scan in 12/15: probably no metastatic disease.   7. H/o syncope: Thought to be orthostatic. Also had vagal-type syncope associated with defecation.  8. Cardiomyopathy: Known since 2012.  Echo (11/15) with EF 15%, diffuse hypokinesis, severe LV dilation, severe MR (likely functional), moderate to severe LAE, dilated RV with moderately decreased systolic function, severe TR.  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  HIV negative.  LHC/RHC (1/16) with nonobstructive CAD, EF 20-25%, mean RA 9, PA 44/26, mean PCWP 21, CI 2.1.  Echo (7/16) with EF 30-35%, mildly dilated LV, mildly decreased RV systolic function.   9. GERD 10.  Sciatica 11. H/o left femur fracture  SH: Prior ETOH, smoking, and cocaine abuse. Last cocaine 7/16.  Moved here from [place].  Daughter lives in [place].   FH: No premature CAD.   ROS: All systems reviewed and negative except as per HPI.   Current Outpatient Prescriptions  Medication Sig Dispense Refill  . carvedilol (COREG) 3.125 MG tablet TAKE 1 TABLET (3.125 MG TOTAL) BY MOUTH TWO   (TWO) TIMES DAILY WITH A MEAL. 60 tablet 3  . furosemide (LASIX) 40 MG tablet TAKE 1 TABLET BY MOUTH   DAILY 180 tablet 3  . gabapentin (NEURONTIN) 300 MG capsule Take 1 capsule (300 mg total) by mouth 3 (three) times daily. 90 capsule 1  . traMADol (ULTRAM) 50 MG tablet Take 1 tablet (50 mg total) by mouth every 6 (six) hours as needed. (Patient taking differently: Take 50 mg by mouth every 6 (six) hours as needed for moderate pain. ) 10 tablet 0  . XARELTO 20 MG TABS tablet TAKE ONE TABLET   BY MOUTH   DAILY 30 tablet 3  . albuterol-ipratropium (COMBIVENT) 18-103 MCG/ACT inhaler Inhale 1 puff into the lungs every 4 (four) hours as needed for wheezing or shortness of breath. (Patient not taking: Reported on 07/09/2015) 1 Inhaler 1  . amiodarone (PACERONE) 200 MG tablet Take 1 tablet (200 mg total) by mouth daily. 90 tablet 3  . atorvastatin (LIPITOR) 20 MG tablet Take 1 tablet (20 mg total) by mouth daily. 90 tablet 3  . losartan (COZAAR) 25 MG tablet Take 1 tablet (25 mg total) by mouth daily. 30 tablet 3  . mometasone-formoterol (DULERA) 200-5 MCG/ACT AERO Inhale 2 puffs into the lungs 2 (two) times daily as needed for wheezing. (Patient taking differently: Inhale 2 puffs into the lungs 2 (two) times daily as needed for wheezing. ) 13 g 2  . pantoprazole (PROTONIX) 40 MG tablet Take 1 tablet (40 mg total) by mouth daily. 30 tablet 11  . spironolactone (ALDACTONE) 25 MG tablet Take 1 tablet (25 mg total) by mouth daily. 90 tablet 3   No current facility-administered medications for this encounter.   BP  126/92 mmHg  Pulse 94  Wt 214 lb 12.8 oz (97.433 kg)  SpO2 98% General: NAD Neck: No JVD, no thyromegaly or thyroid nodule.  Lungs: Clear to auscultation bilaterally with normal respiratory effort. CV: Nondisplaced PMI.  Heart irregular S1/S2, no S3/S4, 2/6 HSM LLSB and apex.  No peripheral edema.  No carotid bruit.  Normal pedal pulses.  Abdomen: Soft, nontender, no hepatosplenomegaly, no distention.  Skin: Intact without lesions or rashes.  Neurologic: Alert and oriented x 3.  Psych: Normal affect. Extremities: No clubbing or cyanosis.  HEENT: Normal.   Assessment/Plan: 1. Chronic systolic CHF: Cardiomyopathy with LV EF 30-35% and mild RV dysfunction on 7/16 echo.  This is somewhat improved.  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  HIV negative.  NYHA class II currently, not volume overloaded.  He has been out of several meds.  - Continue Lasix 40 mg daily.   - Continue Coreg 3.125 mg bid.  - Restart losartan 25 mg daily and spironolactone 12.5 mg daily, BMET in 2 wks.  - Still using cocaine in 7/16, need to see at least 6 months abstinence before I would set him up for ICD evaluation.  He is only wearing the Lifevest sporadically.  At this point, I asked him to send it back.  - Social situation precarious (lives in boarding house, has been homeless)   2. Atrial fibrillation: s/p DCCV, now in NSR.  - Needs to restart amiodarone 200 mg daily - Follow LFTs and TSH on amiodarone.  Will need regular eye exams.  - Continue Xarelto, CBC with labs in 2 wks.  3. HCV: Follow LFTs closely with amiodarone (planning to restart).  4. COPD: Stopped smoking.  Uses Spiriva.  5. Colon cancer: Stable.  6. Syncope: No recurrence.  7. Hyperlipidemia: Can restart atorvastatin.  8. GERD: Suspect burning in chest while in bed at night is GERD. Will let him try Protonix.   Followup in 1 month.     Loralie Champagne 08/04/2015

## 2015-08-04 NOTE — ED Notes (Signed)
Discharge instructions reviewed with patient. Understanding verbalized. No distress noted. Patient declined wheelchair at time of discharge.

## 2015-08-04 NOTE — ED Notes (Addendum)
EMT was sitting at nurse first when patient came up in an aggressive manor asking why he was sitting out in the waiting room, patient states he is in extreme pain and states that he could possibly die out here due to his waiting and he states" that all these other people are going in front of him'. Patient was triaged and seen seen by triage staff and was sent back out, with EKG and blood work done. Patient was waiting roughly 35 min and was next to go back to a room. Patient was very angry at this time and states for [place] to call his daughter and he wants to go to [place] long. I stated to the patient that he was next to go back and that it was not a good idea to leave and go to [place] with active chest pain. Patient denies wanting to go back to a room and went back to sit in wheelchair. Called daughter but unable to get in contact with her

## 2015-08-04 NOTE — Discharge Instructions (Signed)
Nonspecific Chest Pain  Chest pain can be caused by many different conditions. There is always a chance that your pain could be related to something serious, such as a heart attack or a blood clot in your lungs. Chest pain can also be caused by conditions that are not life-threatening. If you have chest pain, it is very important to follow up with your health care provider. CAUSES  Chest pain can be caused by:  Heartburn.  Pneumonia or bronchitis.  Anxiety or stress.  Inflammation around your heart (pericarditis) or lung (pleuritis or pleurisy).  A blood clot in your lung.  A collapsed lung (pneumothorax). It can develop suddenly on its own (spontaneous pneumothorax) or from trauma to the chest.  Shingles infection (varicella-zoster virus).  Heart attack.  Damage to the bones, muscles, and cartilage that make up your chest wall. This can include:  Bruised bones due to injury.  Strained muscles or cartilage due to frequent or repeated coughing or overwork.  Fracture to one or more ribs.  Sore cartilage due to inflammation (costochondritis). RISK FACTORS  Risk factors for chest pain may include:  Activities that increase your risk for trauma or injury to your chest.  Respiratory infections or conditions that cause frequent coughing.  Medical conditions or overeating that can cause heartburn.  Heart disease or family history of heart disease.  Conditions or health behaviors that increase your risk of developing a blood clot.  Having had chicken pox (varicella zoster). SIGNS AND SYMPTOMS Chest pain can feel like:  Burning or tingling on the surface of your chest or deep in your chest.  Crushing, pressure, aching, or squeezing pain.  Dull or sharp pain that is worse when you move, cough, or take a deep breath.  Pain that is also felt in your back, neck, shoulder, or arm, or pain that spreads to any of these areas. Your chest pain may come and go, or it may stay  constant. DIAGNOSIS Lab tests or other studies may be needed to find the cause of your pain. Your health care provider may have you take a test called an ambulatory ECG (electrocardiogram). An ECG records your heartbeat patterns at the time the test is performed. You may also have other tests, such as:  Transthoracic echocardiogram (TTE). During echocardiography, sound waves are used to create a picture of all of the heart structures and to look at how blood flows through your heart.  Transesophageal echocardiogram (TEE).This is a more advanced imaging test that obtains images from inside your body. It allows your health care provider to see your heart in finer detail.  Cardiac monitoring. This allows your health care provider to monitor your heart rate and rhythm in real time.  Holter monitor. This is a portable device that records your heartbeat and can help to diagnose abnormal heartbeats. It allows your health care provider to track your heart activity for several days, if needed.  Stress tests. These can be done through exercise or by taking medicine that makes your heart beat more quickly.  Blood tests.  Imaging tests. TREATMENT  Your treatment depends on what is causing your chest pain. Treatment may include:  Medicines. These may include:  Acid blockers for heartburn.  Anti-inflammatory medicine.  Pain medicine for inflammatory conditions.  Antibiotic medicine, if an infection is present.  Medicines to dissolve blood clots.  Medicines to treat coronary artery disease.  Supportive care for conditions that do not require medicines. This may include:  Resting.  Applying heat  or cold packs to injured areas.  Limiting activities until pain decreases. HOME CARE INSTRUCTIONS  If you were prescribed an antibiotic medicine, finish it all even if you start to feel better.  Avoid any activities that bring on chest pain.  Do not use any tobacco products, including  cigarettes, chewing tobacco, or electronic cigarettes. If you need help quitting, ask your health care provider.  Do not drink alcohol.  Take medicines only as directed by your health care provider.  Keep all follow-up visits as directed by your health care provider. This is important. This includes any further testing if your chest pain does not go away.  If heartburn is the cause for your chest pain, you may be told to keep your head raised (elevated) while sleeping. This reduces the chance that acid will go from your stomach into your esophagus.  Make lifestyle changes as directed by your health care provider. These may include:  Getting regular exercise. Ask your health care provider to suggest some activities that are safe for you.  Eating a heart-healthy diet. A registered dietitian can help you to learn healthy eating options.  Maintaining a healthy weight.  Managing diabetes, if necessary.  Reducing stress. SEEK MEDICAL CARE IF:  Your chest pain does not go away after treatment.  You have a rash with blisters on your chest.  You have a fever. SEEK IMMEDIATE MEDICAL CARE IF:   Your chest pain is worse.  You have an increasing cough, or you cough up blood.  You have severe abdominal pain.  You have severe weakness.  You faint.  You have chills.  You have sudden, unexplained chest discomfort.  You have sudden, unexplained discomfort in your arms, back, neck, or jaw.  You have shortness of breath at any time.  You suddenly start to sweat, or your skin gets clammy.  You feel nauseous or you vomit.  You suddenly feel light-headed or dizzy.  Your heart begins to beat quickly, or it feels like it is skipping beats. These symptoms may represent a serious problem that is an emergency. Do not wait to see if the symptoms will go away. Get medical help right away. Call your local emergency services (911 in the [place] Do not drive yourself to the hospital.   This  information is not intended to replace advice given to you by your health care provider. Make sure you discuss any questions you have with your health care provider.   Document Released: 07/16/2005 Document Revised: 10/27/2014 Document Reviewed: 05/12/2014 Elsevier Interactive Patient Education 2016 [employer].  Stimulant Use Disorder-Cocaine Cocaine is one of a group of powerful drugs called stimulants. Cocaine has medical uses for stopping nosebleeds and for pain control before minor nose or dental surgery. However, cocaine is misused because of the effects that it produces. These effects include:   A feeling of extreme pleasure.  Alertness.  High energy. Common street names for cocaine include coke, crack, blow, snow, and nose candy. Cocaine is snorted, dissolved in water and injected, or smoked.  Stimulants are addictive because they activate regions of the brain that produce both the pleasurable sensation of "reward" and psychological dependence. Together, these actions account for loss of control and the rapid development of drug dependence. This means you become ill without the drug (withdrawal) and need to keep using it to function.  Stimulant use disorder is use of stimulants that disrupts your daily life. It disrupts relationships with family and friends and how you do your  job. Cocaine increases your blood pressure and heart rate. It can cause a heart attack or stroke. Cocaine can also cause death from irregular heart rate or seizures. SYMPTOMS Symptoms of stimulant use disorder with cocaine include:  Use of cocaine in larger amounts or over a longer period of time than intended.  Unsuccessful attempts to cut down or control cocaine use.  A lot of time spent obtaining, using, or recovering from the effects of cocaine.  A strong desire or urge to use cocaine (craving).  Continued use of cocaine in spite of major problems at work, school, or home because of use.  Continued  use of cocaine in spite of relationship problems because of use.  Giving up or cutting down on important life activities because of cocaine use.  Use of cocaine over and over in situations when it is physically hazardous, such as driving a car.  Continued use of cocaine in spite of a physical problem that is likely related to use. Physical problems can include:  Malnutrition.  Nosebleeds.  Chest pain.  High blood pressure.  A hole that develops between the part of your nose that separates your nostrils (perforated nasal septum).  Lung and kidney damage.  Continued use of cocaine in spite of a mental problem that is likely related to use. Mental problems can include:  Schizophrenia-like symptoms.  Depression.  Bipolar mood swings.  Anxiety.  Sleep problems.  Need to use more and more cocaine to get the same effect, or lessened effect over time with use of the same amount of cocaine (tolerance).  Having withdrawal symptoms when cocaine use is stopped, or using cocaine to reduce or avoid withdrawal symptoms. Withdrawal symptoms include:  Depressed or irritable mood.  Low energy or restlessness.  Bad dreams.  Poor or excessive sleep.  Increased appetite. DIAGNOSIS Stimulant use disorder is diagnosed by your health care provider. You may be asked questions about your cocaine use and how it affects your life. A physical exam may be done. A drug screen may be ordered. You may be referred to a mental health professional. The diagnosis of stimulant use disorder requires at least two symptoms within 12 months. The type of stimulant use disorder depends on the number of signs and symptoms you have. The type may be:  Mild. Two or three signs and symptoms.  Moderate. Four or five signs and symptoms.  Severe. Six or more signs and symptoms. TREATMENT Treatment for stimulant use disorder is usually provided by mental health professionals with training in substance use  disorders. The following options are available:  Counseling or talk therapy. Talk therapy addresses the reasons you use cocaine and ways to keep you from using again. Goals of talk therapy include:  Identifying and avoiding triggers for use.  Handling cravings.  Replacing use with healthy activities.  Support groups. Support groups provide emotional support, advice, and guidance.  Medicine. Certain medicines may decrease cocaine cravings or withdrawal symptoms. HOME CARE INSTRUCTIONS  Take medicines only as directed by your health care provider.  Identify the people and activities that trigger your cocaine use and avoid them.  Keep all follow-up visits as directed by your health care provider. SEEK MEDICAL CARE IF:  Your symptoms get worse or you relapse.  You are not able to take medicines as directed. SEEK IMMEDIATE MEDICAL CARE IF:  You have serious thoughts about hurting yourself or others.  You have a seizure, chest pain, sudden weakness, or loss of speech or vision. FOR MORE  [place] on Drug Abuse: motorcyclefax.com  Substance Abuse and Mental Health Services Administration: ktimeonline.com   This information is not intended to replace advice given to you by your health care provider. Make sure you discuss any questions you have with your health care provider.   Document Released: 10/03/2000 Document Revised: 10/27/2014 Document Reviewed: 10/19/2013 Elsevier Interactive Patient Education [employer].

## 2015-08-04 NOTE — ED Notes (Signed)
Pt riding around in a wheelchair in the waiting room  And being loud .

## 2015-08-13 ENCOUNTER — Telehealth: Payer: Self-pay | Admitting: Family Medicine

## 2015-08-13 DIAGNOSIS — F1911 Other psychoactive substance abuse, in remission: Secondary | ICD-10-CM

## 2015-08-13 DIAGNOSIS — M545 Low back pain, unspecified: Secondary | ICD-10-CM

## 2015-08-13 DIAGNOSIS — M79605 Pain in left leg: Secondary | ICD-10-CM

## 2015-08-13 NOTE — Telephone Encounter (Signed)
Paul Clayton care manager from the [facility] and [place] is asking that a referral be placed for him to receive care with a Pain Management facility.  Patient c/o of being in pain all the time.

## 2015-08-13 NOTE — Telephone Encounter (Signed)
Will forward to PCP for review to see if pt needs to be seen in clinic first or if referral for pain management can be placed without OV. Mclane Arora,CMA.

## 2015-08-14 NOTE — Telephone Encounter (Signed)
Referred to pain clinic at patients request

## 2015-08-15 ENCOUNTER — Inpatient Hospital Stay (HOSPITAL_COMMUNITY): Admission: RE | Admit: 2015-08-15 | Payer: Medicare Other | Source: Ambulatory Visit

## 2015-08-17 DIAGNOSIS — M79605 Pain in left leg: Secondary | ICD-10-CM | POA: Diagnosis not present

## 2015-08-17 DIAGNOSIS — R52 Pain, unspecified: Secondary | ICD-10-CM | POA: Diagnosis not present

## 2015-08-18 ENCOUNTER — Encounter (HOSPITAL_COMMUNITY): Payer: Self-pay | Admitting: Emergency Medicine

## 2015-08-18 ENCOUNTER — Other Ambulatory Visit: Payer: Self-pay

## 2015-08-18 ENCOUNTER — Emergency Department (HOSPITAL_COMMUNITY): Payer: Medicare Other

## 2015-08-18 ENCOUNTER — Emergency Department (HOSPITAL_COMMUNITY)
Admission: EM | Admit: 2015-08-18 | Discharge: 2015-08-18 | Disposition: A | Discharge status: Home / Self Care | Payer: Medicare Other | Attending: Emergency Medicine | Admitting: Emergency Medicine

## 2015-08-18 DIAGNOSIS — Z9889 Other specified postprocedural states: Secondary | ICD-10-CM | POA: Diagnosis not present

## 2015-08-18 DIAGNOSIS — G8929 Other chronic pain: Secondary | ICD-10-CM | POA: Insufficient documentation

## 2015-08-18 DIAGNOSIS — K219 Gastro-esophageal reflux disease without esophagitis: Secondary | ICD-10-CM | POA: Diagnosis not present

## 2015-08-18 DIAGNOSIS — I5022 Chronic systolic (congestive) heart failure: Secondary | ICD-10-CM | POA: Insufficient documentation

## 2015-08-18 DIAGNOSIS — I10 Essential (primary) hypertension: Secondary | ICD-10-CM | POA: Diagnosis not present

## 2015-08-18 DIAGNOSIS — Z85038 Personal history of other malignant neoplasm of large intestine: Secondary | ICD-10-CM | POA: Diagnosis not present

## 2015-08-18 DIAGNOSIS — G43909 Migraine, unspecified, not intractable, without status migrainosus: Secondary | ICD-10-CM | POA: Diagnosis not present

## 2015-08-18 DIAGNOSIS — I2510 Atherosclerotic heart disease of native coronary artery without angina pectoris: Secondary | ICD-10-CM | POA: Insufficient documentation

## 2015-08-18 DIAGNOSIS — Z7901 Long term (current) use of anticoagulants: Secondary | ICD-10-CM | POA: Diagnosis not present

## 2015-08-18 DIAGNOSIS — M549 Dorsalgia, unspecified: Secondary | ICD-10-CM | POA: Diagnosis not present

## 2015-08-18 DIAGNOSIS — Z8619 Personal history of other infectious and parasitic diseases: Secondary | ICD-10-CM | POA: Diagnosis not present

## 2015-08-18 DIAGNOSIS — I4891 Unspecified atrial fibrillation: Secondary | ICD-10-CM | POA: Diagnosis not present

## 2015-08-18 DIAGNOSIS — Z79899 Other long term (current) drug therapy: Secondary | ICD-10-CM | POA: Insufficient documentation

## 2015-08-18 DIAGNOSIS — Z720 Tobacco use: Secondary | ICD-10-CM | POA: Insufficient documentation

## 2015-08-18 DIAGNOSIS — M545 Low back pain: Secondary | ICD-10-CM | POA: Diagnosis not present

## 2015-08-18 DIAGNOSIS — J449 Chronic obstructive pulmonary disease, unspecified: Secondary | ICD-10-CM | POA: Insufficient documentation

## 2015-08-18 DIAGNOSIS — Z8659 Personal history of other mental and behavioral disorders: Secondary | ICD-10-CM | POA: Insufficient documentation

## 2015-08-18 LAB — CBC WITH DIFFERENTIAL/PLATELET
Basophils Absolute: 0 10*3/uL (ref 0.0–0.1)
Basophils Relative: 0 %
EOS PCT: 2 %
Eosinophils Absolute: 0.1 10*3/uL (ref 0.0–0.7)
HCT: 39.2 % (ref 39.0–52.0)
Hemoglobin: 13.7 g/dL (ref 13.0–17.0)
LYMPHS ABS: 2.4 10*3/uL (ref 0.7–4.0)
LYMPHS PCT: 28 %
MCH: 33.5 pg (ref 26.0–34.0)
MCHC: 34.9 g/dL (ref 30.0–36.0)
MCV: 95.8 fL (ref 78.0–100.0)
MONO ABS: 0.7 10*3/uL (ref 0.1–1.0)
Monocytes Relative: 8 %
Neutro Abs: 5.4 10*3/uL (ref 1.7–7.7)
Neutrophils Relative %: 62 %
Platelets: 175 10*3/uL (ref 150–400)
RBC: 4.09 MIL/uL — ABNORMAL LOW (ref 4.22–5.81)
RDW: 12 % (ref 11.5–15.5)
WBC: 8.6 10*3/uL (ref 4.0–10.5)

## 2015-08-18 LAB — BASIC METABOLIC PANEL
Anion gap: 8 (ref 5–15)
BUN: 11 mg/dL (ref 6–20)
CHLORIDE: 106 mmol/L (ref 101–111)
CO2: 23 mmol/L (ref 22–32)
Calcium: 8.9 mg/dL (ref 8.9–10.3)
Creatinine, Ser: 0.85 mg/dL (ref 0.61–1.24)
GFR calc Af Amer: >60 mL/min (ref >60)
GFR calc non Af Amer: >60 mL/min (ref >60)
GLUCOSE: 80 mg/dL (ref 65–99)
POTASSIUM: 3.8 mmol/L (ref 3.5–5.1)
Sodium: 137 mmol/L (ref 135–145)

## 2015-08-18 MED ORDER — ACETAMINOPHEN 500 MG PO TABS: 500.0000 mg | ORAL_TABLET | Freq: Four times a day (QID) | ORAL | Status: DC | PRN

## 2015-08-18 MED ORDER — GADOBENATE DIMEGLUMINE 529 MG/ML IV SOLN
20.0000 mL | Freq: Once | INTRAVENOUS | Status: AC | PRN
Start: 2015-08-18 — End: 2015-08-18
  Administered 2015-08-18: 20 mL via INTRAVENOUS

## 2015-08-18 MED ORDER — OXYCODONE-ACETAMINOPHEN 5-325 MG PO TABS
2.0000 | ORAL_TABLET | Freq: Once | ORAL | Status: AC
  Administered 2015-08-18: 2 via ORAL
  Filled 2015-08-18: qty 2

## 2015-08-18 NOTE — ED Notes (Signed)
Bus pass given 

## 2015-08-18 NOTE — Discharge Instructions (Signed)
1. Medications: tylenol for pain, usual home medications 2. Treatment: rest, drink plenty of fluids 3. Follow Up: please followup with your primary doctor and with neurosurgery for discussion of your diagnoses and further evaluation after today's visit; if you do not have a primary care doctor use the resource guide provided to find one; please return to the ER for fever, new numbness or weakness, loss of control of your bowel or bladder   Back Exercises The following exercises strengthen the muscles that help to support the back. They also help to keep the lower back flexible. Doing these exercises can help to prevent back pain or lessen existing pain. If you have back pain or discomfort, try doing these exercises 2-3 times each day or as told by your health care provider. When the pain goes away, do them once each day, but increase the number of times that you repeat the steps for each exercise (do more repetitions). If you do not have back pain or discomfort, do these exercises once each day or as told by your health care provider. EXERCISES Single Knee to Chest Repeat these steps 3-5 times for each leg:  Lie on your back on a firm bed or the floor with your legs extended.  Bring one knee to your chest. Your other leg should stay extended and in contact with the floor.  Hold your knee in place by grabbing your knee or thigh.  Pull on your knee until you feel a gentle stretch in your lower back.  Hold the stretch for 10-30 seconds.  Slowly release and straighten your leg. Pelvic Tilt Repeat these steps 5-10 times:  Lie on your back on a firm bed or the floor with your legs extended.  Bend your knees so they are pointing toward the ceiling and your feet are flat on the floor.  Tighten your lower abdominal muscles to press your lower back against the floor. This motion will tilt your pelvis so your tailbone points up toward the ceiling instead of pointing to your feet or the  floor.  With gentle tension and even breathing, hold this position for 5-10 seconds. Cat-Cow Repeat these steps until your lower back becomes more flexible:  Get into a hands-and-knees position on a firm surface. Keep your hands under your shoulders, and keep your knees under your hips. You may place padding under your knees for comfort.  Let your head hang down, and point your tailbone toward the floor so your lower back becomes rounded like the back of a cat.  Hold this position for 5 seconds.  Slowly lift your head and point your tailbone up toward the ceiling so your back forms a sagging arch like the back of a cow.  Hold this position for 5 seconds. Press-Ups Repeat these steps 5-10 times:  Lie on your abdomen (face-down) on the floor.  Place your palms near your head, about shoulder-width apart.  While you keep your back as relaxed as possible and keep your hips on the floor, slowly straighten your arms to raise the top half of your body and lift your shoulders. Do not use your back muscles to raise your upper torso. You may adjust the placement of your hands to make yourself more comfortable.  Hold this position for 5 seconds while you keep your back relaxed.  Slowly return to lying flat on the floor. Bridges Repeat these steps 10 times: 1. Lie on your back on a firm surface. 2. Bend your knees so they  are pointing toward the ceiling and your feet are flat on the floor. 3. Tighten your buttocks muscles and lift your buttocks off of the floor until your waist is at almost the same height as your knees. You should feel the muscles working in your buttocks and the back of your thighs. If you do not feel these muscles, slide your feet 1-2 inches farther away from your buttocks. 4. Hold this position for 3-5 seconds. 5. Slowly lower your hips to the starting position, and allow your buttocks muscles to relax completely. If this exercise is too easy, try doing it with your arms  crossed over your chest. Abdominal Crunches Repeat these steps 5-10 times: 1. Lie on your back on a firm bed or the floor with your legs extended. 2. Bend your knees so they are pointing toward the ceiling and your feet are flat on the floor. 3. Cross your arms over your chest. 4. Tip your chin slightly toward your chest without bending your neck. 5. Tighten your abdominal muscles and slowly raise your trunk (torso) high enough to lift your shoulder blades a tiny bit off of the floor. Avoid raising your torso higher than that, because it can put too much stress on your low back and it does not help to strengthen your abdominal muscles. 6. Slowly return to your starting position. Back Lifts Repeat these steps 5-10 times: 1. Lie on your abdomen (face-down) with your arms at your sides, and rest your forehead on the floor. 2. Tighten the muscles in your legs and your buttocks. 3. Slowly lift your chest off of the floor while you keep your hips pressed to the floor. Keep the back of your head in line with the curve in your back. Your eyes should be looking at the floor. 4. Hold this position for 3-5 seconds. 5. Slowly return to your starting position. SEEK MEDICAL CARE IF:  Your back pain or discomfort gets much worse when you do an exercise.  Your back pain or discomfort does not lessen within 2 hours after you exercise. If you have any of these problems, stop doing these exercises right away. Do not do them again unless your health care provider says that you can. SEEK IMMEDIATE MEDICAL CARE IF:  You develop sudden, severe back pain. If this happens, stop doing the exercises right away. Do not do them again unless your health care provider says that you can.   This information is not intended to replace advice given to you by your health care provider. Make sure you discuss any questions you have with your health care provider.   Document Released: 11/13/2004 Document Revised: 06/27/2015  Document Reviewed: 11/30/2014 Elsevier Interactive Patient Education 2016 Elsevier Inc.  Back Pain, Adult Back pain is very common in adults.The cause of back pain is rarely dangerous and the pain often gets better over time.The cause of your back pain may not be known. Some common causes of back pain include:  Strain of the muscles or ligaments supporting the spine.  Wear and tear (degeneration) of the spinal disks.  Arthritis.  Direct injury to the back. For many people, back pain may return. Since back pain is rarely dangerous, most people can learn to manage this condition on their own. HOME CARE INSTRUCTIONS Watch your back pain for any changes. The following actions may help to lessen any discomfort you are feeling:  Remain active. It is stressful on your back to sit or stand in one place for long  periods of time. Do not sit, drive, or stand in one place for more than 30 minutes at a time. Take short walks on even surfaces as soon as you are able.Try to increase the length of time you walk each day.  Exercise regularly as directed by your health care provider. Exercise helps your back heal faster. It also helps avoid future injury by keeping your muscles strong and flexible.  Do not stay in bed.Resting more than 1-2 days can delay your recovery.  Pay attention to your body when you bend and lift. The most comfortable positions are those that put less stress on your recovering back. Always use proper lifting techniques, including:  Bending your knees.  Keeping the load close to your body.  Avoiding twisting.  Find a comfortable position to sleep. Use a firm mattress and lie on your side with your knees slightly bent. If you lie on your back, put a pillow under your knees.  Avoid feeling anxious or stressed.Stress increases muscle tension and can worsen back pain.It is important to recognize when you are anxious or stressed and learn ways to manage it, such as with  exercise.  Take medicines only as directed by your health care provider. Over-the-counter medicines to reduce pain and inflammation are often the most helpful.Your health care provider may prescribe muscle relaxant drugs.These medicines help dull your pain so you can more quickly return to your normal activities and healthy exercise.  Apply ice to the injured area:  Put ice in a plastic bag.  Place a towel between your skin and the bag.  Leave the ice on for 20 minutes, 2-3 times a day for the first 2-3 days. After that, ice and heat may be alternated to reduce pain and spasms.  Maintain a healthy weight. Excess weight puts extra stress on your back and makes it difficult to maintain good posture. SEEK MEDICAL CARE IF:  You have pain that is not relieved with rest or medicine.  You have increasing pain going down into the legs or buttocks.  You have pain that does not improve in one week.  You have night pain.  You lose weight.  You have a fever or chills. SEEK IMMEDIATE MEDICAL CARE IF:   You develop new bowel or bladder control problems.  You have unusual weakness or numbness in your arms or legs.  You develop nausea or vomiting.  You develop abdominal pain.  You feel faint.   This information is not intended to replace advice given to you by your health care provider. Make sure you discuss any questions you have with your health care provider.   Document Released: 10/06/2005 Document Revised: 10/27/2014 Document Reviewed: 02/07/2014 Elsevier Interactive Patient Education 2016 [employer].   Emergency Department Resource Guide 1) Find a Doctor and Pay Out of Pocket Although you won't have to find out who is covered by your insurance plan, it is a good idea to ask around and get recommendations. You will then need to call the office and see if the doctor you have chosen will accept you as a new patient and what types of options they offer for patients who are  self-pay. Some doctors offer discounts or will set up payment plans for their patients who do not have insurance, but you will need to ask so you aren't surprised when you get to your appointment.  2) Contact Your Local Health Department Not all health departments have doctors that can see patients for sick visits, but  many do, so it is worth a call to see if yours does. If you don't know where your local health department is, you can check in your phone book. The CDC also has a tool to help you locate your state's health department, and many state websites also have listings of all of their local health departments.  3) Find a [facility] If your illness is not likely to be very severe or complicated, you may want to try a walk in clinic. These are popping up all over the country in pharmacies, drugstores, and shopping centers. They're usually staffed by nurse practitioners or physician assistants that have been trained to treat common illnesses and complaints. They're usually fairly quick and inexpensive. However, if you have serious medical issues or chronic medical problems, these are probably not your best option.  No Primary Care Doctor: - Call Health Connect at  [phone] - they can help you locate a primary care doctor that  accepts your insurance, provides certain services, etc. - Physician Referral Service- [phone]  Chronic Pain Problems: Organization         Address  Phone   Notes  [facility]  [phone] Patients need to be referred by their primary care doctor.   Medication Assistance: Organization         Address  Phone   Notes  [facility] Medication [facility] [address] [place], [place] [address] [phone] --Must be a resident of [facility] -- Must have NO insurance coverage whatsoever (no Medicaid/ Medicare, etc.) -- The pt. MUST have a primary care doctor that directs their care regularly and follows them in  the community   Med[facility]ist  [phone]   [employer]  [phone]    Agencies that provide inexpensive medical care: Organization         Address  Phone   Notes  [place]  [phone]   [facility]    [phone]   [facility] [place], [place] [address] [phone]   [place] [address], [place] [phone]   Planned Parenthood    [phone]   [facility]    [phone]   [place] and [place] [address], [place] Phone:  [phone], Fax:  [phone] Hours of Operation:  9 am - 6 pm, M-F.  Also accepts Medicaid/Medicare and self-pay.  [facility] for [place], [address], [place] Phone: [phone], Fax: [phone]. Hours of Operation:  8:30 am - 5:30 pm, M-F.  Also accepts Medicaid and self-pay.  [facility] [place] [address] [place] Phone: [phone]   [place], [place], [place] [phone], Ext. [phone] Mondays & Thursdays: 7-9 AM.  First 15 patients are seen on a first come, first serve basis.    [place] Providers:  Organization         Address  Phone   Notes  [facility] [address], [place] [phone] Also accepts self-pay patients.  [place], [place]  [phone]   [place], [address], [place] [phone]   [place] [address] [place] [phone]   Lucianne Lei  [address], [place]   [phone] Only accepts [place] patients after they have their name applied to their card.   Self-Pay (no insurance) in [facility]:  Organization         Address  Phone   Notes  Sickle Cell Patients,  [facility] [place] [phone]   [place] [facility] [place] [phone]   [facility] [place]  [place], [place], [place] [phone]   [facility]/Dr. Osei-Bonsu  [address], [place] or [address], [place] [phone] Phone number for both [place] and [place] locations is the same.  [facility] and [facility] [address] [place] [phone]   [facility] [address] [place] or [address] [phone]   [facility] [address] [place] [phone], phone; [phone], fax Sees patients 1st and 3rd Saturday of every month.  Must not qualify for public or private insurance (i.e. Medicaid, Medicare, [place] Health Choice, Veterans' Benefits)  Household income should be no more than 200% of the poverty level The clinic cannot treat you if you are pregnant or think you are pregnant  Sexually transmitted diseases are not treated at the clinic.    Dental Care: Organization         Address  Phone  Notes  [facility] Department of [facility] [place] [phone] Accepts children up to age 54 who are enrolled in [place] or [place]; pregnant women with a Medicaid card; and children who have applied for Medicaid or [place] Health Choice, but were declined, whose parents can pay a reduced fee at time of service.  [facility] Department of [facility]  [address], [place] [phone] Accepts children up to age 55 who are enrolled in [place] or [place]; pregnant women with a Medicaid card; and children who have applied for Medicaid or [place] Health Choice, but were declined, whose parents can pay a reduced fee at time of service.  [place] Adult Dental Access PROGRAM   [place] [phone] Patients are seen by appointment only. Walk-ins are not accepted. [place] will see patients 32 years of age and older. Monday - Tuesday (8am-5pm) Most Wednesdays (8:30-5pm) $30 per visit, cash only  [facility] Adult Dental Access PROGRAM  [address], [facility] [phone] Patients are seen by appointment only. Walk-ins are not accepted. [place] will see patients 71 years of age and older. One Wednesday Evening (Monthly: Volunteer Based).  $30 per visit, cash only  [place]  [phone] for adults; Children under age 36, call Graduate Pediatric Dentistry at [phone]. Children aged 75-14, please call [phone] to request a pediatric application.  Dental services are provided in all areas of dental care including fillings, crowns and bridges, complete and partial dentures, implants, gum treatment, root canals, and extractions. Preventive care is also provided. Treatment is provided to both adults and children. Patients are selected via a lottery and there is often a waiting list.   [facility] [address] [place]  [phone] [email or web address], [place], [place] [phone], Ext. [phone] Second  and Fourth Thursday of each month, opens at 6:30 AM; Clinic ends at 9 AM.  Patients are seen on a first-come first-served basis, and a limited number are seen during each clinic.   [facility]  [address] Hillard Danker [place], [place] [phone]   Eligibility Requirements You must have lived in [place], [place], or [place] counties for at least the last three months.   You cannot be eligible for state or federal sponsored [employer], including [employer], [place], or [employer].   You generally cannot be eligible for healthcare insurance through your employer.    How to apply: Eligibility screenings are  held every Tuesday and Wednesday afternoon from 1:00 pm until 4:00 pm. You do not need an appointment for the interview!  [facility] [address] [place], [place]   [place]  [place] Department  [place]  [phone]    Behavioral Health Resources in the Community: Intensive Outpatient Programs Organization         Address  Phone  Notes  [place]. [address], [place], [place] [phone]   [facility] Outpatient [address] [place], [place]   ADS: Alcohol & Drug Svcs [address] [place], [place]   [place] [address],  [place], [place] or [phone]   Substance Abuse Resources Organization         Address  Phone  Notes  Alcohol and Drug Services  [phone]   [place]  [phone]   [facility]   Chinita Pester  [phone]   Residential & Outpatient Substance Abuse Program  [phone]   Psychological Services Organization         Address  Phone  Notes  [facility] [place]  [place]  [phone]   [place] [address] [place] or [phone]    Mobile Crisis Teams Organization         Address  Phone  Notes  [facility], Mobile Crisis Care Unit  [phone]   [facility]  [address] [place], [place]   [place] [address] [place] [phone]    Self-Help/Support Groups Organization         Address  Phone             Notes  [place]. of [place] - variety of support groups  [place] Call for more information  Narcotics Anonymous (NA), [facility] [address], [employer] [place]  2 meetings at this location    Special educational needs teacher         Address  Phone  Notes  [facility] Residential Treatment [place],    [place]  [phone]   [facility]  [address], [place] [phone], [place], [place]   [place], [place] [phone] Admissions: 8am-3pm M-F  Incentives Substance [place] [address],    [place], [place] [phone]   [facility] [address] Jadene Pierini [place], [place]   The [place]  [place], [place] - Intensive Outpatient [address] Kristeen [address], [place], [place]   [facility] ([place] [phone]  [employer]  [place], [place] [phone] or [phone]   [facility] ([facility]) [address], [place], [place] Accepts Medicaid  [facility] [place] [address],  [place] [phone] Substance Abuse/Addiction Treatment   [facility] Organization         Address  Phone  Notes  [facility]  [phone]   Domenic Schwab, PhD [address] [place], [place]   [phone] or [phone]   [place]   [address] [place], [place] [phone]   [facility] [address] [place], [place] [phone] Insurance/Medicaid/sponsorship through [facility] and Families [address], [address]                                    [place], [place] [phone] [place] [address], [place] [phone]    Dr. Adele Schilder  [phone]   [facility] Dept. 1) [address] [place] 2) [place] 3)  [place] [address], [place] [phone]  [phone]   [place] [phone] or [phone] (After  Hours)

## 2015-08-18 NOTE — ED Notes (Signed)
Patient transported to MRI 

## 2015-08-18 NOTE — ED Provider Notes (Signed)
CSN: [id number]     Arrival date & time 08/18/15  0021 History   First MD Initiated Contact with Patient 08/18/15 0105     Chief Complaint  Patient presents with  . Back Pain     HPI   Paul Clayton is a 69 y.o. male with a PMH of atrial fibrillation, CHF, CAD, HTN, COPD who presents to the ED with left-sided low back pain radiating to his left lower extremity. He states he has experienced these symptoms since February, but reports progressive worsening over the last several days. He states today, he felt like his back "gave out on him." He reports movement exacerbates his pain. He has tried his home pain medication without significant symptom relief. He denies recent injury, bowel or bladder incontinence, saddle anesthesia. Patient reports he has a history of colon cancer, IV drug use, and is currently taking xarelto for anticoagulation. He denies fever, chills, chest pain, shortness of breath, abdominal pain, nausea, vomiting, diarrhea, constipation, dysuria, urgency   Past Medical History  Diagnosis Date  . Persistent atrial fibrillation (HCC)     a. s/p DCCV; b. Amiodarone  . Hypertension   . Chronic systolic CHF (congestive heart failure) (HCC)     a. EF previously 15%; b. Echo 7/16:  EF 30-35%, diff HK, gr 1 DD, mild MR, mild LAE, mild reduced RVSF  . NICM (nonischemic cardiomyopathy) ([place])     no obs CAD on LHC in 1/16 - ? HTN or substance abuse  . CAD (coronary artery disease)     a. LHC 1/16:  LM 20%, oLAD 30%, oLCx 30%, pRCA 30%  . Adenocarcinoma of colon ([place])     STAGE 1  . Substance abuse     ETOH; crack cocaine  . COPD (chronic obstructive pulmonary disease) ([place])   . Tobacco abuse   . GERD (gastroesophageal reflux disease)   . Hepatitis C   . Migraine     "last one was back in the 1980's" (04/19/2015)  . Arthritis     "left hip" (04/19/2015)  . Chronic lower back pain   . Anxiety   . Depression    Past Surgical History  Procedure Laterality Date  .  Cardioversion      "I've had 2 in all" (04/19/2015)  . Colonoscopy    . Left and right heart catheterization with coronary angiogram N/A 10/23/2014    Procedure: LEFT AND RIGHT HEART CATHETERIZATION WITH CORONARY ANGIOGRAM;  Surgeon: Larey Dresser, MD;  Location[facility] CATH LAB;  Service: Cardiovascular;  Laterality: N/A;  . Colonoscopy with propofol N/A 11/30/2014    Procedure: COLONOSCOPY WITH PROPOFOL;  Surgeon: Milus Banister, MD;  Location: [facility] ENDOSCOPY;  Service: Endoscopy;  Laterality: N/A;  . Cardioversion N/A 01/30/2015    Procedure: CARDIOVERSION;  Surgeon: Larey Dresser, MD;  Location: [facility] ENDOSCOPY;  Service: Cardiovascular;  Laterality: N/A;  . Cardiac catheterization  10/23/2014   Family History  Problem Relation Age of Onset  . Hypertension Mother    Social History  Substance Use Topics  . Smoking status: Light Tobacco Smoker -- 0.10 packs/day for 52 years    Types: Cigarettes    Last Attempt to Quit: 09/01/2014  . Smokeless tobacco: Never Used     Comment: 04/19/2015 "probably smoke 1 pack cigarettes/month"  . Alcohol Use: 0.0 oz/week    0 Standard drinks or equivalent per week     Comment: 04/19/2015 "I might drink a 40oz beer/month"      Review  of Systems  Constitutional: Negative for fever and chills.  Respiratory: Negative for shortness of breath.   Cardiovascular: Negative for chest pain.  Gastrointestinal: Negative for nausea, vomiting, abdominal pain, diarrhea and constipation.  Genitourinary: Negative for dysuria, urgency and frequency.  Musculoskeletal: Positive for myalgias, back pain and gait problem. Negative for neck pain and neck stiffness.  Skin: Negative for color change, pallor, rash and wound.  Neurological: Negative for dizziness, syncope, weakness, light-headedness, numbness and headaches.  All other systems reviewed and are negative.     Allergies  Review of patient's allergies indicates no known allergies.  Home Medications   Prior to  Admission medications   Medication Sig Start Date End Date Taking? Authorizing Provider  amiodarone (PACERONE) 200 MG tablet Take 1 tablet (200 mg total) by mouth daily. 08/02/15  Yes Larey Dresser, MD  atorvastatin (LIPITOR) 20 MG tablet Take 1 tablet (20 mg total) by mouth daily. 08/02/15  Yes Larey Dresser, MD  baclofen (LIORESAL) 10 MG tablet Take 10 mg by mouth daily as needed for muscle spasms (pain).   Yes Historical Provider, MD  carvedilol (COREG) 3.125 MG tablet TAKE 1 TABLET (3.125 MG TOTAL) BY MOUTH TWO   (TWO) TIMES DAILY WITH A MEAL. Patient taking differently: TAKE 1 TABLET (3.125 MG TOTAL) BY MOUTH DAILY 06/13/15  Yes Larey Dresser, MD  furosemide (LASIX) 40 MG tablet TAKE 1 TABLET BY MOUTH   DAILY 05/16/15  Yes Larey Dresser, MD  gabapentin (NEURONTIN) 300 MG capsule Take 1 capsule (300 mg total) by mouth 3 (three) times daily. Patient taking differently: Take 300 mg by mouth daily.  07/09/15  Yes Olam Idler, MD  losartan (COZAAR) 25 MG tablet Take 1 tablet (25 mg total) by mouth daily. 08/02/15  Yes Larey Dresser, MD  mometasone-formoterol [facility]) 200-5 MCG/ACT AERO Inhale 2 puffs into the lungs 2 (two) times daily as needed for wheezing. Patient taking differently: Inhale 2 puffs into the lungs 2 (two) times daily as needed for wheezing.  01/06/15  Yes Leone Brand, MD  pantoprazole (PROTONIX) 40 MG tablet Take 1 tablet (40 mg total) by mouth daily. 08/02/15  Yes Larey Dresser, MD  spironolactone (ALDACTONE) 25 MG tablet Take 1 tablet (25 mg total) by mouth daily. 08/02/15  Yes Larey Dresser, MD  XARELTO 20 MG TABS tablet TAKE ONE TABLET   BY MOUTH   DAILY 05/16/15  Yes Larey Dresser, MD    BP 105/70 mmHg  Pulse 102  Temp(Src) 98 F (36.7 C) (Oral)  Resp 16  SpO2 97% Physical Exam  Constitutional: He is oriented to person, place, and time. He appears well-developed and well-nourished. No distress.  HENT:  Head: Normocephalic and atraumatic.  Right Ear:  External ear normal.  Left Ear: External ear normal.  Nose: Nose normal.  Mouth/Throat: Uvula is midline, oropharynx is clear and moist and mucous membranes are normal.  Eyes: Conjunctivae, EOM and lids are normal. Pupils are equal, round, and reactive to light. Right eye exhibits no discharge. Left eye exhibits no discharge. No scleral icterus.  Neck: Normal range of motion. Neck supple.  Cardiovascular: Normal rate, regular rhythm, normal heart sounds, intact distal pulses and normal pulses.   Pulmonary/Chest: Effort normal and breath sounds normal. No respiratory distress. He has no wheezes. He has no rales.  Abdominal: Soft. Normal appearance and bowel sounds are normal. He exhibits no distension and no mass. There is no tenderness. There is no rigidity,  no rebound and no guarding.  Musculoskeletal: Normal range of motion. He exhibits tenderness. He exhibits no edema.  Tenderness to palpation of left lumbar paraspinal muscles. No midline tenderness, step-off, or deformity.  Neurological: He is alert and oriented to person, place, and time. He has normal strength and normal reflexes. No cranial nerve deficit or sensory deficit.  Skin: Skin is warm, dry and intact. No rash noted. He is not diaphoretic. No erythema. No pallor.  Psychiatric: He has a normal mood and affect. His speech is normal and behavior is normal.  Nursing note and vitals reviewed.   ED Course  Procedures (including critical care time)  Labs Review Labs Reviewed  CBC WITH DIFFERENTIAL/PLATELET - Abnormal; Notable for the following:    RBC 4.09 (*)    All other components within normal limits  BASIC METABOLIC PANEL    Imaging Review Mr Lumbar Spine W Wo Contrast  08/18/2015  CLINICAL DATA:  Chronic low back pain radiating to LEFT lower extremity for several years, worse over the last few days. History of substance abuse, hepatitis-C, hypertension, atrial fibrillation, colon cancer. EXAM: MRI LUMBAR SPINE WITHOUT AND  WITH CONTRAST TECHNIQUE: Multiplanar and multiecho pulse sequences of the lumbar spine were obtained without and with intravenous contrast. CONTRAST:  28mL MULTIHANCE GADOBENATE DIMEGLUMINE 529 MG/ML IV SOLN COMPARISON:  Lumbar spine radiographs Feb 23, 2015 FINDINGS: Lumbar vertebral bodies and posterior elements are intact with maintenance of lumbar lordosis. Using the reference level of the last well-formed intervertebral disc as L5-S1, minimal grade 1 L4-5 anterolisthesis. No spondylolysis. Intervertebral discs demonstrate relatively in normal morphology, decreased T2 signal within all discs compatible with mild desiccation. Moderate to severe subacute on chronic discogenic endplate change at D9-I3, mild subacute discogenic endplate changes at J8-2. Mild chronic discogenic endplate changes N0-5 through L4-5. No abnormal bone marrow signal to suggest fracture. No abnormal osseous or intradiscal enhancement. Mild congenital canal stenosis, and mild epidural lipomatosis. Conus medullaris terminates at L1 and appears normal in morphology and signal characteristics. Central displacement of the cauda equina due to canal stenosis, otherwise unremarkable. No abnormal cord, leptomeningeal nor epidural enhancement. Included prevertebral and paraspinal soft tissues are normal. Level by level evaluation: L1-2: 3 mm broad-based disc bulge, moderate facet arthropathy and ligamentum flavum redundancy. Mild canal stenosis. Mild LEFT neural foraminal narrowing. L2-3: Annular bulging, moderate facet arthropathy and ligamentum flavum redundancy. Very mild canal stenosis. Mild to moderate RIGHT, mild LEFT caudal neural foraminal narrowing. L3-4: 3 mm broad-based disc bulge. See moderate to severe facet arthropathy and ligamentum flavum redundancy with trace LEFT facet effusion which is likely reactive. Moderate osseous canal stenosis though, the thecal sac is effaced to 5 mm in AP dimension due to dorsal epidural fat. Moderate to  severe RIGHT, moderate LEFT neural foraminal narrowing. L4-5: Anterolisthesis. 3 mm broad-based disc bulge, severe facet arthropathy and ligamentum flavum redundancy result in moderate canal stenosis. Thecal sac is effaced to 3 mm in AP dimension due to dorsal epidural fat. Moderate bilateral neural foraminal narrowing. L5-S1: 5 mm broad-based disc bulge. Moderate to severe LEFT, moderate facet arthropathy and ligamentum flavum redundancy without canal stenosis though there encroachment upon the traversing S1 nerves. Moderate RIGHT, moderate to severe LEFT neural foraminal narrowing. IMPRESSION: No acute lumbar spine fracture. Grade 1 L4-5 anterolisthesis without spondylolysis. Degenerative change of the lumbar spine, superimposed on a background of congenital canal narrowing and epidural lipomatosis. Moderate osseous canal stenosis L3-4 and L4-5, however there is severe thecal sac effacement, predominantly due to dorsal  epidural fat. Neural foraminal narrowing at all lumbar levels: Moderate to severe on the RIGHT at L3-4 and on the LEFT at L5-S1. Electronically Signed   By: Elon Alas M.D.   On: 08/18/2015 05:32     I have personally reviewed and evaluated these images and lab results as part of my medical decision-making.   EKG Interpretation None      MDM   Final diagnoses:  Back pain    69 year old male presents with exacerbation of chronic left low back pain radiating to left lower extremity. He states his symptoms have progressively worsened over the past several days, prompting him to come to the ED. Denies recent injury, bowel or bladder incontinence, saddle anesthesia, fever, chills, chest pain, shortness of breath, abdominal pain, nausea, vomiting, diarrhea, constipation, dysuria, urgency  Patient is afebrile. Vital signs stable. Heart regular rate and rhythm. Lungs clear to auscultation bilaterally. Abdomen soft, nontender, nondistended. Tenderness to palpation of left lumbar  paraspinal muscles. No midline tenderness, step-off, or deformity. Strength, sensation, DTRs intact. Distal pulses intact.  Given patient's risk factors (IVDU, history of malignancy, anticoagulant use), will obtain MRI of lumbar spine. CBC negative for leukocytosis or anemia. BMP within normal limits. Imaging pending. Pain controlled with percocet.  MRI negative for acute fracture, reveals grade 1 L4-L5 anterolisthesis without spondylolysis, degenerative change of the lumbar spine superimposed on congenital canal narrowing and epidural lipomatosis, moderate osseous canal stenosis, neural foraminal narrowing at all lumbar levels.  Discussed findings with patient. He is well-appearing and stable for discharge at this time. Will give tylenol for pain. Patient to follow-up with neurosurgery for further evaluation and management. Return precautions discussed.  BP 122/79 mmHg  Pulse 74  Temp(Src) 98 F (36.7 C) (Oral)  Resp 14  SpO2 98%     Marella Chimes, PA-C 08/18/15 8416  Veryl Speak, MD 08/18/15 [phone]

## 2015-08-18 NOTE — ED Notes (Signed)
Pt. reports chronic low back pain radiating to left leg for several years worse this past several days , denies recent injury , ambulatory using his cane .

## 2015-08-20 ENCOUNTER — Other Ambulatory Visit: Payer: Self-pay

## 2015-08-20 ENCOUNTER — Telehealth: Payer: Self-pay | Admitting: Family Medicine

## 2015-08-20 DIAGNOSIS — M545 Low back pain, unspecified: Secondary | ICD-10-CM

## 2015-08-20 DIAGNOSIS — M79605 Pain in left leg: Secondary | ICD-10-CM

## 2015-08-20 MED ORDER — GABAPENTIN 300 MG PO CAPS: 300.0000 mg | ORAL_CAPSULE | Freq: Three times a day (TID) | ORAL | Status: DC

## 2015-08-20 MED ORDER — PANTOPRAZOLE SODIUM 40 MG PO TBEC: 40.0000 mg | DELAYED_RELEASE_TABLET | Freq: Every day | ORAL | Status: DC

## 2015-08-20 MED ORDER — SPIRONOLACTONE 25 MG PO TABS: 25.0000 mg | ORAL_TABLET | Freq: Every day | ORAL | Status: DC

## 2015-08-20 MED ORDER — AMIODARONE HCL 200 MG PO TABS: 200.0000 mg | ORAL_TABLET | Freq: Every day | ORAL | Status: DC

## 2015-08-20 MED ORDER — LOSARTAN POTASSIUM 25 MG PO TABS: 25.0000 mg | ORAL_TABLET | Freq: Every day | ORAL | Status: DC

## 2015-08-20 MED ORDER — ATORVASTATIN CALCIUM 20 MG PO TABS: 20.0000 mg | ORAL_TABLET | Freq: Every day | ORAL | Status: DC

## 2015-08-20 MED ORDER — RIVAROXABAN 20 MG PO TABS: ORAL_TABLET | ORAL | Status: DC

## 2015-08-20 MED ORDER — CARVEDILOL 3.125 MG PO TABS: ORAL_TABLET | ORAL | Status: DC

## 2015-08-20 NOTE — Telephone Encounter (Signed)
Patient called in requesting refill of many medications (8).  He left his bag of medications on a city bus and cannot find them.

## 2015-08-20 NOTE — Telephone Encounter (Signed)
Pt lost all his medications on the bus. He is asking for a sample of zarleto Please let him know

## 2015-08-21 NOTE — Telephone Encounter (Signed)
Will forward to Dr. Joyner. Brittane Grudzinski,CMA  

## 2015-08-22 NOTE — Telephone Encounter (Signed)
Attempted to call patient but his number is disconnected. If he calls back he can come to the [facility] and pick-up some Xarelto (15-20 pills) to get him to his next prescription.

## 2015-08-23 ENCOUNTER — Other Ambulatory Visit: Payer: Self-pay | Admitting: *Deleted

## 2015-08-23 MED ORDER — LOSARTAN POTASSIUM 25 MG PO TABS: 25.0000 mg | ORAL_TABLET | Freq: Every day | ORAL | Status: DC

## 2015-08-23 NOTE — Telephone Encounter (Signed)
Tried reaching patient again but number isn't currently working. Paul Clayton,CMA

## 2015-08-31 ENCOUNTER — Other Ambulatory Visit (HOSPITAL_COMMUNITY): Payer: Self-pay | Admitting: Cardiology

## 2015-09-05 ENCOUNTER — Encounter (HOSPITAL_COMMUNITY): Payer: Medicare Other

## 2015-09-12 DIAGNOSIS — Z5181 Encounter for therapeutic drug level monitoring: Secondary | ICD-10-CM | POA: Diagnosis not present

## 2015-09-19 ENCOUNTER — Other Ambulatory Visit (HOSPITAL_COMMUNITY): Payer: Self-pay | Admitting: Cardiology

## 2015-10-04 ENCOUNTER — Other Ambulatory Visit: Payer: Self-pay | Admitting: *Deleted

## 2015-10-04 DIAGNOSIS — J42 Unspecified chronic bronchitis: Secondary | ICD-10-CM

## 2015-10-06 MED ORDER — UMECLIDINIUM BROMIDE 62.5 MCG/INH IN AEPB: 1.0000 | INHALATION_SPRAY | Freq: Every day | RESPIRATORY_TRACT | Status: DC

## 2015-11-01 ENCOUNTER — Other Ambulatory Visit: Payer: Self-pay | Admitting: Cardiology

## 2015-11-07 DIAGNOSIS — J90 Pleural effusion, not elsewhere classified: Secondary | ICD-10-CM | POA: Diagnosis not present

## 2015-11-07 DIAGNOSIS — R0789 Other chest pain: Secondary | ICD-10-CM | POA: Diagnosis not present

## 2015-11-07 DIAGNOSIS — I110 Hypertensive heart disease with heart failure: Secondary | ICD-10-CM | POA: Diagnosis not present

## 2015-11-07 DIAGNOSIS — E7800 Pure hypercholesterolemia, unspecified: Secondary | ICD-10-CM | POA: Diagnosis not present

## 2015-11-07 DIAGNOSIS — E785 Hyperlipidemia, unspecified: Secondary | ICD-10-CM | POA: Diagnosis not present

## 2015-11-07 DIAGNOSIS — G8929 Other chronic pain: Secondary | ICD-10-CM | POA: Diagnosis not present

## 2015-11-07 DIAGNOSIS — F17210 Nicotine dependence, cigarettes, uncomplicated: Secondary | ICD-10-CM | POA: Diagnosis not present

## 2015-11-07 DIAGNOSIS — M25552 Pain in left hip: Secondary | ICD-10-CM | POA: Diagnosis not present

## 2015-11-07 DIAGNOSIS — I5030 Unspecified diastolic (congestive) heart failure: Secondary | ICD-10-CM | POA: Diagnosis not present

## 2015-11-07 DIAGNOSIS — J449 Chronic obstructive pulmonary disease, unspecified: Secondary | ICD-10-CM | POA: Diagnosis not present

## 2015-11-07 DIAGNOSIS — I5020 Unspecified systolic (congestive) heart failure: Secondary | ICD-10-CM | POA: Diagnosis not present

## 2015-11-07 DIAGNOSIS — R0600 Dyspnea, unspecified: Secondary | ICD-10-CM | POA: Diagnosis not present

## 2015-11-07 DIAGNOSIS — I4891 Unspecified atrial fibrillation: Secondary | ICD-10-CM | POA: Diagnosis not present

## 2015-11-07 DIAGNOSIS — Z79899 Other long term (current) drug therapy: Secondary | ICD-10-CM | POA: Diagnosis not present

## 2015-11-08 DIAGNOSIS — Z79899 Other long term (current) drug therapy: Secondary | ICD-10-CM | POA: Diagnosis not present

## 2015-11-08 DIAGNOSIS — F17210 Nicotine dependence, cigarettes, uncomplicated: Secondary | ICD-10-CM | POA: Diagnosis not present

## 2015-11-08 DIAGNOSIS — Z7951 Long term (current) use of inhaled steroids: Secondary | ICD-10-CM | POA: Diagnosis not present

## 2015-11-08 DIAGNOSIS — J449 Chronic obstructive pulmonary disease, unspecified: Secondary | ICD-10-CM | POA: Diagnosis not present

## 2015-11-08 DIAGNOSIS — I4891 Unspecified atrial fibrillation: Secondary | ICD-10-CM | POA: Diagnosis not present

## 2015-11-08 DIAGNOSIS — G8929 Other chronic pain: Secondary | ICD-10-CM | POA: Diagnosis not present

## 2015-11-08 DIAGNOSIS — E785 Hyperlipidemia, unspecified: Secondary | ICD-10-CM | POA: Diagnosis not present

## 2015-11-08 DIAGNOSIS — M25552 Pain in left hip: Secondary | ICD-10-CM | POA: Diagnosis not present

## 2015-11-08 DIAGNOSIS — E7800 Pure hypercholesterolemia, unspecified: Secondary | ICD-10-CM | POA: Diagnosis not present

## 2015-11-08 DIAGNOSIS — I10 Essential (primary) hypertension: Secondary | ICD-10-CM | POA: Diagnosis not present

## 2015-11-08 DIAGNOSIS — B1920 Unspecified viral hepatitis C without hepatic coma: Secondary | ICD-10-CM | POA: Diagnosis not present

## 2015-11-20 DIAGNOSIS — R918 Other nonspecific abnormal finding of lung field: Secondary | ICD-10-CM | POA: Diagnosis not present

## 2015-11-20 DIAGNOSIS — I517 Cardiomegaly: Secondary | ICD-10-CM | POA: Diagnosis not present

## 2015-11-20 DIAGNOSIS — R55 Syncope and collapse: Secondary | ICD-10-CM | POA: Diagnosis not present

## 2015-11-20 DIAGNOSIS — R9431 Abnormal electrocardiogram [ECG] [EKG]: Secondary | ICD-10-CM | POA: Diagnosis not present

## 2015-11-20 DIAGNOSIS — R0602 Shortness of breath: Secondary | ICD-10-CM | POA: Diagnosis not present

## 2015-11-20 DIAGNOSIS — E785 Hyperlipidemia, unspecified: Secondary | ICD-10-CM | POA: Insufficient documentation

## 2015-11-20 DIAGNOSIS — I5189 Other ill-defined heart diseases: Secondary | ICD-10-CM | POA: Diagnosis not present

## 2015-11-20 DIAGNOSIS — I10 Essential (primary) hypertension: Secondary | ICD-10-CM | POA: Diagnosis not present

## 2015-11-20 DIAGNOSIS — R0789 Other chest pain: Secondary | ICD-10-CM | POA: Diagnosis not present

## 2015-11-20 DIAGNOSIS — R079 Chest pain, unspecified: Secondary | ICD-10-CM | POA: Diagnosis not present

## 2015-11-20 DIAGNOSIS — I5042 Chronic combined systolic (congestive) and diastolic (congestive) heart failure: Secondary | ICD-10-CM | POA: Diagnosis not present

## 2015-11-20 DIAGNOSIS — D649 Anemia, unspecified: Secondary | ICD-10-CM | POA: Insufficient documentation

## 2015-11-20 DIAGNOSIS — I519 Heart disease, unspecified: Secondary | ICD-10-CM | POA: Diagnosis not present

## 2015-11-20 DIAGNOSIS — I081 Rheumatic disorders of both mitral and tricuspid valves: Secondary | ICD-10-CM | POA: Diagnosis not present

## 2015-11-20 DIAGNOSIS — R42 Dizziness and giddiness: Secondary | ICD-10-CM | POA: Diagnosis not present

## 2015-11-20 DIAGNOSIS — F1010 Alcohol abuse, uncomplicated: Secondary | ICD-10-CM | POA: Insufficient documentation

## 2015-11-26 ENCOUNTER — Emergency Department (HOSPITAL_COMMUNITY)
Admission: EM | Admit: 2015-11-26 | Discharge: 2015-11-27 | Disposition: A | Discharge status: Home / Self Care | Payer: Medicare Other | Attending: Emergency Medicine | Admitting: Emergency Medicine

## 2015-11-26 ENCOUNTER — Encounter (HOSPITAL_COMMUNITY): Payer: Self-pay | Admitting: *Deleted

## 2015-11-26 DIAGNOSIS — F419 Anxiety disorder, unspecified: Secondary | ICD-10-CM | POA: Insufficient documentation

## 2015-11-26 DIAGNOSIS — R404 Transient alteration of awareness: Secondary | ICD-10-CM | POA: Diagnosis not present

## 2015-11-26 DIAGNOSIS — M5442 Lumbago with sciatica, left side: Secondary | ICD-10-CM | POA: Diagnosis not present

## 2015-11-26 DIAGNOSIS — I10 Essential (primary) hypertension: Secondary | ICD-10-CM | POA: Insufficient documentation

## 2015-11-26 DIAGNOSIS — G8929 Other chronic pain: Secondary | ICD-10-CM | POA: Insufficient documentation

## 2015-11-26 DIAGNOSIS — I5022 Chronic systolic (congestive) heart failure: Secondary | ICD-10-CM | POA: Diagnosis not present

## 2015-11-26 DIAGNOSIS — M5432 Sciatica, left side: Secondary | ICD-10-CM | POA: Insufficient documentation

## 2015-11-26 DIAGNOSIS — Z8619 Personal history of other infectious and parasitic diseases: Secondary | ICD-10-CM | POA: Diagnosis not present

## 2015-11-26 DIAGNOSIS — R079 Chest pain, unspecified: Secondary | ICD-10-CM | POA: Diagnosis not present

## 2015-11-26 DIAGNOSIS — K219 Gastro-esophageal reflux disease without esophagitis: Secondary | ICD-10-CM | POA: Insufficient documentation

## 2015-11-26 DIAGNOSIS — I2510 Atherosclerotic heart disease of native coronary artery without angina pectoris: Secondary | ICD-10-CM | POA: Diagnosis not present

## 2015-11-26 DIAGNOSIS — F329 Major depressive disorder, single episode, unspecified: Secondary | ICD-10-CM | POA: Diagnosis not present

## 2015-11-26 DIAGNOSIS — R531 Weakness: Secondary | ICD-10-CM | POA: Diagnosis not present

## 2015-11-26 DIAGNOSIS — F17210 Nicotine dependence, cigarettes, uncomplicated: Secondary | ICD-10-CM | POA: Diagnosis not present

## 2015-11-26 DIAGNOSIS — G43909 Migraine, unspecified, not intractable, without status migrainosus: Secondary | ICD-10-CM | POA: Insufficient documentation

## 2015-11-26 DIAGNOSIS — J449 Chronic obstructive pulmonary disease, unspecified: Secondary | ICD-10-CM | POA: Insufficient documentation

## 2015-11-26 DIAGNOSIS — M545 Low back pain: Secondary | ICD-10-CM | POA: Diagnosis present

## 2015-11-26 DIAGNOSIS — Z79899 Other long term (current) drug therapy: Secondary | ICD-10-CM | POA: Insufficient documentation

## 2015-11-26 NOTE — ED Notes (Signed)
Pt reports recently being admitted for syncopal episode, has also been diagnosed with sciatica, pt reports being here today due to severe pain related to sciatica. Pain is in lower back and radiates down left leg. Reports difficulty ambulating due to pain and had episode of urinary incontinence pta, pt is taking fluid pills as prescribed.

## 2015-11-26 NOTE — ED Notes (Signed)
No answer when pts name called to reassess vital signs

## 2015-11-26 NOTE — ED Notes (Signed)
No response for VS reassessment.

## 2015-11-26 NOTE — ED Notes (Signed)
Pt requesting social work consult, is homeless and needs help getting into a shelter and with medications.

## 2015-11-27 DIAGNOSIS — M5432 Sciatica, left side: Secondary | ICD-10-CM | POA: Diagnosis not present

## 2015-11-27 LAB — CBC WITH DIFFERENTIAL/PLATELET
BASOS ABS: 0 10*3/uL (ref 0.0–0.1)
BASOS PCT: 0 %
EOS ABS: 0.3 10*3/uL (ref 0.0–0.7)
EOS PCT: 6 %
HCT: 38.8 % — ABNORMAL LOW (ref 39.0–52.0)
HEMOGLOBIN: 13.8 g/dL (ref 13.0–17.0)
Lymphocytes Relative: 38 %
Lymphs Abs: 2.2 10*3/uL (ref 0.7–4.0)
MCH: 34.2 pg — ABNORMAL HIGH (ref 26.0–34.0)
MCHC: 35.6 g/dL (ref 30.0–36.0)
MCV: 96 fL (ref 78.0–100.0)
Monocytes Absolute: 0.5 10*3/uL (ref 0.1–1.0)
Monocytes Relative: 9 %
NEUTROS PCT: 47 %
Neutro Abs: 2.7 10*3/uL (ref 1.7–7.7)
PLATELETS: 148 10*3/uL — AB (ref 150–400)
RBC: 4.04 MIL/uL — AB (ref 4.22–5.81)
RDW: 12.2 % (ref 11.5–15.5)
WBC: 5.7 10*3/uL (ref 4.0–10.5)

## 2015-11-27 LAB — BASIC METABOLIC PANEL
ANION GAP: 12 (ref 5–15)
BUN: 11 mg/dL (ref 6–20)
CO2: 23 mmol/L (ref 22–32)
Calcium: 9 mg/dL (ref 8.9–10.3)
Chloride: 103 mmol/L (ref 101–111)
Creatinine, Ser: 0.85 mg/dL (ref 0.61–1.24)
Glucose, Bld: 117 mg/dL — ABNORMAL HIGH (ref 65–99)
POTASSIUM: 5 mmol/L (ref 3.5–5.1)
SODIUM: 138 mmol/L (ref 135–145)

## 2015-11-27 LAB — TROPONIN I

## 2015-11-27 MED ORDER — PREDNISONE 20 MG PO TABS: ORAL_TABLET | ORAL | Status: DC

## 2015-11-27 MED ORDER — OXYCODONE-ACETAMINOPHEN 5-325 MG PO TABS: 2.0000 | ORAL_TABLET | ORAL | Status: DC | PRN

## 2015-11-27 MED ORDER — ONDANSETRON HCL 4 MG/2ML IJ SOLN
4.0000 mg | Freq: Once | INTRAMUSCULAR | Status: AC
  Administered 2015-11-27: 4 mg via INTRAVENOUS
  Filled 2015-11-27: qty 2

## 2015-11-27 MED ORDER — HYDROMORPHONE HCL 1 MG/ML IJ SOLN
1.0000 mg | Freq: Once | INTRAMUSCULAR | Status: AC
  Administered 2015-11-27: 1 mg via INTRAVENOUS
  Filled 2015-11-27: qty 1

## 2015-11-27 NOTE — ED Notes (Signed)
Pt approached nurse first station requesting how much longer until he is called for a room. Pt informed he was d/c out of the system because his name was called with no answer. Pt informed staff he was sleeping in the waiting room chairs next to big window and did not hear anyone call his name. Pts name un-discharged from system and to be taken back to next room shortly. Vital signs to be reassessed.

## 2015-11-27 NOTE — ED Provider Notes (Signed)
CSN: [id number]     Arrival date & time 11/26/15  1848 History  By signing my name below, I, Paul Clayton, attest that this documentation has been prepared under the direction and in the presence of [name], MD. Electronically Signed: Altamease Clayton, ED Scribe. 11/27/2015. 2:41 AM  Chief Complaint  Patient presents with  . Pain    The history is provided by the patient. No language interpreter was used.   Paul Clayton is a 70 y.o. male with history of chronic back pain, a-fib, CAD, non-ischemic cardiomyopathy, CHF, HTN, and cocaine abuse who presents to the Emergency Department complaining of worsening, 10/10 in severity, left lower back pain with onset 2-3 months ago. The pain radiates to the left hip. He states that he has been on "4 or 5 different pain pills" and none of then have provided sufficient pain relief. The pain is worse with walking.   Pt also complaining of an episode of chest pain earlier in the day that has resolved. He states that he has been out of the Xarelto and Lasix but recently refilled those medications.     Past Medical History  Diagnosis Date  . Persistent atrial fibrillation (HCC)     a. s/p DCCV; b. Amiodarone  . Hypertension   . Chronic systolic CHF (congestive heart failure) (HCC)     a. EF previously 15%; b. Echo 7/16:  EF 30-35%, diff HK, gr 1 DD, mild MR, mild LAE, mild reduced RVSF  . NICM (nonischemic cardiomyopathy) ([place])     no obs CAD on LHC in 1/16 - ? HTN or substance abuse  . CAD (coronary artery disease)     a. LHC 1/16:  LM 20%, oLAD 30%, oLCx 30%, pRCA 30%  . Adenocarcinoma of colon ([place])     STAGE 1  . Substance abuse     ETOH; crack cocaine  . COPD (chronic obstructive pulmonary disease) ([place])   . Tobacco abuse   . GERD (gastroesophageal reflux disease)   . Hepatitis C   . Migraine     "last one was back in the 1980's" (04/19/2015)  . Arthritis     "left hip" (04/19/2015)  . Chronic lower back pain   . Anxiety    . Depression    Past Surgical History  Procedure Laterality Date  . Cardioversion      "I've had 2 in all" (04/19/2015)  . Colonoscopy    . Left and right heart catheterization with coronary angiogram N/A 10/23/2014    Procedure: LEFT AND RIGHT HEART CATHETERIZATION WITH CORONARY ANGIOGRAM;  Surgeon: Larey Dresser, MD;  Location: [facility] CATH LAB;  Service: Cardiovascular;  Laterality: N/A;  . Colonoscopy with propofol N/A 11/30/2014    Procedure: COLONOSCOPY WITH PROPOFOL;  Surgeon: Milus Banister, MD;  Location: [facility] ENDOSCOPY;  Service: Endoscopy;  Laterality: N/A;  . Cardioversion N/A 01/30/2015    Procedure: CARDIOVERSION;  Surgeon: Larey Dresser, MD;  Location: [facility] ENDOSCOPY;  Service: Cardiovascular;  Laterality: N/A;  . Cardiac catheterization  10/23/2014   Family History  Problem Relation Age of Onset  . Hypertension Mother    Social History  Substance Use Topics  . Smoking status: Light Tobacco Smoker -- 0.10 packs/day for 52 years    Types: Cigarettes    Last Attempt to Quit: 09/01/2014  . Smokeless tobacco: Never Used     Comment: 04/19/2015 "probably smoke 1 pack cigarettes/month"  . Alcohol Use: 0.0 oz/week    0 Standard drinks  or equivalent per week     Comment: 04/19/2015 "I might drink a 40oz beer/month"    Review of Systems  Cardiovascular: Positive for chest pain.  Musculoskeletal: Positive for back pain.  All other systems reviewed and are negative.  Allergies  Review of patient's allergies indicates no known allergies.  Home Medications   Prior to Admission medications   Medication Sig Start Date End Date Taking? Authorizing Provider  acetaminophen (TYLENOL) 500 MG tablet Take 1 tablet (500 mg total) by mouth every 6 (six) hours as needed. 08/18/15   Marella Chimes, PA-C  amiodarone (PACERONE) 200 MG tablet Take 1 tablet (200 mg total) by mouth daily. 08/20/15   Larey Dresser, MD  atorvastatin (LIPITOR) 20 MG tablet Take 1 tablet (20 mg total) by mouth  daily. 08/20/15   Larey Dresser, MD  baclofen (LIORESAL) 10 MG tablet Take 10 mg by mouth daily as needed for muscle spasms (pain).    Historical Provider, MD  carvedilol (COREG) 3.125 MG tablet TAKE 1 TABLET (3.125 MG TOTAL) BY MOUTH TWO   (TWO) TIMES DAILY WITH A MEAL. 08/20/15   Larey Dresser, MD  furosemide (LASIX) 40 MG tablet TAKE 1 TABLET BY MOUTH   DAILY 05/16/15   Larey Dresser, MD  gabapentin (NEURONTIN) 300 MG capsule TAKE 1 CAPSULE (300 MG TOTAL) BY MOUTH THREE   (THREE) TIMES DAILY. 11/01/15   Larey Dresser, MD  losartan (COZAAR) 25 MG tablet Take 1 tablet (25 mg total) by mouth daily. 08/23/15   Larey Dresser, MD  mometasone-formoterol [facility]) 200-5 MCG/ACT AERO Inhale 2 puffs into the lungs 2 (two) times daily as needed for wheezing. Patient taking differently: Inhale 2 puffs into the lungs 2 (two) times daily as needed for wheezing.  01/06/15   Leone Brand, MD  pantoprazole (PROTONIX) 40 MG tablet Take 1 tablet (40 mg total) by mouth daily. 08/20/15   Larey Dresser, MD  rivaroxaban (XARELTO) 20 MG TABS tablet TAKE ONE TABLET   BY MOUTH   DAILY 08/20/15   Larey Dresser, MD  spironolactone (ALDACTONE) 25 MG tablet Take 1 tablet (25 mg total) by mouth daily. 08/20/15   Larey Dresser, MD  Umeclidinium Bromide (INCRUSE ELLIPTA) 62.5 MCG/INH AEPB Inhale 1 Inhaler into the lungs daily. 10/06/15   Olam Idler, MD   BP 193/99 mmHg  Pulse 100  Temp(Src) 98.4 F (36.9 C) (Oral)  Resp 18  SpO2 98% Physical Exam  Constitutional: He is oriented to person, place, and time. He appears well-developed and well-nourished. No distress.  HENT:  Head: Normocephalic and atraumatic.  Right Ear: Hearing normal.  Left Ear: Hearing normal.  Nose: Nose normal.  Mouth/Throat: Oropharynx is clear and moist and mucous membranes are normal.  Eyes: Conjunctivae and EOM are normal. Pupils are equal, round, and reactive to light.  Neck: Normal range of motion. Neck supple.   Cardiovascular: Regular rhythm, S1 normal and S2 normal.  Exam reveals no gallop and no friction rub.   No murmur heard. Pulmonary/Chest: Effort normal and breath sounds normal. No respiratory distress. He exhibits no tenderness.  Abdominal: Soft. Normal appearance and bowel sounds are normal. There is no hepatosplenomegaly. There is no tenderness. There is no rebound, no guarding, no tenderness at McBurney's point and negative Murphy's sign. No hernia.  Musculoskeletal:  Left lower back soft tissue tenderness. Left hip and thigh tenderness. Pain with ROM.  Neurological: He is alert and oriented to person, place,  and time. He has normal strength. No cranial nerve deficit or sensory deficit. Coordination normal. GCS eye subscore is 4. GCS verbal subscore is 5. GCS motor subscore is 6.  Skin: Skin is warm, dry and intact. No rash noted. No cyanosis.  Psychiatric: He has a normal mood and affect. His speech is normal and behavior is normal. Thought content normal.  Nursing note and vitals reviewed.   ED Course  Procedures (including critical care time) DIAGNOSTIC STUDIES: Oxygen Saturation is 98% on RA,  normal by my interpretation.    COORDINATION OF CARE: 2:29 AM Discussed treatment plan with pt at bedside and pt agreed to plan.  Labs Review Labs Reviewed - No data to display  Imaging Review No results found.    EKG Interpretation None      MDM   Final diagnoses:  None  sciatica Chest pain  Patient presents to the emergency department for evaluation of multiple problems. Patient's main complaint is pain in his left hip and leg. This has been ongoing for a month or more. He has been treated for sciatica. Patient reports that the pain has worsened over last couple of days. Examination reveals normal strength and sensation. There is no saddle anesthesia or foot drop. Patient has not had change of bowel or bladder function. He does not require emergent imaging. He was treated  with analgesia.  Patient reports that he did have chest pain earlier in the day. This has resolved prior to evaluation. Cardiac workup unremarkable.  I personally performed the services described in this documentation, which was scribed in my presence. The recorded information has been reviewed and is accurate.     [name], MD 11/27/15 [phone]

## 2015-11-27 NOTE — ED Notes (Signed)
Pt given Sprite 

## 2015-11-27 NOTE — Discharge Instructions (Signed)

## 2015-11-27 NOTE — ED Notes (Signed)
Pt given sandwich and drink.

## 2015-11-28 ENCOUNTER — Ambulatory Visit: Payer: Medicare Other | Admitting: Family Medicine

## 2015-12-03 ENCOUNTER — Emergency Department (HOSPITAL_COMMUNITY): Payer: Medicare Other

## 2015-12-03 ENCOUNTER — Emergency Department (HOSPITAL_COMMUNITY)
Admission: EM | Admit: 2015-12-03 | Discharge: 2015-12-03 | Disposition: A | Discharge status: Home / Self Care | Payer: Medicare Other | Attending: Emergency Medicine | Admitting: Emergency Medicine

## 2015-12-03 ENCOUNTER — Encounter (HOSPITAL_COMMUNITY): Payer: Self-pay

## 2015-12-03 DIAGNOSIS — F419 Anxiety disorder, unspecified: Secondary | ICD-10-CM | POA: Insufficient documentation

## 2015-12-03 DIAGNOSIS — Z8619 Personal history of other infectious and parasitic diseases: Secondary | ICD-10-CM | POA: Diagnosis not present

## 2015-12-03 DIAGNOSIS — W1839XA Other fall on same level, initial encounter: Secondary | ICD-10-CM | POA: Diagnosis not present

## 2015-12-03 DIAGNOSIS — Z9889 Other specified postprocedural states: Secondary | ICD-10-CM | POA: Diagnosis not present

## 2015-12-03 DIAGNOSIS — M1612 Unilateral primary osteoarthritis, left hip: Secondary | ICD-10-CM | POA: Diagnosis not present

## 2015-12-03 DIAGNOSIS — G8929 Other chronic pain: Secondary | ICD-10-CM | POA: Insufficient documentation

## 2015-12-03 DIAGNOSIS — S79912A Unspecified injury of left hip, initial encounter: Secondary | ICD-10-CM | POA: Insufficient documentation

## 2015-12-03 DIAGNOSIS — M5442 Lumbago with sciatica, left side: Secondary | ICD-10-CM | POA: Diagnosis not present

## 2015-12-03 DIAGNOSIS — F329 Major depressive disorder, single episode, unspecified: Secondary | ICD-10-CM | POA: Diagnosis not present

## 2015-12-03 DIAGNOSIS — G43909 Migraine, unspecified, not intractable, without status migrainosus: Secondary | ICD-10-CM | POA: Diagnosis not present

## 2015-12-03 DIAGNOSIS — I5022 Chronic systolic (congestive) heart failure: Secondary | ICD-10-CM | POA: Diagnosis not present

## 2015-12-03 DIAGNOSIS — Z79899 Other long term (current) drug therapy: Secondary | ICD-10-CM | POA: Diagnosis not present

## 2015-12-03 DIAGNOSIS — S79922A Unspecified injury of left thigh, initial encounter: Secondary | ICD-10-CM | POA: Insufficient documentation

## 2015-12-03 DIAGNOSIS — M25552 Pain in left hip: Secondary | ICD-10-CM | POA: Diagnosis not present

## 2015-12-03 DIAGNOSIS — Z7901 Long term (current) use of anticoagulants: Secondary | ICD-10-CM | POA: Diagnosis not present

## 2015-12-03 DIAGNOSIS — M545 Low back pain: Secondary | ICD-10-CM | POA: Diagnosis not present

## 2015-12-03 DIAGNOSIS — Z86018 Personal history of other benign neoplasm: Secondary | ICD-10-CM | POA: Insufficient documentation

## 2015-12-03 DIAGNOSIS — I481 Persistent atrial fibrillation: Secondary | ICD-10-CM | POA: Diagnosis not present

## 2015-12-03 DIAGNOSIS — M79606 Pain in leg, unspecified: Secondary | ICD-10-CM | POA: Diagnosis not present

## 2015-12-03 DIAGNOSIS — Y9389 Activity, other specified: Secondary | ICD-10-CM | POA: Diagnosis not present

## 2015-12-03 DIAGNOSIS — S3992XA Unspecified injury of lower back, initial encounter: Secondary | ICD-10-CM | POA: Diagnosis not present

## 2015-12-03 DIAGNOSIS — Y92480 Sidewalk as the place of occurrence of the external cause: Secondary | ICD-10-CM | POA: Diagnosis not present

## 2015-12-03 DIAGNOSIS — K219 Gastro-esophageal reflux disease without esophagitis: Secondary | ICD-10-CM | POA: Insufficient documentation

## 2015-12-03 DIAGNOSIS — J449 Chronic obstructive pulmonary disease, unspecified: Secondary | ICD-10-CM | POA: Diagnosis not present

## 2015-12-03 DIAGNOSIS — F17210 Nicotine dependence, cigarettes, uncomplicated: Secondary | ICD-10-CM | POA: Diagnosis not present

## 2015-12-03 DIAGNOSIS — I2510 Atherosclerotic heart disease of native coronary artery without angina pectoris: Secondary | ICD-10-CM | POA: Diagnosis not present

## 2015-12-03 DIAGNOSIS — W19XXXA Unspecified fall, initial encounter: Secondary | ICD-10-CM

## 2015-12-03 DIAGNOSIS — Y998 Other external cause status: Secondary | ICD-10-CM | POA: Diagnosis not present

## 2015-12-03 MED ORDER — OXYCODONE-ACETAMINOPHEN 5-325 MG PO TABS: 1.0000 | ORAL_TABLET | ORAL | Status: DC | PRN

## 2015-12-03 MED ORDER — OXYCODONE-ACETAMINOPHEN 5-325 MG PO TABS
1.0000 | ORAL_TABLET | Freq: Once | ORAL | Status: AC
  Administered 2015-12-03: 1 via ORAL
  Filled 2015-12-03: qty 1

## 2015-12-03 NOTE — ED Notes (Signed)
Per EMS pt fall on side walk today. Pt reports sciatica on left side and fell on left hip and thigh. No deformity. Denies LOC or hitting head. Pt has pain to left hip and back. Vitals WNL.

## 2015-12-03 NOTE — Discharge Instructions (Signed)
Take your medications as prescribed. Continue taking your home medications as prescribed. I also recommend applying ice for 15-20 minutes to affected region 3-4 times daily as needed for relief. Please follow up with a primary care provider from the Resource Guide provided below in 3-4 days. Please return to the Emergency Department if symptoms worsen or new onset of fever, numbness, tingling, groin anesthesia, loss of bowel or bladder, weakness.    Emergency Department Resource Guide 1) Find a Doctor and Pay Out of Pocket Although you won't have to find out who is covered by your insurance plan, it is a good idea to ask around and get recommendations. You will then need to call the office and see if the doctor you have chosen will accept you as a new patient and what types of options they offer for patients who are self-pay. Some doctors offer discounts or will set up payment plans for their patients who do not have insurance, but you will need to ask so you aren't surprised when you get to your appointment.  2) Contact Your Local Health Department Not all health departments have doctors that can see patients for sick visits, but many do, so it is worth a call to see if yours does. If you don't know where your local health department is, you can check in your phone book. The CDC also has a tool to help you locate your state's health department, and many state websites also have listings of all of their local health departments.  3) Find a [facility] If your illness is not likely to be very severe or complicated, you may want to try a walk in clinic. These are popping up all over the country in pharmacies, drugstores, and shopping centers. They're usually staffed by nurse practitioners or physician assistants that have been trained to treat common illnesses and complaints. They're usually fairly quick and inexpensive. However, if you have serious medical issues or chronic medical problems, these are  probably not your best option.  No Primary Care Doctor: - Call Health Connect at  [phone] - they can help you locate a primary care doctor that  accepts your insurance, provides certain services, etc. - Physician Referral Service- [phone]  Chronic Pain Problems: Organization         Address  Phone   Notes  [facility]  [phone] Patients need to be referred by their primary care doctor.   Medication Assistance: Organization         Address  Phone   Notes  [facility] Medication [facility] [address] [place], [place] [address] [phone] --Must be a resident of [facility] -- Must have NO insurance coverage whatsoever (no Medicaid/ Medicare, etc.) -- The pt. MUST have a primary care doctor that directs their care regularly and follows them in the community   MedAssist  [phone]   [employer]  [phone]    Agencies that provide inexpensive medical care: Organization         Address  Phone   Notes  [place]  [phone]   [facility]    [phone]   [facility] [place],  [address] [phone]   [place] [address] [place] [phone]   Planned Parenthood    [phone]   [facility]    [phone]   Community Health  and [place] [address], [place] Phone:  [phone], Fax:  [phone] Hours of Operation:  9 am - 6 pm, M-F.  Also accepts Medicaid/Medicare and self-pay.  [facility] for [place], [address], [place] Phone: [phone], Fax: [phone]. Hours of Operation:  8:30 am - 5:30 pm, M-F.  Also accepts Medicaid and self-pay.  [facility] [place] [address], [place] Phone: [phone]   [place], [place], [place] [phone], Ext. [phone] Mondays & Thursdays:  7-9 AM.  First 15 patients are seen on a first come, first serve basis.    [place] Providers:  Organization         Address  Phone   Notes  [facility] [address], [place] [phone] Also accepts self-pay patients.  [facility] [id number] [place], [place]  [phone]   [place], [address], [place] [phone]   [facility] [address], [place] [phone]   Lucianne Lei [address], [place]   [phone] Only accepts [place] Access [place] patients after they have their name applied to their card.   Self-Pay (no insurance) in [facility]:  Organization         Address  Phone   Notes  Sickle Cell Patients, [facility] [place] [phone]   [facility] [place] [phone]   [facility] [place]  [place], [place], [place] [phone]   [facility]/Dr. Osei-Bonsu  [address], [place] or [address], [place] [phone] Phone number for both [place] and [place] locations is the same.  [facility] [address] [place] [phone]   [facility] [address] [place] or [address] [phone]   [facility] [address] [place] [phone], phone; [phone], fax Sees patients 1st and 3rd Saturday of every month.  Must not qualify for public or private insurance (i.e. Medicaid, Medicare, [place] Health Choice, Veterans' Benefits)  Household income should be no more than 200% of the poverty level The clinic cannot treat you if you are pregnant or think you are pregnant  Sexually transmitted diseases are not treated at the clinic.     Dental Care: Organization         Address  Phone  Notes  [facility] Department of [facility] [phone] Accepts children up to age 63 who are enrolled in [place] or [place]; pregnant women with a Medicaid card; and children who have applied for Medicaid or [place] Health Choice, but were declined, whose parents can pay a reduced fee at time of service.  [facility] Department of [facility]  [address], [place] [phone] Accepts children up to age 19 who are enrolled in [place] or [place]; pregnant women with a Medicaid card; and children who have applied for Medicaid or [place] Health Choice, but were declined, whose parents can pay a reduced fee at time of service.  [place] [facility]  [address]  Mardene Speak [phone] Patients are seen by appointment only. Walk-ins are not accepted. [place] will see patients 53 years of age and older. Monday - Tuesday (8am-5pm) Most Wednesdays (8:30-5pm) $30 per visit, cash only  [facility] Adult Dental Access PROGRAM  [address], [facility] [phone] Patients are seen by appointment only. Walk-ins are not accepted. [place] will see patients 29 years of age and older. One Wednesday Evening (Monthly: Volunteer Based).  $30 per visit, cash only  [place]  [phone] for adults; Children under age 47, call Graduate Pediatric Dentistry at [phone]. Children aged 48-14, please call [phone] to request a pediatric application.  Dental services are provided in all areas of dental care including fillings, crowns and bridges, complete and partial dentures, implants, gum treatment, root canals, and extractions. Preventive care is also provided. Treatment is provided to both adults and children. Patients are selected via a lottery and there is often a waiting  list.   [facility] [address] [place]  [phone] [email or web address]   [facility] [place] [facility] [address] [place], [place] [phone], Ext. [phone] Second and Fourth Thursday of each month, opens at 6:30 AM; Clinic ends at 9 AM.  Patients are seen on a first-come first-served basis, and a limited number are seen during each clinic.   [facility]  [address] Hillard Danker [place], [place] [phone]   Eligibility Requirements You must have lived in [place], [place], or [place] counties for at least the last three months.   You cannot be eligible for state or federal sponsored [employer], including [employer], [place], or [employer].   You generally cannot be eligible for healthcare insurance through your employer.    How to apply: Eligibility screenings are held every Tuesday and Wednesday afternoon from 1:00 pm until 4:00 pm. You do not need an appointment for the interview!  [facility] [address] [place], [place]   [place]  [place] Department  [place]  [phone]    Behavioral Health Resources in the Community: Intensive Outpatient Programs Organization         Address  Phone  Notes  [place]. [address] [place], [place] [phone]   [facility] Outpatient [address] [place], [place]   ADS: Alcohol & Drug Svcs [address], [place], [place]   [place] [address]  [place], [place] or [phone]   Substance Abuse Resources Organization         Address  Phone  Notes  Alcohol and Drug Services  [phone]   [place]  [phone]   [place]   Chinita Pester  [phone]   Residential & Outpatient Substance Abuse Program   [phone]   Psychological Services Organization         Address  Phone  Notes  [facility]  [place]  [phone]   [place] [address] [place] or [phone]    Mobile Crisis Teams Organization         Address  Phone  Notes  [facility], Mobile Crisis Care Unit  [phone]   [facility]  [address] [place], [place]   [place]  [place] [phone]    Self-Help/Support Groups Organization         Address  Phone             Notes  [facility] [place] - variety of support groups  [place] Call for more information  Narcotics Anonymous (NA), [facility] [address], [employer] [place]  2 meetings at this location   Special educational needs teacher         Address  Phone  Notes  [facility] [place],    [place]  [phone]   [facility]  [address] [place] [id number], [place], [place]   [place], [place] [phone] Admissions: 8am-3pm M-F  Incentives Substance [place] [phone],    [place], [place] [phone]   [facility] [address] [place], [place], [place]   [facility] [address],  [place], [place]   [facility] - Intensive Outpatient [address] Kristeen [address], [place], [place]   [facility] ([place].,  [place], [place] [phone] or [phone]   [facility]idential Treatment Services (RTS) [address], [place], [place] Accepts Medicaid  [facility] [place] [address],  [place] [phone] Substance Abuse/Addiction Treatment   [facility] [place] [facility] Organization          Address  Phone  Notes  [facility]  [phone]   Domenic Schwab, PhD [address] Arlis Porta [place], [place]   [phone] or [phone]   [place], [place] [phone]   [facility] [address] [place], [place] [phone] Insurance/Medicaid/sponsorship through [facility] [place] and Families [address] [place]                                    [place], [place] [phone] [place] [address], [place] [phone]    Dr. Adele Schilder  [phone]   [facility] Dept. 1) [address] [place] 2) [place] 3)  [place] [address], [place] [phone]  [phone]   [place] [phone] or [phone] (After Hours)

## 2015-12-03 NOTE — ED Provider Notes (Signed)
CSN: [id number]     Arrival date & time 12/03/15  1355 History   First MD Initiated Contact with Patient 12/03/15 2115     Chief Complaint  Patient presents with  . Fall     (Consider location/radiation/quality/duration/timing/severity/associated sxs/prior Treatment) HPI   Patient is a 70 year old male with PMH of afib (on Eliquis), HTN, CHF, CAD, COPD, Hepatitis C, arthritis who presents to the ED via EMS s/p fall, onset PTA. Pt reports his chronic back pain has worsened over the past few weeks. He reports having constant sharp pain that starts in his mid lower back and radiates down his left posterior leg, pain worse with ambulation or movement. He notes when he was walking today his pain became so severe resulting in his left leg giving out on him causing him to fall. Denies head injury or LOC. Pt denies fever, numbness, tingling, saddle anesthesia, loss of bowel or bladder, weakness, IVDU, cancer or recent spinal manipulation. Pt reports he has been taking OTC meds without relief. He notes he has not seen his PCP in a few months due to being in [place] and returning to [place] a few days ago.   Past Medical History  Diagnosis Date  . Persistent atrial fibrillation (HCC)     a. s/p DCCV; b. Amiodarone  . Hypertension   . Chronic systolic CHF (congestive heart failure) (HCC)     a. EF previously 15%; b. Echo 7/16:  EF 30-35%, diff HK, gr 1 DD, mild MR, mild LAE, mild reduced RVSF  . NICM (nonischemic cardiomyopathy) ([place])     no obs CAD on LHC in 1/16 - ? HTN or substance abuse  . CAD (coronary artery disease)     a. LHC 1/16:  LM 20%, oLAD 30%, oLCx 30%, pRCA 30%  . Adenocarcinoma of colon ([place])     STAGE 1  . Substance abuse     ETOH; crack cocaine  . COPD (chronic obstructive pulmonary disease) ([place])   . Tobacco abuse   . GERD (gastroesophageal reflux disease)   . Hepatitis C   . Migraine     "last one was back in the 1980's" (04/19/2015)  . Arthritis     "left hip"  (04/19/2015)  . Chronic lower back pain   . Anxiety   . Depression    Past Surgical History  Procedure Laterality Date  . Cardioversion      "I've had 2 in all" (04/19/2015)  . Colonoscopy    . Left and right heart catheterization with coronary angiogram N/A 10/23/2014    Procedure: LEFT AND RIGHT HEART CATHETERIZATION WITH CORONARY ANGIOGRAM;  Surgeon: Larey Dresser, MD;  [facility];  Service: Cardiovascular;  Laterality: N/A;  . Colonoscopy with propofol N/A 11/30/2014    Procedure: COLONOSCOPY WITH PROPOFOL;  Surgeon: Milus Banister, MD;  Location: [facility] ENDOSCOPY;  Service: Endoscopy;  Laterality: N/A;  . Cardioversion N/A 01/30/2015    Procedure: CARDIOVERSION;  Surgeon: Larey Dresser, MD;  [facility] ENDOSCOPY;  Service: Cardiovascular;  Laterality: N/A;  . Cardiac catheterization  10/23/2014   Family History  Problem Relation Age of Onset  . Hypertension Mother    Social History  Substance Use Topics  . Smoking status: Light Tobacco Smoker -- 0.10 packs/day for 52 years    Types: Cigarettes    Last Attempt to Quit: 09/01/2014  . Smokeless tobacco: Never Used     Comment: 04/19/2015 "probably smoke 1 pack cigarettes/month"  . Alcohol Use: 0.0 oz/week  0 Standard drinks or equivalent per week     Comment: 04/19/2015 "I might drink a 40oz beer/month"    Review of Systems  Musculoskeletal: Positive for back pain and arthralgias (left hip/thigh pain).  All other systems reviewed and are negative.     Allergies  Review of patient's allergies indicates no known allergies.  Home Medications   Prior to Admission medications   Medication Sig Start Date End Date Taking? Authorizing Provider  acetaminophen (TYLENOL) 500 MG tablet Take 1 tablet (500 mg total) by mouth every 6 (six) hours as needed. 08/18/15   Marella Chimes, PA-C  amiodarone (PACERONE) 200 MG tablet Take 1 tablet (200 mg total) by mouth daily. 08/20/15   Larey Dresser, MD  atorvastatin  (LIPITOR) 20 MG tablet Take 1 tablet (20 mg total) by mouth daily. 08/20/15   Larey Dresser, MD  baclofen (LIORESAL) 10 MG tablet Take 10 mg by mouth daily as needed for muscle spasms (pain).    Historical Provider, MD  carvedilol (COREG) 3.125 MG tablet TAKE 1 TABLET (3.125 MG TOTAL) BY MOUTH TWO   (TWO) TIMES DAILY WITH A MEAL. 08/20/15   Larey Dresser, MD  furosemide (LASIX) 40 MG tablet TAKE 1 TABLET BY MOUTH   DAILY 05/16/15   Larey Dresser, MD  gabapentin (NEURONTIN) 300 MG capsule TAKE 1 CAPSULE (300 MG TOTAL) BY MOUTH THREE   (THREE) TIMES DAILY. 11/01/15   Larey Dresser, MD  losartan (COZAAR) 25 MG tablet Take 1 tablet (25 mg total) by mouth daily. 08/23/15   Larey Dresser, MD  mometasone-formoterol [facility]) 200-5 MCG/ACT AERO Inhale 2 puffs into the lungs 2 (two) times daily as needed for wheezing. Patient taking differently: Inhale 2 puffs into the lungs 2 (two) times daily as needed for wheezing.  01/06/15   Leone Brand, MD  oxyCODONE-acetaminophen (PERCOCET/ROXICET) 5-325 MG tablet Take 1 tablet by mouth every 4 (four) hours as needed for severe pain. 12/03/15   Nona Dell, PA-C  pantoprazole (PROTONIX) 40 MG tablet Take 1 tablet (40 mg total) by mouth daily. 08/20/15   Larey Dresser, MD  predniSONE (DELTASONE) 20 MG tablet 3 tabs po daily x 3 days, then 2 tabs x 3 days, then 1.5 tabs x 3 days, then 1 tab x 3 days, then 0.5 tabs x 3 days 11/27/15   [name], MD  rivaroxaban (XARELTO) 20 MG TABS tablet TAKE ONE TABLET   BY MOUTH   DAILY 08/20/15   Larey Dresser, MD  spironolactone (ALDACTONE) 25 MG tablet Take 1 tablet (25 mg total) by mouth daily. 08/20/15   Larey Dresser, MD  Umeclidinium Bromide (INCRUSE ELLIPTA) 62.5 MCG/INH AEPB Inhale 1 Inhaler into the lungs daily. 10/06/15   Olam Idler, MD   BP 134/85 mmHg  Pulse 62  Temp(Src) 97.5 F (36.4 C) (Oral)  Resp 19  SpO2 100% Physical Exam  Constitutional: He is oriented to person, place,  and time. He appears well-developed and well-nourished. No distress.  HENT:  Head: Normocephalic and atraumatic. Head is without raccoon's eyes, without Battle's sign, without abrasion, without contusion and without laceration.  Right Ear: Tympanic membrane normal. No hemotympanum.  Left Ear: Tympanic membrane normal. No hemotympanum.  Nose: Nose normal.  Mouth/Throat: Uvula is midline, oropharynx is clear and moist and mucous membranes are normal. No oropharyngeal exudate.  Eyes: Conjunctivae and EOM are normal. Pupils are equal, round, and reactive to light. Right eye exhibits no  discharge. Left eye exhibits no discharge. No scleral icterus.  Neck: Normal range of motion. Neck supple.  Cardiovascular: Normal rate, regular rhythm, normal heart sounds and intact distal pulses.   Pulmonary/Chest: Effort normal and breath sounds normal. No respiratory distress. He has no wheezes. He has no rales. He exhibits no tenderness.  Abdominal: Soft. Bowel sounds are normal. He exhibits no distension and no mass. There is no tenderness. There is no rebound and no guarding.  Musculoskeletal: Normal range of motion. He exhibits no edema.  Midline lumbar tenderness. No midline cervical or thoracic tenderness. FROM of neck and back. Positive left straight leg raise. Patient able to stand and ambulate without any assistance, no ataxia noted.  TTP over left lateral hip, no pelvic instability. Dec ROM and strength of left hip due to reported pain, otherwise right hip, bilateral knees, ankles and feet with FROM and 5/5 strength. Sensation intact. 2+ PT pulses. Cap refill <2. No deformity, swelling, ecchymoses, erythema or warmth noted.   Lymphadenopathy:    He has no cervical adenopathy.  Neurological: He is alert and oriented to person, place, and time. He has normal reflexes. No cranial nerve deficit or sensory deficit. Coordination and gait normal.  No saddle anesthesia.  Skin: Skin is warm and dry. He is not  diaphoretic.  Nursing note and vitals reviewed.   ED Course  Procedures (including critical care time) Labs Review Labs Reviewed - No data to display  Imaging Review Dg Lumbar Spine Complete  12/03/2015  CLINICAL DATA:  70 year old male with left hip and lumbar spine pain after falling EXAM: LUMBAR SPINE - COMPLETE 4+ VIEW COMPARISON:  Concurrently obtained radiographs of the pelvis and left hip; prior lumbar spine MRI 08/18/2015 FINDINGS: There is no evidence of lumbar spine fracture. Alignment is normal. Intervertebral disc spaces are maintained. L5-S1 degenerative disc disease. Lower lumbar facet arthropathy at L4-L5 and L5-S1. Stable mild grade 1 anterolisthesis of L4 on L5 without significant interval change compared to prior MRI imaging. IMPRESSION: 1. No acute fracture or malalignment. 2. Stable mild lower lumbar degenerative disc disease and facet arthropathy comparing across modalities to the prior MRI from October 2016. Electronically Signed   By: Jacqulynn Cadet M.D.   On: 12/03/2015 22:26   Dg Hip Unilat With Pelvis 1v Left  12/03/2015  CLINICAL DATA:  70 year old male with lumbar spine and left hip pain after falling EXAM: DG HIP (WITH OR WITHOUT PELVIS) 1V*L* COMPARISON:  Prior radiographs of the pelvis and left hip 02/23/2015 FINDINGS: There is no evidence of hip fracture or dislocation. Stable left slightly greater than right hip joint osteoarthritis. IMPRESSION: 1. No acute fracture or malalignment. 2. 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 Electronically Signed   By: Jacqulynn Cadet M.D.   On: 12/03/2015 22:28   I have personally reviewed and evaluated these images and lab results as part of my medical decision-making.  Filed Vitals:   12/03/15 2027 12/03/15 2241  BP: 133/78 134/85  Pulse: 76 62  Temp: 97.9 F (36.6 C) 97.5 F (36.4 C)  Resp: 20 19     MDM   Final diagnoses:  Fall, initial  encounter  Midline low back pain with left-sided sciatica   Patient presents with worsening chronic lower back pain/sciatica. Reports having a fall earlier today due to his left leg "giving out on him due to pain". Denies head injury or LOC. No back pain red flags. VSS. Exam revealed lumbar midline tenderness,  tenderness over left hip and dec ROM due to reported pain, pt able to stand and ambulate, BLE otherwise neurovascularly intact. Pt given one dose of oxycodone in the ED.   Lumbar xray showed no acute fx or malalignment, stable mild lower lumbar degenerative disc disease. Left hip with pelvis xray showed no acute fx or malalignment, left with slightly greater joint hip osteoarthritis. On reevaluation, pt reports his pain has improved. I suspect pt's sxs are likely due to his chronic back pain associated with degenerative disc disease/sciatica. I do not suspect epidural abscess, cord compression/cauda equina syndrome. Plan to d/c pt home with pain meds and advised pt to follow up with PCP.   Evaluation does not show pathology requring ongoing emergent intervention or admission. Pt is hemodynamically stable and mentating appropriately. Discussed findings/results and plan with patient/guardian, who agrees with plan. All questions answered. Return precautions discussed and outpatient follow up given.      Chesley Noon Parker, [place] 12/03/15 2330  Davonna Belling, MD 12/04/15 Dyann Kief

## 2015-12-10 ENCOUNTER — Encounter (HOSPITAL_COMMUNITY): Payer: Self-pay | Admitting: *Deleted

## 2015-12-10 ENCOUNTER — Emergency Department (HOSPITAL_COMMUNITY)
Admission: EM | Admit: 2015-12-10 | Discharge: 2015-12-10 | Disposition: A | Discharge status: Home / Self Care | Payer: Medicare Other | Attending: Emergency Medicine | Admitting: Emergency Medicine

## 2015-12-10 DIAGNOSIS — F17210 Nicotine dependence, cigarettes, uncomplicated: Secondary | ICD-10-CM | POA: Insufficient documentation

## 2015-12-10 DIAGNOSIS — G43909 Migraine, unspecified, not intractable, without status migrainosus: Secondary | ICD-10-CM | POA: Diagnosis not present

## 2015-12-10 DIAGNOSIS — J441 Chronic obstructive pulmonary disease with (acute) exacerbation: Secondary | ICD-10-CM | POA: Insufficient documentation

## 2015-12-10 DIAGNOSIS — I10 Essential (primary) hypertension: Secondary | ICD-10-CM | POA: Diagnosis not present

## 2015-12-10 DIAGNOSIS — I509 Heart failure, unspecified: Secondary | ICD-10-CM | POA: Diagnosis not present

## 2015-12-10 DIAGNOSIS — M545 Low back pain: Secondary | ICD-10-CM | POA: Diagnosis present

## 2015-12-10 DIAGNOSIS — M5442 Lumbago with sciatica, left side: Secondary | ICD-10-CM | POA: Insufficient documentation

## 2015-12-10 DIAGNOSIS — M1990 Unspecified osteoarthritis, unspecified site: Secondary | ICD-10-CM | POA: Diagnosis not present

## 2015-12-10 DIAGNOSIS — I481 Persistent atrial fibrillation: Secondary | ICD-10-CM | POA: Diagnosis not present

## 2015-12-10 DIAGNOSIS — M79605 Pain in left leg: Secondary | ICD-10-CM | POA: Diagnosis not present

## 2015-12-10 DIAGNOSIS — Z79899 Other long term (current) drug therapy: Secondary | ICD-10-CM | POA: Insufficient documentation

## 2015-12-10 DIAGNOSIS — R109 Unspecified abdominal pain: Secondary | ICD-10-CM | POA: Diagnosis not present

## 2015-12-10 DIAGNOSIS — K219 Gastro-esophageal reflux disease without esophagitis: Secondary | ICD-10-CM | POA: Insufficient documentation

## 2015-12-10 DIAGNOSIS — F419 Anxiety disorder, unspecified: Secondary | ICD-10-CM | POA: Insufficient documentation

## 2015-12-10 DIAGNOSIS — Z85038 Personal history of other malignant neoplasm of large intestine: Secondary | ICD-10-CM | POA: Insufficient documentation

## 2015-12-10 DIAGNOSIS — Z8619 Personal history of other infectious and parasitic diseases: Secondary | ICD-10-CM | POA: Insufficient documentation

## 2015-12-10 DIAGNOSIS — I2510 Atherosclerotic heart disease of native coronary artery without angina pectoris: Secondary | ICD-10-CM | POA: Insufficient documentation

## 2015-12-10 DIAGNOSIS — Z9889 Other specified postprocedural states: Secondary | ICD-10-CM | POA: Diagnosis not present

## 2015-12-10 DIAGNOSIS — G8929 Other chronic pain: Secondary | ICD-10-CM

## 2015-12-10 DIAGNOSIS — Z7901 Long term (current) use of anticoagulants: Secondary | ICD-10-CM | POA: Insufficient documentation

## 2015-12-10 MED ORDER — PREDNISONE 20 MG PO TABS: ORAL_TABLET | ORAL | Status: DC

## 2015-12-10 MED ORDER — DEXAMETHASONE SODIUM PHOSPHATE 10 MG/ML IJ SOLN
10.0000 mg | Freq: Once | INTRAMUSCULAR | Status: AC
  Administered 2015-12-10: 10 mg via INTRAMUSCULAR
  Filled 2015-12-10: qty 1

## 2015-12-10 NOTE — ED Provider Notes (Signed)
CSN: [id number]     Arrival date & time 12/10/15  2053 History  By signing my name below, I, Paul Clayton, attest that this documentation has been prepared under the direction and in the presence of non-physician practitioner, Clayton Bibles, PA-C. Electronically Signed: Dora Clayton, Scribe. 12/10/2015. 9:25 PM.    Chief Complaint  Patient presents with  . Leg Pain    The history is provided by the patient. No language interpreter was used.     HPI Comments: Paul Clayton is a 70 y.o. male brought in by EMS with h/o sciatica, arthritis, CHF, A-Fib, and COPD who presents to the Emergency Department complaining of gradual onset, sharp, worsening, severe, left leg pain for the last few months. He states that his entire left side has been in severe pain 4 separate times since Christmas 2016. Pt reports pain from his left lower back all the way into his left posterior knee. Pt states that he is able to move his left leg and ambulate but his left leg frequently "gives out" when he ambulates; he states that he often has to stop while he is ambulating because of pain. He notes that cold weather worsens his pain. He states that he has had difficulty controlling his urine for the last three months but can feel the need to urinate - notes this began before his last MRI. He states that he purchased a 16 oz beer at 430 PM today due to his left knee pain and experienced moderate relief after drinking it. Pt reports that he fell at 730 PM tonight after his left leg gave out but did not hurt anything. Pt is from [place] but has been in [place] since 2015. Pt reports that he currently lives at a shelter. He states that he will be returning to [place] on March 1 and will have all of his medical records transferred; he notes that he has seen doctors in [place] during his visits home. He states that his last written prescription was three weeks ago for Oxycodone. He states that he has been trying to get into pain  management. His PCP is Dr. Berkley Harvey with [facility]. Pt had an MRI on 08/18/15 and reports that he has had another MRI since then. Pt notes that he has been a drug addict for 20+ years, but stopped shooting up drugs 15 years ago. He notes that he occasionally wheezes at night. He denies fever, shaking, chills, chest pain, SOB, or any other associated symptoms at this time.   Was seen in ED 12/03/15 for similar pain and had unremarkable xray.  Denies any change in his chronic back pain/sciatica.      Past Medical History  Diagnosis Date  . Persistent atrial fibrillation (HCC)     a. s/p DCCV; b. Amiodarone  . Hypertension   . Chronic systolic CHF (congestive heart failure) (HCC)     a. EF previously 15%; b. Echo 7/16:  EF 30-35%, diff HK, gr 1 DD, mild MR, mild LAE, mild reduced RVSF  . NICM (nonischemic cardiomyopathy) ([place])     no obs CAD on LHC in 1/16 - ? HTN or substance abuse  . CAD (coronary artery disease)     a. LHC 1/16:  LM 20%, oLAD 30%, oLCx 30%, pRCA 30%  . Adenocarcinoma of colon ([place])     STAGE 1  . Substance abuse     ETOH; crack cocaine  . COPD (chronic obstructive pulmonary disease) ([place])   . Tobacco abuse   .  GERD (gastroesophageal reflux disease)   . Hepatitis C   . Migraine     "last one was back in the 1980's" (04/19/2015)  . Arthritis     "left hip" (04/19/2015)  . Chronic lower back pain   . Anxiety   . Depression    Past Surgical History  Procedure Laterality Date  . Cardioversion      "I've had 2 in all" (04/19/2015)  . Colonoscopy    . Left and right heart catheterization with coronary angiogram N/A 10/23/2014    Procedure: LEFT AND RIGHT HEART CATHETERIZATION WITH CORONARY ANGIOGRAM;  Surgeon: Larey Dresser, MD;  Location: [facility] CATH LAB;  Service: Cardiovascular;  Laterality: N/A;  . Colonoscopy with propofol N/A 11/30/2014    Procedure: COLONOSCOPY WITH PROPOFOL;  Surgeon: Milus Banister, MD;  Location: [facility] ENDOSCOPY;  Service: Endoscopy;   Laterality: N/A;  . Cardioversion N/A 01/30/2015    Procedure: CARDIOVERSION;  Surgeon: Larey Dresser, MD;  Location: [facility] ENDOSCOPY;  Service: Cardiovascular;  Laterality: N/A;  . Cardiac catheterization  10/23/2014   Family History  Problem Relation Age of Onset  . Hypertension Mother    Social History  Substance Use Topics  . Smoking status: Light Tobacco Smoker -- 0.10 packs/day for 52 years    Types: Cigarettes    Last Attempt to Quit: 09/01/2014  . Smokeless tobacco: Never Used     Comment: 04/19/2015 "probably smoke 1 pack cigarettes/month"  . Alcohol Use: 0.0 oz/week    0 Standard drinks or equivalent per week     Comment: 04/19/2015 "I might drink a 40oz beer/month"    Review of Systems  Constitutional: Negative for fever and chills.  Respiratory: Positive for wheezing. Negative for shortness of breath.   Cardiovascular: Negative for chest pain.  Gastrointestinal: Positive for abdominal pain (left).  Musculoskeletal: Positive for joint swelling and arthralgias.  Neurological: Negative for tremors.  All other systems reviewed and are negative.     Allergies  Review of patient's allergies indicates no known allergies.  Home Medications   Prior to Admission medications   Medication Sig Start Date End Date Taking? Authorizing Provider  acetaminophen (TYLENOL) 500 MG tablet Take 1 tablet (500 mg total) by mouth every 6 (six) hours as needed. 08/18/15   Marella Chimes, PA-C  amiodarone (PACERONE) 200 MG tablet Take 1 tablet (200 mg total) by mouth daily. 08/20/15   Larey Dresser, MD  atorvastatin (LIPITOR) 20 MG tablet Take 1 tablet (20 mg total) by mouth daily. 08/20/15   Larey Dresser, MD  baclofen (LIORESAL) 10 MG tablet Take 10 mg by mouth daily as needed for muscle spasms (pain).    Historical Provider, MD  carvedilol (COREG) 3.125 MG tablet TAKE 1 TABLET (3.125 MG TOTAL) BY MOUTH TWO   (TWO) TIMES DAILY WITH A MEAL. 08/20/15   Larey Dresser, MD  furosemide  (LASIX) 40 MG tablet TAKE 1 TABLET BY MOUTH   DAILY 05/16/15   Larey Dresser, MD  gabapentin (NEURONTIN) 300 MG capsule TAKE 1 CAPSULE (300 MG TOTAL) BY MOUTH THREE   (THREE) TIMES DAILY. 11/01/15   Larey Dresser, MD  losartan (COZAAR) 25 MG tablet Take 1 tablet (25 mg total) by mouth daily. 08/23/15   Larey Dresser, MD  mometasone-formoterol [facility]) 200-5 MCG/ACT AERO Inhale 2 puffs into the lungs 2 (two) times daily as needed for wheezing. Patient taking differently: Inhale 2 puffs into the lungs 2 (two) times daily as needed for  wheezing.  01/06/15   Leone Brand, MD  oxyCODONE-acetaminophen (PERCOCET/ROXICET) 5-325 MG tablet Take 1 tablet by mouth every 4 (four) hours as needed for severe pain. 12/03/15   Nona Dell, PA-C  pantoprazole (PROTONIX) 40 MG tablet Take 1 tablet (40 mg total) by mouth daily. 08/20/15   Larey Dresser, MD  predniSONE (DELTASONE) 20 MG tablet 3 tabs po daily x 3 days, then 2 tabs x 3 days, then 1.5 tabs x 3 days, then 1 tab x 3 days, then 0.5 tabs x 3 days 11/27/15   [name], MD  rivaroxaban (XARELTO) 20 MG TABS tablet TAKE ONE TABLET   BY MOUTH   DAILY 08/20/15   Larey Dresser, MD  spironolactone (ALDACTONE) 25 MG tablet Take 1 tablet (25 mg total) by mouth daily. 08/20/15   Larey Dresser, MD  Umeclidinium Bromide (INCRUSE ELLIPTA) 62.5 MCG/INH AEPB Inhale 1 Inhaler into the lungs daily. 10/06/15   Olam Idler, MD   BP 113/55 mmHg  Pulse 103  Temp(Src) 97.6 F (36.4 C) (Oral)  Resp 16  SpO2 99% Physical Exam  Constitutional: He appears well-developed and well-nourished. No distress.  HENT:  Head: Normocephalic and atraumatic.  Neck: Neck supple.  Pulmonary/Chest: Effort normal.  Abdominal: Soft. He exhibits no distension. There is no tenderness. There is no rebound and no guarding.  Musculoskeletal: Normal range of motion. He exhibits no edema.  Spine nontender, no crepitus, or stepoffs. Lower extremities:  Strength 5/5,  sensation intact, distal pulses intact.     Neurological: He is alert. He exhibits normal muscle tone.  Gait is normal.    Skin: He is not diaphoretic. No erythema.  Psychiatric: He has a normal mood and affect. His behavior is normal.  Nursing note and vitals reviewed.   ED Course  Procedures (including critical care time)  DIAGNOSTIC STUDIES: Oxygen Saturation is 99% on RA, normal by my interpretation.    COORDINATION OF CARE: 9:26 PM Will administer Decadron injection 10 mg. Discussed treatment plan with pt at bedside and pt agreed to plan.   Labs Review Labs Reviewed - No data to display  Imaging Review No results found.    EKG Interpretation None       DEA database shows #120 oxycodone 10mg  tabs prescribed 11/09/15 and #30 tramadol 50mg  11/21/15 - prescribed and filled in [place], [place].    MDM   Final diagnoses:  Chronic left-sided low back pain with left-sided sciatica    Afebrile, nontoxic patient with exacerbation of chronic back pain.  No red flags with history or exam.  Neurovascularly intact.  Gait is completely normal.  Emergent imaging not indicated at this time.   D/C home with steroids, follow up in [place] when he moves back next week.  [place] shows recent oxycodone prescription came from [place] with spine practice.  Discussed result, findings, treatment, and follow up  with patient.  Pt given return precautions.  Pt verbalizes understanding and agrees with plan.         I personally performed the services described in this documentation, which was scribed in my presence. The recorded information has been reviewed and is accurate.      Clayton Bibles, PA-C 12/10/15 2252  Daleen Bo, MD 12/11/15 [phone]

## 2015-12-10 NOTE — ED Notes (Signed)
Pt in c/o continued left leg pain, recently seen for same, also lower back pain

## 2015-12-10 NOTE — ED Notes (Signed)
Per [facility], pt here for left leg pain. Has a hx of sciatica and out of pain medications.

## 2015-12-10 NOTE — Discharge Instructions (Signed)
Read the information below.  You may return to the Emergency Department at any time for worsening condition or any new symptoms that concern you.   If you develop fevers, loss of control of bowel or bladder, weakness or numbness in your legs, or are unable to walk, return to the ER for a recheck.  Please follow up with your doctors in [place] as soon as you move back home.    Chronic Back Pain  When back pain lasts longer than 3 months, it is called chronic back pain.People with chronic back pain often go through certain periods that are more intense (flare-ups).  CAUSES Chronic back pain can be caused by wear and tear (degeneration) on different structures in your back. These structures include:  The bones of your spine (vertebrae) and the joints surrounding your spinal cord and nerve roots (facets).  The strong, fibrous tissues that connect your vertebrae (ligaments). Degeneration of these structures may result in pressure on your nerves. This can lead to constant pain. HOME CARE INSTRUCTIONS  Avoid bending, heavy lifting, prolonged sitting, and activities which make the problem worse.  Take brief periods of rest throughout the day to reduce your pain. Lying down or standing usually is better than sitting while you are resting.  Take over-the-counter or prescription medicines only as directed by your caregiver. SEEK IMMEDIATE MEDICAL CARE IF:   You have weakness or numbness in one of your legs or feet.  You have trouble controlling your bladder or bowels.  You have nausea, vomiting, abdominal pain, shortness of breath, or fainting.   This information is not intended to replace advice given to you by your health care provider. Make sure you discuss any questions you have with your health care provider.   Document Released: 11/13/2004 Document Revised: 12/29/2011 Document Reviewed: 03/26/2015 Elsevier Interactive Patient Education [employer].

## 2015-12-17 ENCOUNTER — Other Ambulatory Visit: Payer: Self-pay | Admitting: Cardiology

## 2015-12-20 ENCOUNTER — Encounter (HOSPITAL_COMMUNITY): Payer: Self-pay | Admitting: Emergency Medicine

## 2015-12-20 ENCOUNTER — Emergency Department (HOSPITAL_COMMUNITY)
Admission: EM | Admit: 2015-12-20 | Discharge: 2015-12-20 | Disposition: A | Discharge status: Home / Self Care | Payer: Medicare Other | Attending: Emergency Medicine | Admitting: Emergency Medicine

## 2015-12-20 DIAGNOSIS — W1839XA Other fall on same level, initial encounter: Secondary | ICD-10-CM | POA: Insufficient documentation

## 2015-12-20 DIAGNOSIS — M5432 Sciatica, left side: Secondary | ICD-10-CM | POA: Diagnosis not present

## 2015-12-20 DIAGNOSIS — Y92521 Bus station as the place of occurrence of the external cause: Secondary | ICD-10-CM | POA: Insufficient documentation

## 2015-12-20 DIAGNOSIS — K219 Gastro-esophageal reflux disease without esophagitis: Secondary | ICD-10-CM | POA: Insufficient documentation

## 2015-12-20 DIAGNOSIS — R52 Pain, unspecified: Secondary | ICD-10-CM | POA: Diagnosis not present

## 2015-12-20 DIAGNOSIS — I481 Persistent atrial fibrillation: Secondary | ICD-10-CM | POA: Insufficient documentation

## 2015-12-20 DIAGNOSIS — I2510 Atherosclerotic heart disease of native coronary artery without angina pectoris: Secondary | ICD-10-CM | POA: Insufficient documentation

## 2015-12-20 DIAGNOSIS — Z8619 Personal history of other infectious and parasitic diseases: Secondary | ICD-10-CM | POA: Insufficient documentation

## 2015-12-20 DIAGNOSIS — Z85038 Personal history of other malignant neoplasm of large intestine: Secondary | ICD-10-CM | POA: Insufficient documentation

## 2015-12-20 DIAGNOSIS — Z79899 Other long term (current) drug therapy: Secondary | ICD-10-CM | POA: Insufficient documentation

## 2015-12-20 DIAGNOSIS — F17210 Nicotine dependence, cigarettes, uncomplicated: Secondary | ICD-10-CM | POA: Insufficient documentation

## 2015-12-20 DIAGNOSIS — Z9889 Other specified postprocedural states: Secondary | ICD-10-CM | POA: Diagnosis not present

## 2015-12-20 DIAGNOSIS — I10 Essential (primary) hypertension: Secondary | ICD-10-CM | POA: Diagnosis not present

## 2015-12-20 DIAGNOSIS — M5442 Lumbago with sciatica, left side: Secondary | ICD-10-CM | POA: Diagnosis not present

## 2015-12-20 DIAGNOSIS — G43909 Migraine, unspecified, not intractable, without status migrainosus: Secondary | ICD-10-CM | POA: Insufficient documentation

## 2015-12-20 DIAGNOSIS — M1612 Unilateral primary osteoarthritis, left hip: Secondary | ICD-10-CM | POA: Diagnosis not present

## 2015-12-20 DIAGNOSIS — Z7952 Long term (current) use of systemic steroids: Secondary | ICD-10-CM | POA: Insufficient documentation

## 2015-12-20 DIAGNOSIS — F419 Anxiety disorder, unspecified: Secondary | ICD-10-CM | POA: Insufficient documentation

## 2015-12-20 DIAGNOSIS — Y9389 Activity, other specified: Secondary | ICD-10-CM | POA: Insufficient documentation

## 2015-12-20 DIAGNOSIS — S79922A Unspecified injury of left thigh, initial encounter: Secondary | ICD-10-CM | POA: Diagnosis not present

## 2015-12-20 DIAGNOSIS — S3992XA Unspecified injury of lower back, initial encounter: Secondary | ICD-10-CM | POA: Insufficient documentation

## 2015-12-20 DIAGNOSIS — Z7901 Long term (current) use of anticoagulants: Secondary | ICD-10-CM | POA: Diagnosis not present

## 2015-12-20 DIAGNOSIS — M545 Low back pain: Secondary | ICD-10-CM | POA: Diagnosis not present

## 2015-12-20 DIAGNOSIS — Y998 Other external cause status: Secondary | ICD-10-CM | POA: Diagnosis not present

## 2015-12-20 DIAGNOSIS — J449 Chronic obstructive pulmonary disease, unspecified: Secondary | ICD-10-CM | POA: Diagnosis not present

## 2015-12-20 DIAGNOSIS — I5022 Chronic systolic (congestive) heart failure: Secondary | ICD-10-CM | POA: Insufficient documentation

## 2015-12-20 DIAGNOSIS — G8929 Other chronic pain: Secondary | ICD-10-CM | POA: Diagnosis not present

## 2015-12-20 DIAGNOSIS — F329 Major depressive disorder, single episode, unspecified: Secondary | ICD-10-CM | POA: Diagnosis not present

## 2015-12-20 MED ORDER — ACETAMINOPHEN 325 MG PO TABS: 650.0000 mg | ORAL_TABLET | Freq: Four times a day (QID) | ORAL | Status: DC | PRN

## 2015-12-20 NOTE — ED Provider Notes (Signed)
CSN: [id number]     Arrival date & time 12/20/15  1323 History   First MD Initiated Contact with Patient 12/20/15 [phone]     Chief Complaint  Patient presents with  . Leg Pain     (Consider location/radiation/quality/duration/timing/severity/associated sxs/prior Treatment) HPI   Patient is a 70 year old male with past medical history of A. fib (on Xarelto), hypertension, CHF, CAD, COPD and arthritis who presents to the ED via EMS with complaint of left leg pain. Patient reports he has chronic pain in his left lower back radiating down his left leg due to sciatica but he notes his pain has worsened over the past few days. He notes today or he was standing at the bus stop his leg gave out on him resulting in him falling forward, however he notes that he was able to catch himself with his arms prior to falling on the ground. Denies head injury or LOC. Patient states his leg has given out on him multiple times in the past due to associated pain from sciatica. Patient reports prior to falling he needed to use the restroom but was unable to find a restroom because he was waiting at the bus stop. He notes during/after the fall he urinated due to being unable to hold his urine. He notes he has urinary frequency and frequently will be unable to hold his urine due to being on lasix. Patient notes he has only been taking gabapentin without relief and he notes that he is out of his oxycodone which she received at a prior ED visit for similar symptoms. Patient denies any other recent falls, trauma, injury. Pt denies fever, numbness, tingling, saddle anesthesia, abdominal pain, loss of bowel or bladder, urinary retention, weakness, IVDU, cancer or recent spinal manipulation.   Past Medical History  Diagnosis Date  . Persistent atrial fibrillation (HCC)     a. s/p DCCV; b. Amiodarone  . Hypertension   . Chronic systolic CHF (congestive heart failure) (HCC)     a. EF previously 15%; b. Echo 7/16:  EF 30-35%, diff  HK, gr 1 DD, mild MR, mild LAE, mild reduced RVSF  . NICM (nonischemic cardiomyopathy) ([place])     no obs CAD on LHC in 1/16 - ? HTN or substance abuse  . CAD (coronary artery disease)     a. LHC 1/16:  LM 20%, oLAD 30%, oLCx 30%, pRCA 30%  . Adenocarcinoma of colon ([place])     STAGE 1  . Substance abuse     ETOH; crack cocaine  . COPD (chronic obstructive pulmonary disease) ([place])   . Tobacco abuse   . GERD (gastroesophageal reflux disease)   . Hepatitis C   . Migraine     "last one was back in the 1980's" (04/19/2015)  . Arthritis     "left hip" (04/19/2015)  . Chronic lower back pain   . Anxiety   . Depression    Past Surgical History  Procedure Laterality Date  . Cardioversion      "I've had 2 in all" (04/19/2015)  . Colonoscopy    . Left and right heart catheterization with coronary angiogram N/A 10/23/2014    Procedure: LEFT AND RIGHT HEART CATHETERIZATION WITH CORONARY ANGIOGRAM;  Surgeon: Larey Dresser, MD;  Location: [facility] CATH LAB;  Service: Cardiovascular;  Laterality: N/A;  . Colonoscopy with propofol N/A 11/30/2014    Procedure: COLONOSCOPY WITH PROPOFOL;  Surgeon: Milus Banister, MD;  Location: [facility] ENDOSCOPY;  Service: Endoscopy;  Laterality: N/A;  .  Cardioversion N/A 01/30/2015    Procedure: CARDIOVERSION;  Surgeon: Larey Dresser, MD;  Location: [facility] ENDOSCOPY;  Service: Cardiovascular;  Laterality: N/A;  . Cardiac catheterization  10/23/2014   Family History  Problem Relation Age of Onset  . Hypertension Mother    Social History  Substance Use Topics  . Smoking status: Light Tobacco Smoker -- 0.10 packs/day for 52 years    Types: Cigarettes    Last Attempt to Quit: 09/01/2014  . Smokeless tobacco: Never Used     Comment: 04/19/2015 "probably smoke 1 pack cigarettes/month"  . Alcohol Use: 0.0 oz/week    0 Standard drinks or equivalent per week     Comment: 04/19/2015 "I might drink a 40oz beer/month"    Review of Systems  Musculoskeletal: Positive for back pain.        Left leg pain  All other systems reviewed and are negative.     Allergies  Review of patient's allergies indicates no known allergies.  Home Medications   Prior to Admission medications   Medication Sig Start Date End Date Taking? Authorizing Provider  amiodarone (PACERONE) 200 MG tablet Take 1 tablet (200 mg total) by mouth daily. 08/20/15  Yes Larey Dresser, MD  atorvastatin (LIPITOR) 20 MG tablet Take 1 tablet (20 mg total) by mouth daily. 08/20/15  Yes Larey Dresser, MD  carvedilol (COREG) 3.125 MG tablet TAKE 1 TABLET (3.125 MG TOTAL) BY MOUTH TWO TIMES DAILY WITH A MEAL. 12/17/15  Yes Larey Dresser, MD  carvedilol (COREG) 6.25 MG tablet Take 6.25 mg by mouth 2 (two) times daily. 11/21/15  Yes Historical Provider, MD  furosemide (LASIX) 40 MG tablet TAKE 1 TABLET BY MOUTH   DAILY Patient taking differently: TAKE40 MG BY MOUTH   DAILY 05/16/15  Yes Larey Dresser, MD  gabapentin (NEURONTIN) 300 MG capsule TAKE 1 CAPSULE (300 MG TOTAL) BY MOUTH THREE   (THREE) TIMES DAILY. 11/01/15  Yes Larey Dresser, MD  losartan (COZAAR) 25 MG tablet Take 1 tablet (25 mg total) by mouth daily. 08/23/15  Yes Larey Dresser, MD  pantoprazole (PROTONIX) 40 MG tablet Take 1 tablet (40 mg total) by mouth daily. 08/20/15  Yes Larey Dresser, MD  predniSONE (DELTASONE) 20 MG tablet 3 tabs po daily x 3 days, then 2 tabs x 3 days, then 1.5 tabs x 3 days, then 1 tab x 3 days, then 0.5 tabs x 3 days Patient taking differently: Take 20 mg by mouth daily with breakfast.  12/10/15  Yes Clayton Bibles, PA-C  spironolactone (ALDACTONE) 25 MG tablet Take 1 tablet (25 mg total) by mouth daily. 08/20/15  Yes Larey Dresser, MD  XARELTO 20 MG TABS tablet TAKE ONE TABLET   BY MOUTH   DAILY Patient taking differently: TAKE 20 MG   BY MOUTH   DAILY 12/17/15  Yes Larey Dresser, MD  acetaminophen (TYLENOL) 325 MG tablet Take 2 tablets (650 mg total) by mouth every 6 (six) hours as needed. 12/20/15   Chesley Noon [place],  PA-C  mometasone-formoterol (DULERA) 200-5 MCG/ACT AERO Inhale 2 puffs into the lungs 2 (two) times daily as needed for wheezing. Patient not taking: Reported on 12/20/2015 01/06/15   Leone Brand, MD  oxyCODONE-acetaminophen (PERCOCET/ROXICET) 5-325 MG tablet Take 1 tablet by mouth every 4 (four) hours as needed for severe pain. Patient not taking: Reported on 12/20/2015 12/03/15   Nona Dell, PA-C  Umeclidinium Bromide (INCRUSE ELLIPTA) 62.5 MCG/INH AEPB Inhale 1 Inhaler  into the lungs daily. Patient not taking: Reported on 12/20/2015 10/06/15   Olam Idler, MD   BP 122/84 mmHg  Pulse 87  Temp(Src) 98.3 F (36.8 C) (Oral)  Resp 18  SpO2 94% Physical Exam  Constitutional: He is oriented to person, place, and time. He appears well-developed and well-nourished. No distress.  HENT:  Head: Normocephalic and atraumatic.  Mouth/Throat: Oropharynx is clear and moist. No oropharyngeal exudate.  Eyes: Conjunctivae and EOM are normal. Pupils are equal, round, and reactive to light. Right eye exhibits no discharge. Left eye exhibits no discharge. No scleral icterus.  Neck: Normal range of motion. Neck supple.  Cardiovascular: Normal rate, regular rhythm, normal heart sounds and intact distal pulses.   Pulmonary/Chest: Effort normal and breath sounds normal. No respiratory distress. He has no wheezes. He has no rales. He exhibits no tenderness.  Abdominal: Soft. Bowel sounds are normal. He exhibits no distension and no mass. There is no tenderness. There is no rebound and no guarding.  Musculoskeletal: Normal range of motion. He exhibits tenderness. He exhibits no edema.  No midline C, T, or L tenderness. Mild TTP over left lumbar paraspinal muscles and left posterior thigh. Full range of motion of neck and back. Full range of motion of bilateral upper and lower extremities, with 5/5 strength. Sensation intact. 2+ DP and radial pulses. Cap refill <2 seconds. Patient able to stand and ambulate  without assistance.  Negative straight leg raise bilaterally.  Lymphadenopathy:    He has no cervical adenopathy.  Neurological: He is alert and oriented to person, place, and time. He has normal strength and normal reflexes. No sensory deficit. Gait normal.  Skin: Skin is warm and dry. He is not diaphoretic.  Nursing note and vitals reviewed.   ED Course  Procedures (including critical care time) Labs Review Labs Reviewed - No data to display  Imaging Review No results found. I have personally reviewed and evaluated these images and lab results as part of my medical decision-making.   EKG Interpretation None      MDM   Final diagnoses:  Sciatica of left side  Chronic pain    Patient presents with left leg pain consistent with his chronic sciatica. He note his leg gave out on him today (which he notes has happened multiple times in the past) but note he caught himself with his arms prior to falling. Denies head injury or LOC. Pt is on Xarelto for afib. No back pain red flags. VSS. Exam revealed tenderness over left lumbar paraspinal muscles and left posterior thigh, no midline tenderness, BLE neurovascularly intact.   Chart review shows that this is the patient's 4th visit in the ED over the past month for the same symptoms. MRI in 07/2015 showed spinal stenosis and foraminal narrowing at L3-4 and L5-S1. Pt's pain appears to be consistent with his chronic pain and I do not feel that any further workup or imaging is warranted at this time. I do not suspect spinal cord compression/cauda equina syndrome. Discussed results and plan for d/c with pt. Pt d/c home with tylenol and symptomatic tx. Advised pt to follow up with his PCP regarding management of his chronic pain.  Evaluation does not show pathology requring ongoing emergent intervention or admission. Pt is hemodynamically stable and mentating appropriately. Discussed findings/results and plan with patient/guardian, who agrees with  plan. All questions answered. Return precautions discussed and outpatient follow up given.    Chesley Noon [place], [place] 12/21/15 0014  Orlie Dakin,  MD 12/21/15 [phone]

## 2015-12-20 NOTE — ED Notes (Signed)
Patient states at end of triage that he fell approximately one hour ago, takes Xarelto for AFib and patient also reports loss of bladder with fall.

## 2015-12-20 NOTE — Discharge Instructions (Signed)
Take your medications as prescribed. Follow-up with your primary care provider in 3-4 days regarding further management of your chronic pain and sciatica. Please return to the Emergency Department if symptoms worsen or new onset of fever, numbness, tingling, groin anesthesia, loss of bowel or bladder, weakness.

## 2015-12-20 NOTE — ED Provider Notes (Signed)
Patient complains of left hip pain radiating to left leg onset 8 months ago, progressively worsening. States "both legs gave out for me today. He denies incontinence states sometimes he cannot walk fast enough to get to the bathroom. No fever. No injury. He did not fall to the ground today.. Patient had MRI of lumbar spine October 2016 showing spinal stenosis and foraminal narrowing at L3-L4 and L5-S1. On exam alert. Glasgow coma score 15 abdomen nondistended and nontender. Back without point tenderness. He has pain at left buttock radiating to left leg when ambulated. He walks with slight limp favoring left lower extremity. DTRs symmetric bilaterally at knee jerk and ankle jerks toes downward going bilaterally . Pain is chronic. Suggest Tylenol for pain. LFTs performed 7 months ago for essentially normal  Orlie Dakin, MD 12/20/15 1723

## 2015-12-20 NOTE — ED Notes (Addendum)
Patient presents for chronic leg pain r/t sciatic pain. No relief with prescription meds. Ambulatory with cane.

## 2015-12-21 IMAGING — DX DG CHEST 2V
2 series · 2 of 2 positions shown · non-contrast
Comparison: 09/08/2014.

CLINICAL DATA: Acute onset left-sided chest pain for 24 hr.
Productive cough. Short of breath. COPD.

EXAM:
CHEST  2 VIEW

[chest pa]
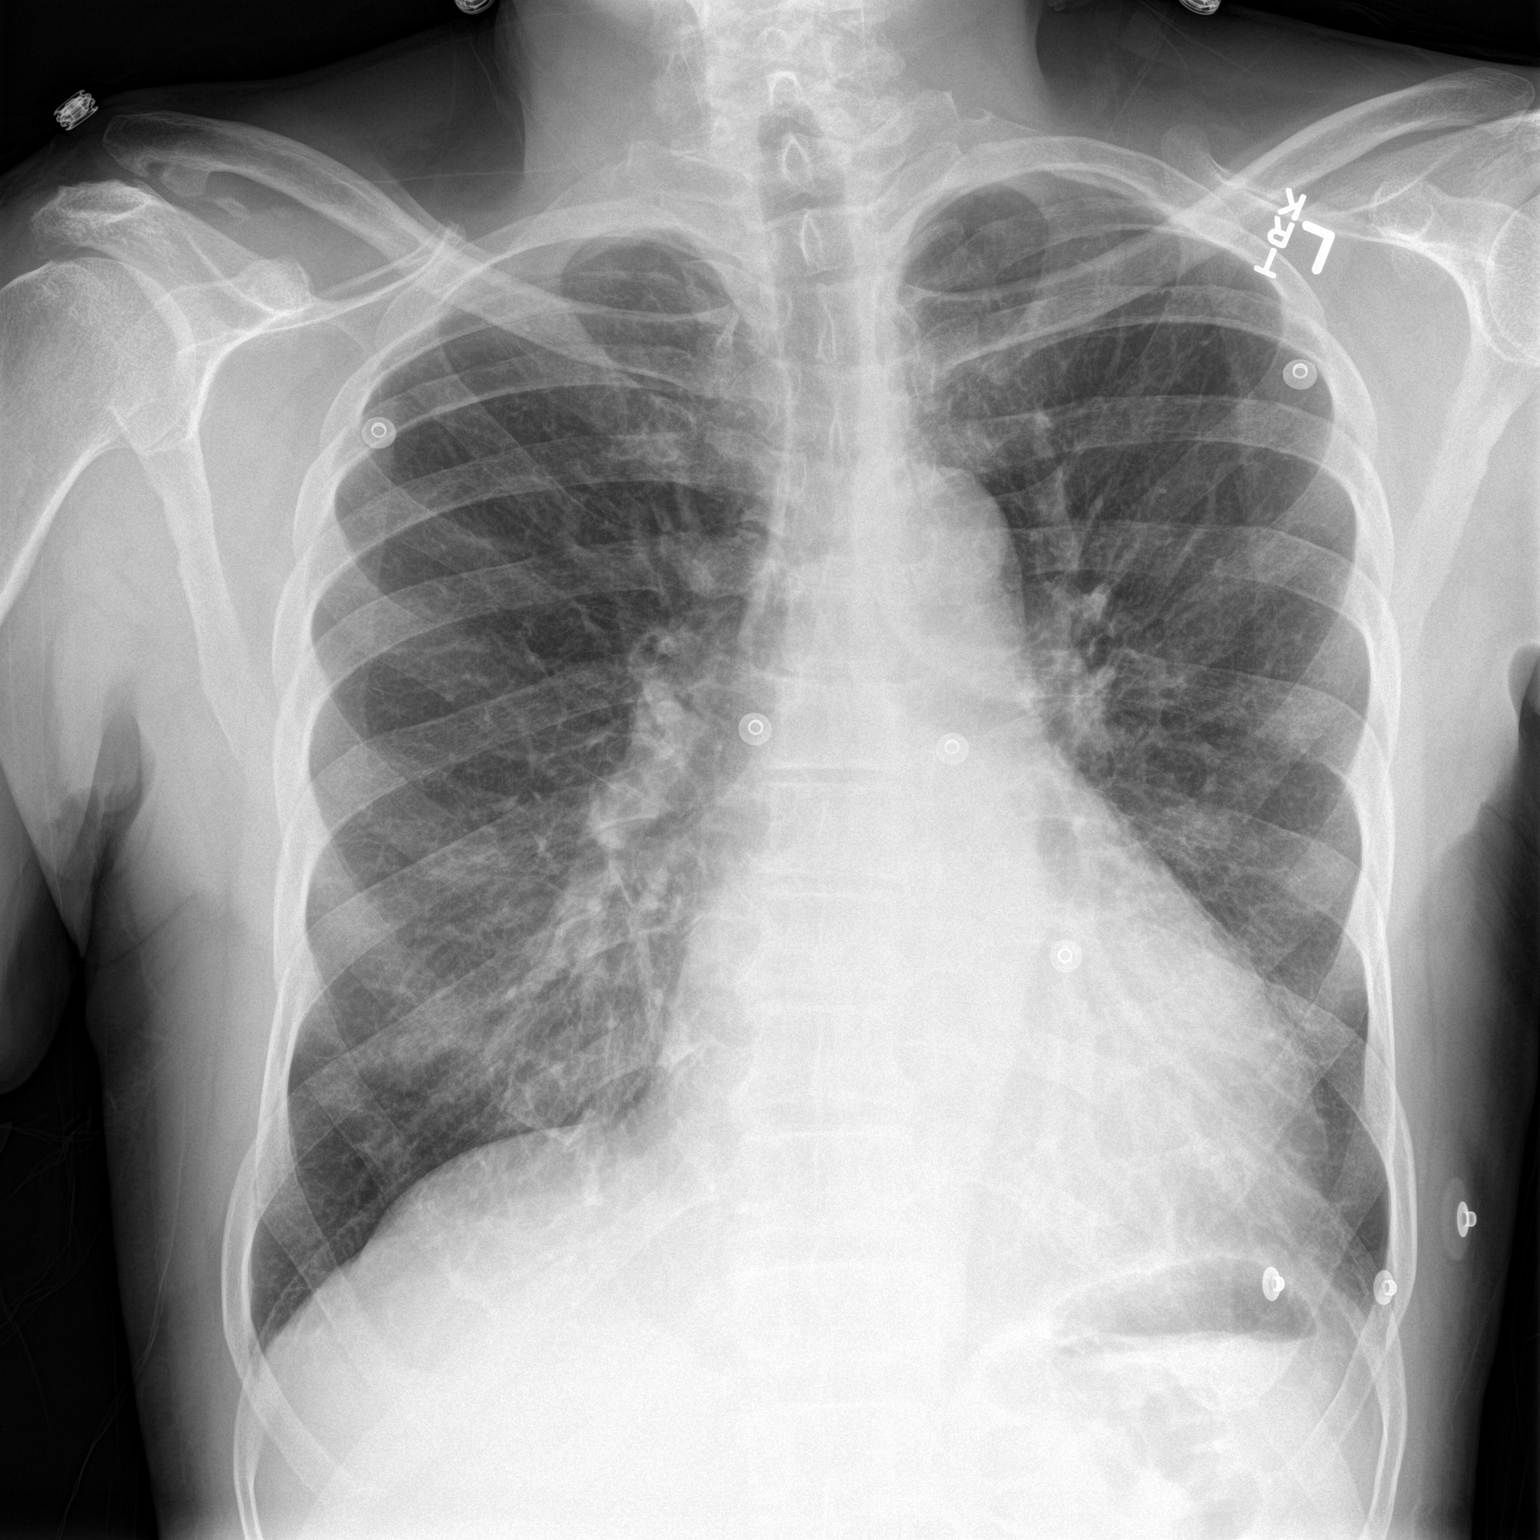

[chest lat]
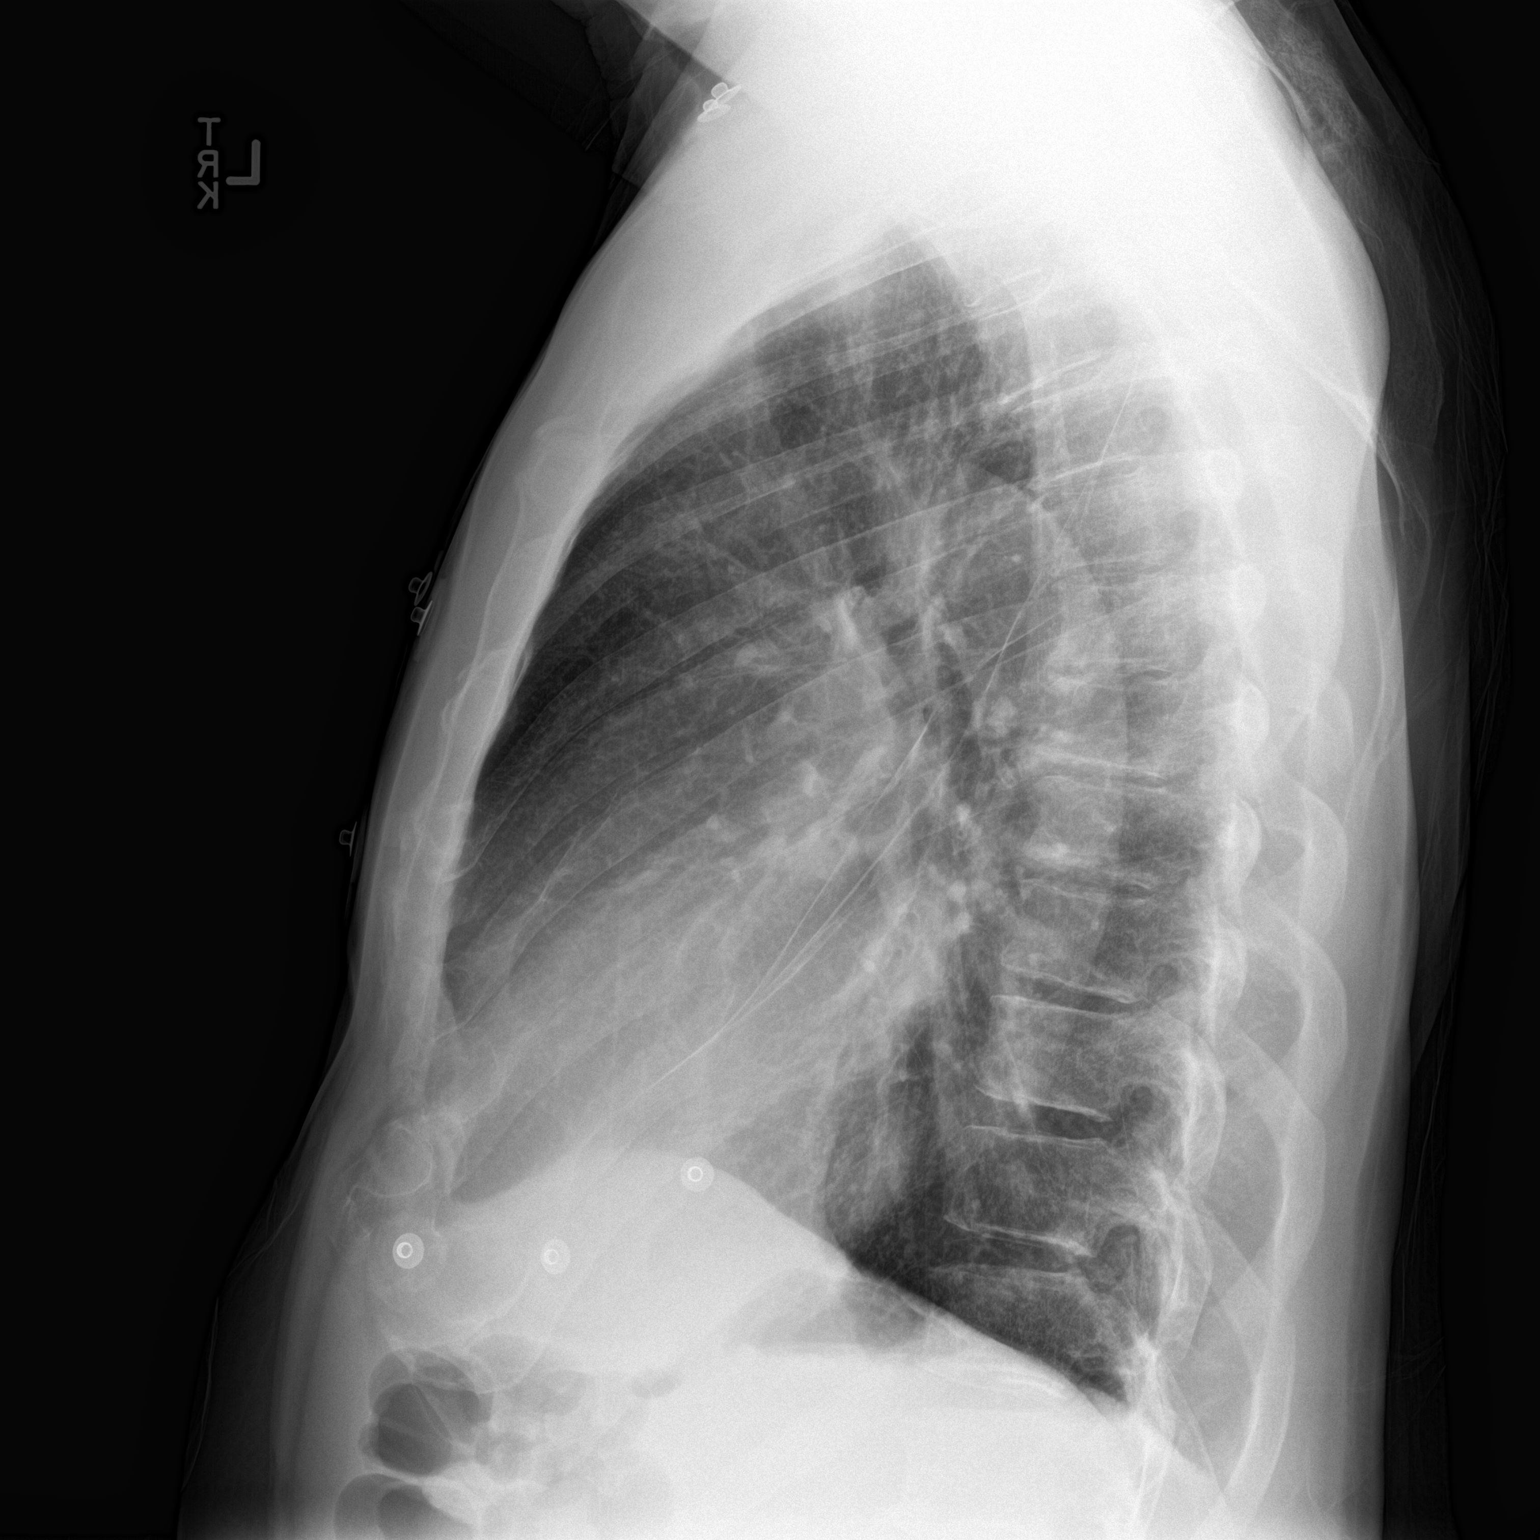

[2 of 2 positions shown; findings below may reference images not displayed]

FINDINGS: Emphysema and cardiomegaly are present. There is no airspace
consolidation. Diffuse prominence of the interstitium is probably
related to emphysema and pulmonary fibrosis. An element of
interstitial pulmonary edema cannot be excluded.

Chronic bronchitic changes are present. There is no focal
consolidation. No pleural effusion.
IMPRESSION: 1. Unchanged cardiomegaly.
2. Diffuse interstitial prominence is favored to represent pulmonary
fibrosis. Interstitial pulmonary edema could produce a similar
appearance however the stability compared to the prior exam makes
fibrosis more likely.
3. Chronic bronchitic changes and emphysema.

## 2015-12-24 ENCOUNTER — Encounter (HOSPITAL_COMMUNITY): Payer: Medicare Other

## 2015-12-24 IMAGING — CR DG CHEST 2V
2 series · 2 of 2 positions shown · non-contrast
Comparison: Chest radiograph September 13, 2014

CLINICAL DATA: Syncope while using the bathroom. Heart
palpitations. History of hypertension, CHF and COPD.

EXAM:
CHEST  2 VIEW

[chest pa]
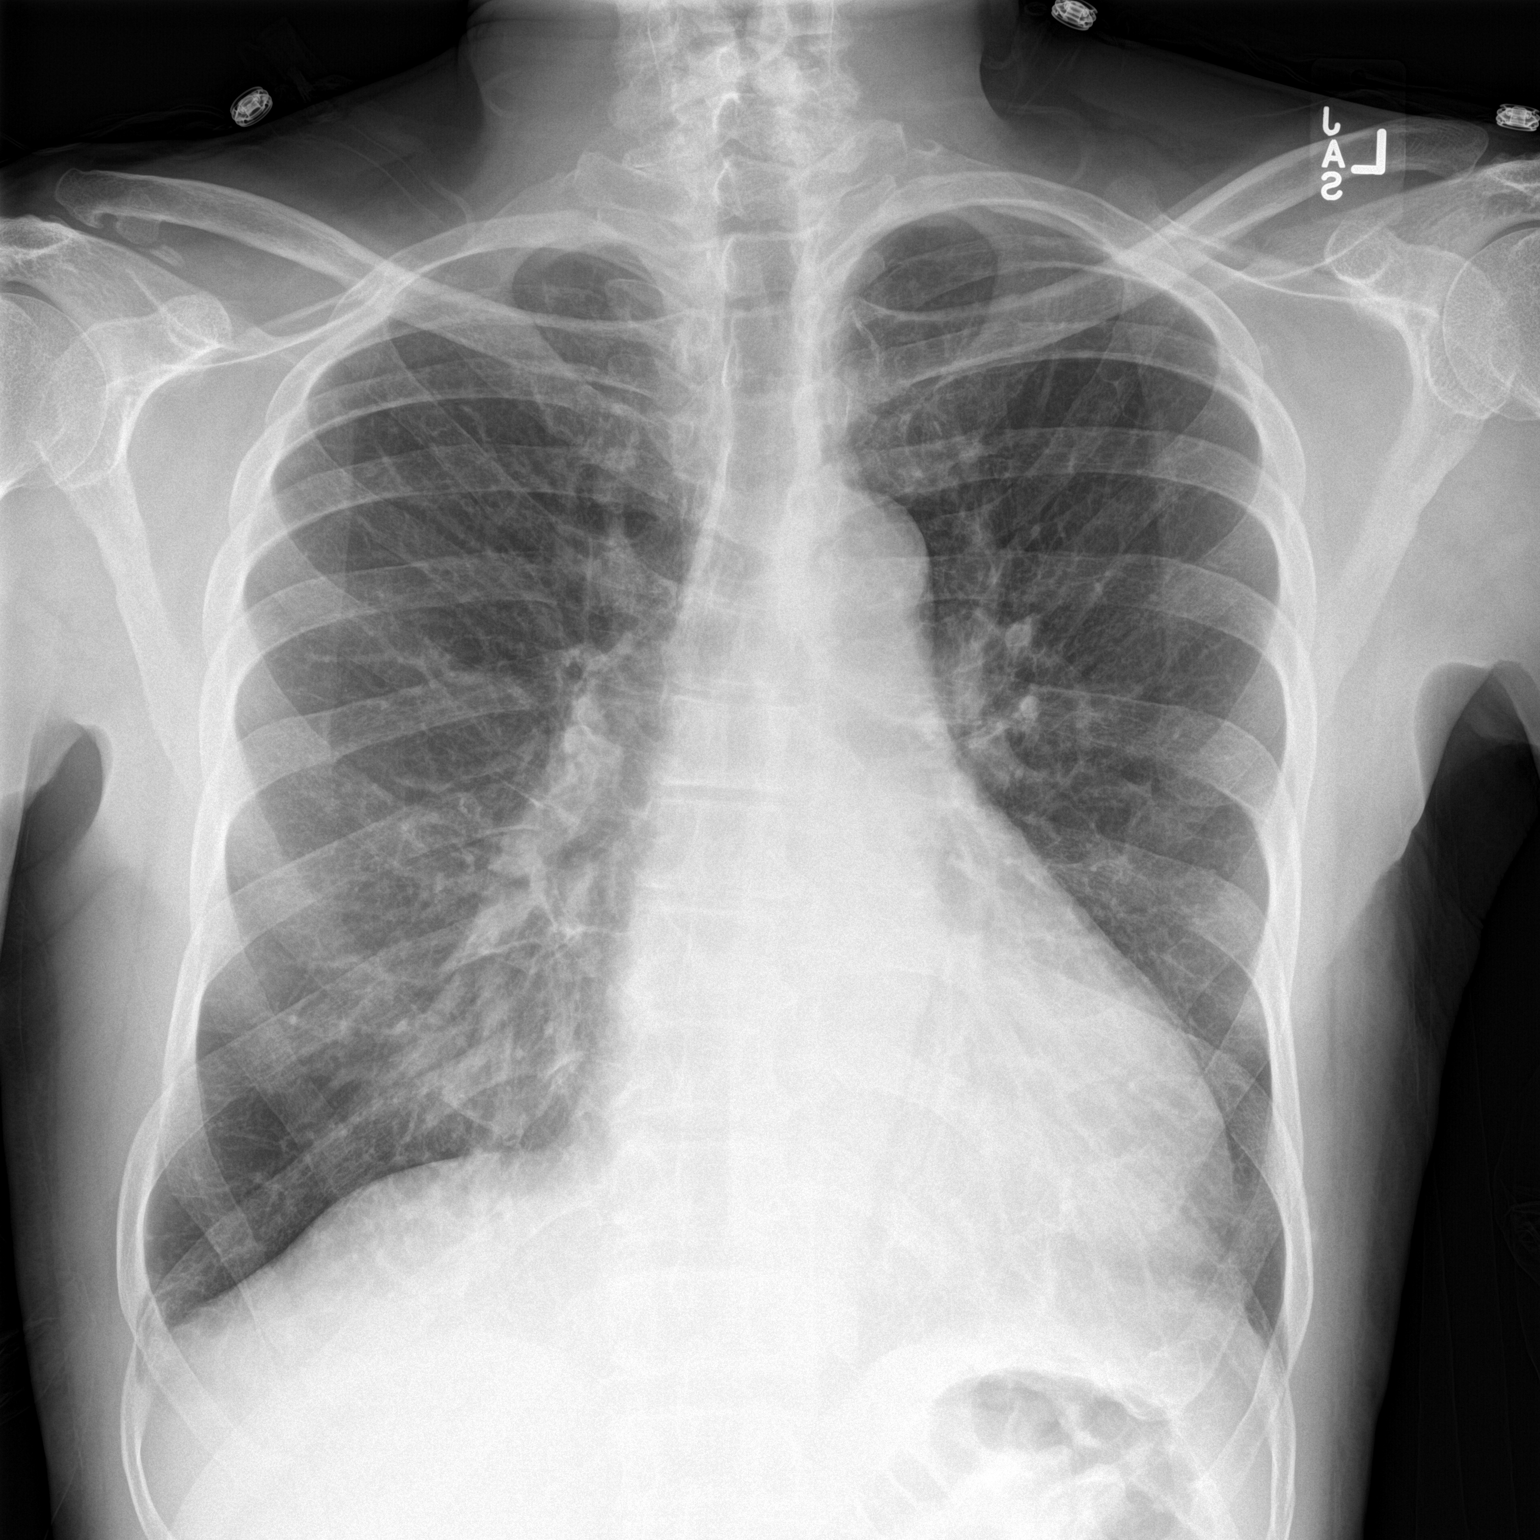

[chest lat]
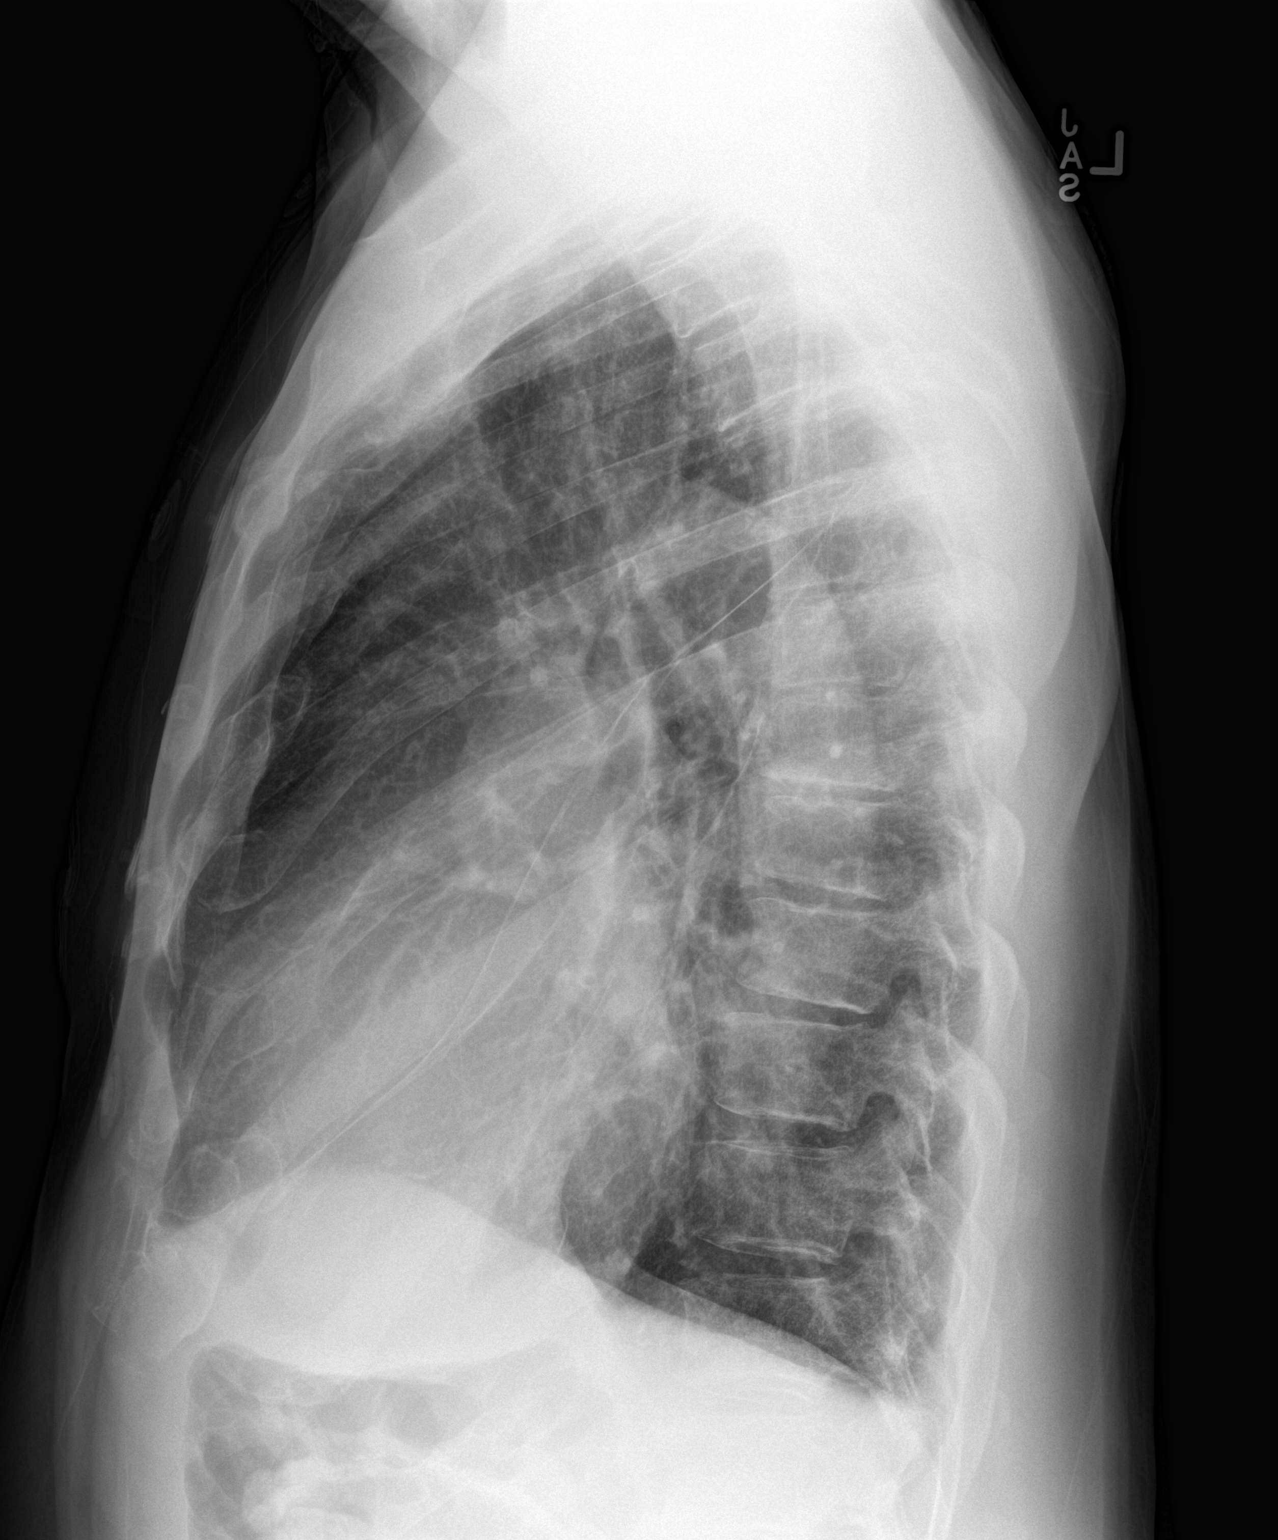

[2 of 2 positions shown; findings below may reference images not displayed]

FINDINGS: The cardiac silhouette remains similarly enlarged. Mildly calcified
aortic knob. Similar mild interstitial prominence with increased
lung volumes can be seen with COPD. Increased lung volumes. No
pleural effusions. No focal consolidation. No pneumothorax. Soft
tissue planes and included osseous structures are nonsuspicious
IMPRESSION: Stable cardiomegaly, no acute pulmonary process.

  By: Kiri Jim

## 2016-01-07 DIAGNOSIS — M25552 Pain in left hip: Secondary | ICD-10-CM | POA: Diagnosis not present

## 2016-01-07 DIAGNOSIS — M549 Dorsalgia, unspecified: Secondary | ICD-10-CM | POA: Diagnosis not present

## 2016-01-07 DIAGNOSIS — Z79899 Other long term (current) drug therapy: Secondary | ICD-10-CM | POA: Diagnosis not present

## 2016-01-07 DIAGNOSIS — M160 Bilateral primary osteoarthritis of hip: Secondary | ICD-10-CM | POA: Diagnosis not present

## 2016-01-07 DIAGNOSIS — J449 Chronic obstructive pulmonary disease, unspecified: Secondary | ICD-10-CM | POA: Diagnosis not present

## 2016-01-07 DIAGNOSIS — F17210 Nicotine dependence, cigarettes, uncomplicated: Secondary | ICD-10-CM | POA: Diagnosis not present

## 2016-01-07 DIAGNOSIS — M25551 Pain in right hip: Secondary | ICD-10-CM | POA: Diagnosis not present

## 2016-01-07 DIAGNOSIS — B1920 Unspecified viral hepatitis C without hepatic coma: Secondary | ICD-10-CM | POA: Diagnosis not present

## 2016-01-07 DIAGNOSIS — I10 Essential (primary) hypertension: Secondary | ICD-10-CM | POA: Diagnosis not present

## 2016-01-07 DIAGNOSIS — Z7901 Long term (current) use of anticoagulants: Secondary | ICD-10-CM | POA: Diagnosis not present

## 2016-01-07 DIAGNOSIS — I509 Heart failure, unspecified: Secondary | ICD-10-CM | POA: Diagnosis not present

## 2016-01-07 DIAGNOSIS — I4891 Unspecified atrial fibrillation: Secondary | ICD-10-CM | POA: Diagnosis not present

## 2016-01-10 ENCOUNTER — Encounter: Payer: Self-pay | Admitting: Gastroenterology

## 2016-01-16 DIAGNOSIS — R42 Dizziness and giddiness: Secondary | ICD-10-CM | POA: Diagnosis not present

## 2016-01-16 DIAGNOSIS — Z79899 Other long term (current) drug therapy: Secondary | ICD-10-CM | POA: Diagnosis not present

## 2016-01-16 DIAGNOSIS — I10 Essential (primary) hypertension: Secondary | ICD-10-CM | POA: Diagnosis not present

## 2016-01-16 DIAGNOSIS — E7800 Pure hypercholesterolemia, unspecified: Secondary | ICD-10-CM | POA: Diagnosis not present

## 2016-01-16 DIAGNOSIS — R9431 Abnormal electrocardiogram [ECG] [EKG]: Secondary | ICD-10-CM | POA: Diagnosis not present

## 2016-01-16 DIAGNOSIS — F17210 Nicotine dependence, cigarettes, uncomplicated: Secondary | ICD-10-CM | POA: Diagnosis not present

## 2016-01-16 DIAGNOSIS — I5040 Unspecified combined systolic (congestive) and diastolic (congestive) heart failure: Secondary | ICD-10-CM | POA: Diagnosis not present

## 2016-01-16 DIAGNOSIS — R0789 Other chest pain: Secondary | ICD-10-CM | POA: Diagnosis not present

## 2016-01-16 DIAGNOSIS — R0602 Shortness of breath: Secondary | ICD-10-CM | POA: Diagnosis not present

## 2016-01-16 DIAGNOSIS — M5432 Sciatica, left side: Secondary | ICD-10-CM | POA: Diagnosis not present

## 2016-01-16 DIAGNOSIS — B1920 Unspecified viral hepatitis C without hepatic coma: Secondary | ICD-10-CM | POA: Diagnosis not present

## 2016-01-16 DIAGNOSIS — I4891 Unspecified atrial fibrillation: Secondary | ICD-10-CM | POA: Diagnosis not present

## 2016-01-16 DIAGNOSIS — R079 Chest pain, unspecified: Secondary | ICD-10-CM | POA: Diagnosis not present

## 2016-01-16 DIAGNOSIS — J449 Chronic obstructive pulmonary disease, unspecified: Secondary | ICD-10-CM | POA: Diagnosis not present

## 2016-01-16 DIAGNOSIS — M5430 Sciatica, unspecified side: Secondary | ICD-10-CM | POA: Diagnosis not present

## 2016-01-24 DIAGNOSIS — M545 Low back pain: Secondary | ICD-10-CM | POA: Diagnosis not present

## 2016-01-24 DIAGNOSIS — G8929 Other chronic pain: Secondary | ICD-10-CM | POA: Diagnosis not present

## 2016-01-24 DIAGNOSIS — M5442 Lumbago with sciatica, left side: Secondary | ICD-10-CM | POA: Diagnosis not present

## 2016-01-24 DIAGNOSIS — J449 Chronic obstructive pulmonary disease, unspecified: Secondary | ICD-10-CM | POA: Diagnosis not present

## 2016-01-24 DIAGNOSIS — B1920 Unspecified viral hepatitis C without hepatic coma: Secondary | ICD-10-CM | POA: Diagnosis not present

## 2016-01-24 DIAGNOSIS — M25552 Pain in left hip: Secondary | ICD-10-CM | POA: Diagnosis not present

## 2016-01-24 DIAGNOSIS — Z7901 Long term (current) use of anticoagulants: Secondary | ICD-10-CM | POA: Diagnosis not present

## 2016-01-24 DIAGNOSIS — F17210 Nicotine dependence, cigarettes, uncomplicated: Secondary | ICD-10-CM | POA: Diagnosis not present

## 2016-01-24 DIAGNOSIS — E7800 Pure hypercholesterolemia, unspecified: Secondary | ICD-10-CM | POA: Diagnosis not present

## 2016-01-24 DIAGNOSIS — E785 Hyperlipidemia, unspecified: Secondary | ICD-10-CM | POA: Diagnosis not present

## 2016-01-24 DIAGNOSIS — I4891 Unspecified atrial fibrillation: Secondary | ICD-10-CM | POA: Diagnosis not present

## 2016-01-24 DIAGNOSIS — I110 Hypertensive heart disease with heart failure: Secondary | ICD-10-CM | POA: Diagnosis not present

## 2016-01-24 DIAGNOSIS — Z79899 Other long term (current) drug therapy: Secondary | ICD-10-CM | POA: Diagnosis not present

## 2016-01-24 DIAGNOSIS — I509 Heart failure, unspecified: Secondary | ICD-10-CM | POA: Diagnosis not present

## 2016-01-24 DIAGNOSIS — M4806 Spinal stenosis, lumbar region: Secondary | ICD-10-CM | POA: Diagnosis not present

## 2016-02-04 DIAGNOSIS — F17210 Nicotine dependence, cigarettes, uncomplicated: Secondary | ICD-10-CM | POA: Diagnosis not present

## 2016-02-04 DIAGNOSIS — M5442 Lumbago with sciatica, left side: Secondary | ICD-10-CM | POA: Diagnosis not present

## 2016-02-04 DIAGNOSIS — Z7901 Long term (current) use of anticoagulants: Secondary | ICD-10-CM | POA: Diagnosis not present

## 2016-02-04 DIAGNOSIS — M5117 Intervertebral disc disorders with radiculopathy, lumbosacral region: Secondary | ICD-10-CM | POA: Diagnosis not present

## 2016-02-04 DIAGNOSIS — Z79899 Other long term (current) drug therapy: Secondary | ICD-10-CM | POA: Diagnosis not present

## 2016-02-04 DIAGNOSIS — M79605 Pain in left leg: Secondary | ICD-10-CM | POA: Diagnosis not present

## 2016-02-04 DIAGNOSIS — G8929 Other chronic pain: Secondary | ICD-10-CM | POA: Diagnosis not present

## 2016-02-04 DIAGNOSIS — J449 Chronic obstructive pulmonary disease, unspecified: Secondary | ICD-10-CM | POA: Diagnosis not present

## 2016-02-04 DIAGNOSIS — Z7951 Long term (current) use of inhaled steroids: Secondary | ICD-10-CM | POA: Diagnosis not present

## 2016-02-04 DIAGNOSIS — I5022 Chronic systolic (congestive) heart failure: Secondary | ICD-10-CM | POA: Diagnosis not present

## 2016-02-04 DIAGNOSIS — E7800 Pure hypercholesterolemia, unspecified: Secondary | ICD-10-CM | POA: Diagnosis not present

## 2016-02-04 DIAGNOSIS — M4807 Spinal stenosis, lumbosacral region: Secondary | ICD-10-CM | POA: Diagnosis not present

## 2016-02-04 DIAGNOSIS — I110 Hypertensive heart disease with heart failure: Secondary | ICD-10-CM | POA: Diagnosis not present

## 2016-02-04 DIAGNOSIS — I4891 Unspecified atrial fibrillation: Secondary | ICD-10-CM | POA: Diagnosis not present

## 2016-02-04 DIAGNOSIS — M549 Dorsalgia, unspecified: Secondary | ICD-10-CM | POA: Diagnosis not present

## 2016-02-08 ENCOUNTER — Encounter: Payer: Self-pay | Admitting: Gastroenterology

## 2016-03-19 DIAGNOSIS — I4891 Unspecified atrial fibrillation: Secondary | ICD-10-CM | POA: Diagnosis not present

## 2016-03-19 DIAGNOSIS — B1920 Unspecified viral hepatitis C without hepatic coma: Secondary | ICD-10-CM | POA: Diagnosis not present

## 2016-03-19 DIAGNOSIS — J449 Chronic obstructive pulmonary disease, unspecified: Secondary | ICD-10-CM | POA: Diagnosis not present

## 2016-03-19 DIAGNOSIS — Z7901 Long term (current) use of anticoagulants: Secondary | ICD-10-CM | POA: Diagnosis not present

## 2016-03-19 DIAGNOSIS — M79605 Pain in left leg: Secondary | ICD-10-CM | POA: Diagnosis not present

## 2016-03-19 DIAGNOSIS — I10 Essential (primary) hypertension: Secondary | ICD-10-CM | POA: Diagnosis not present

## 2016-03-19 DIAGNOSIS — M5432 Sciatica, left side: Secondary | ICD-10-CM | POA: Diagnosis not present

## 2016-03-19 DIAGNOSIS — Z79899 Other long term (current) drug therapy: Secondary | ICD-10-CM | POA: Diagnosis not present

## 2016-03-19 DIAGNOSIS — M5442 Lumbago with sciatica, left side: Secondary | ICD-10-CM | POA: Diagnosis not present

## 2016-03-19 DIAGNOSIS — F17210 Nicotine dependence, cigarettes, uncomplicated: Secondary | ICD-10-CM | POA: Diagnosis not present

## 2016-03-19 DIAGNOSIS — E7800 Pure hypercholesterolemia, unspecified: Secondary | ICD-10-CM | POA: Diagnosis not present

## 2016-03-19 DIAGNOSIS — M549 Dorsalgia, unspecified: Secondary | ICD-10-CM | POA: Diagnosis not present

## 2016-03-19 DIAGNOSIS — I509 Heart failure, unspecified: Secondary | ICD-10-CM | POA: Diagnosis not present

## 2016-03-19 DIAGNOSIS — G8929 Other chronic pain: Secondary | ICD-10-CM | POA: Diagnosis not present

## 2016-04-30 ENCOUNTER — Emergency Department (HOSPITAL_COMMUNITY)
Admission: EM | Admit: 2016-04-30 | Discharge: 2016-04-30 | Disposition: A | Discharge status: Home / Self Care | Payer: Medicare Other | Attending: Emergency Medicine | Admitting: Emergency Medicine

## 2016-04-30 ENCOUNTER — Encounter (HOSPITAL_COMMUNITY): Payer: Self-pay | Admitting: Nurse Practitioner

## 2016-04-30 DIAGNOSIS — M79606 Pain in leg, unspecified: Secondary | ICD-10-CM | POA: Diagnosis not present

## 2016-04-30 DIAGNOSIS — Z7901 Long term (current) use of anticoagulants: Secondary | ICD-10-CM | POA: Diagnosis not present

## 2016-04-30 DIAGNOSIS — I110 Hypertensive heart disease with heart failure: Secondary | ICD-10-CM | POA: Insufficient documentation

## 2016-04-30 DIAGNOSIS — F17210 Nicotine dependence, cigarettes, uncomplicated: Secondary | ICD-10-CM | POA: Insufficient documentation

## 2016-04-30 DIAGNOSIS — G8929 Other chronic pain: Secondary | ICD-10-CM | POA: Diagnosis not present

## 2016-04-30 DIAGNOSIS — M549 Dorsalgia, unspecified: Secondary | ICD-10-CM

## 2016-04-30 DIAGNOSIS — M545 Low back pain: Secondary | ICD-10-CM | POA: Insufficient documentation

## 2016-04-30 DIAGNOSIS — I5022 Chronic systolic (congestive) heart failure: Secondary | ICD-10-CM | POA: Insufficient documentation

## 2016-04-30 DIAGNOSIS — J449 Chronic obstructive pulmonary disease, unspecified: Secondary | ICD-10-CM | POA: Diagnosis not present

## 2016-04-30 DIAGNOSIS — Z79899 Other long term (current) drug therapy: Secondary | ICD-10-CM | POA: Diagnosis not present

## 2016-04-30 DIAGNOSIS — M542 Cervicalgia: Secondary | ICD-10-CM | POA: Insufficient documentation

## 2016-04-30 DIAGNOSIS — I2510 Atherosclerotic heart disease of native coronary artery without angina pectoris: Secondary | ICD-10-CM | POA: Insufficient documentation

## 2016-04-30 MED ORDER — GABAPENTIN 300 MG PO CAPS: 300.0000 mg | ORAL_CAPSULE | Freq: Three times a day (TID) | ORAL | Status: DC

## 2016-04-30 NOTE — Discharge Instructions (Signed)
Sciatica With Rehab The sciatic nerve runs from the back down the leg and is responsible for sensation and control of the muscles in the back (posterior) side of the thigh, lower leg, and foot. Sciatica is a condition that is characterized by inflammation of this nerve.  SYMPTOMS   Signs of nerve damage, including numbness and/or weakness along the posterior side of the lower extremity.  Pain in the back of the thigh that may also travel down the leg.  Pain that worsens when sitting for long periods of time.  Occasionally, pain in the back or buttock. CAUSES  Inflammation of the sciatic nerve is the cause of sciatica. The inflammation is due to something irritating the nerve. Common sources of irritation include:  Sitting for long periods of time.  Direct trauma to the nerve.  Arthritis of the spine.  Herniated or ruptured disk.  Slipping of the vertebrae (spondylolisthesis).  Pressure from soft tissues, such as muscles or ligament-like tissue (fascia). RISK INCREASES WITH:  Sports that place pressure or stress on the spine (football or weightlifting).  Poor strength and flexibility.  Failure to warm up properly before activity.  Family history of low back pain or disk disorders.  Previous back injury or surgery.  Poor body mechanics, especially when lifting, or poor posture. PREVENTION   Warm up and stretch properly before activity.  Maintain physical fitness:  Strength, flexibility, and endurance.  Cardiovascular fitness.  Learn and use proper technique, especially with posture and lifting. When possible, have coach correct improper technique.  Avoid activities that place stress on the spine. PROGNOSIS If treated properly, then sciatica usually resolves within 6 weeks. However, occasionally surgery is necessary.  RELATED COMPLICATIONS   Permanent nerve damage, including pain, numbness, tingle, or weakness.  Chronic back pain.  Risks of surgery: infection,  bleeding, nerve damage, or damage to surrounding tissues. TREATMENT Treatment initially involves resting from any activities that aggravate your symptoms. The use of ice and medication may help reduce pain and inflammation. The use of strengthening and stretching exercises may help reduce pain with activity. These exercises may be performed at home or with referral to a therapist. A therapist may recommend further treatments, such as transcutaneous electronic nerve stimulation (TENS) or ultrasound. Your caregiver may recommend corticosteroid injections to help reduce inflammation of the sciatic nerve. If symptoms persist despite non-surgical (conservative) treatment, then surgery may be recommended. MEDICATION  If pain medication is necessary, then nonsteroidal anti-inflammatory medications, such as aspirin and ibuprofen, or other minor pain relievers, such as acetaminophen, are often recommended.  Do not take pain medication for 7 days before surgery.  Prescription pain relievers may be given if deemed necessary by your caregiver. Use only as directed and only as much as you need.  Ointments applied to the skin may be helpful.  Corticosteroid injections may be given by your caregiver. These injections should be reserved for the most serious cases, because they may only be given a certain number of times. HEAT AND COLD  Cold treatment (icing) relieves pain and reduces inflammation. Cold treatment should be applied for 10 to 15 minutes every 2 to 3 hours for inflammation and pain and immediately after any activity that aggravates your symptoms. Use ice packs or massage the area with a piece of ice (ice massage).  Heat treatment may be used prior to performing the stretching and strengthening activities prescribed by your caregiver, physical therapist, or athletic trainer. Use a heat pack or soak the injury in warm water.  SEEK MEDICAL CARE IF:  Treatment seems to offer no benefit, or the condition  worsens.  Any medications produce adverse side effects. EXERCISES  RANGE OF MOTION (ROM) AND STRETCHING EXERCISES - Sciatica Most people with sciatic will find that their symptoms worsen with either excessive bending forward (flexion) or arching at the low back (extension). The exercises which will help resolve your symptoms will focus on the opposite motion. Your physician, physical therapist or athletic trainer will help you determine which exercises will be most helpful to resolve your low back pain. Do not complete any exercises without first consulting with your clinician. Discontinue any exercises which worsen your symptoms until you speak to your clinician. If you have pain, numbness or tingling which travels down into your buttocks, leg or foot, the goal of the therapy is for these symptoms to move closer to your back and eventually resolve. Occasionally, these leg symptoms will get better, but your low back pain may worsen; this is typically an indication of progress in your rehabilitation. Be certain to be very alert to any changes in your symptoms and the activities in which you participated in the 24 hours prior to the change. Sharing this information with your clinician will allow him/her to most efficiently treat your condition. These exercises may help you when beginning to rehabilitate your injury. Your symptoms may resolve with or without further involvement from your physician, physical therapist or athletic trainer. While completing these exercises, remember:   Restoring tissue flexibility helps normal motion to return to the joints. This allows healthier, less painful movement and activity.  An effective stretch should be held for at least 30 seconds.  A stretch should never be painful. You should only feel a gentle lengthening or release in the stretched tissue. FLEXION RANGE OF MOTION AND STRETCHING EXERCISES: STRETCH - Flexion, Single Knee to Chest   Lie on a firm bed or floor  with both legs extended in front of you.  Keeping one leg in contact with the floor, bring your opposite knee to your chest. Hold your leg in place by either grabbing behind your thigh or at your knee.  Pull until you feel a gentle stretch in your low back. Hold __________ seconds.  Slowly release your grasp and repeat the exercise with the opposite side. Repeat __________ times. Complete this exercise __________ times per day.  STRETCH - Flexion, Double Knee to Chest  Lie on a firm bed or floor with both legs extended in front of you.  Keeping one leg in contact with the floor, bring your opposite knee to your chest.  Tense your stomach muscles to support your back and then lift your other knee to your chest. Hold your legs in place by either grabbing behind your thighs or at your knees.  Pull both knees toward your chest until you feel a gentle stretch in your low back. Hold __________ seconds.  Tense your stomach muscles and slowly return one leg at a time to the floor. Repeat __________ times. Complete this exercise __________ times per day.  STRETCH - Low Trunk Rotation   Lie on a firm bed or floor. Keeping your legs in front of you, bend your knees so they are both pointed toward the ceiling and your feet are flat on the floor.  Extend your arms out to the side. This will stabilize your upper body by keeping your shoulders in contact with the floor.  Gently and slowly drop both knees together to one side until  you feel a gentle stretch in your low back. Hold for __________ seconds.  Tense your stomach muscles to support your low back as you bring your knees back to the starting position. Repeat the exercise to the other side. Repeat __________ times. Complete this exercise __________ times per day  EXTENSION RANGE OF MOTION AND FLEXIBILITY EXERCISES: STRETCH - Extension, Prone on Elbows  Lie on your stomach on the floor, a bed will be too soft. Place your palms about shoulder  width apart and at the height of your head.  Place your elbows under your shoulders. If this is too painful, stack pillows under your chest.  Allow your body to relax so that your hips drop lower and make contact more completely with the floor.  Hold this position for __________ seconds.  Slowly return to lying flat on the floor. Repeat __________ times. Complete this exercise __________ times per day.  RANGE OF MOTION - Extension, Prone Press Ups  Lie on your stomach on the floor, a bed will be too soft. Place your palms about shoulder width apart and at the height of your head.  Keeping your back as relaxed as possible, slowly straighten your elbows while keeping your hips on the floor. You may adjust the placement of your hands to maximize your comfort. As you gain motion, your hands will come more underneath your shoulders.  Hold this position __________ seconds.  Slowly return to lying flat on the floor. Repeat __________ times. Complete this exercise __________ times per day.  STRENGTHENING EXERCISES - Sciatica  These exercises may help you when beginning to rehabilitate your injury. These exercises should be done near your "sweet spot." This is the neutral, low-back arch, somewhere between fully rounded and fully arched, that is your least painful position. When performed in this safe range of motion, these exercises can be used for people who have either a flexion or extension based injury. These exercises may resolve your symptoms with or without further involvement from your physician, physical therapist or athletic trainer. While completing these exercises, remember:   Muscles can gain both the endurance and the strength needed for everyday activities through controlled exercises.  Complete these exercises as instructed by your physician, physical therapist or athletic trainer. Progress with the resistance and repetition exercises only as your caregiver advises.  You may  experience muscle soreness or fatigue, but the pain or discomfort you are trying to eliminate should never worsen during these exercises. If this pain does worsen, stop and make certain you are following the directions exactly. If the pain is still present after adjustments, discontinue the exercise until you can discuss the trouble with your clinician. STRENGTHENING - Deep Abdominals, Pelvic Tilt   Lie on a firm bed or floor. Keeping your legs in front of you, bend your knees so they are both pointed toward the ceiling and your feet are flat on the floor.  Tense your lower abdominal muscles to press your low back into the floor. This motion will rotate your pelvis so that your tail bone is scooping upwards rather than pointing at your feet or into the floor.  With a gentle tension and even breathing, hold this position for __________ seconds. Repeat __________ times. Complete this exercise __________ times per day.  STRENGTHENING - Abdominals, Crunches   Lie on a firm bed or floor. Keeping your legs in front of you, bend your knees so they are both pointed toward the ceiling and your feet are flat on the  floor. Cross your arms over your chest.  Slightly tip your chin down without bending your neck.  Tense your abdominals and slowly lift your trunk high enough to just clear your shoulder blades. Lifting higher can put excessive stress on the low back and does not further strengthen your abdominal muscles.  Control your return to the starting position. Repeat __________ times. Complete this exercise __________ times per day.  STRENGTHENING - Quadruped, Opposite UE/LE Lift  Assume a hands and knees position on a firm surface. Keep your hands under your shoulders and your knees under your hips. You may place padding under your knees for comfort.  Find your neutral spine and gently tense your abdominal muscles so that you can maintain this position. Your shoulders and hips should form a rectangle  that is parallel with the floor and is not twisted.  Keeping your trunk steady, lift your right hand no higher than your shoulder and then your left leg no higher than your hip. Make sure you are not holding your breath. Hold this position __________ seconds.  Continuing to keep your abdominal muscles tense and your back steady, slowly return to your starting position. Repeat with the opposite arm and leg. Repeat __________ times. Complete this exercise __________ times per day.  STRENGTHENING - Abdominals and Quadriceps, Straight Leg Raise   Lie on a firm bed or floor with both legs extended in front of you.  Keeping one leg in contact with the floor, bend the other knee so that your foot can rest flat on the floor.  Find your neutral spine, and tense your abdominal muscles to maintain your spinal position throughout the exercise.  Slowly lift your straight leg off the floor about 6 inches for a count of 15, making sure to not hold your breath.  Still keeping your neutral spine, slowly lower your leg all the way to the floor. Repeat this exercise with each leg __________ times. Complete this exercise __________ times per day. POSTURE AND BODY MECHANICS CONSIDERATIONS - Sciatica Keeping correct posture when sitting, standing or completing your activities will reduce the stress put on different body tissues, allowing injured tissues a chance to heal and limiting painful experiences. The following are general guidelines for improved posture. Your physician or physical therapist will provide you with any instructions specific to your needs. While reading these guidelines, remember:  The exercises prescribed by your provider will help you have the flexibility and strength to maintain correct postures.  The correct posture provides the optimal environment for your joints to work. All of your joints have less wear and tear when properly supported by a spine with good posture. This means you will  experience a healthier, less painful body.  Correct posture must be practiced with all of your activities, especially prolonged sitting and standing. Correct posture is as important when doing repetitive low-stress activities (typing) as it is when doing a single heavy-load activity (lifting). RESTING POSITIONS Consider which positions are most painful for you when choosing a resting position. If you have pain with flexion-based activities (sitting, bending, stooping, squatting), choose a position that allows you to rest in a less flexed posture. You would want to avoid curling into a fetal position on your side. If your pain worsens with extension-based activities (prolonged standing, working overhead), avoid resting in an extended position such as sleeping on your stomach. Most people will find more comfort when they rest with their spine in a more neutral position, neither too rounded nor too  arched. Lying on a non-sagging bed on your side with a pillow between your knees, or on your back with a pillow under your knees will often provide some relief. Keep in mind, being in any one position for a prolonged period of time, no matter how correct your posture, can still lead to stiffness. PROPER SITTING POSTURE In order to minimize stress and discomfort on your spine, you must sit with correct posture Sitting with good posture should be effortless for a healthy body. Returning to good posture is a gradual process. Many people can work toward this most comfortably by using various supports until they have the flexibility and strength to maintain this posture on their own. When sitting with proper posture, your ears will fall over your shoulders and your shoulders will fall over your hips. You should use the back of the chair to support your upper back. Your low back will be in a neutral position, just slightly arched. You may place a small pillow or folded towel at the base of your low back for support.  When  working at a desk, create an environment that supports good, upright posture. Without extra support, muscles fatigue and lead to excessive strain on joints and other tissues. Keep these recommendations in mind: CHAIR:   A chair should be able to slide under your desk when your back makes contact with the back of the chair. This allows you to work closely.  The chair's height should allow your eyes to be level with the upper part of your monitor and your hands to be slightly lower than your elbows. BODY POSITION  Your feet should make contact with the floor. If this is not possible, use a foot rest.  Keep your ears over your shoulders. This will reduce stress on your neck and low back. INCORRECT SITTING POSTURES   If you are feeling tired and unable to assume a healthy sitting posture, do not slouch or slump. This puts excessive strain on your back tissues, causing more damage and pain. Healthier options include:  Using more support, like a lumbar pillow.  Switching tasks to something that requires you to be upright or walking.  Talking a brief walk.  Lying down to rest in a neutral-spine position. PROLONGED STANDING WHILE SLIGHTLY LEANING FORWARD  When completing a task that requires you to lean forward while standing in one place for a long time, place either foot up on a stationary 2-4 inch high object to help maintain the best posture. When both feet are on the ground, the low back tends to lose its slight inward curve. If this curve flattens (or becomes too large), then the back and your other joints will experience too much stress, fatigue more quickly and can cause pain.  CORRECT STANDING POSTURES Proper standing posture should be assumed with all daily activities, even if they only take a few moments, like when brushing your teeth. As in sitting, your ears should fall over your shoulders and your shoulders should fall over your hips. You should keep a slight tension in your abdominal  muscles to brace your spine. Your tailbone should point down to the ground, not behind your body, resulting in an over-extended swayback posture.  INCORRECT STANDING POSTURES  Common incorrect standing postures include a forward head, locked knees and/or an excessive swayback. WALKING Walk with an upright posture. Your ears, shoulders and hips should all line-up. PROLONGED ACTIVITY IN A FLEXED POSITION When completing a task that requires you to bend forward  at your waist or lean over a low surface, try to find a way to stabilize 3 of 4 of your limbs. You can place a hand or elbow on your thigh or rest a knee on the surface you are reaching across. This will provide you more stability so that your muscles do not fatigue as quickly. By keeping your knees relaxed, or slightly bent, you will also reduce stress across your low back. CORRECT LIFTING TECHNIQUES DO :   Assume a wide stance. This will provide you more stability and the opportunity to get as close as possible to the object which you are lifting.  Tense your abdominals to brace your spine; then bend at the knees and hips. Keeping your back locked in a neutral-spine position, lift using your leg muscles. Lift with your legs, keeping your back straight.  Test the weight of unknown objects before attempting to lift them.  Try to keep your elbows locked down at your sides in order get the best strength from your shoulders when carrying an object.  Always ask for help when lifting heavy or awkward objects. INCORRECT LIFTING TECHNIQUES DO NOT:   Lock your knees when lifting, even if it is a small object.  Bend and twist. Pivot at your feet or move your feet when needing to change directions.  Assume that you cannot safely pick up a paperclip without proper posture.   This information is not intended to replace advice given to you by your health care provider. Make sure you discuss any questions you have with your health care provider.     Document Released: 10/06/2005 Document Revised: 02/20/2015 Document Reviewed: 01/18/2009 Elsevier Interactive Patient Education 2016 [facility] [employer] is a great source of information about community services available.  Access by dialing 2-1-1 from anywhere in [place], or by website -  [employer].   Other Local Resources (Updated 10/2015)   Chronic Pain   Services     Phone Number and Address  [facility]  Pain management is offered by board-certified physician specialists, physical therapists, occupational therapists, and other professionals  Support groups [phone] [address], [place], [place], [place] [address]  [facility]  Various pain management strategies are offered, including medication, acupuncture, and manual therapy  Must be referred by primary care doctor [phone] [address], [place]   [facility], [place]  Pain management clinic  Must be referred by primary care doctor [phone] [address] [place], [facility]  Pain management is offered by board-certified physician specialists [phone] [address] [place], [place] [address]

## 2016-04-30 NOTE — ED Notes (Signed)
Declined W/C at D/C and was escorted to lobby by RN. 

## 2016-04-30 NOTE — ED Notes (Signed)
Pt c/o 2 day history increasing back pain. Reports a history of sciatica, pain has been worse since he ran out of his vicodin 2 days ago, he does not have any refills. He denies any new injuries. He is alert, breathing easily

## 2016-04-30 NOTE — ED Provider Notes (Signed)
CSN: [id number]     Arrival date & time 04/30/16  1128 History  By signing my name below, I, [facility], attest that this documentation has been prepared under the direction and in the presence of Montine Circle, PA-C. Electronically Signed: Virgel Bouquet, ED Scribe. 04/30/2016. 11:58 AM.    Chief Complaint  Patient presents with  . Back Pain    The history is provided by the patient. No language interpreter was used.   HPI Comments: AYDRIC BENT is a 70 y.o. male with a hx of chronic back pain, HTN, CHF, NICM, CAD, COPD, and adenocarcinoma of colon who presents to the Emergency Department complaining of constant, mild, gradually worsening back pain onset 2 days ago. Pt states that he has a hx of sciatica, recently moved from [place] to [place], and ran out of his prescription of Vicodin. He notes that he has been in pain management in the past but left treatment when he moved to [place], [place]. He has seen his PCP for this complaint. Denies weakness.  Past Medical History  Diagnosis Date  . Persistent atrial fibrillation (HCC)     a. s/p DCCV; b. Amiodarone  . Hypertension   . Chronic systolic CHF (congestive heart failure) (HCC)     a. EF previously 15%; b. Echo 7/16:  EF 30-35%, diff HK, gr 1 DD, mild MR, mild LAE, mild reduced RVSF  . NICM (nonischemic cardiomyopathy) ([place])     no obs CAD on LHC in 1/16 - ? HTN or substance abuse  . CAD (coronary artery disease)     a. LHC 1/16:  LM 20%, oLAD 30%, oLCx 30%, pRCA 30%  . Adenocarcinoma of colon ([place])     STAGE 1  . Substance abuse     ETOH; crack cocaine  . COPD (chronic obstructive pulmonary disease) ([place])   . Tobacco abuse   . GERD (gastroesophageal reflux disease)   . Hepatitis C   . Migraine     "last one was back in the 1980's" (04/19/2015)  . Arthritis     "left hip" (04/19/2015)  . Chronic lower back pain   . Anxiety   . Depression    Past Surgical History  Procedure Laterality Date  .  Cardioversion      "I've had 2 in all" (04/19/2015)  . Colonoscopy    . Left and right heart catheterization with coronary angiogram N/A 10/23/2014    Procedure: LEFT AND RIGHT HEART CATHETERIZATION WITH CORONARY ANGIOGRAM;  Surgeon: Larey Dresser, MD;  Location: [facility] CATH LAB;  Service: Cardiovascular;  Laterality: N/A;  . Colonoscopy with propofol N/A 11/30/2014    Procedure: COLONOSCOPY WITH PROPOFOL;  Surgeon: Milus Banister, MD;  Location: [facility] ENDOSCOPY;  Service: Endoscopy;  Laterality: N/A;  . Cardioversion N/A 01/30/2015    Procedure: CARDIOVERSION;  Surgeon: Larey Dresser, MD;  Location: [facility] ENDOSCOPY;  Service: Cardiovascular;  Laterality: N/A;  . Cardiac catheterization  10/23/2014   Family History  Problem Relation Age of Onset  . Hypertension Mother    Social History  Substance Use Topics  . Smoking status: Light Tobacco Smoker -- 0.10 packs/day for 52 years    Types: Cigarettes    Last Attempt to Quit: 09/01/2014  . Smokeless tobacco: Never Used     Comment: 04/19/2015 "probably smoke 1 pack cigarettes/month"  . Alcohol Use: 0.0 oz/week    0 Standard drinks or equivalent per week     Comment: 04/19/2015 "I might drink a 40oz beer/month"  Review of Systems  Constitutional: Negative for fever and chills.  Gastrointestinal:       No bowel incontinence  Genitourinary:       No urinary incontinence  Musculoskeletal: Positive for myalgias, back pain and arthralgias.  Neurological: Negative for weakness.       No saddle anesthesia      Allergies  Review of patient's allergies indicates no known allergies.  Home Medications   Prior to Admission medications   Medication Sig Start Date End Date Taking? Authorizing Provider  acetaminophen (TYLENOL) 325 MG tablet Take 2 tablets (650 mg total) by mouth every 6 (six) hours as needed. 12/20/15   Nona Dell, PA-C  amiodarone (PACERONE) 200 MG tablet Take 1 tablet (200 mg total) by mouth daily. 08/20/15   Larey Dresser, MD  atorvastatin (LIPITOR) 20 MG tablet Take 1 tablet (20 mg total) by mouth daily. 08/20/15   Larey Dresser, MD  carvedilol (COREG) 3.125 MG tablet TAKE 1 TABLET (3.125 MG TOTAL) BY MOUTH TWO TIMES DAILY WITH A MEAL. 12/17/15   Larey Dresser, MD  carvedilol (COREG) 6.25 MG tablet Take 6.25 mg by mouth 2 (two) times daily. 11/21/15   Historical Provider, MD  furosemide (LASIX) 40 MG tablet TAKE 1 TABLET BY MOUTH   DAILY Patient taking differently: TAKE40 MG BY MOUTH   DAILY 05/16/15   Larey Dresser, MD  gabapentin (NEURONTIN) 300 MG capsule Take 1 capsule (300 mg total) by mouth 3 (three) times daily. 04/30/16   Montine Circle, PA-C  losartan (COZAAR) 25 MG tablet Take 1 tablet (25 mg total) by mouth daily. 08/23/15   Larey Dresser, MD  mometasone-formoterol [facility]) 200-5 MCG/ACT AERO Inhale 2 puffs into the lungs 2 (two) times daily as needed for wheezing. Patient not taking: Reported on 12/20/2015 01/06/15   Leone Brand, MD  oxyCODONE-acetaminophen (PERCOCET/ROXICET) 5-325 MG tablet Take 1 tablet by mouth every 4 (four) hours as needed for severe pain. Patient not taking: Reported on 12/20/2015 12/03/15   Nona Dell, PA-C  pantoprazole (PROTONIX) 40 MG tablet Take 1 tablet (40 mg total) by mouth daily. 08/20/15   Larey Dresser, MD  predniSONE (DELTASONE) 20 MG tablet 3 tabs po daily x 3 days, then 2 tabs x 3 days, then 1.5 tabs x 3 days, then 1 tab x 3 days, then 0.5 tabs x 3 days Patient taking differently: Take 20 mg by mouth daily with breakfast.  12/10/15   Clayton Bibles, PA-C  spironolactone (ALDACTONE) 25 MG tablet Take 1 tablet (25 mg total) by mouth daily. 08/20/15   Larey Dresser, MD  Umeclidinium Bromide (INCRUSE ELLIPTA) 62.5 MCG/INH AEPB Inhale 1 Inhaler into the lungs daily. Patient not taking: Reported on 12/20/2015 10/06/15   Olam Idler, MD  XARELTO 20 MG TABS tablet TAKE ONE TABLET   BY MOUTH   DAILY Patient taking differently: TAKE 20 MG   BY MOUTH    DAILY 12/17/15   Larey Dresser, MD   BP 111/87 mmHg  Pulse 115  Temp(Src) 98.1 F (36.7 C) (Oral)  Resp 21  SpO2 99% Physical Exam   Physical Exam  Constitutional: Pt appears well-developed and well-nourished. No distress.  HENT:  Head: Normocephalic and atraumatic.  Mouth/Throat: Oropharynx is clear and moist. No oropharyngeal exudate.  Eyes: Conjunctivae are normal.  Neck: Normal range of motion. Neck supple.  No meningismus Cardiovascular: Normal rate, regular rhythm and intact distal pulses.   Pulmonary/Chest: Effort normal  and breath sounds normal. No respiratory distress. Pt has no wheezes.  Abdominal: Pt exhibits no distension Musculoskeletal:  Cervical paraspinal and lumbar paraspinal muscle tenderness to palpation, no bony CTLS spine tenderness, deformity, step-off, or crepitus Lymphadenopathy: Pt has no cervical adenopathy.  Neurological: Pt is alert and oriented Speech is clear and goal oriented, follows commands Normal 5/5 strength in upper and lower extremities bilaterally including dorsiflexion and plantar flexion, strong and equal grip strength Sensation intact Great toe extension intact Moves extremities without ataxia, coordination intact Normal gait Normal balance No Clonus Skin: Skin is warm and dry. No rash noted. Pt is not diaphoretic. No erythema.  Psychiatric: Pt has a normal mood and affect. Behavior is normal.  Nursing note and vitals reviewed.   ED Course  Procedures   DIAGNOSTIC STUDIES: Oxygen Saturation is 99% on RA, normal by my interpretation.    COORDINATION OF CARE: 11:53 AM Will prescribe gabapentin. Will provide pt with information for pain management clinic. Will discharge pt. Discussed treatment plan with pt at bedside and pt agreed to plan.   MDM   Final diagnoses:  Chronic back pain    Patient with back pain.  No neurological deficits and normal neuro exam.  Patient is ambulatory.  No loss of bowel or bladder control.  Doubt  cauda equina.  Denies fever,  doubt epidural abscess or other lesion. Recommend back exercises, stretching, RICE, and will treat with a short course of neurontin.  Encouraged the patient that there could be a need for additional workup and/or imaging such as MRI, if the symptoms do not resolve. Patient advised that if the back pain does not resolve, or radiates, this could progress to more serious conditions and is encouraged to follow-up with PCP or orthopedics within 2 weeks.     I personally performed the services described in this documentation, which was scribed in my presence. The recorded information has been reviewed and is accurate.      Montine Circle, PA-C 04/30/16 1200  Carmin Muskrat, MD 05/01/16 1556

## 2016-05-01 DIAGNOSIS — Z5181 Encounter for therapeutic drug level monitoring: Secondary | ICD-10-CM | POA: Diagnosis not present

## 2016-05-05 DIAGNOSIS — Z5181 Encounter for therapeutic drug level monitoring: Secondary | ICD-10-CM | POA: Diagnosis not present

## 2016-05-07 DIAGNOSIS — Z5181 Encounter for therapeutic drug level monitoring: Secondary | ICD-10-CM | POA: Diagnosis not present

## 2016-05-14 DIAGNOSIS — Z5181 Encounter for therapeutic drug level monitoring: Secondary | ICD-10-CM | POA: Diagnosis not present

## 2016-05-19 ENCOUNTER — Telehealth: Payer: Self-pay | Admitting: *Deleted

## 2016-05-19 ENCOUNTER — Other Ambulatory Visit: Payer: Self-pay | Admitting: Cardiology

## 2016-05-19 DIAGNOSIS — Z5181 Encounter for therapeutic drug level monitoring: Secondary | ICD-10-CM | POA: Diagnosis not present

## 2016-05-19 MED ORDER — RIVAROXABAN 20 MG PO TABS: 20.0000 mg | ORAL_TABLET | Freq: Every day | ORAL | 0 refills | Status: DC

## 2016-05-19 MED ORDER — RIVAROXABAN 20 MG PO TABS: 20.0000 mg | ORAL_TABLET | Freq: Every day | ORAL | 2 refills | Status: DC

## 2016-05-19 NOTE — Addendum Note (Signed)
Addended by: Roberts Gaudy on: 05/19/2016 04:33 PM   Modules accepted: Orders

## 2016-05-19 NOTE — Telephone Encounter (Signed)
Heart Failure patient, will forward

## 2016-05-20 MED ORDER — RIVAROXABAN 20 MG PO TABS: 20.0000 mg | ORAL_TABLET | Freq: Every day | ORAL | 1 refills | Status: DC

## 2016-05-20 NOTE — Telephone Encounter (Signed)
rx SENT TO PHARMACY

## 2016-05-21 DIAGNOSIS — Z5181 Encounter for therapeutic drug level monitoring: Secondary | ICD-10-CM | POA: Diagnosis not present

## 2016-05-26 DIAGNOSIS — Z5181 Encounter for therapeutic drug level monitoring: Secondary | ICD-10-CM | POA: Diagnosis not present

## 2016-05-27 ENCOUNTER — Encounter (HOSPITAL_COMMUNITY): Payer: Self-pay | Admitting: Emergency Medicine

## 2016-05-27 ENCOUNTER — Emergency Department (HOSPITAL_COMMUNITY)
Admission: EM | Admit: 2016-05-27 | Discharge: 2016-05-27 | Disposition: A | Discharge status: Home / Self Care | Payer: Medicare Other | Attending: Emergency Medicine | Admitting: Emergency Medicine

## 2016-05-27 ENCOUNTER — Emergency Department (HOSPITAL_COMMUNITY): Payer: Medicare Other

## 2016-05-27 DIAGNOSIS — Y999 Unspecified external cause status: Secondary | ICD-10-CM | POA: Diagnosis not present

## 2016-05-27 DIAGNOSIS — Z87891 Personal history of nicotine dependence: Secondary | ICD-10-CM | POA: Insufficient documentation

## 2016-05-27 DIAGNOSIS — Z7901 Long term (current) use of anticoagulants: Secondary | ICD-10-CM | POA: Insufficient documentation

## 2016-05-27 DIAGNOSIS — Y929 Unspecified place or not applicable: Secondary | ICD-10-CM | POA: Diagnosis not present

## 2016-05-27 DIAGNOSIS — I5022 Chronic systolic (congestive) heart failure: Secondary | ICD-10-CM | POA: Insufficient documentation

## 2016-05-27 DIAGNOSIS — Z85038 Personal history of other malignant neoplasm of large intestine: Secondary | ICD-10-CM | POA: Diagnosis not present

## 2016-05-27 DIAGNOSIS — Y939 Activity, unspecified: Secondary | ICD-10-CM | POA: Insufficient documentation

## 2016-05-27 DIAGNOSIS — I110 Hypertensive heart disease with heart failure: Secondary | ICD-10-CM | POA: Diagnosis not present

## 2016-05-27 DIAGNOSIS — I2510 Atherosclerotic heart disease of native coronary artery without angina pectoris: Secondary | ICD-10-CM | POA: Insufficient documentation

## 2016-05-27 DIAGNOSIS — M5030 Other cervical disc degeneration, unspecified cervical region: Secondary | ICD-10-CM | POA: Insufficient documentation

## 2016-05-27 DIAGNOSIS — M50322 Other cervical disc degeneration at C5-C6 level: Secondary | ICD-10-CM | POA: Diagnosis not present

## 2016-05-27 DIAGNOSIS — W1839XA Other fall on same level, initial encounter: Secondary | ICD-10-CM | POA: Diagnosis not present

## 2016-05-27 DIAGNOSIS — M542 Cervicalgia: Secondary | ICD-10-CM | POA: Diagnosis not present

## 2016-05-27 DIAGNOSIS — W19XXXA Unspecified fall, initial encounter: Secondary | ICD-10-CM

## 2016-05-27 DIAGNOSIS — S199XXA Unspecified injury of neck, initial encounter: Secondary | ICD-10-CM | POA: Diagnosis not present

## 2016-05-27 DIAGNOSIS — J449 Chronic obstructive pulmonary disease, unspecified: Secondary | ICD-10-CM | POA: Diagnosis not present

## 2016-05-27 DIAGNOSIS — M50323 Other cervical disc degeneration at C6-C7 level: Secondary | ICD-10-CM | POA: Diagnosis not present

## 2016-05-27 DIAGNOSIS — M5136 Other intervertebral disc degeneration, lumbar region: Secondary | ICD-10-CM | POA: Diagnosis not present

## 2016-05-27 MED ORDER — HYDROCODONE-ACETAMINOPHEN 5-325 MG PO TABS
2.0000 | ORAL_TABLET | Freq: Once | ORAL | Status: AC
  Administered 2016-05-27: 2 via ORAL
  Filled 2016-05-27: qty 2

## 2016-05-27 MED ORDER — HYDROCODONE-ACETAMINOPHEN 5-325 MG PO TABS: 1.0000 | ORAL_TABLET | Freq: Four times a day (QID) | ORAL | 0 refills | Status: DC | PRN

## 2016-05-27 NOTE — ED Notes (Signed)
Upon this rn entering the room, pt had removed c collar, pt educated on importance of maintaining placement of c collar, and c collar placed back on patient.

## 2016-05-27 NOTE — ED Triage Notes (Signed)
Pt sts hx of sciatica and reports that his leg gave out on him and caused him to fall. Pt c/o neck pain in triage. Pt denies LOC, numbness. Pt moves neck in all directions to show RN where it hurts. Pt requesting c collar.

## 2016-05-27 NOTE — ED Notes (Addendum)
Pt came back to front desk demanding a neck brace for pain he is having in his neck.

## 2016-05-27 NOTE — ED Notes (Addendum)
Pt yelling in waiting room that he has been waiting for an hour to be seen. Pt has been here for approximately 30 min.

## 2016-05-27 NOTE — ED Notes (Signed)
Patient transported to CT 

## 2016-05-27 NOTE — Discharge Instructions (Signed)
It was our pleasure to provide your ER care today - we hope that you feel better.  Your CT scan was read as follows: IMPRESSION: 1. No fracture or traumatic malalignment. 2. Multilevel degenerative changes most marked at C5-6 and C6-7 with mild canal narrowing at C5-6 and mild narrowing of the left neural foramen.  You may apply heat to sore area.   You may take hydrocodone as need for pain. No driving fwhen taking hydrocodone. Also, do not take tylenol or acetaminophen containing medication when taking hydrocodone.  Follow up with primary care doctor in the next 1-2 weeks.   Return to ER if worse, new symptoms, intractable pain, numbness/weakness, severe headache, vomiting, other concern.  Since you are taking a blood thinner medication, use great care/caution to avoid falling.  You were given pain medication in the ER - no driving for the next 4 hours.

## 2016-05-27 NOTE — ED Notes (Addendum)
Pt came to tech first requesting that we take him straight back, stated that he usually calls the ambulance and gets back right away. Pt yelling in waiting room that he needs "To be taken back".

## 2016-05-27 NOTE — ED Provider Notes (Signed)
[facility] DEPT Provider Note   CSN: [id number] Arrival date & time: 05/27/16  1029  First Provider Contact:  First MD Initiated Contact with Patient 05/27/16 1233        History   Chief Complaint Chief Complaint  Patient presents with  . Fall  . Neck Pain    HPI Paul Clayton is a 70 y.o. male.  Patient with hx DDD, chronic back pain, c/o fall today, and now left neck pain.  Pain mod-sev, constant, dull.  Patient states he felt like left leg buckled, causing him to lose balance and fall to left. Denies any faintness or dizziness prior to fall. No head injury, contusion, or loc with fall. Post fall, c/o left neck pain that radiates towards left shoulder. No pain down arm. No numbness/weakness.  Denies worsening of his back pain. Denies any other pain or injury.    The history is provided by the patient.  Fall  Pertinent negatives include no chest pain, no abdominal pain, no headaches and no shortness of breath.  Neck Pain   Pertinent negatives include no chest pain, no numbness, no headaches and no weakness.    Past Medical History:  Diagnosis Date  . Adenocarcinoma of colon ([place])    STAGE 1  . Anxiety   . Arthritis    "left hip" (04/19/2015)  . CAD (coronary artery disease)    a. LHC 1/16:  LM 20%, oLAD 30%, oLCx 30%, pRCA 30%  . Chronic lower back pain   . Chronic systolic CHF (congestive heart failure) (HCC)    a. EF previously 15%; b. Echo 7/16:  EF 30-35%, diff HK, gr 1 DD, mild MR, mild LAE, mild reduced RVSF  . COPD (chronic obstructive pulmonary disease) ([place])   . Depression   . GERD (gastroesophageal reflux disease)   . Hepatitis C   . Hypertension   . Migraine    "last one was back in the 1980's" (04/19/2015)  . NICM (nonischemic cardiomyopathy) ([place])    no obs CAD on LHC in 1/16 - ? HTN or substance abuse  . Persistent atrial fibrillation (HCC)    a. s/p DCCV; b. Amiodarone  . Substance abuse    ETOH; crack cocaine  . Tobacco abuse     Patient  Active Problem List   Diagnosis Date Noted  . Urinary incontinence 05/03/2015  . LBP radiating to left leg 03/12/2015  . Nonischemic cardiomyopathy ([place]) 03/05/2015  . Pain in the chest 03/01/2015  . Elevated troponin   . Paroxysmal atrial fibrillation ([place]) 02/25/2015  . HF (heart failure) ([place]) 11/10/2014  . Near syncope 11/09/2014  . Hx of substance abuse 10/30/2014  . SOB (shortness of breath)   . Acute decompensated heart failure ([place])   . Systolic CHF, chronic ([place])   . Atrial fibrillation with rapid ventricular response ([place])   . Faintness   . Chronic systolic heart failure ([place])   . Persistent atrial fibrillation ([place])   . Chronic systolic CHF (congestive heart failure) ([place]) 10/17/2014  . Homeless single person   . Atrial fibrillation with RVR ([place]) 10/13/2014  . High risk social situation 09/30/2014  . Colon cancer ([place]) 09/26/2014  . Dyspnea 09/22/2014  . Chest pain 09/16/2014  . Syncope   . COPD exacerbation ([place])   . HFrEF (heart failure with reduced ejection fraction) ([place]) 09/12/2014  . A-fib ([place]) 09/12/2014  . COPD (chronic obstructive pulmonary disease) ([place]) 09/12/2014    Past Surgical History:  Procedure Laterality Date  .  CARDIAC CATHETERIZATION  10/23/2014  . CARDIOVERSION     "I've had 2 in all" (04/19/2015)  . CARDIOVERSION N/A 01/30/2015   Procedure: CARDIOVERSION;  Surgeon: Larey Dresser, MD;  Location: [facility];  Service: Cardiovascular;  Laterality: N/A;  . COLONOSCOPY    . COLONOSCOPY WITH PROPOFOL N/A 11/30/2014   Procedure: COLONOSCOPY WITH PROPOFOL;  Surgeon: Milus Banister, MD;  Location: [facility] ENDOSCOPY;  Service: Endoscopy;  Laterality: N/A;  . LEFT AND RIGHT HEART CATHETERIZATION WITH CORONARY ANGIOGRAM N/A 10/23/2014   Procedure: LEFT AND RIGHT HEART CATHETERIZATION WITH CORONARY ANGIOGRAM;  Surgeon: Larey Dresser, MD;  Location: [facility] CATH LAB;  Service: Cardiovascular;  Laterality: N/A;       Home Medications    Prior to Admission  medications   Medication Sig Start Date End Date Taking? Authorizing Provider  acetaminophen (TYLENOL) 325 MG tablet Take 2 tablets (650 mg total) by mouth every 6 (six) hours as needed. 12/20/15   Nona Dell, PA-C  amiodarone (PACERONE) 200 MG tablet Take 1 tablet (200 mg total) by mouth daily. 08/20/15   Larey Dresser, MD  atorvastatin (LIPITOR) 20 MG tablet Take 1 tablet (20 mg total) by mouth daily. 08/20/15   Larey Dresser, MD  carvedilol (COREG) 3.125 MG tablet TAKE 1 TABLET (3.125 MG TOTAL) BY MOUTH TWO TIMES DAILY WITH A MEAL. 12/17/15   Larey Dresser, MD  carvedilol (COREG) 6.25 MG tablet Take 6.25 mg by mouth 2 (two) times daily. 11/21/15   Historical Provider, MD  furosemide (LASIX) 40 MG tablet TAKE 1 TABLET BY MOUTH   DAILY Patient taking differently: TAKE40 MG BY MOUTH   DAILY 05/16/15   Larey Dresser, MD  gabapentin (NEURONTIN) 300 MG capsule Take 1 capsule (300 mg total) by mouth 3 (three) times daily. 04/30/16   Montine Circle, PA-C  losartan (COZAAR) 25 MG tablet Take 1 tablet (25 mg total) by mouth daily. 08/23/15   Larey Dresser, MD  mometasone-formoterol [facility]) 200-5 MCG/ACT AERO Inhale 2 puffs into the lungs 2 (two) times daily as needed for wheezing. Patient not taking: Reported on 12/20/2015 01/06/15   Leone Brand, MD  oxyCODONE-acetaminophen (PERCOCET/ROXICET) 5-325 MG tablet Take 1 tablet by mouth every 4 (four) hours as needed for severe pain. Patient not taking: Reported on 12/20/2015 12/03/15   Nona Dell, PA-C  pantoprazole (PROTONIX) 40 MG tablet Take 1 tablet (40 mg total) by mouth daily. 08/20/15   Larey Dresser, MD  predniSONE (DELTASONE) 20 MG tablet 3 tabs po daily x 3 days, then 2 tabs x 3 days, then 1.5 tabs x 3 days, then 1 tab x 3 days, then 0.5 tabs x 3 days Patient taking differently: Take 20 mg by mouth daily with breakfast.  12/10/15   Clayton Bibles, PA-C  rivaroxaban (XARELTO) 20 MG TABS tablet Take 1 tablet (20 mg total) by  mouth daily. 05/20/16   Larey Dresser, MD  spironolactone (ALDACTONE) 25 MG tablet Take 1 tablet (25 mg total) by mouth daily. 08/20/15   Larey Dresser, MD  Umeclidinium Bromide (INCRUSE ELLIPTA) 62.5 MCG/INH AEPB Inhale 1 Inhaler into the lungs daily. Patient not taking: Reported on 12/20/2015 10/06/15   Olam Idler, MD    Family History Family History  Problem Relation Age of Onset  . Hypertension Mother     Social History Social History  Substance Use Topics  . Smoking status: Former Smoker    Packs/day: 0.10    Years:  52.00    Types: Cigarettes    Quit date: 09/01/2014  . Smokeless tobacco: Never Used     Comment: 04/19/2015 "probably smoke 1 pack cigarettes/month"  . Alcohol use 0.0 oz/week     Comment: 04/19/2015 "I might drink a 40oz beer/month"     Allergies   Review of patient's allergies indicates no known allergies.   Review of Systems Review of Systems  Constitutional: Negative for fever.  HENT: Negative for nosebleeds.   Eyes: Negative for redness.  Respiratory: Negative for shortness of breath.   Cardiovascular: Negative for chest pain.  Gastrointestinal: Negative for abdominal pain, nausea and vomiting.  Genitourinary: Negative for flank pain.  Musculoskeletal: Positive for neck pain.  Skin: Negative for wound.  Neurological: Negative for syncope, weakness, numbness and headaches.  Hematological: Does not bruise/bleed easily.  Psychiatric/Behavioral: Negative for confusion.     Physical Exam Updated Vital Signs BP 126/87 (BP Location: Left Arm)   Pulse 74   Temp 97.9 F (36.6 C) (Oral)   Resp 18   Ht 6' (1.829 m)   Wt 91.6 kg   SpO2 100%   BMI 27.40 kg/m   Physical Exam  Constitutional: He is oriented to person, place, and time. He appears well-developed and well-nourished. No distress.  HENT:  Head: Atraumatic.  Mouth/Throat: Oropharynx is clear and moist.  No facial or scalp pain, sts, or tenderness.   Eyes: Pupils are equal, round,  and reactive to light.  Neck: Neck supple. No tracheal deviation present.  Cardiovascular: Normal rate, regular rhythm, normal heart sounds and intact distal pulses.   No murmur heard. Pulmonary/Chest: Effort normal and breath sounds normal. No accessory muscle usage. No respiratory distress. He exhibits no tenderness.  Abdominal: Soft. Bowel sounds are normal. He exhibits no distension. There is no tenderness.  Musculoskeletal: Normal range of motion.  Mid cervical tenderness, and left trapezius muscular tenderness, otherwise, CTLS spine, non tender, aligned, no step off. Good rom bil ext without pain or focal bony tenderness.   Neurological: He is alert and oriented to person, place, and time.  Motor intact bil, stre 5/5. sens grossly intact. Steady gait.   Skin: Skin is warm and dry.  Psychiatric: He has a normal mood and affect.  Nursing note and vitals reviewed.    ED Treatments / Results   Radiology   Ct Cervical Spine Wo Contrast  Result Date: 05/27/2016 CLINICAL DATA:  Pain after fall. EXAM: CT CERVICAL SPINE WITHOUT CONTRAST TECHNIQUE: Multidetector CT imaging of the cervical spine was performed without intravenous contrast. Multiplanar CT image reconstructions were also generated. COMPARISON:  None. FINDINGS: Reversal of normal lordosis centered at C5-6. No other malalignment. Multilevel degenerative changes are identified most marked at C5-6 and C6-7 with anterior and small posterior osteophytes. No fractures are identified. There is mild narrowing of the left C5-6 neural foramen due to osteophytosis. Mild narrowing of the spinal canal at C5-6 with a 9 mm AP diameter. IMPRESSION: 1. No fracture or traumatic malalignment. 2. Multilevel degenerative changes most marked at C5-6 and C6-7 with mild canal narrowing at C5-6 and mild narrowing of the left neural foramen. Electronically Signed   By: Dorise Bullion III M.D   On: 05/27/2016 13:41    Procedures Procedures (including  critical care time)  Medications Ordered in ED Medications  HYDROcodone-acetaminophen (NORCO/VICODIN) 5-325 MG per tablet 2 tablet (not administered)     Initial Impression / Assessment and Plan / ED Course  I have reviewed the triage vital signs  and the nursing notes.  Pertinent labs & imaging results that were available during my care of the patient were reviewed by me and considered in my medical decision making (see chart for details).  Clinical Course    CT ordered.   Hydrocodone po for pain.  Pain on anticoag therapy - denies any abd bruising or bleeding.  Denies hitting head. No head injury or contusion on exam. No headache.   Recheck patient - comfortable appearing. Recheck spine, no consistent, focal, midline tenderness.   Discussed ct w pt.    Patient currently appears stable for d/c.  Rec pcp f/u, return precautions provided.    Final Clinical Impressions(s) / ED Diagnoses   Final diagnoses:  None    New Prescriptions New Prescriptions   No medications on file     Lajean Saver, MD 05/27/16 1359

## 2016-05-28 DIAGNOSIS — Z5181 Encounter for therapeutic drug level monitoring: Secondary | ICD-10-CM | POA: Diagnosis not present

## 2016-05-31 ENCOUNTER — Emergency Department (HOSPITAL_COMMUNITY): Payer: Medicare Other

## 2016-05-31 ENCOUNTER — Emergency Department (HOSPITAL_COMMUNITY)
Admission: EM | Admit: 2016-05-31 | Discharge: 2016-05-31 | Disposition: A | Discharge status: Home / Self Care | Payer: Medicare Other | Attending: Emergency Medicine | Admitting: Emergency Medicine

## 2016-05-31 ENCOUNTER — Encounter (HOSPITAL_COMMUNITY): Payer: Self-pay | Admitting: Emergency Medicine

## 2016-05-31 DIAGNOSIS — J449 Chronic obstructive pulmonary disease, unspecified: Secondary | ICD-10-CM | POA: Diagnosis not present

## 2016-05-31 DIAGNOSIS — Z7901 Long term (current) use of anticoagulants: Secondary | ICD-10-CM | POA: Diagnosis not present

## 2016-05-31 DIAGNOSIS — M542 Cervicalgia: Secondary | ICD-10-CM | POA: Insufficient documentation

## 2016-05-31 DIAGNOSIS — Z87891 Personal history of nicotine dependence: Secondary | ICD-10-CM | POA: Insufficient documentation

## 2016-05-31 DIAGNOSIS — Z85038 Personal history of other malignant neoplasm of large intestine: Secondary | ICD-10-CM | POA: Insufficient documentation

## 2016-05-31 DIAGNOSIS — I110 Hypertensive heart disease with heart failure: Secondary | ICD-10-CM | POA: Diagnosis not present

## 2016-05-31 DIAGNOSIS — S0990XA Unspecified injury of head, initial encounter: Secondary | ICD-10-CM | POA: Diagnosis not present

## 2016-05-31 DIAGNOSIS — G8929 Other chronic pain: Secondary | ICD-10-CM | POA: Insufficient documentation

## 2016-05-31 DIAGNOSIS — R51 Headache: Secondary | ICD-10-CM | POA: Diagnosis not present

## 2016-05-31 DIAGNOSIS — S199XXA Unspecified injury of neck, initial encounter: Secondary | ICD-10-CM | POA: Diagnosis not present

## 2016-05-31 DIAGNOSIS — I2510 Atherosclerotic heart disease of native coronary artery without angina pectoris: Secondary | ICD-10-CM | POA: Insufficient documentation

## 2016-05-31 DIAGNOSIS — I5022 Chronic systolic (congestive) heart failure: Secondary | ICD-10-CM | POA: Diagnosis not present

## 2016-05-31 DIAGNOSIS — W19XXXA Unspecified fall, initial encounter: Secondary | ICD-10-CM

## 2016-05-31 MED ORDER — KETOROLAC TROMETHAMINE 60 MG/2ML IM SOLN
30.0000 mg | Freq: Once | INTRAMUSCULAR | Status: AC
  Administered 2016-05-31: 30 mg via INTRAMUSCULAR
  Filled 2016-05-31: qty 2

## 2016-05-31 MED ORDER — LIDOCAINE 5 % EX PTCH: 1.0000 | MEDICATED_PATCH | CUTANEOUS | 0 refills | Status: DC

## 2016-05-31 MED ORDER — ACETAMINOPHEN 500 MG PO TABS
1000.0000 mg | ORAL_TABLET | Freq: Once | ORAL | Status: AC
Start: 2016-05-31 — End: 2016-05-31
  Administered 2016-05-31: 1000 mg via ORAL
  Filled 2016-05-31: qty 2

## 2016-05-31 NOTE — Discharge Instructions (Signed)
You have been seen today for neck pain and a headache from a fall. Your imaging and lab tests showed no acute abnormalities. Follow up with PCP as soon as possible. Use Tylenol, ibuprofen, naproxen, along with ice to reduce pain and inflammation. Return to ED should symptoms worsen.

## 2016-05-31 NOTE — ED Provider Notes (Signed)
[facility] DEPT Provider Note   CSN: [id number] Arrival date & time: 05/31/16  1437  First Provider Contact:  First MD Initiated Contact with Patient 05/31/16 1507        History   Chief Complaint Chief Complaint  Patient presents with  . Fall    HPI Paul Clayton is a 70 y.o. male.  HPI   Paul Clayton is a 70 y.o. male, with a history of CHF, hypertension, chronic neck and back pain, and COPD, presenting to the ED with injuries from a fall that occurred about 14 hours ago at around 0130 this morning. Pt was walking back from the bathroom, when his left leg "gave out." Pt is supposed to be walking with a cane, but was not using it when he fell. Patient has had problems with this leg before. Pt states he does not know if he hit his head.  Pt complains of neck and upper back pain, but states that this pain was present since his last fall on August 8, for which he was seen in the ED at that time. Pain is achy, moderate to severe, nonradiating. Pt has taken hydrocodone with no relief.  Patient's new pain is a headache, bilateral, rates it 10/10, intermittent. Denies LOC, visual disturbances, dizziness, N/V, recent illness, neuro deficits, or any other complaints. Pt states multiple times that he does not want narcotic pain medications.    Past Medical History:  Diagnosis Date  . 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 Anxiety   . Arthritis    "left hip" (04/19/2015)  . CAD (coronary artery disease)    a. LHC 1/16:  LM 20%, oLAD 30%, oLCx 30%, pRCA 30%  . Chronic lower back pain   . Chronic systolic CHF (congestive heart failure) (HCC)    a. EF previously 15%; b. Echo 7/16:  EF 30-35%, diff HK, gr 1 DD, mild MR, mild LAE, mild reduced RVSF  . COPD (chronic obstructive pulmonary disease) ([place])   . Depression   . GERD (gastroesophageal reflux disease)   . Hepatitis C   . Hypertension   . Migraine    "last one was back in the 1980's" (04/19/2015)  . NICM (nonischemic  cardiomyopathy) ([place])    no obs CAD on LHC in 1/16 - ? HTN or substance abuse  . Persistent atrial fibrillation (HCC)    a. s/p DCCV; b. Amiodarone  . Substance abuse    ETOH; crack cocaine  . Tobacco abuse     Patient Active Problem List   Diagnosis Date Noted  . Urinary incontinence 05/03/2015  . LBP radiating to left leg 03/12/2015  . Nonischemic cardiomyopathy ([place]) 03/05/2015  . Pain in the chest 03/01/2015  . Elevated troponin   . Paroxysmal atrial fibrillation ([place]) 02/25/2015  . HF (heart failure) ([place]) 11/10/2014  . Near syncope 11/09/2014  . Hx of substance abuse 10/30/2014  . SOB (shortness of breath)   . Acute decompensated heart failure ([place])   . Systolic CHF, chronic ([place])   . Atrial fibrillation with rapid ventricular response ([place])   . Faintness   . Chronic systolic heart failure ([place])   . Persistent atrial fibrillation ([place])   . Chronic systolic CHF (congestive heart failure) ([place]) 10/17/2014  . Homeless single person   . Atrial fibrillation with RVR ([place]) 10/13/2014  . High risk social situation 09/30/2014  . Colon cancer ([place]) 09/26/2014  . Dyspnea 09/22/2014  . Chest pain 09/16/2014  . Syncope   .  COPD exacerbation ([place])   . HFrEF (heart failure with reduced ejection fraction) ([place]) 09/12/2014  . A-fib ([place]) 09/12/2014  . COPD (chronic obstructive pulmonary disease) ([place]) 09/12/2014    Past Surgical History:  Procedure Laterality Date  . CARDIAC CATHETERIZATION  10/23/2014  . CARDIOVERSION     "I've had 2 in all" (04/19/2015)  . CARDIOVERSION N/A 01/30/2015   Procedure: CARDIOVERSION;  Surgeon: Larey Dresser, MD;  Location: [facility];  Service: Cardiovascular;  Laterality: N/A;  . COLONOSCOPY    . COLONOSCOPY WITH PROPOFOL N/A 11/30/2014   Procedure: COLONOSCOPY WITH PROPOFOL;  Surgeon: Milus Banister, MD;  Location: [facility] END[facility];  Service: Endoscopy;  Laterality: N/A;  . LEFT AND RIGHT HEART CATHETERIZATION WITH CORONARY ANGIOGRAM N/A 10/23/2014     Procedure: LEFT AND RIGHT HEART CATHETERIZATION WITH CORONARY ANGIOGRAM;  Surgeon: Larey Dresser, MD;  Location: [facility] CATH LAB;  Service: Cardiovascular;  Laterality: N/A;       Home Medications    Prior to Admission medications   Medication Sig Start Date End Date Taking? Authorizing Provider  acetaminophen (TYLENOL) 325 MG tablet Take 2 tablets (650 mg total) by mouth every 6 (six) hours as needed. 12/20/15  Yes Nona Dell, PA-C  amiodarone (PACERONE) 200 MG tablet Take 1 tablet (200 mg total) by mouth daily. 08/20/15  Yes Larey Dresser, MD  atorvastatin (LIPITOR) 20 MG tablet Take 1 tablet (20 mg total) by mouth daily. 08/20/15  Yes Larey Dresser, MD  carvedilol (COREG) 3.125 MG tablet TAKE 1 TABLET (3.125 MG TOTAL) BY MOUTH TWO TIMES DAILY WITH A MEAL. 12/17/15  Yes Larey Dresser, MD  furosemide (LASIX) 40 MG tablet TAKE 1 TABLET BY MOUTH   DAILY Patient taking differently: TAKE40 MG BY MOUTH   DAILY 05/16/15  Yes Larey Dresser, MD  gabapentin (NEURONTIN) 300 MG capsule Take 1 capsule (300 mg total) by mouth 3 (three) times daily. 04/30/16  Yes Montine Circle, PA-C  HYDROcodone-acetaminophen (NORCO/VICODIN) 5-325 MG tablet Take 1-2 tablets by mouth every 6 (six) hours as needed for moderate pain. 05/27/16  Yes Lajean Saver, MD  losartan (COZAAR) 25 MG tablet Take 1 tablet (25 mg total) by mouth daily. 08/23/15  Yes Larey Dresser, MD  mometasone-formoterol [facility]) 200-5 MCG/ACT AERO Inhale 2 puffs into the lungs 2 (two) times daily as needed for wheezing. 01/06/15  Yes Leone Brand, MD  pantoprazole (PROTONIX) 40 MG tablet Take 1 tablet (40 mg total) by mouth daily. 08/20/15  Yes Larey Dresser, MD  rivaroxaban (XARELTO) 20 MG TABS tablet Take 1 tablet (20 mg total) by mouth daily. 05/20/16  Yes Larey Dresser, MD  spironolactone (ALDACTONE) 25 MG tablet Take 1 tablet (25 mg total) by mouth daily. 08/20/15  Yes Larey Dresser, MD  Umeclidinium Bromide (INCRUSE ELLIPTA)  62.5 MCG/INH AEPB Inhale 1 Inhaler into the lungs daily. 10/06/15  Yes Olam Idler, MD  lidocaine (LIDODERM) 5 % Place 1 patch onto the skin daily. Remove & Discard patch within 12 hours or as directed by MD 05/31/16   Lorayne Bender, PA-C  predniSONE (DELTASONE) 20 MG tablet 3 tabs po daily x 3 days, then 2 tabs x 3 days, then 1.5 tabs x 3 days, then 1 tab x 3 days, then 0.5 tabs x 3 days Patient taking differently: Take 20 mg by mouth daily with breakfast.  12/10/15   Clayton Bibles, PA-C    Family History Family History  Problem Relation Age  of Onset  . Hypertension Mother     Social History Social History  Substance Use Topics  . Smoking status: Former Smoker    Packs/day: 0.10    Years: 52.00    Types: Cigarettes    Quit date: 09/01/2014  . Smokeless tobacco: Never Used     Comment: 04/19/2015 "probably smoke 1 pack cigarettes/month"  . Alcohol use 0.0 oz/week     Comment: 04/19/2015 "I might drink a 40oz beer/month"     Allergies   Review of patient's allergies indicates no known allergies.   Review of Systems Review of Systems  Constitutional: Negative for chills and fever.  Eyes: Negative for visual disturbance.  Respiratory: Negative for shortness of breath.   Cardiovascular: Negative for chest pain.  Gastrointestinal: Negative for nausea and vomiting.  Musculoskeletal: Positive for back pain and neck pain. Negative for arthralgias.  Neurological: Positive for headaches. Negative for dizziness, syncope, weakness, light-headedness and numbness.  All other systems reviewed and are negative.    Physical Exam Updated Vital Signs BP 121/66   Pulse 82   Temp 98.6 F (37 C)   Resp 18   SpO2 98%   Physical Exam  Constitutional: He is oriented to person, place, and time. He appears well-developed and well-nourished. No distress.  HENT:  Head: Normocephalic and atraumatic.  Eyes: Conjunctivae and EOM are normal. Pupils are equal, round, and reactive to light.  Neck:  Normal range of motion. Neck supple.  Neck ROM tested after clear cervical spine CT.  Cardiovascular: Normal rate, regular rhythm, normal heart sounds and intact distal pulses.   Pulmonary/Chest: Effort normal and breath sounds normal. No respiratory distress.  Abdominal: Soft. There is no tenderness. There is no guarding.  Musculoskeletal: He exhibits no edema or tenderness.  Tenderness to bilateral cervical and upper thoracic musculature. Full ROM in all extremities and spine. No midline spinal tenderness.   Lymphadenopathy:    He has no cervical adenopathy.  Neurological: He is alert and oriented to person, place, and time. He has normal reflexes.  No sensory deficits. Strength 5/5 in all extremities. No gait disturbance from patient's baseline. Coordination intact. Cranial nerves III-XII grossly intact. No facial droop.   Skin: Skin is warm and dry. He is not diaphoretic.  Psychiatric: He has a normal mood and affect. His behavior is normal.  Nursing note and vitals reviewed.    ED Treatments / Results  Labs (all labs ordered are listed, but only abnormal results are displayed) Labs Reviewed - No data to display  EKG  EKG Interpretation None       Radiology Ct Head Wo Contrast  Result Date: 05/31/2016 CLINICAL DATA:  Fall at home, unwitnessed brief loss of consciousness per patient, headache and neck pain, also recent fall 2 days ago, history CHF, hypertension, atrial fibrillation, coronary artery disease, COPD, smoking history, on Xarelto EXAM: CT HEAD WITHOUT CONTRAST CT CERVICAL SPINE WITHOUT CONTRAST TECHNIQUE: Multidetector CT imaging of the head and cervical spine was performed following the standard protocol without intravenous contrast. Multiplanar CT image reconstructions of the cervical spine were also generated. COMPARISON:  CT head 10/22/2014, CT cervical spine 05/27/2016 FINDINGS: CT HEAD FINDINGS Normal ventricular morphology. No midline shift or mass effect. Normal  appearance of brain parenchyma. No intracranial hemorrhage, mass lesion, evidence of acute infarction, or extra-axial fluid collection. Visualized paranasal sinuses and mastoid air cells clear. Bones unremarkable. CT CERVICAL SPINE FINDINGS Prevertebral soft tissues normal thickness. Disc space narrowing and endplate spur formation C4 C5  through C6-C7. Minimal retrolisthesis at C5-C6 and C6-C7 unchanged. Vertebral body heights maintained without fracture or additional subluxation. Scattered facet degenerative changes bilaterally. Skullbase intact. IMPRESSION: No acute intracranial abnormalities. Degenerative disc and facet disease changes cervical spine. No acute cervical spine abnormalities. Electronically Signed   By: Lavonia Dana M.D.   On: 05/31/2016 16:29   Ct Cervical Spine Wo Contrast  Result Date: 05/31/2016 CLINICAL DATA:  Fall at home, unwitnessed brief loss of consciousness per patient, headache and neck pain, also recent fall 2 days ago, history CHF, hypertension, atrial fibrillation, coronary artery disease, COPD, smoking history, on Xarelto EXAM: CT HEAD WITHOUT CONTRAST CT CERVICAL SPINE WITHOUT CONTRAST TECHNIQUE: Multidetector CT imaging of the head and cervical spine was performed following the standard protocol without intravenous contrast. Multiplanar CT image reconstructions of the cervical spine were also generated. COMPARISON:  CT head 10/22/2014, CT cervical spine 05/27/2016 FINDINGS: CT HEAD FINDINGS Normal ventricular morphology. No midline shift or mass effect. Normal appearance of brain parenchyma. No intracranial hemorrhage, mass lesion, evidence of acute infarction, or extra-axial fluid collection. Visualized paranasal sinuses and mastoid air cells clear. Bones unremarkable. CT CERVICAL SPINE FINDINGS Prevertebral soft tissues normal thickness. Disc space narrowing and endplate spur formation C4 C5 through C6-C7. Minimal retrolisthesis at C5-C6 and C6-C7 unchanged. Vertebral body  heights maintained without fracture or additional subluxation. Scattered facet degenerative changes bilaterally. Skullbase intact. IMPRESSION: No acute intracranial abnormalities. Degenerative disc and facet disease changes cervical spine. No acute cervical spine abnormalities. Electronically Signed   By: Lavonia Dana M.D.   On: 05/31/2016 16:29    Procedures Procedures (including critical care time)  Medications Ordered in ED Medications  acetaminophen (TYLENOL) tablet 1,000 mg (1,000 mg Oral Given 05/31/16 1541)  ketorolac (TORADOL) injection 30 mg (30 mg Intramuscular Given 05/31/16 1716)     Initial Impression / Assessment and Plan / ED Course  I have reviewed the triage vital signs and the nursing notes.  Pertinent labs & imaging results that were available during my care of the patient were reviewed by me and considered in my medical decision making (see chart for details).  Clinical Course    RHIAN YEARTA presents with a fall on Xarelto occurring early this morning. Complains of headache and neck pain.  Findings and plan of care discussed with Nat Christen, MD. Dr. Lacinda Axon personally evaluated and examined this patient.  Pt may have sustained a minor head injury. No acute changes on CT. Patient ambulated without difficulty using his cane, which is his baseline mode of mobilization.     Vitals:   05/31/16 1515 05/31/16 1530 05/31/16 1545 05/31/16 1600  BP: 124/80 123/81 127/81 131/80  Pulse: 93 82 86 88  Resp:      Temp:      SpO2: 99% 100% 98% 99%      Final Clinical Impressions(s) / ED Diagnoses   Final diagnoses:  Fall, initial encounter  Neck pain, chronic    New Prescriptions New Prescriptions   LIDOCAINE (LIDODERM) 5 %    Place 1 patch onto the skin daily. Remove & Discard patch within 12 hours or as directed by MD     Lorayne Bender, PA-C 05/31/16 1751    Nat Christen, MD 06/01/16 0030

## 2016-05-31 NOTE — ED Notes (Signed)
Pt ambulated with his cane.

## 2016-05-31 NOTE — ED Triage Notes (Addendum)
Per [facility]- Pt seen here two days ago for fall. Pt given C-collar for comfort. Pt had a new fall today, mechanical fall down two stairs, and hit his neck again. Pt was not wearing his C-collar. Pt states he is "immune" to vicodin and needs a stronger pain medication and that fall today made his original pain worse. Pt also reports he is supposed to be going to a pain clinic but has not been able to yet.

## 2016-06-02 ENCOUNTER — Encounter (HOSPITAL_COMMUNITY): Payer: Self-pay | Admitting: Emergency Medicine

## 2016-06-02 ENCOUNTER — Emergency Department (HOSPITAL_COMMUNITY)
Admission: EM | Admit: 2016-06-02 | Discharge: 2016-06-02 | Disposition: A | Discharge status: Home / Self Care | Payer: Medicare Other | Attending: Emergency Medicine | Admitting: Emergency Medicine

## 2016-06-02 DIAGNOSIS — Z87891 Personal history of nicotine dependence: Secondary | ICD-10-CM | POA: Diagnosis not present

## 2016-06-02 DIAGNOSIS — M5410 Radiculopathy, site unspecified: Secondary | ICD-10-CM | POA: Diagnosis not present

## 2016-06-02 DIAGNOSIS — Z85038 Personal history of other malignant neoplasm of large intestine: Secondary | ICD-10-CM | POA: Insufficient documentation

## 2016-06-02 DIAGNOSIS — G8929 Other chronic pain: Secondary | ICD-10-CM | POA: Insufficient documentation

## 2016-06-02 DIAGNOSIS — Z79899 Other long term (current) drug therapy: Secondary | ICD-10-CM | POA: Insufficient documentation

## 2016-06-02 DIAGNOSIS — I5022 Chronic systolic (congestive) heart failure: Secondary | ICD-10-CM | POA: Insufficient documentation

## 2016-06-02 DIAGNOSIS — T148 Other injury of unspecified body region: Secondary | ICD-10-CM | POA: Diagnosis not present

## 2016-06-02 DIAGNOSIS — I2510 Atherosclerotic heart disease of native coronary artery without angina pectoris: Secondary | ICD-10-CM | POA: Insufficient documentation

## 2016-06-02 DIAGNOSIS — I110 Hypertensive heart disease with heart failure: Secondary | ICD-10-CM | POA: Diagnosis not present

## 2016-06-02 DIAGNOSIS — Z5181 Encounter for therapeutic drug level monitoring: Secondary | ICD-10-CM | POA: Diagnosis not present

## 2016-06-02 DIAGNOSIS — Z7901 Long term (current) use of anticoagulants: Secondary | ICD-10-CM | POA: Diagnosis not present

## 2016-06-02 DIAGNOSIS — M542 Cervicalgia: Secondary | ICD-10-CM | POA: Insufficient documentation

## 2016-06-02 DIAGNOSIS — J449 Chronic obstructive pulmonary disease, unspecified: Secondary | ICD-10-CM | POA: Insufficient documentation

## 2016-06-02 MED ORDER — DIAZEPAM 5 MG PO TABS
5.0000 mg | ORAL_TABLET | Freq: Once | ORAL | Status: AC
  Administered 2016-06-02: 5 mg via ORAL
  Filled 2016-06-02: qty 1

## 2016-06-02 MED ORDER — METHYLPREDNISOLONE 4 MG PO TBPK: ORAL_TABLET | ORAL | 0 refills | Status: DC

## 2016-06-02 MED ORDER — KETOROLAC TROMETHAMINE 15 MG/ML IJ SOLN
15.0000 mg | Freq: Once | INTRAMUSCULAR | Status: AC
  Administered 2016-06-02: 15 mg via INTRAMUSCULAR
  Filled 2016-06-02: qty 1

## 2016-06-02 NOTE — ED Triage Notes (Signed)
Brought by EMS from home for c/o neck pain.  Had a fall 2 days ago and was seen here.  Did not fill prescription for lidoderm patches.  Has not taken anything for pain tonight.  States I was told to call EMS if my pain got worse.  Patient put ccollar on at home to "support" his neck.

## 2016-06-02 NOTE — ED Provider Notes (Signed)
[facility] DEPT Provider Note   CSN: [id number] Arrival date & time: 06/02/16  [id number]  First Provider Contact:  First MD Initiated Contact with Patient 06/02/16 0425        History   Chief Complaint Chief Complaint  Patient presents with  . Neck Pain    HPI Paul Clayton is a 70 y.o. male.  HPI   This is a 70 year old male who presents with neck pain. Patient with a history of chronic neck and back pain. However, he reports a fall 2 days ago. He has been seen and evaluated and was discharged with lidocaine patch. Patient reports that he was unable to get his prescription for lidocaine patch refilled. He reports 10 out of 10 right-sided neck pain that radiates down into his right arm. He denies weakness. He denies any numbness or tingling. States that his chronic pain is normally on the left.  Past Medical History:  Diagnosis Date  . 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 Anxiety   . Arthritis    "left hip" (04/19/2015)  . CAD (coronary artery disease)    a. LHC 1/16:  LM 20%, oLAD 30%, oLCx 30%, pRCA 30%  . Chronic lower back pain   . Chronic systolic CHF (congestive heart failure) (HCC)    a. EF previously 15%; b. Echo 7/16:  EF 30-35%, diff HK, gr 1 DD, mild MR, mild LAE, mild reduced RVSF  . COPD (chronic obstructive pulmonary disease) ([place])   . Depression   . GERD (gastroesophageal reflux disease)   . Hepatitis C   . Hypertension   . Migraine    "last one was back in the 1980's" (04/19/2015)  . NICM (nonischemic cardiomyopathy) ([place])    no obs CAD on LHC in 1/16 - ? HTN or substance abuse  . Persistent atrial fibrillation (HCC)    a. s/p DCCV; b. Amiodarone  . Substance abuse    ETOH; crack cocaine  . Tobacco abuse     Patient Active Problem List   Diagnosis Date Noted  . Urinary incontinence 05/03/2015  . LBP radiating to left leg 03/12/2015  . Nonischemic cardiomyopathy ([place]) 03/05/2015  . Pain in the chest 03/01/2015  . Elevated troponin   .  Paroxysmal atrial fibrillation ([place]) 02/25/2015  . HF (heart failure) ([place]) 11/10/2014  . Near syncope 11/09/2014  . Hx of substance abuse 10/30/2014  . SOB (shortness of breath)   . Acute decompensated heart failure ([place])   . Systolic CHF, chronic ([place])   . Atrial fibrillation with rapid ventricular response ([place])   . Faintness   . Chronic systolic heart failure ([place])   . Persistent atrial fibrillation ([place])   . Chronic systolic CHF (congestive heart failure) ([place]) 10/17/2014  . Homeless single person   . Atrial fibrillation with RVR ([place]) 10/13/2014  . High risk social situation 09/30/2014  . Colon cancer ([place]) 09/26/2014  . Dyspnea 09/22/2014  . Chest pain 09/16/2014  . Syncope   . COPD exacerbation ([place])   . HFrEF (heart failure with reduced ejection fraction) ([place]) 09/12/2014  . A-fib ([place]) 09/12/2014  . COPD (chronic obstructive pulmonary disease) ([place]) 09/12/2014    Past Surgical History:  Procedure Laterality Date  . CARDIAC CATHETERIZATION  10/23/2014  . CARDIOVERSION     "I've had 2 in all" (04/19/2015)  . CARDIOVERSION N/A 01/30/2015   Procedure: CARDIOVERSION;  Surgeon: Larey Dresser, MD;  Location: [facility];  Service: Cardiovascular;  Laterality: N/A;  .  COLONOSCOPY    . COLONOSCOPY WITH PROPOFOL N/A 11/30/2014   Procedure: COLONOSCOPY WITH PROPOFOL;  Surgeon: Milus Banister, MD;  Location: [facility] ENDOSCOPY;  Service: Endoscopy;  Laterality: N/A;  . LEFT AND RIGHT HEART CATHETERIZATION WITH CORONARY ANGIOGRAM N/A 10/23/2014   Procedure: LEFT AND RIGHT HEART CATHETERIZATION WITH CORONARY ANGIOGRAM;  Surgeon: Larey Dresser, MD;  Location: [facility] CATH LAB;  Service: Cardiovascular;  Laterality: N/A;       Home Medications    Prior to Admission medications   Medication Sig Start Date End Date Taking? Authorizing Provider  acetaminophen (TYLENOL) 325 MG tablet Take 2 tablets (650 mg total) by mouth every 6 (six) hours as needed. 12/20/15  Yes Nona Dell,  PA-C  amiodarone (PACERONE) 200 MG tablet Take 1 tablet (200 mg total) by mouth daily. 08/20/15  Yes Larey Dresser, MD  atorvastatin (LIPITOR) 20 MG tablet Take 1 tablet (20 mg total) by mouth daily. 08/20/15  Yes Larey Dresser, MD  carvedilol (COREG) 3.125 MG tablet TAKE 1 TABLET (3.125 MG TOTAL) BY MOUTH TWO TIMES DAILY WITH A MEAL. 12/17/15  Yes Larey Dresser, MD  furosemide (LASIX) 40 MG tablet TAKE 1 TABLET BY MOUTH   DAILY Patient taking differently: TAKE 40 MG BY MOUTH   DAILY 05/16/15  Yes Larey Dresser, MD  gabapentin (NEURONTIN) 300 MG capsule Take 1 capsule (300 mg total) by mouth 3 (three) times daily. 04/30/16  Yes Montine Circle, PA-C  HYDROcodone-acetaminophen (NORCO/VICODIN) 5-325 MG tablet Take 1-2 tablets by mouth every 6 (six) hours as needed for moderate pain. 05/27/16  Yes Lajean Saver, MD  lidocaine (LIDODERM) 5 % Place 1 patch onto the skin daily. Remove & Discard patch within 12 hours or as directed by MD 05/31/16  Yes Shawn C Joy, PA-C  losartan (COZAAR) 25 MG tablet Take 1 tablet (25 mg total) by mouth daily. 08/23/15  Yes Larey Dresser, MD  mometasone-formoterol [facility]) 200-5 MCG/ACT AERO Inhale 2 puffs into the lungs 2 (two) times daily as needed for wheezing. 01/06/15  Yes Leone Brand, MD  pantoprazole (PROTONIX) 40 MG tablet Take 1 tablet (40 mg total) by mouth daily. 08/20/15  Yes Larey Dresser, MD  predniSONE (DELTASONE) 20 MG tablet 3 tabs po daily x 3 days, then 2 tabs x 3 days, then 1.5 tabs x 3 days, then 1 tab x 3 days, then 0.5 tabs x 3 days Patient taking differently: Take 20 mg by mouth daily with breakfast.  12/10/15  Yes Clayton Bibles, PA-C  rivaroxaban (XARELTO) 20 MG TABS tablet Take 1 tablet (20 mg total) by mouth daily. 05/20/16  Yes Larey Dresser, MD  spironolactone (ALDACTONE) 25 MG tablet Take 1 tablet (25 mg total) by mouth daily. 08/20/15  Yes Larey Dresser, MD  Umeclidinium Bromide (INCRUSE ELLIPTA) 62.5 MCG/INH AEPB Inhale 1 Inhaler into the  lungs daily. 10/06/15  Yes Olam Idler, MD  methylPREDNISolone (MEDROL DOSEPAK) 4 MG TBPK tablet Take as directed on package. 06/02/16   Merryl Hacker, MD    Family History Family History  Problem Relation Age of Onset  . Hypertension Mother     Social History Social History  Substance Use Topics  . Smoking status: Former Smoker    Packs/day: 0.10    Years: 52.00    Types: Cigarettes    Quit date: 09/01/2014  . Smokeless tobacco: Never Used     Comment: 04/19/2015 "probably smoke 1 pack cigarettes/month"  . Alcohol  use 0.0 oz/week     Comment: 04/19/2015 "I might drink a 40oz beer/month"     Allergies   Review of patient's allergies indicates no known allergies.   Review of Systems Review of Systems  Musculoskeletal: Positive for neck pain. Negative for neck stiffness.  Neurological: Negative for weakness and numbness.  All other systems reviewed and are negative.    Physical Exam Updated Vital Signs BP 125/80   Pulse 70   Temp 98.8 F (37.1 C) (Oral)   Resp 16   Ht 6' (1.829 m)   Wt 206 lb (93.4 kg)   SpO2 100%   BMI 27.94 kg/m   Physical Exam  Constitutional: He is oriented to person, place, and time.  Holding his hand over his right neck, no acute distress  HENT:  Head: Normocephalic and atraumatic.  Eyes: Pupils are equal, round, and reactive to light.  Neck: Normal range of motion.  Tendency to keep neck rotated left, no obvious torticollis, no midline C-spine tenderness  Cardiovascular: Normal rate, regular rhythm and normal heart sounds.   No murmur heard. Pulmonary/Chest: Effort normal. No respiratory distress.  Musculoskeletal:  5 out of 5 grip, biceps, triceps strength bilaterally  Lymphadenopathy:    He has no cervical adenopathy.  Neurological: He is alert and oriented to person, place, and time.  Skin: Skin is warm and dry.  Psychiatric: He has a normal mood and affect.  Nursing note and vitals reviewed.    ED Treatments /  Results  Labs (all labs ordered are listed, but only abnormal results are displayed) Labs Reviewed - No data to display  EKG  EKG Interpretation None       Radiology Ct Head Wo Contrast  Result Date: 05/31/2016 CLINICAL DATA:  Fall at home, unwitnessed brief loss of consciousness per patient, headache and neck pain, also recent fall 2 days ago, history CHF, hypertension, atrial fibrillation, coronary artery disease, COPD, smoking history, on Xarelto EXAM: CT HEAD WITHOUT CONTRAST CT CERVICAL SPINE WITHOUT CONTRAST TECHNIQUE: Multidetector CT imaging of the head and cervical spine was performed following the standard protocol without intravenous contrast. Multiplanar CT image reconstructions of the cervical spine were also generated. COMPARISON:  CT head 10/22/2014, CT cervical spine 05/27/2016 FINDINGS: CT HEAD FINDINGS Normal ventricular morphology. No midline shift or mass effect. Normal appearance of brain parenchyma. No intracranial hemorrhage, mass lesion, evidence of acute infarction, or extra-axial fluid collection. Visualized paranasal sinuses and mastoid air cells clear. Bones unremarkable. CT CERVICAL SPINE FINDINGS Prevertebral soft tissues normal thickness. Disc space narrowing and endplate spur formation C4 C5 through C6-C7. Minimal retrolisthesis at C5-C6 and C6-C7 unchanged. Vertebral body heights maintained without fracture or additional subluxation. Scattered facet degenerative changes bilaterally. Skullbase intact. IMPRESSION: No acute intracranial abnormalities. Degenerative disc and facet disease changes cervical spine. No acute cervical spine abnormalities. Electronically Signed   By: Lavonia Dana M.D.   On: 05/31/2016 16:29   Ct Cervical Spine Wo Contrast  Result Date: 05/31/2016 CLINICAL DATA:  Fall at home, unwitnessed brief loss of consciousness per patient, headache and neck pain, also recent fall 2 days ago, history CHF, hypertension, atrial fibrillation, coronary artery  disease, COPD, smoking history, on Xarelto EXAM: CT HEAD WITHOUT CONTRAST CT CERVICAL SPINE WITHOUT CONTRAST TECHNIQUE: Multidetector CT imaging of the head and cervical spine was performed following the standard protocol without intravenous contrast. Multiplanar CT image reconstructions of the cervical spine were also generated. COMPARISON:  CT head 10/22/2014, CT cervical spine 05/27/2016 FINDINGS: CT HEAD  FINDINGS Normal ventricular morphology. No midline shift or mass effect. Normal appearance of brain parenchyma. No intracranial hemorrhage, mass lesion, evidence of acute infarction, or extra-axial fluid collection. Visualized paranasal sinuses and mastoid air cells clear. Bones unremarkable. CT CERVICAL SPINE FINDINGS Prevertebral soft tissues normal thickness. Disc space narrowing and endplate spur formation C4 C5 through C6-C7. Minimal retrolisthesis at C5-C6 and C6-C7 unchanged. Vertebral body heights maintained without fracture or additional subluxation. Scattered facet degenerative changes bilaterally. Skullbase intact. IMPRESSION: No acute intracranial abnormalities. Degenerative disc and facet disease changes cervical spine. No acute cervical spine abnormalities. Electronically Signed   By: Lavonia Dana M.D.   On: 05/31/2016 16:29    Procedures Procedures (including critical care time)  Medications Ordered in ED Medications  ketorolac (TORADOL) 15 MG/ML injection 15 mg (15 mg Intramuscular Given 06/02/16 0439)  diazepam (VALIUM) tablet 5 mg (5 mg Oral Given 06/02/16 0439)     Initial Impression / Assessment and Plan / ED Course  I have reviewed the triage vital signs and the nursing notes.  Pertinent labs & imaging results that were available during my care of the patient were reviewed by me and considered in my medical decision making (see chart for details).  Clinical Course    Patient presents with acute on chronic neck pain. Recent fall. Had a CT scan yesterday that was reassuring.  Reports pain down the right arm. Radicular nature. He is neurologically intact. Patient was given Toradol and Valium. On recheck, he continues to be neurologically intact. Reports improvement of his symptoms. Will prescribe a Medrol Dosepak for inflammation and I have encouraged the patient to get his lidocaine patches filled. Patient stated understanding.  After history, exam, and medical workup I feel the patient has been appropriately medically screened and is safe for discharge home. Pertinent diagnoses were discussed with the patient. Patient was given return precautions.   Final Clinical Impressions(s) / ED Diagnoses   Final diagnoses:  Chronic neck pain  Radicular pain    New Prescriptions New Prescriptions   METHYLPREDNISOLONE (MEDROL DOSEPAK) 4 MG TBPK TABLET    Take as directed on package.     Merryl Hacker, MD 06/02/16 [phone]

## 2016-06-04 DIAGNOSIS — Z5181 Encounter for therapeutic drug level monitoring: Secondary | ICD-10-CM | POA: Diagnosis not present

## 2016-06-09 DIAGNOSIS — Z5181 Encounter for therapeutic drug level monitoring: Secondary | ICD-10-CM | POA: Diagnosis not present

## 2016-06-11 DIAGNOSIS — Z5181 Encounter for therapeutic drug level monitoring: Secondary | ICD-10-CM | POA: Diagnosis not present

## 2016-06-18 DIAGNOSIS — Z5181 Encounter for therapeutic drug level monitoring: Secondary | ICD-10-CM | POA: Diagnosis not present

## 2016-06-20 ENCOUNTER — Encounter: Payer: Self-pay | Admitting: Internal Medicine

## 2016-06-20 ENCOUNTER — Ambulatory Visit (INDEPENDENT_AMBULATORY_CARE_PROVIDER_SITE_OTHER): Payer: Medicare Other | Admitting: Internal Medicine

## 2016-06-20 DIAGNOSIS — M545 Low back pain, unspecified: Secondary | ICD-10-CM

## 2016-06-20 DIAGNOSIS — Z23 Encounter for immunization: Secondary | ICD-10-CM

## 2016-06-20 DIAGNOSIS — Z0000 Encounter for general adult medical examination without abnormal findings: Secondary | ICD-10-CM

## 2016-06-20 DIAGNOSIS — M79605 Pain in left leg: Secondary | ICD-10-CM

## 2016-06-20 MED ORDER — LIDOCAINE 5 % EX PTCH: 1.0000 | MEDICATED_PATCH | CUTANEOUS | 0 refills | Status: DC

## 2016-06-20 MED ORDER — PREGABALIN 50 MG PO CAPS: 50.0000 mg | ORAL_CAPSULE | Freq: Two times a day (BID) | ORAL | 0 refills | Status: DC

## 2016-06-20 MED ORDER — DULOXETINE HCL 30 MG PO CPEP: 30.0000 mg | ORAL_CAPSULE | Freq: Every day | ORAL | 0 refills | Status: DC

## 2016-06-20 NOTE — Assessment & Plan Note (Signed)
Pt with left-sided sciatica x 2 years. Recent x-ray of lumbar spine in 11/2015 showed mild lower lumbar degenerative disc disease and facet arthropathy. MRI of lumbar spine in 07/2015 with degenerative changes of the lumbar spine and neural foraminal narrowing at all lumbar levels. No red flags on exam today. - Change Gabapentin to Lyrica 50mg  bid, as Pt states Gabapentin has not helped at all - Start Cymbalta 30mg  daily x 1 month, then 60mg  daily - Stop Norco, would not use in this patient with a history of substance abuse - May need to consider referral to Neurosurgery vs pain clinic in the future - Follow-up in 3 months

## 2016-06-20 NOTE — Patient Instructions (Addendum)
It was so nice to meet you!  I have started a new medication called Cymbalta. Please take 1 tablet (30mg ) once a day for 1 month. Then take 2 tablets (60mg ) once a day.  I have also switched the Gabapentin to Lyrica. Please take Lyrica 50mg  twice a day.  We will see you back in 3 months.  -Dr. Brett Albino

## 2016-06-20 NOTE — Assessment & Plan Note (Signed)
Flu shot given today

## 2016-06-20 NOTE — Progress Notes (Signed)
[facility] Phone: [phone]  Subjective:  L sided sciatica: Has been going on for 2 years. Worse with walking a lot, but can happen with rest. Pain starts on the left side of his lower back and then travels down the back of the leg to the back of the knee. Feels like a "hurt". He is currently taking Gabapentin 300mg  tid, and he doesn't feel like it's helping. Has also tried Norco, which was prescribed by the ED, but this hasn't helped at all. No fevers, no chills.  ROS: See HPI for pertinent positives and negatives Past Medical History- HFrEF, A-fib, COPD Reviewed problem list.  Medications- reviewed and updated Current Outpatient Prescriptions  Medication Sig Dispense Refill  . acetaminophen (TYLENOL) 325 MG tablet Take 2 tablets (650 mg total) by mouth every 6 (six) hours as needed. 30 tablet 0  . amiodarone (PACERONE) 200 MG tablet Take 1 tablet (200 mg total) by mouth daily. 90 tablet 3  . atorvastatin (LIPITOR) 20 MG tablet Take 1 tablet (20 mg total) by mouth daily. 90 tablet 3  . carvedilol (COREG) 3.125 MG tablet TAKE 1 TABLET (3.125 MG TOTAL) BY MOUTH TWO TIMES DAILY WITH A MEAL. 60 tablet 3  . DULoxetine (CYMBALTA) 30 MG capsule Take 1 capsule (30 mg total) by mouth daily. 90 capsule 0  . furosemide (LASIX) 40 MG tablet TAKE 1 TABLET BY MOUTH   DAILY (Patient taking differently: TAKE 40 MG BY MOUTH   DAILY) 180 tablet 3  . HYDROcodone-acetaminophen (NORCO/VICODIN) 5-325 MG tablet Take 1-2 tablets by mouth every 6 (six) hours as needed for moderate pain. 15 tablet 0  . lidocaine (LIDODERM) 5 % Place 1 patch onto the skin daily. Remove & Discard patch within 12 hours or as directed by MD 30 patch 0  . losartan (COZAAR) 25 MG tablet Take 1 tablet (25 mg total) by mouth daily. 90 tablet 2  . methylPREDNISolone (MEDROL DOSEPAK) 4 MG TBPK tablet Take as directed on package. 21 tablet 0  . mometasone-formoterol (DULERA) 200-5 MCG/ACT AERO Inhale 2 puffs into  the lungs 2 (two) times daily as needed for wheezing. 13 g 2  . pantoprazole (PROTONIX) 40 MG tablet Take 1 tablet (40 mg total) by mouth daily. 30 tablet 11  . predniSONE (DELTASONE) 20 MG tablet 3 tabs po daily x 3 days, then 2 tabs x 3 days, then 1.5 tabs x 3 days, then 1 tab x 3 days, then 0.5 tabs x 3 days (Patient taking differently: Take 20 mg by mouth daily with breakfast. ) 27 tablet 0  . pregabalin (LYRICA) 50 MG capsule Take 1 capsule (50 mg total) by mouth 2 (two) times daily. 120 capsule 0  . rivaroxaban (XARELTO) 20 MG TABS tablet Take 1 tablet (20 mg total) by mouth daily. 30 tablet 1  . spironolactone (ALDACTONE) 25 MG tablet Take 1 tablet (25 mg total) by mouth daily. 90 tablet 3  . Umeclidinium Bromide (INCRUSE ELLIPTA) 62.5 MCG/INH AEPB Inhale 1 Inhaler into the lungs daily. 30 each 3   No current facility-administered medications for this visit.    Chief complaint-noted Family history reviewed for today's visit. No changes. Social history- patient is a current smoker  Objective: BP (!) 97/58 (BP Location: Left Arm, Patient Position: Sitting, Cuff Size: Normal)   Pulse 97   Temp 98.4 F (36.9 C) (Oral)   Ht 6' (1.829 m)   Wt 204 lb 9.6 oz (92.8 kg)   BMI 27.75 kg/m  Gen: NAD, alert, cooperative with exam Neck: FROM Back: No tenderness to palpation of spinous processes, no step offs, mild tenderness to palpation of left SI joint and left paraspinal muscles of the lower back. Msk: No edema, warm, normal tone, moves UE/LE spontaneously Neuro: Alert and oriented, straight leg raise negative bilaterally Skin: No rashes, no lesions  Assessment/Plan: Lower back pain with left sided sciatica: Pt with left-sided sciatica x 2 years. Recent x-ray of lumbar spine in 11/2015 showed mild lower lumbar degenerative disc disease and facet arthropathy. MRI of lumbar spine in 07/2015 with degenerative changes of the lumbar spine and neural foraminal narrowing at all lumbar levels. No  red flags on exam today. - Change Gabapentin to Lyrica 50mg  bid, as Pt states Gabapentin has not helped at all - Start Cymbalta 30mg  daily x 1 month, then 60mg  daily - Stop Norco, would not use in this patient with a history of substance abuse - May need to consider referral to Neurosurgery vs pain clinic in the future - Follow-up in 3 months  Health Care Maintenance: - Flu shot given today   Hyman Bible, MD PGY-2

## 2016-06-24 ENCOUNTER — Encounter (HOSPITAL_COMMUNITY): Payer: Self-pay | Admitting: Emergency Medicine

## 2016-06-24 ENCOUNTER — Emergency Department (HOSPITAL_COMMUNITY)
Admission: EM | Admit: 2016-06-24 | Discharge: 2016-06-24 | Disposition: A | Discharge status: Home / Self Care | Payer: Medicare Other | Attending: Emergency Medicine | Admitting: Emergency Medicine

## 2016-06-24 ENCOUNTER — Other Ambulatory Visit: Payer: Self-pay

## 2016-06-24 DIAGNOSIS — J449 Chronic obstructive pulmonary disease, unspecified: Secondary | ICD-10-CM | POA: Diagnosis not present

## 2016-06-24 DIAGNOSIS — F17210 Nicotine dependence, cigarettes, uncomplicated: Secondary | ICD-10-CM | POA: Insufficient documentation

## 2016-06-24 DIAGNOSIS — I2510 Atherosclerotic heart disease of native coronary artery without angina pectoris: Secondary | ICD-10-CM | POA: Diagnosis not present

## 2016-06-24 DIAGNOSIS — Z85038 Personal history of other malignant neoplasm of large intestine: Secondary | ICD-10-CM | POA: Insufficient documentation

## 2016-06-24 DIAGNOSIS — Z79899 Other long term (current) drug therapy: Secondary | ICD-10-CM | POA: Diagnosis not present

## 2016-06-24 DIAGNOSIS — T43215A Adverse effect of selective serotonin and norepinephrine reuptake inhibitors, initial encounter: Secondary | ICD-10-CM | POA: Insufficient documentation

## 2016-06-24 DIAGNOSIS — I110 Hypertensive heart disease with heart failure: Secondary | ICD-10-CM | POA: Diagnosis not present

## 2016-06-24 DIAGNOSIS — I5022 Chronic systolic (congestive) heart failure: Secondary | ICD-10-CM | POA: Insufficient documentation

## 2016-06-24 DIAGNOSIS — Z7901 Long term (current) use of anticoagulants: Secondary | ICD-10-CM | POA: Diagnosis not present

## 2016-06-24 DIAGNOSIS — Z5181 Encounter for therapeutic drug level monitoring: Secondary | ICD-10-CM | POA: Diagnosis not present

## 2016-06-24 DIAGNOSIS — R112 Nausea with vomiting, unspecified: Secondary | ICD-10-CM | POA: Diagnosis not present

## 2016-06-24 NOTE — ED Triage Notes (Signed)
States  Thinks he is having a reaction to  cymbalta that was rx for him, states took one pill and he had stomach ach vomitied and he feels terrible

## 2016-06-24 NOTE — ED Provider Notes (Signed)
[facility] DEPT Provider Note   CSN: [id number] Arrival date & time: 06/24/16  1348     History   Chief Complaint Chief Complaint  Patient presents with  . Allergic Reaction    HPI Paul Clayton is a 70 y.o. male.  Presents here for evaluation of nausea, and vomiting. This occurred earlier today after he took his first dose of Cymbalta. It was prescribed for left sciatica, they have been present for months. After the vomiting, he noticed a transient period of "blindness" that lasted a few seconds. He also had some burning in his anterior chest which has since resolved. He is not hungry and thirsty. No prior similar problems. No recent fever, chills, cough, weakness or dizziness. There are no other known modifying factors.  HPI  Past Medical History:  Diagnosis Date  . Adenocarcinoma of colon ([place])    STAGE 1  . Anxiety   . Arthritis    "left hip" (04/19/2015)  . CAD (coronary artery disease)    a. LHC 1/16:  LM 20%, oLAD 30%, oLCx 30%, pRCA 30%  . Chronic lower back pain   . Chronic systolic CHF (congestive heart failure) (HCC)    a. EF previously 15%; b. Echo 7/16:  EF 30-35%, diff HK, gr 1 DD, mild MR, mild LAE, mild reduced RVSF  . COPD (chronic obstructive pulmonary disease) ([place])   . Depression   . GERD (gastroesophageal reflux disease)   . Hepatitis C   . Hypertension   . Migraine    "last one was back in the 1980's" (04/19/2015)  . NICM (nonischemic cardiomyopathy) ([place])    no obs CAD on LHC in 1/16 - ? HTN or substance abuse  . Persistent atrial fibrillation (HCC)    a. s/p DCCV; b. Amiodarone  . Substance abuse    ETOH; crack cocaine  . Tobacco abuse     Patient Active Problem List   Diagnosis Date Noted  . Health care maintenance 06/20/2016  . Urinary incontinence 05/03/2015  . LBP radiating to left leg 03/12/2015  . Nonischemic cardiomyopathy ([place]) 03/05/2015  . Hx of substance abuse 10/30/2014  . Faintness   . Homeless single person   . Atrial  fibrillation with RVR ([place]) 10/13/2014  . High risk social situation 09/30/2014  . Colon cancer ([place]) 09/26/2014  . HFrEF (heart failure with reduced ejection fraction) ([place]) 09/12/2014  . A-fib ([place]) 09/12/2014  . COPD (chronic obstructive pulmonary disease) ([place]) 09/12/2014    Past Surgical History:  Procedure Laterality Date  . CARDIAC CATHETERIZATION  10/23/2014  . CARDIOVERSION     "I've had 2 in all" (04/19/2015)  . CARDIOVERSION N/A 01/30/2015   Procedure: CARDIOVERSION;  Surgeon: Larey Dresser, MD;  Location: [facility];  Service: Cardiovascular;  Laterality: N/A;  . COLONOSCOPY    . COLONOSCOPY WITH PROPOFOL N/A 11/30/2014   Procedure: COLONOSCOPY WITH PROPOFOL;  Surgeon: Milus Banister, MD;  Location: [facility];  Service: Endoscopy;  Laterality: N/A;  . LEFT AND RIGHT HEART CATHETERIZATION WITH CORONARY ANGIOGRAM N/A 10/23/2014   Procedure: LEFT AND RIGHT HEART CATHETERIZATION WITH CORONARY ANGIOGRAM;  Surgeon: Larey Dresser, MD;  Location: [facility] CATH LAB;  Service: Cardiovascular;  Laterality: N/A;       Home Medications    Prior to Admission medications   Medication Sig Start Date End Date Taking? Authorizing Provider  acetaminophen (TYLENOL) 325 MG tablet Take 2 tablets (650 mg total) by mouth every 6 (six) hours as needed. 12/20/15  Yes Elmyra Ricks  Myrtice Lauth, PA-C  amiodarone (PACERONE) 200 MG tablet Take 1 tablet (200 mg total) by mouth daily. 08/20/15  Yes Larey Dresser, MD  atorvastatin (LIPITOR) 20 MG tablet Take 1 tablet (20 mg total) by mouth daily. 08/20/15  Yes Larey Dresser, MD  losartan (COZAAR) 25 MG tablet Take 1 tablet (25 mg total) by mouth daily. 08/23/15  Yes Larey Dresser, MD  mometasone-formoterol [facility]) 200-5 MCG/ACT AERO Inhale 2 puffs into the lungs 2 (two) times daily as needed for wheezing. 01/06/15  Yes Leone Brand, MD  pantoprazole (PROTONIX) 40 MG tablet Take 1 tablet (40 mg total) by mouth daily. 08/20/15  Yes Larey Dresser, MD    pregabalin (LYRICA) 50 MG capsule Take 1 capsule (50 mg total) by mouth 2 (two) times daily. 06/20/16  Yes Sela Hua, MD  rivaroxaban (XARELTO) 20 MG TABS tablet Take 1 tablet (20 mg total) by mouth daily. 05/20/16  Yes Larey Dresser, MD  spironolactone (ALDACTONE) 25 MG tablet Take 1 tablet (25 mg total) by mouth daily. 08/20/15  Yes Larey Dresser, MD  carvedilol (COREG) 6.25 MG tablet Take 1 tablet (6.25 mg total) by mouth 2 (two) times daily with a meal. 06/25/16   Larey Dresser, MD  HYDROcodone-acetaminophen (NORCO/VICODIN) 5-325 MG tablet Take 1-2 tablets by mouth every 6 (six) hours as needed for moderate pain. Patient not taking: Reported on 06/24/2016 05/27/16   Lajean Saver, MD  lidocaine (LIDODERM) 5 % Place 1 patch onto the skin daily. Remove & Discard patch within 12 hours or as directed by MD Patient not taking: Reported on 06/24/2016 06/20/16   Sela Hua, MD  Umeclidinium Bromide (INCRUSE ELLIPTA) 62.5 MCG/INH AEPB Inhale 1 Inhaler into the lungs daily. Patient not taking: Reported on 06/24/2016 10/06/15   Olam Idler, MD    Family History Family History  Problem Relation Age of Onset  . Hypertension Mother     Social History Social History  Substance Use Topics  . Smoking status: Current Every Day Smoker    Packs/day: 0.10    Years: 52.00    Types: Cigarettes    Last attempt to quit: 09/01/2014  . Smokeless tobacco: Never Used     Comment: 06/20/16-started back smoking; pack lasts 1 mo; wants to quit again  . Alcohol use 0.0 oz/week     Comment: 04/19/2015 "I might drink a 40oz beer/month"     Allergies   Review of patient's allergies indicates no known allergies.   Review of Systems Review of Systems  All other systems reviewed and are negative.    Physical Exam Updated Vital Signs BP 118/82   Pulse 81   Temp 98.4 F (36.9 C) (Oral)   Resp 15   Ht 6' (1.829 m)   Wt 204 lb (92.5 kg)   SpO2 100%   BMI 27.67 kg/m   Physical Exam  Constitutional:  He is oriented to person, place, and time. He appears well-developed and well-nourished.  HENT:  Head: Normocephalic and atraumatic.  Right Ear: External ear normal.  Left Ear: External ear normal.  Eyes: Conjunctivae and EOM are normal. Pupils are equal, round, and reactive to light.  Neck: Normal range of motion and phonation normal. Neck supple.  Cardiovascular: Normal rate, regular rhythm and normal heart sounds.   Pulmonary/Chest: Effort normal and breath sounds normal. He exhibits no bony tenderness.  Abdominal: Soft. There is no tenderness.  Musculoskeletal: Normal range of motion.  Neurological: He is  alert and oriented to person, place, and time. No cranial nerve deficit or sensory deficit. He exhibits normal muscle tone. Coordination normal.  Skin: Skin is warm, dry and intact.  Psychiatric: He has a normal mood and affect. His behavior is normal. Judgment and thought content normal.  Nursing note and vitals reviewed.    ED Treatments / Results  Labs (all labs ordered are listed, but only abnormal results are displayed) Labs Reviewed - No data to display  EKG  EKG Interpretation  Date/Time:  Tuesday June 24 2016 18:47:11 EDT Ventricular Rate:  82 PR Interval:    QRS Duration: 93 QT Interval:  413 QTC Calculation: 483 R Axis:   31 Text Interpretation:  Sinus rhythm Probable left atrial enlargement LVH with secondary repolarization abnormality Borderline prolonged QT interval since last tracing no significant change Confirmed by Eulis Foster  MD, Harlis Champoux [id number]) on 06/24/2016 7:56:15 PM       Radiology No results found.  Procedures Procedures (including critical care time)  Medications Ordered in ED Medications - No data to display   Initial Impression / Assessment and Plan / ED Course  I have reviewed the triage vital signs and the nursing notes.  Pertinent labs & imaging results that were available during my care of the patient were reviewed by me and  considered in my medical decision making (see chart for details).  Clinical Course  Value Comment By Time  ED EKG (Reviewed) Daleen Bo, MD 09/05 1937    Medications - No data to display  Patient Vitals for the past 24 hrs:  BP Temp Temp src Pulse Resp SpO2  06/24/16 2015 118/82 - - 81 15 100 %  06/24/16 1945 113/73 - - 77 17 98 %  06/24/16 1845 108/76 - - 84 24 96 %  06/24/16 1830 103/67 - - 88 (!) 28 99 %  06/24/16 1654 115/71 98.4 F (36.9 C) Oral 90 16 99 %    At D/C Reevaluation with update and discussion. After initial assessment and treatment, an updated evaluation reveals no change in clinical status. Findings discussed with patient and all questions answered. Yania Bogie L '  Final Clinical Impressions(s) / ED Diagnoses   Final diagnoses:  Nausea and vomiting, vomiting of unspecified type   Nonspecific reaction to new medication, Cymbalta. Doubt IgG mediated, anaphylaxis, or progressive component.  Nursing Notes Reviewed/ Care Coordinated Applicable Imaging Reviewed Interpretation of Laboratory Data incorporated into ED treatment  The patient appears reasonably screened and/or stabilized for discharge and I doubt any other medical condition or other [facility] requiring further screening, evaluation, or treatment in the ED at this time prior to discharge.  Plan: Home Medications- stop Cymbalta; Home Treatments- rest; return here if the recommended treatment, does not improve the symptoms; Recommended follow up- PCP prn   New Prescriptions Discharge Medication List as of 06/24/2016  8:12 PM       Daleen Bo, MD 06/25/16 1426

## 2016-06-24 NOTE — ED Notes (Signed)
No response for reassessment vitals.

## 2016-06-24 NOTE — Discharge Instructions (Signed)
Stop taking the Duloxetine.  See your primary care doctor for checkup in one or 2 weeks.  Return here, if needed, for problems.

## 2016-06-25 ENCOUNTER — Ambulatory Visit (HOSPITAL_COMMUNITY)
Admission: RE | Admit: 2016-06-25 | Discharge: 2016-06-25 | Disposition: A | Discharge status: Home / Self Care | Payer: Medicare Other | Source: Ambulatory Visit | Attending: Cardiology | Admitting: Cardiology

## 2016-06-25 ENCOUNTER — Encounter (HOSPITAL_COMMUNITY): Payer: Self-pay

## 2016-06-25 VITALS — BP 106/64 | HR 75 | Wt 203.8 lb

## 2016-06-25 DIAGNOSIS — I5022 Chronic systolic (congestive) heart failure: Secondary | ICD-10-CM | POA: Diagnosis not present

## 2016-06-25 DIAGNOSIS — R29898 Other symptoms and signs involving the musculoskeletal system: Secondary | ICD-10-CM | POA: Diagnosis not present

## 2016-06-25 DIAGNOSIS — B1920 Unspecified viral hepatitis C without hepatic coma: Secondary | ICD-10-CM | POA: Diagnosis not present

## 2016-06-25 DIAGNOSIS — J449 Chronic obstructive pulmonary disease, unspecified: Secondary | ICD-10-CM | POA: Diagnosis not present

## 2016-06-25 DIAGNOSIS — F1410 Cocaine abuse, uncomplicated: Secondary | ICD-10-CM | POA: Insufficient documentation

## 2016-06-25 DIAGNOSIS — Z87898 Personal history of other specified conditions: Secondary | ICD-10-CM

## 2016-06-25 DIAGNOSIS — Z7901 Long term (current) use of anticoagulants: Secondary | ICD-10-CM | POA: Diagnosis not present

## 2016-06-25 DIAGNOSIS — C189 Malignant neoplasm of colon, unspecified: Secondary | ICD-10-CM | POA: Diagnosis not present

## 2016-06-25 DIAGNOSIS — I2510 Atherosclerotic heart disease of native coronary artery without angina pectoris: Secondary | ICD-10-CM | POA: Insufficient documentation

## 2016-06-25 DIAGNOSIS — Z5181 Encounter for therapeutic drug level monitoring: Secondary | ICD-10-CM | POA: Diagnosis not present

## 2016-06-25 DIAGNOSIS — I429 Cardiomyopathy, unspecified: Secondary | ICD-10-CM | POA: Diagnosis not present

## 2016-06-25 DIAGNOSIS — I4891 Unspecified atrial fibrillation: Secondary | ICD-10-CM | POA: Diagnosis not present

## 2016-06-25 DIAGNOSIS — I480 Paroxysmal atrial fibrillation: Secondary | ICD-10-CM

## 2016-06-25 DIAGNOSIS — F1021 Alcohol dependence, in remission: Secondary | ICD-10-CM | POA: Insufficient documentation

## 2016-06-25 DIAGNOSIS — K219 Gastro-esophageal reflux disease without esophagitis: Secondary | ICD-10-CM | POA: Insufficient documentation

## 2016-06-25 DIAGNOSIS — F1911 Other psychoactive substance abuse, in remission: Secondary | ICD-10-CM

## 2016-06-25 DIAGNOSIS — Z85038 Personal history of other malignant neoplasm of large intestine: Secondary | ICD-10-CM | POA: Insufficient documentation

## 2016-06-25 DIAGNOSIS — E785 Hyperlipidemia, unspecified: Secondary | ICD-10-CM | POA: Diagnosis not present

## 2016-06-25 DIAGNOSIS — F17210 Nicotine dependence, cigarettes, uncomplicated: Secondary | ICD-10-CM | POA: Insufficient documentation

## 2016-06-25 LAB — COMPREHENSIVE METABOLIC PANEL
ALT: 35 U/L (ref 17–63)
ANION GAP: 7 (ref 5–15)
AST: 35 U/L (ref 15–41)
Albumin: 3.6 g/dL (ref 3.5–5.0)
Alkaline Phosphatase: 49 U/L (ref 38–126)
BUN: 12 mg/dL (ref 6–20)
CHLORIDE: 107 mmol/L (ref 101–111)
CO2: 25 mmol/L (ref 22–32)
Calcium: 9.4 mg/dL (ref 8.9–10.3)
Creatinine, Ser: 0.89 mg/dL (ref 0.61–1.24)
Glucose, Bld: 144 mg/dL — ABNORMAL HIGH (ref 65–99)
POTASSIUM: 4.3 mmol/L (ref 3.5–5.1)
Sodium: 139 mmol/L (ref 135–145)
Total Bilirubin: 0.6 mg/dL (ref 0.3–1.2)
Total Protein: 6.8 g/dL (ref 6.5–8.1)

## 2016-06-25 LAB — CBC
HEMATOCRIT: 41.3 % (ref 39.0–52.0)
HEMOGLOBIN: 13.5 g/dL (ref 13.0–17.0)
MCH: 32.6 pg (ref 26.0–34.0)
MCHC: 32.7 g/dL (ref 30.0–36.0)
MCV: 99.8 fL (ref 78.0–100.0)
Platelets: 167 10*3/uL (ref 150–400)
RBC: 4.14 MIL/uL — AB (ref 4.22–5.81)
RDW: 12.5 % (ref 11.5–15.5)
WBC: 6.5 10*3/uL (ref 4.0–10.5)

## 2016-06-25 LAB — BRAIN NATRIURETIC PEPTIDE: B Natriuretic Peptide: 65.3 pg/mL (ref 0.0–100.0)

## 2016-06-25 MED ORDER — CARVEDILOL 6.25 MG PO TABS: 6.2500 mg | ORAL_TABLET | Freq: Two times a day (BID) | ORAL | 3 refills | Status: DC

## 2016-06-25 NOTE — Patient Instructions (Signed)
Increase Carvedilol to 6.25 mg Twice daily   Labs today  Your physician has requested that you have an echocardiogram. Echocardiography is a painless test that uses sound waves to create images of your heart. It provides your doctor with information about the size and shape of your heart and how well your heart's chambers and valves are working. This procedure takes approximately one hour. There are no restrictions for this procedure.  Your physician has requested that you have a lower or upper extremity arterial duplex. This test is an ultrasound of the arteries in the legs or arms. It looks at arterial blood flow in the legs and arms. Allow one hour for Lower and Upper Arterial scans. There are no restrictions or special instructions  Your physician recommends that you schedule a follow-up appointment in: 2 months

## 2016-06-25 NOTE — Progress Notes (Addendum)
Patient ID: Paul Clayton, male   DOB: Jun 23, 1946, 70 y.o.   MRN: [id number] PCP: Dr. Brett Albino Cardiology: Dr. Aundra Dubin  70 yo with history of COPD, ETOH and cocaine abuse, HCV, colon cancer, paroxysmal atrial fibrillation, and chronic systolic CHF presents for cardiology followup.  He moved to [place] from [place].  He says that he has had a cardiomyopathy known since 2012. This has been presumed to be due to cocaine and ETOH abuse. He says that he quit ETOH, cocaine, and smoking in 10/15.  He was admitted to [facility] in 12/15 with atrial fibrillation and RVR. He had been out of his Coreg x 2 weeks.  He was diuresed and rate-controlled.  He had had an episode of syncope prior to admission that was thought to be orthostatic given low BP in the setting of atrial fibrillation with RVR.  Echo in 11/15 showed EF 15% with severe MR and TR and moderately decreased RV systolic function.    He had a colonoscopy in 11/15 with a mass found, biopsy showed adenocarcinoma.  Most recent GI evaluation suggests that he will not need colectomy.   LHC/RHC in 1/16 showed no obstructive CAD and mildly elevated filling pressures.  Lasix was increased to 40 mg daily. He was admitted earlier in 1/16 with syncope, possibly defecation syncope.  He wore a Lifevest for a period of time but no longer (ICD not placed given episodes of cocaine use ongoing).  In 4/16, he underwent DCCV to NSR.  He was bradycardic, so Coreg was stopped.    He was admitted in 7/16 with cocaine-related chest pain.    He returns for followup today.  He has not been seen for around a year.  Says he moved back to [place] for a while.  Has been taking all his meds except for Lasix (has not taken for a month).  Generally seems to be doing ok.  He is in NSR today.  No chest pain.  No palpitations.  No orthopnea/PND.  No dyspnea walking on flat ground.  He has ongoing sciatica-type pain down his left leg, says leg "gives out."  This limits him more than  dyspnea.  Still occasionally uses cocaine and still smokes.   Labs (12/15): K 4.3, creatinine 0.82, AST normal, ALT 60, HCT 40.9, digoxin 0.7, BNP 4401, HIV negative Labs (10/28/2014): K 4.5 Creatinine 0.90, digoxin 0.9, LFTs normal  Labs (1/16): K 4.3, creatinine 1.11, HCT 41.5, BNP 255, digoxin 0.6 Labs (12/06/2014): K 4.3 Creatinine 1.18  Labs (4/16): AST 53, ALT 60 Labs (5/16): K 4.3, creatinine 1.17, digoxin 0.7, HCT 36.6, BNP 97.5 Labs (7/16): AST 46, ALT 49, TSH normal Labs (9/16): K 4.7, creatinine 0.93, HCT 39.7 Labs (2/17): K 5, creatinine 0.85, HCT 58.8  ECG: NSR, LVH with repolarization abnormality.   PMH: 1. COPD: Quit smoking in 10/15 but now back to smoking a few cigarettes/day.  2. Cocaine abuse: Still uses occasionally.   3. ETOH abuse: Quit ETOH in 10/15.  4. Atrial fibrillation: Paroxysmal.  Reports DCCV x 2 in [place].  DCCV 4/16 => bradycardic => cut back Coreg.  5. HCV 6. Colon cancer: Colonoscopy in [place] with removal of polyp showing colonic adenocarcinoma on pathology.  PET scan in 12/15: probably no metastatic disease.   7. H/o syncope: Thought to be orthostatic. Also had vagal-type syncope associated with defecation.  8. Cardiomyopathy: Known since 2012.  Echo (11/15) with EF 15%, diffuse hypokinesis, severe LV dilation, severe MR (likely functional), moderate to severe  LAE, dilated RV with moderately decreased systolic function, severe TR.  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  HIV negative.  LHC/RHC (1/16) with nonobstructive CAD, EF 20-25%, mean RA 9, PA 44/26, mean PCWP 21, CI 2.1.  Echo (7/16) with EF 30-35%, mildly dilated LV, mildly decreased RV systolic function.   9. GERD 10. Sciatica 11. H/o left femur fracture  SH: Prior ETOH abuse. Still uses cocaine but rare now.  Still smokes a few cigarettes/day.  Moved here from [place].  Daughter lives in [place].   FH: No premature CAD.   ROS: All systems reviewed and negative except as per HPI.    Current Outpatient Prescriptions  Medication Sig Dispense Refill  . acetaminophen (TYLENOL) 325 MG tablet Take 2 tablets (650 mg total) by mouth every 6 (six) hours as needed. 30 tablet 0  . amiodarone (PACERONE) 200 MG tablet Take 1 tablet (200 mg total) by mouth daily. 90 tablet 3  . atorvastatin (LIPITOR) 20 MG tablet Take 1 tablet (20 mg total) by mouth daily. 90 tablet 3  . carvedilol (COREG) 6.25 MG tablet Take 1 tablet (6.25 mg total) by mouth 2 (two) times daily with a meal. 60 tablet 3  . losartan (COZAAR) 25 MG tablet Take 1 tablet (25 mg total) by mouth daily. 90 tablet 2  . mometasone-formoterol (DULERA) 200-5 MCG/ACT AERO Inhale 2 puffs into the lungs 2 (two) times daily as needed for wheezing. 13 g 2  . pantoprazole (PROTONIX) 40 MG tablet Take 1 tablet (40 mg total) by mouth daily. 30 tablet 11  . pregabalin (LYRICA) 50 MG capsule Take 1 capsule (50 mg total) by mouth 2 (two) times daily. 120 capsule 0  . rivaroxaban (XARELTO) 20 MG TABS tablet Take 1 tablet (20 mg total) by mouth daily. 30 tablet 1  . spironolactone (ALDACTONE) 25 MG tablet Take 1 tablet (25 mg total) by mouth daily. 90 tablet 3  . HYDROcodone-acetaminophen (NORCO/VICODIN) 5-325 MG tablet Take 1-2 tablets by mouth every 6 (six) hours as needed for moderate pain. (Patient not taking: Reported on 06/24/2016) 15 tablet 0  . lidocaine (LIDODERM) 5 % Place 1 patch onto the skin daily. Remove & Discard patch within 12 hours or as directed by MD (Patient not taking: Reported on 06/24/2016) 30 patch 0  . Umeclidinium Bromide (INCRUSE ELLIPTA) 62.5 MCG/INH AEPB Inhale 1 Inhaler into the lungs daily. (Patient not taking: Reported on 06/24/2016) 30 each 3   No current facility-administered medications for this encounter.    BP 106/64   Pulse 75   Wt 203 lb 12 oz (92.4 kg)   SpO2 99%   BMI 27.63 kg/m  General: NAD Neck: No JVD, no thyromegaly or thyroid nodule.  Lungs: Clear to auscultation bilaterally with normal  respiratory effort. CV: Nondisplaced PMI.  Heart irregular S1/S2, no S3/S4, 2/6 HSM LLSB and apex.  No peripheral edema.  No carotid bruit.  Difficult to palpate pedal pulses.  Abdomen: Soft, nontender, no hepatosplenomegaly, no distention.  Skin: Intact without lesions or rashes.  Neurologic: Alert and oriented x 3.  Psych: Normal affect. Extremities: No clubbing or cyanosis.  HEENT: Normal.   Assessment/Plan: 1. Chronic systolic CHF: Cardiomyopathy with LV EF 30-35% and mild RV dysfunction on 7/16 echo. 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  HIV negative.  NYHA class II currently, not volume overloaded.  He has not taken Lasix for a month.  - I think that he can stay off Lasix for now.   - Increase  Coreg to 6.25 mg bid.  - Continue losartan 25 daily and spironolactone 25 daily. BMET/BNP today.   - Still occasionally using cocaine => I told him that he needs to quit altogether.  Would not consider ICD until substance abuse stops.  - Repeat echo.  2. Atrial fibrillation: s/p DCCV, now in NSR.  - He is on amiodarone 200 daily.  Needs screening labs => LFTs, TSH.  Needs regular eye exam.   - Continue Xarelto, check CBC.  3. HCV: Follow LFTs closely with amiodarone.  4. COPD: I counseled him to quit smoking.   5. Colon cancer: Per primary care.  6. Syncope: No recurrence.  7. Hyperlipidemia: Continue atorvastatin.   Followup in 2 months.     Loralie Champagne 06/25/2016

## 2016-06-30 DIAGNOSIS — Z5181 Encounter for therapeutic drug level monitoring: Secondary | ICD-10-CM | POA: Diagnosis not present

## 2016-07-02 ENCOUNTER — Other Ambulatory Visit (HOSPITAL_COMMUNITY): Payer: Self-pay | Admitting: Cardiology

## 2016-07-02 DIAGNOSIS — I739 Peripheral vascular disease, unspecified: Secondary | ICD-10-CM

## 2016-07-02 DIAGNOSIS — Z5181 Encounter for therapeutic drug level monitoring: Secondary | ICD-10-CM | POA: Diagnosis not present

## 2016-07-08 ENCOUNTER — Other Ambulatory Visit: Payer: Self-pay

## 2016-07-08 ENCOUNTER — Ambulatory Visit (HOSPITAL_COMMUNITY): Admission: RE | Admit: 2016-07-08 | Payer: Medicare Other | Source: Ambulatory Visit

## 2016-07-08 ENCOUNTER — Ambulatory Visit (HOSPITAL_COMMUNITY): Payer: Medicare Other | Attending: Cardiology

## 2016-07-08 DIAGNOSIS — I5022 Chronic systolic (congestive) heart failure: Secondary | ICD-10-CM | POA: Diagnosis not present

## 2016-07-09 ENCOUNTER — Encounter (HOSPITAL_COMMUNITY): Payer: Self-pay | Admitting: Cardiology

## 2016-07-09 DIAGNOSIS — Z5181 Encounter for therapeutic drug level monitoring: Secondary | ICD-10-CM | POA: Diagnosis not present

## 2016-07-14 DIAGNOSIS — Z5181 Encounter for therapeutic drug level monitoring: Secondary | ICD-10-CM | POA: Diagnosis not present

## 2016-07-15 ENCOUNTER — Encounter (HOSPITAL_COMMUNITY): Payer: Self-pay | Admitting: Cardiology

## 2016-07-16 ENCOUNTER — Encounter (HOSPITAL_COMMUNITY): Payer: Self-pay | Admitting: *Deleted

## 2016-07-16 DIAGNOSIS — Z5181 Encounter for therapeutic drug level monitoring: Secondary | ICD-10-CM | POA: Diagnosis not present

## 2016-07-21 DIAGNOSIS — Z5181 Encounter for therapeutic drug level monitoring: Secondary | ICD-10-CM | POA: Diagnosis not present

## 2016-07-23 DIAGNOSIS — Z5181 Encounter for therapeutic drug level monitoring: Secondary | ICD-10-CM | POA: Diagnosis not present

## 2016-07-25 ENCOUNTER — Other Ambulatory Visit: Payer: Self-pay | Admitting: Cardiology

## 2016-07-28 DIAGNOSIS — Z5181 Encounter for therapeutic drug level monitoring: Secondary | ICD-10-CM | POA: Diagnosis not present

## 2016-07-30 DIAGNOSIS — Z5181 Encounter for therapeutic drug level monitoring: Secondary | ICD-10-CM | POA: Diagnosis not present

## 2016-08-04 DIAGNOSIS — Z5181 Encounter for therapeutic drug level monitoring: Secondary | ICD-10-CM | POA: Diagnosis not present

## 2016-08-06 DIAGNOSIS — Z5181 Encounter for therapeutic drug level monitoring: Secondary | ICD-10-CM | POA: Diagnosis not present

## 2016-08-25 ENCOUNTER — Emergency Department (HOSPITAL_COMMUNITY)
Admission: EM | Admit: 2016-08-25 | Discharge: 2016-08-25 | Disposition: A | Discharge status: Home / Self Care | Payer: Medicare Other | Attending: Physician Assistant | Admitting: Physician Assistant

## 2016-08-25 ENCOUNTER — Emergency Department (HOSPITAL_COMMUNITY): Payer: Medicare Other

## 2016-08-25 ENCOUNTER — Encounter (HOSPITAL_COMMUNITY): Payer: Self-pay | Admitting: Emergency Medicine

## 2016-08-25 ENCOUNTER — Other Ambulatory Visit (HOSPITAL_COMMUNITY): Payer: Self-pay | Admitting: Cardiology

## 2016-08-25 DIAGNOSIS — R0602 Shortness of breath: Secondary | ICD-10-CM

## 2016-08-25 DIAGNOSIS — J189 Pneumonia, unspecified organism: Secondary | ICD-10-CM | POA: Diagnosis not present

## 2016-08-25 DIAGNOSIS — J181 Lobar pneumonia, unspecified organism: Secondary | ICD-10-CM | POA: Diagnosis not present

## 2016-08-25 DIAGNOSIS — I110 Hypertensive heart disease with heart failure: Secondary | ICD-10-CM | POA: Insufficient documentation

## 2016-08-25 DIAGNOSIS — I5022 Chronic systolic (congestive) heart failure: Secondary | ICD-10-CM | POA: Insufficient documentation

## 2016-08-25 DIAGNOSIS — F17210 Nicotine dependence, cigarettes, uncomplicated: Secondary | ICD-10-CM | POA: Insufficient documentation

## 2016-08-25 DIAGNOSIS — Z720 Tobacco use: Secondary | ICD-10-CM

## 2016-08-25 DIAGNOSIS — J441 Chronic obstructive pulmonary disease with (acute) exacerbation: Secondary | ICD-10-CM | POA: Insufficient documentation

## 2016-08-25 DIAGNOSIS — I2510 Atherosclerotic heart disease of native coronary artery without angina pectoris: Secondary | ICD-10-CM | POA: Diagnosis not present

## 2016-08-25 DIAGNOSIS — R05 Cough: Secondary | ICD-10-CM | POA: Diagnosis not present

## 2016-08-25 DIAGNOSIS — J449 Chronic obstructive pulmonary disease, unspecified: Secondary | ICD-10-CM | POA: Diagnosis not present

## 2016-08-25 DIAGNOSIS — R0902 Hypoxemia: Secondary | ICD-10-CM | POA: Diagnosis not present

## 2016-08-25 MED ORDER — LEVOFLOXACIN 750 MG PO TABS: 750.0000 mg | ORAL_TABLET | Freq: Every day | ORAL | 0 refills | Status: AC

## 2016-08-25 MED ORDER — LEVOFLOXACIN 750 MG PO TABS
750.0000 mg | ORAL_TABLET | Freq: Once | ORAL | Status: AC
  Administered 2016-08-25: 750 mg via ORAL
  Filled 2016-08-25: qty 1

## 2016-08-25 MED ORDER — PREDNISONE 20 MG PO TABS: ORAL_TABLET | ORAL | 0 refills | Status: DC

## 2016-08-25 MED ORDER — PREDNISONE 20 MG PO TABS
60.0000 mg | ORAL_TABLET | Freq: Once | ORAL | Status: AC
  Administered 2016-08-25: 60 mg via ORAL
  Filled 2016-08-25: qty 3

## 2016-08-25 MED ORDER — ALBUTEROL SULFATE (2.5 MG/3ML) 0.083% IN NEBU
5.0000 mg | INHALATION_SOLUTION | Freq: Once | RESPIRATORY_TRACT | Status: AC
Start: 2016-08-25 — End: 2016-08-25
  Administered 2016-08-25: 5 mg via RESPIRATORY_TRACT
  Filled 2016-08-25: qty 6

## 2016-08-25 MED ORDER — IPRATROPIUM-ALBUTEROL 18-103 MCG/ACT IN AERO: 2.0000 | INHALATION_SPRAY | RESPIRATORY_TRACT | 0 refills | Status: DC | PRN

## 2016-08-25 MED ORDER — IPRATROPIUM BROMIDE 0.02 % IN SOLN
0.5000 mg | Freq: Once | RESPIRATORY_TRACT | Status: AC
  Administered 2016-08-25: 0.5 mg via RESPIRATORY_TRACT
  Filled 2016-08-25: qty 2.5

## 2016-08-25 MED ORDER — ACETAMINOPHEN 325 MG PO TABS
650.0000 mg | ORAL_TABLET | Freq: Once | ORAL | Status: AC
  Administered 2016-08-25: 650 mg via ORAL
  Filled 2016-08-25: qty 2

## 2016-08-25 MED ORDER — ALBUTEROL SULFATE (2.5 MG/3ML) 0.083% IN NEBU
5.0000 mg | INHALATION_SOLUTION | Freq: Once | RESPIRATORY_TRACT | Status: AC
  Administered 2016-08-25: 5 mg via RESPIRATORY_TRACT
  Filled 2016-08-25: qty 6

## 2016-08-25 NOTE — ED Notes (Signed)
RRT called for neb tx

## 2016-08-25 NOTE — ED Notes (Signed)
After the pt told me he wanted to leave he then ambulated for me in the hallway and apologized for being so rude. Pt ambulated with o2 of 98% with no assistance.

## 2016-08-25 NOTE — ED Notes (Signed)
Patient was alert, oriented and stable upon discharge. RN went over AVS and patient had no further questions.  

## 2016-08-25 NOTE — ED Triage Notes (Signed)
Per EMS: Pt states that he has been having productive cough for 3 weeks after receiving a flu shot.  Denies chest pain.

## 2016-08-25 NOTE — Discharge Instructions (Signed)
Continue to stay well-hydrated. Continue to alternate between Tylenol and Ibuprofen for pain or fever. Use Mucinex for cough suppression/expectoration of mucus. Use netipot and flonase to help with nasal congestion. May consider over-the-counter Benadryl or other antihistamine to decrease secretions and for watery itchy eyes. Use inhaler as directed, as needed for cough/chest congestion/wheezing/shortness of breath. Take antibiotic and prednisone as directed and until completed, starting tomorrow 08/26/16 since you were given today's doses already. STOP SMOKING! Followup with your primary care doctor in 5-7 days for recheck of ongoing symptoms. Return to emergency department for emergent changing or worsening of symptoms.

## 2016-08-25 NOTE — ED Provider Notes (Signed)
[facility] DEPT Provider Note   CSN: [id number] Arrival date & time: 08/25/16  1151     History   Chief Complaint Chief Complaint  Patient presents with  . Cough    HPI Paul Clayton is a 70 y.o. male with a PMHx of CAD, CHF, GERD, COPD, HTN, HepC, tobacco abuse, and Afib on Xarelto, who presents to the ED with complaints of cough 2 months since receiving the flu shot on 06/20/16 (of note, pt initially said it was 3 weeks of cough, but then when chart review revealed that his flu shot was on 06/20/16 he said that the cough has been going on since then). Patient states that his cough has had yellow sputum production, and he's had some clear rhinorrhea as well as wheezing and mild shortness of breath. He also reports that he is needing to prop himself up more at night due to the shortness of breath, still only using one pillow but feels like he needs sleep on more of an incline. He sleeps on the couch by preference because the cable TV is downstairs so he sleeps there. Has not needed any more pillows than normal, just states that he props himself up more. He has tried TheraFlu and Mucinex without relief, ran out of his inhaler last month but has been using a friend's with some relief. Denies any aggravating factors. Continues to smoke cigarettes. Denies any known sick contacts. No family history of cardiac disease. Takes his Xarelto as prescribed. He denies any hemoptysis, fevers, chills, sore throat, ear pain or drainage, chest pain, leg swelling, recent travel/surgery/immobilization, personal or family history of DVT/PE, abdominal pain, nausea, vomiting, diarrhea, constipation, dysuria, hematuria, numbness, tingling, focal weakness, lightheadedness, or diaphoresis.   The history is provided by the patient and medical records. No language interpreter was used.  Cough  This is a new problem. The current episode started more than 1 week ago. The problem occurs constantly. The problem has been  gradually worsening. The cough is productive of sputum. There has been no fever. Associated symptoms include rhinorrhea, shortness of breath and wheezing. Pertinent negatives include no chest pain, no chills, no ear pain, no sore throat and no myalgias. Treatments tried: inhaler and mucinex and theraflu. The treatment provided mild relief. He is a smoker. His past medical history is significant for COPD.    Past Medical History:  Diagnosis Date  . 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 Anxiety   . Arthritis    "left hip" (04/19/2015)  . CAD (coronary artery disease)    a. LHC 1/16:  LM 20%, oLAD 30%, oLCx 30%, pRCA 30%  . Chronic lower back pain   . Chronic systolic CHF (congestive heart failure) (HCC)    a. EF previously 15%; b. Echo 7/16:  EF 30-35%, diff HK, gr 1 DD, mild MR, mild LAE, mild reduced RVSF  . COPD (chronic obstructive pulmonary disease) ([place])   . Depression   . GERD (gastroesophageal reflux disease)   . Hepatitis C   . Hypertension   . Migraine    "last one was back in the 1980's" (04/19/2015)  . NICM (nonischemic cardiomyopathy) ([place])    no obs CAD on LHC in 1/16 - ? HTN or substance abuse  . Persistent atrial fibrillation (HCC)    a. s/p DCCV; b. Amiodarone  . Substance abuse    ETOH; crack cocaine  . Tobacco abuse     Patient Active Problem List   Diagnosis  Date Noted  . Health care maintenance 06/20/2016  . Urinary incontinence 05/03/2015  . LBP radiating to left leg 03/12/2015  . Nonischemic cardiomyopathy ([place]) 03/05/2015  . Hx of substance abuse 10/30/2014  . Faintness   . Homeless single person   . Atrial fibrillation with RVR ([place]) 10/13/2014  . High risk social situation 09/30/2014  . Colon cancer ([place]) 09/26/2014  . HFrEF (heart failure with reduced ejection fraction) ([place]) 09/12/2014  . A-fib ([place]) 09/12/2014  . COPD (chronic obstructive pulmonary disease) ([place]) 09/12/2014    Past Surgical History:  Procedure Laterality Date  . CARDIAC  CATHETERIZATION  10/23/2014  . CARDIOVERSION     "I've had 2 in all" (04/19/2015)  . CARDIOVERSION N/A 01/30/2015   Procedure: CARDIOVERSION;  Surgeon: Larey Dresser, MD;  Location: [facility];  Service: Cardiovascular;  Laterality: N/A;  . COLONOSCOPY    . COLONOSCOPY WITH PROPOFOL N/A 11/30/2014   Procedure: COLONOSCOPY WITH PROPOFOL;  Surgeon: Milus Banister, MD;  Location: [facility];  Service: Endoscopy;  Laterality: N/A;  . LEFT AND RIGHT HEART CATHETERIZATION WITH CORONARY ANGIOGRAM N/A 10/23/2014   Procedure: LEFT AND RIGHT HEART CATHETERIZATION WITH CORONARY ANGIOGRAM;  Surgeon: Larey Dresser, MD;  Location: [facility] CATH LAB;  Service: Cardiovascular;  Laterality: N/A;       Home Medications    Prior to Admission medications   Medication Sig Start Date End Date Taking? Authorizing Provider  acetaminophen (TYLENOL) 325 MG tablet Take 2 tablets (650 mg total) by mouth every 6 (six) hours as needed. 12/20/15   Nona Dell, PA-C  amiodarone (PACERONE) 200 MG tablet Take 1 tablet (200 mg total) by mouth daily. 08/20/15   Larey Dresser, MD  atorvastatin (LIPITOR) 20 MG tablet Take 1 tablet (20 mg total) by mouth daily. 08/20/15   Larey Dresser, MD  carvedilol (COREG) 6.25 MG tablet Take 1 tablet (6.25 mg total) by mouth 2 (two) times daily with a meal. 06/25/16   Larey Dresser, MD  HYDROcodone-acetaminophen (NORCO/VICODIN) 5-325 MG tablet Take 1-2 tablets by mouth every 6 (six) hours as needed for moderate pain. Patient not taking: Reported on 06/24/2016 05/27/16   Lajean Saver, MD  lidocaine (LIDODERM) 5 % Place 1 patch onto the skin daily. Remove & Discard patch within 12 hours or as directed by MD Patient not taking: Reported on 06/24/2016 06/20/16   Sela Hua, MD  losartan (COZAAR) 25 MG tablet Take 1 tablet (25 mg total) by mouth daily. 08/23/15   Larey Dresser, MD  mometasone-formoterol [facility]) 200-5 MCG/ACT AERO Inhale 2 puffs into the lungs 2 (two) times daily as  needed for wheezing. 01/06/15   Leone Brand, MD  pantoprazole (PROTONIX) 40 MG tablet Take 1 tablet (40 mg total) by mouth daily. 08/20/15   Larey Dresser, MD  pregabalin (LYRICA) 50 MG capsule Take 1 capsule (50 mg total) by mouth 2 (two) times daily. 06/20/16   Sela Hua, MD  spironolactone (ALDACTONE) 25 MG tablet Take 1 tablet (25 mg total) by mouth daily. 08/20/15   Larey Dresser, MD  Umeclidinium Bromide (INCRUSE ELLIPTA) 62.5 MCG/INH AEPB Inhale 1 Inhaler into the lungs daily. Patient not taking: Reported on 06/24/2016 10/06/15   Olam Idler, MD  XARELTO 20 MG TABS tablet TAKE ONE TABLET BY MOUTH DAILY 07/28/16   Larey Dresser, MD    Family History Family History  Problem Relation Age of Onset  . Hypertension Mother  Social History Social History  Substance Use Topics  . Smoking status: Current Every Day Smoker    Packs/day: 0.10    Years: 52.00    Types: Cigarettes    Last attempt to quit: 09/01/2014  . Smokeless tobacco: Never Used     Comment: 06/20/16-started back smoking; pack lasts 1 mo; wants to quit again  . Alcohol use 0.0 oz/week     Comment: 04/19/2015 "I might drink a 40oz beer/month"     Allergies   Patient has no known allergies.   Review of Systems Review of Systems  Constitutional: Negative for chills, diaphoresis and fever.  HENT: Positive for rhinorrhea. Negative for ear discharge, ear pain, sore throat and trouble swallowing.   Respiratory: Positive for cough, shortness of breath and wheezing.   Cardiovascular: Negative for chest pain and leg swelling.  Gastrointestinal: Negative for abdominal pain, constipation, diarrhea, nausea and vomiting.  Genitourinary: Negative for dysuria and hematuria.  Musculoskeletal: Negative for arthralgias and myalgias.  Skin: Negative for color change.  Allergic/Immunologic: Negative for immunocompromised state.  Neurological: Negative for weakness, light-headedness and numbness.  Hematological:  Bruises/bleeds easily (on xarelto).  Psychiatric/Behavioral: Negative for confusion.   10 Systems reviewed and are negative for acute change except as noted in the HPI.   Physical Exam Updated Vital Signs BP 91/63 (BP Location: Right Arm)   Pulse 92   Temp 98.2 F (36.8 C) (Oral)   Resp 18   SpO2 97%   Physical Exam  Constitutional: He is oriented to person, place, and time. Vital signs are normal. He appears well-developed and well-nourished.  Non-toxic appearance. No distress.  Afebrile, nontoxic, NAD  HENT:  Head: Normocephalic and atraumatic.  Nose: Mucosal edema and rhinorrhea present.  Mouth/Throat: Uvula is midline, oropharynx is clear and moist and mucous membranes are normal. No trismus in the jaw. No uvula swelling. Tonsils are 0 on the right. Tonsils are 0 on the left. No tonsillar exudate.  Nose with mild mucosal edema and clear rhinorrhea. Oropharynx clear and moist, without uvular swelling or deviation, no trismus or drooling, no tonsillar swelling or erythema, no exudates.    Eyes: Conjunctivae and EOM are normal. Right eye exhibits no discharge. Left eye exhibits no discharge.  Neck: Normal range of motion. Neck supple.  Cardiovascular: Normal rate, regular rhythm, normal heart sounds and intact distal pulses.  Exam reveals no gallop and no friction rub.   No murmur heard. RRR, nl s1/s2, no m/r/g, distal pulses intact, no pedal edema   Pulmonary/Chest: Effort normal. No respiratory distress. He has no decreased breath sounds. He has wheezes. He has rhonchi. He has no rales.  Scattered rhonchi throughout with faint expiratory wheezing, no rales, no hypoxia or increased WOB, speaking in full sentences, SpO2 97% on RA   Abdominal: Soft. Normal appearance and bowel sounds are normal. He exhibits no distension. There is no tenderness. There is no rigidity, no rebound, no guarding, no CVA tenderness, no tenderness at McBurney's point and negative Murphy's sign.    Musculoskeletal: Normal range of motion.  MAE x4 Strength and sensation grossly intact Distal pulses intact Gait steady No pedal edema, neg homan's bilaterally   Neurological: He is alert and oriented to person, place, and time. He has normal strength. No sensory deficit.  Skin: Skin is warm, dry and intact. No rash noted.  Psychiatric: He has a normal mood and affect.  Nursing note and vitals reviewed.    ED Treatments / Results  Labs (all labs ordered  are listed, but only abnormal results are displayed) Labs Reviewed - No data to display  EKG  EKG Interpretation None       Radiology Dg Chest 2 View  Result Date: 08/25/2016 CLINICAL DATA:  Cough EXAM: CHEST  2 VIEW COMPARISON:  08/04/2015 FINDINGS: Left lower lobe airspace disease. Probable pneumonia given the history. Right lung clear. No pleural effusion. Negative for heart failure. Pulmonary hyperinflation compatible chronic lung disease. Chronic AC separation on the right. IMPRESSION: Left lower lobe infiltrate.  Probable pneumonia. Electronically Signed   By: Franchot Gallo M.D.   On: 08/25/2016 14:29    Procedures Procedures (including critical care time)  Medications Ordered in ED Medications  acetaminophen (TYLENOL) tablet 650 mg (not administered)  predniSONE (DELTASONE) tablet 60 mg (60 mg Oral Given 08/25/16 1408)  albuterol (PROVENTIL) (2.5 MG/3ML) 0.083% nebulizer solution 5 mg (5 mg Nebulization Given 08/25/16 1429)  ipratropium (ATROVENT) nebulizer solution 0.5 mg (0.5 mg Nebulization Given 08/25/16 1429)  albuterol (PROVENTIL) (2.5 MG/3ML) 0.083% nebulizer solution 5 mg (5 mg Nebulization Given 08/25/16 1558)  ipratropium (ATROVENT) nebulizer solution 0.5 mg (0.5 mg Nebulization Given 08/25/16 1558)  levofloxacin (LEVAQUIN) tablet 750 mg (750 mg Oral Given 08/25/16 1559)     Initial Impression / Assessment and Plan / ED Course  I have reviewed the triage vital signs and the nursing notes.  Pertinent labs  & imaging results that were available during my care of the patient were reviewed by me and considered in my medical decision making (see chart for details).  Clinical Course     70 y.o. male here with cough x2 months with yellow sputum production. Some clear rhinorrhea as well. +Smoker, +COPD pt. Lung sounds with scattered rhonchi and some faint wheezing throughout. No LE swelling, no tachycardia or hypoxia, pt on xarelto so doubt PE as cause; doubt CHF exacerbation. Will give prednisone and duoneb, get CXR, and reassess shortly.   3:47 PM CXR showing LLL PNA. Pt feeling better after nebs, lung sounds somewhat improved after neb tx but still rhonchorous and slightly wheezy, opened up significantly in the LLL area and now sounds very congested focally in that area. Will repeat neb tx, give first dose of levaquin here, and then reassess. Will need to ambulate prior to discharge, but likely will be able to go home after next breathing tx.   5:11 PM Lung sounds greatly improved after nebs. Pt requesting tylenol for L rib pain that he has when he coughs; will ambulate now to see if he desats, as long as he can ambulate then he can go home. Will reassess after ambulation  5:27 PM Pt stating he doesn't want to ambulate, and then when I asked that he do it he agreed, then told the tech that he's leaving. Pt refusing to perform ambulation to evaluate for desats, but states he's going to leave. At this point, I see no indication to admit based on his refusal to ambulate, so will d/c home. Will send home with prednisone burst, inhaler for combivent, and levaquin x6 more days, starting tomorrow. Discussed OTC meds for pain/symptom control. Discussed F/up with PCP in 1wk for recheck. Smoking cessation encouraged. I explained the diagnosis and have given explicit precautions to return to the ER including for any other new or worsening symptoms. The patient understands and accepts the medical plan as it's been  dictated and I have answered their questions. Discharge instructions concerning home care and prescriptions have been given. The patient is STABLE and  is discharged to home in good condition.   Final Clinical Impressions(s) / ED Diagnoses   Final diagnoses:  Community acquired pneumonia of left lower lobe of lung (HCC)  COPD exacerbation (HCC)  SOB (shortness of breath)  Tobacco use    New Prescriptions New Prescriptions   ALBUTEROL-IPRATROPIUM (COMBIVENT) 18-103 MCG/ACT INHALER    Inhale 2 puffs into the lungs every 4 (four) hours as needed for wheezing or shortness of breath.   LEVOFLOXACIN (LEVAQUIN) 750 MG TABLET    Take 1 tablet (750 mg total) by mouth daily. X 6 days starting 08/26/16   PREDNISONE (DELTASONE) 20 MG TABLET    3 tabs po daily x 4 days starting 08/26/16     Rosealynn Mateus Camprubi-Soms, PA-C 08/25/16 1728  ADDENDUM: 5:42 PM  Pt ambulated and maintained SpO2 98% during ambulation. Safe for discharge with previously outlined plan.    Sunita Demond Camprubi-Soms, PA-C 08/25/16 [place], MD 08/27/16 [phone]

## 2016-08-25 NOTE — ED Notes (Signed)
Pt is refusing to get up and walk

## 2016-08-25 NOTE — Progress Notes (Signed)
Pt confirms pcp remains Dr Brett Albino and Dr Einar Crow is [facility]  Pt with 6 ED visits no admissions PMHx of CAD, CHF, GERD, COPD, HTN, HepC, tobacco abuse, and Afib on Xarelto, who presents to the ED with complaints of cough 2 months since receiving the flu shot on 06/20/16  [facility] availability NO ED  CP  [facility] referral entered in [facility]

## 2016-08-26 ENCOUNTER — Ambulatory Visit (HOSPITAL_COMMUNITY)
Admission: RE | Admit: 2016-08-26 | Discharge: 2016-08-26 | Disposition: A | Discharge status: Home / Self Care | Payer: Medicare Other | Source: Ambulatory Visit | Attending: Internal Medicine | Admitting: Internal Medicine

## 2016-08-26 ENCOUNTER — Emergency Department (HOSPITAL_COMMUNITY)
Admission: EM | Admit: 2016-08-26 | Discharge: 2016-08-26 | Disposition: A | Discharge status: Home / Self Care | Payer: Medicare Other | Attending: Emergency Medicine | Admitting: Emergency Medicine

## 2016-08-26 ENCOUNTER — Encounter (HOSPITAL_COMMUNITY): Payer: Self-pay | Admitting: Emergency Medicine

## 2016-08-26 VITALS — BP 128/70 | HR 77 | Wt 192.4 lb

## 2016-08-26 DIAGNOSIS — Z79899 Other long term (current) drug therapy: Secondary | ICD-10-CM | POA: Insufficient documentation

## 2016-08-26 DIAGNOSIS — I480 Paroxysmal atrial fibrillation: Secondary | ICD-10-CM | POA: Insufficient documentation

## 2016-08-26 DIAGNOSIS — Z85038 Personal history of other malignant neoplasm of large intestine: Secondary | ICD-10-CM | POA: Insufficient documentation

## 2016-08-26 DIAGNOSIS — C189 Malignant neoplasm of colon, unspecified: Secondary | ICD-10-CM | POA: Insufficient documentation

## 2016-08-26 DIAGNOSIS — F1410 Cocaine abuse, uncomplicated: Secondary | ICD-10-CM | POA: Diagnosis not present

## 2016-08-26 DIAGNOSIS — I2510 Atherosclerotic heart disease of native coronary artery without angina pectoris: Secondary | ICD-10-CM | POA: Insufficient documentation

## 2016-08-26 DIAGNOSIS — J189 Pneumonia, unspecified organism: Secondary | ICD-10-CM | POA: Insufficient documentation

## 2016-08-26 DIAGNOSIS — J449 Chronic obstructive pulmonary disease, unspecified: Secondary | ICD-10-CM | POA: Insufficient documentation

## 2016-08-26 DIAGNOSIS — Z7901 Long term (current) use of anticoagulants: Secondary | ICD-10-CM | POA: Insufficient documentation

## 2016-08-26 DIAGNOSIS — I5022 Chronic systolic (congestive) heart failure: Secondary | ICD-10-CM | POA: Diagnosis not present

## 2016-08-26 DIAGNOSIS — E785 Hyperlipidemia, unspecified: Secondary | ICD-10-CM | POA: Insufficient documentation

## 2016-08-26 DIAGNOSIS — Z87898 Personal history of other specified conditions: Secondary | ICD-10-CM | POA: Diagnosis not present

## 2016-08-26 DIAGNOSIS — I428 Other cardiomyopathies: Secondary | ICD-10-CM

## 2016-08-26 DIAGNOSIS — H6121 Impacted cerumen, right ear: Secondary | ICD-10-CM

## 2016-08-26 DIAGNOSIS — K219 Gastro-esophageal reflux disease without esophagitis: Secondary | ICD-10-CM | POA: Diagnosis not present

## 2016-08-26 DIAGNOSIS — H9201 Otalgia, right ear: Secondary | ICD-10-CM | POA: Diagnosis present

## 2016-08-26 DIAGNOSIS — I429 Cardiomyopathy, unspecified: Secondary | ICD-10-CM | POA: Insufficient documentation

## 2016-08-26 DIAGNOSIS — F17210 Nicotine dependence, cigarettes, uncomplicated: Secondary | ICD-10-CM | POA: Insufficient documentation

## 2016-08-26 DIAGNOSIS — J440 Chronic obstructive pulmonary disease with acute lower respiratory infection: Secondary | ICD-10-CM | POA: Insufficient documentation

## 2016-08-26 DIAGNOSIS — I509 Heart failure, unspecified: Secondary | ICD-10-CM | POA: Insufficient documentation

## 2016-08-26 DIAGNOSIS — F1911 Other psychoactive substance abuse, in remission: Secondary | ICD-10-CM

## 2016-08-26 DIAGNOSIS — I110 Hypertensive heart disease with heart failure: Secondary | ICD-10-CM | POA: Diagnosis not present

## 2016-08-26 MED ORDER — ATORVASTATIN CALCIUM 20 MG PO TABS: 20.0000 mg | ORAL_TABLET | Freq: Every day | ORAL | 3 refills | Status: DC

## 2016-08-26 MED ORDER — AMIODARONE HCL 200 MG PO TABS: 200.0000 mg | ORAL_TABLET | Freq: Every day | ORAL | 3 refills | Status: DC

## 2016-08-26 MED ORDER — DOCUSATE SODIUM 50 MG/5ML PO LIQD
50.0000 mg | Freq: Once | ORAL | Status: AC
  Administered 2016-08-26: 50 mg via OTIC
  Filled 2016-08-26: qty 10

## 2016-08-26 MED ORDER — SPIRONOLACTONE 25 MG PO TABS: 25.0000 mg | ORAL_TABLET | Freq: Every day | ORAL | 3 refills | Status: DC

## 2016-08-26 NOTE — Progress Notes (Signed)
CSW referred and met with patient in the clinic due to admitted crack use. Patient states long history of drug use and years of being incarcerated off and on. Patient admits to recent use and states that "money and women are my triggers". Patient states that he is motivated to remain drug free due to his current health needs. He attends a daily rehab program that focuses on support and drug free lifestyle.  CSW spent time talking about coping strategies and support mechanisms in his recovery. Patient appeared interested in further support from the HF team and his recovery. CSW will continue to follow and provide support as needed. Raquel Sarna, LCSW [phone]

## 2016-08-26 NOTE — ED Provider Notes (Signed)
[facility] DEPT Provider Note   CSN: [id number] Arrival date & time: 08/26/16  1118     History   Chief Complaint Chief Complaint  Patient presents with  . Otalgia    HPI Paul Clayton is a 70 y.o. male.He complains of pain and decreased hearing in his right ear. He states he frequently gets cerumen buildup. He states he "can't hear at all and requested to be cleaned are irrigated free.  HPI  Past Medical History:  Diagnosis Date  . 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 Anxiety   . Arthritis    "left hip" (04/19/2015)  . CAD (coronary artery disease)    a. LHC 1/16:  LM 20%, oLAD 30%, oLCx 30%, pRCA 30%  . Chronic lower back pain   . Chronic systolic CHF (congestive heart failure) (HCC)    a. EF previously 15%; b. Echo 7/16:  EF 30-35%, diff HK, gr 1 DD, mild MR, mild LAE, mild reduced RVSF  . COPD (chronic obstructive pulmonary disease) ([place])   . Depression   . GERD (gastroesophageal reflux disease)   . Hepatitis C   . Hypertension   . Migraine    "last one was back in the 1980's" (04/19/2015)  . NICM (nonischemic cardiomyopathy) ([place])    no obs CAD on LHC in 1/16 - ? HTN or substance abuse  . Persistent atrial fibrillation (HCC)    a. s/p DCCV; b. Amiodarone  . Substance abuse    ETOH; crack cocaine  . Tobacco abuse     Patient Active Problem List   Diagnosis Date Noted  . Health care maintenance 06/20/2016  . Urinary incontinence 05/03/2015  . LBP radiating to left leg 03/12/2015  . Nonischemic cardiomyopathy ([place]) 03/05/2015  . Hx of substance abuse 10/30/2014  . Faintness   . Homeless single person   . Atrial fibrillation with RVR ([place]) 10/13/2014  . High risk social situation 09/30/2014  . Colon cancer ([place]) 09/26/2014  . HFrEF (heart failure with reduced ejection fraction) ([place]) 09/12/2014  . A-fib ([employer]) 09/12/2014  . COPD (chronic obstructive pulmonary disease) ([place]) 09/12/2014    Past Surgical History:  Procedure Laterality Date  .  CARDIAC CATHETERIZATION  10/23/2014  . CARDIOVERSION     "I've had 2 in all" (04/19/2015)  . CARDIOVERSION N/A 01/30/2015   Procedure: CARDIOVERSION;  Surgeon: Larey Dresser, MD;  Location: [facility];  Service: Cardiovascular;  Laterality: N/A;  . COLONOSCOPY    . COLONOSCOPY WITH PROPOFOL N/A 11/30/2014   Procedure: COLONOSCOPY WITH PROPOFOL;  Surgeon: Milus Banister, MD;  Location: [facility];  Service: Endoscopy;  Laterality: N/A;  . LEFT AND RIGHT HEART CATHETERIZATION WITH CORONARY ANGIOGRAM N/A 10/23/2014   Procedure: LEFT AND RIGHT HEART CATHETERIZATION WITH CORONARY ANGIOGRAM;  Surgeon: Larey Dresser, MD;  Location[facility] C[facility] LAB;  Service: Cardiovascular;  Laterality: N/A;       Home Medications    Prior to Admission medications   Medication Sig Start Date End Date Taking? Authorizing Provider  acetaminophen (TYLENOL) 325 MG tablet Take 2 tablets (650 mg total) by mouth every 6 (six) hours as needed. Patient taking differently: Take 650 mg by mouth every 6 (six) hours as needed for moderate pain.  12/20/15   Nona Dell, PA-C  albuterol-ipratropium (COMBIVENT) 18-103 MCG/ACT inhaler Inhale 2 puffs into the lungs every 4 (four) hours as needed for wheezing or shortness of breath. 08/25/16   Mercedes Camprubi-Soms, PA-C  amiodarone (PACERONE) 200  MG tablet Take 1 tablet (200 mg total) by mouth daily. Patient not taking: Reported on 08/25/2016 08/20/15   Larey Dresser, MD  amiodarone (PACERONE) 200 MG tablet TAKE 1 TABLET (200 MG TOTAL) BY MOUTH DAILY. 08/26/16   Larey Dresser, MD  atorvastatin (LIPITOR) 20 MG tablet Take 1 tablet (20 mg total) by mouth daily. 08/20/15   Larey Dresser, MD  atorvastatin (LIPITOR) 20 MG tablet TAKE 1 TABLET (20 MG TOTAL) BY MOUTH DAILY. 08/26/16   Larey Dresser, MD  carvedilol (COREG) 3.125 MG tablet Take 3.125 mg by mouth daily.  05/30/16   Historical Provider, MD  carvedilol (COREG) 6.25 MG tablet Take 1 tablet (6.25 mg total) by mouth  2 (two) times daily with a meal. 06/25/16   Larey Dresser, MD  furosemide (LASIX) 40 MG tablet Take 40 mg by mouth daily.    Historical Provider, MD  furosemide (LASIX) 40 MG tablet TAKE 1 TABLET BY MOUTH   DAILY 08/26/16   Larey Dresser, MD  HYDROcodone-acetaminophen (NORCO/VICODIN) 5-325 MG tablet Take 1-2 tablets by mouth every 6 (six) hours as needed for moderate pain. Patient not taking: Reported on 08/25/2016 05/27/16   Lajean Saver, MD  levofloxacin (LEVAQUIN) 750 MG tablet Take 1 tablet (750 mg total) by mouth daily. X 6 days starting 08/26/16 08/26/16 09/01/16  Mercedes Camprubi-Soms, PA-C  lidocaine (LIDODERM) 5 % Place 1 patch onto the skin daily. Remove & Discard patch within 12 hours or as directed by MD Patient not taking: Reported on 08/25/2016 06/20/16   Sela Hua, MD  losartan (COZAAR) 25 MG tablet Take 1 tablet (25 mg total) by mouth daily. 08/23/15   Larey Dresser, MD  mometasone-formoterol [facility]) 200-5 MCG/ACT AERO Inhale 2 puffs into the lungs 2 (two) times daily as needed for wheezing. 01/06/15   Leone Brand, MD  pantoprazole (PROTONIX) 40 MG tablet Take 1 tablet (40 mg total) by mouth daily. 08/20/15   Larey Dresser, MD  predniSONE (DELTASONE) 20 MG tablet 3 tabs po daily x 4 days starting 08/26/16 08/26/16   Mercedes Camprubi-Soms, PA-C  pregabalin (LYRICA) 50 MG capsule Take 1 capsule (50 mg total) by mouth 2 (two) times daily. 06/20/16   Sela Hua, MD  spironolactone (ALDACTONE) 25 MG tablet Take 1 tablet (25 mg total) by mouth daily. Patient not taking: Reported on 08/25/2016 08/20/15   Larey Dresser, MD  tiotropium (SPIRIVA) 18 MCG inhalation capsule Place 18 mcg into inhaler and inhale daily.  11/22/15 11/21/16  Historical Provider, MD  Umeclidinium Bromide (INCRUSE ELLIPTA) 62.5 MCG/INH AEPB Inhale 1 Inhaler into the lungs daily. Patient not taking: Reported on 08/25/2016 10/06/15   Olam Idler, MD  XARELTO 20 MG TABS tablet TAKE ONE TABLET BY MOUTH DAILY 07/28/16    Larey Dresser, MD    Family History Family History  Problem Relation Age of Onset  . Hypertension Mother     Social History Social History  Substance Use Topics  . Smoking status: Current Every Day Smoker    Packs/day: 0.10    Years: 52.00    Types: Cigarettes    Last attempt to quit: 09/01/2014  . Smokeless tobacco: Never Used     Comment: 06/20/16-started back smoking; pack lasts 1 mo; wants to quit again  . Alcohol use 0.0 oz/week     Comment: 04/19/2015 "I might drink a 40oz beer/month"     Allergies   Patient has no known allergies.  Review of Systems Review of Systems  Constitutional: Negative for appetite change, chills, diaphoresis, fatigue and fever.  HENT: Positive for ear pain and hearing loss. Negative for mouth sores, sore throat and trouble swallowing.   Eyes: Negative for visual disturbance.  Respiratory: Negative for cough, chest tightness, shortness of breath and wheezing.   Cardiovascular: Negative for chest pain.  Gastrointestinal: Negative for abdominal distention, abdominal pain, diarrhea, nausea and vomiting.  Endocrine: Negative for polydipsia, polyphagia and polyuria.  Genitourinary: Negative for dysuria, frequency and hematuria.  Musculoskeletal: Negative for gait problem.  Skin: Negative for color change, pallor and rash.  Neurological: Negative for dizziness, syncope, light-headedness and headaches.  Hematological: Does not bruise/bleed easily.  Psychiatric/Behavioral: Negative for behavioral problems and confusion.     Physical Exam Updated Vital Signs BP 123/78   Pulse 76   Temp 98.3 F (36.8 C) (Oral)   Resp 16   SpO2 100%   Physical Exam  Constitutional: He is oriented to person, place, and time. He appears well-developed and well-nourished. No distress.  HENT:  Head: Normocephalic.  Right ear external canal is impacted with hard firm black cerumen.  Eyes: Conjunctivae are normal. Pupils are equal, round, and reactive to  light. No scleral icterus.  Neck: Normal range of motion. Neck supple. No thyromegaly present.  Cardiovascular: Normal rate and regular rhythm.  Exam reveals no gallop and no friction rub.   No murmur heard. Pulmonary/Chest: Effort normal and breath sounds normal. No respiratory distress. He has no wheezes. He has no rales.  Abdominal: Soft. Bowel sounds are normal. He exhibits no distension. There is no tenderness. There is no rebound.  Musculoskeletal: Normal range of motion.  Neurological: He is alert and oriented to person, place, and time.  Skin: Skin is warm and dry. No rash noted.  Psychiatric: He has a normal mood and affect. His behavior is normal.     ED Treatments / Results  Labs (all labs ordered are listed, but only abnormal results are displayed) Labs Reviewed - No data to display  EKG  EKG Interpretation None       Radiology Dg Chest 2 View  Result Date: 08/25/2016 CLINICAL DATA:  Cough EXAM: CHEST  2 VIEW COMPARISON:  08/04/2015 FINDINGS: Left lower lobe airspace disease. Probable pneumonia given the history. Right lung clear. No pleural effusion. Negative for heart failure. Pulmonary hyperinflation compatible chronic lung disease. Chronic AC separation on the right. IMPRESSION: Left lower lobe infiltrate.  Probable pneumonia. Electronically Signed   By: Franchot Gallo M.D.   On: 08/25/2016 14:29    Procedures Procedures (including critical care time)  Medications Ordered in ED Medications  docusate (COLACE) 50 MG/5ML liquid 50 mg (50 mg Both Ears Given 08/26/16 1244)     Initial Impression / Assessment and Plan / ED Course  I have reviewed the triage vital signs and the nursing notes.  Pertinent labs & imaging results that were available during my care of the patient were reviewed by me and considered in my medical decision making (see chart for details).  Clinical Course     Plan Colace then attempted irrigation.  Final Clinical Impressions(s) / ED  Diagnoses   Final diagnoses:  Impacted cerumen of right ear   He was irrigated and debrided of a large amount of cerumen. He will continue use the drops at home and irrigate with bulb syringe.  New Prescriptions New Prescriptions   No medications on file     Tanna Furry, MD 08/26/16 1340

## 2016-08-26 NOTE — ED Triage Notes (Signed)
Pt states "I have a problem with wax buildup in my R ear. I haven't had it flushed in about two years, I just got out of the hospital yesterday for pneumonia". Pt requesting to have his ear flushed out. Hx of COPD, CHF. Pt ambulatory, in NAD.

## 2016-08-26 NOTE — ED Notes (Signed)
Dr. Jeneen Rinks in to see pt.

## 2016-08-26 NOTE — Discharge Instructions (Addendum)
Use drops in your ear.   Leave in for 1 hour, then rinse with syringe.

## 2016-08-26 NOTE — Progress Notes (Signed)
Patient ID: Paul Clayton, male   DOB: 1946-06-29, 70 y.o.   MRN: [id number] PCP: Dr. Brett Albino Cardiology: Dr. Aundra Dubin  70 yo with history of COPD, ETOH and cocaine abuse, HCV, colon cancer, paroxysmal atrial fibrillation, and chronic systolic CHF presents for cardiology followup.  He moved to [place] from [place].  He says that he has had a cardiomyopathy known since 2012. This has been presumed to be due to cocaine and ETOH abuse. He says that he quit ETOH, cocaine, and smoking in 10/15.  He was admitted to [facility] in 12/15 with atrial fibrillation and RVR. He had been out of his Coreg x 2 weeks.  He was diuresed and rate-controlled.  He had had an episode of syncope prior to admission that was thought to be orthostatic given low BP in the setting of atrial fibrillation with RVR.  Echo in 11/15 showed EF 15% with severe MR and TR and moderately decreased RV systolic function.    He had a colonoscopy in 11/15 with a mass found, biopsy showed adenocarcinoma.  Most recent GI evaluation suggests that he will not need colectomy.   LHC/RHC in 1/16 showed no obstructive CAD and mildly elevated filling pressures.  Lasix was increased to 40 mg daily. He was admitted earlier in 1/16 with syncope, possibly defecation syncope.  He wore a Lifevest for a period of time but no longer (ICD not placed given episodes of cocaine use ongoing).  In 4/16, he underwent DCCV to NSR.  He was bradycardic, so Coreg was stopped.    He was admitted in 7/16 with cocaine-related chest pain.    He returns for follow up today. Last visit carvedilol was increased to 6.25 mg twice a day. Yesterday he was evaluated in [facility] for cough. He was prescribed levaquin for pneumonia but he was not picked up levaquin. He continues to use crack and last used the few days ago on November 1st. Says he last drank alcohol on November 1st.  Smokes 1 -2 cigarettes a day. Mild dyspnea with exertion. Says he is going to counseling. Lives alone. Takes  the bus to appointments. .He is currently out of  spiro, amiodarone, and atrovastatin.    Labs (12/15): K 4.3, creatinine 0.82, AST normal, ALT 60, HCT 40.9, digoxin 0.7, BNP 4401, HIV negative Labs (10/28/2014): K 4.5 Creatinine 0.90, digoxin 0.9, LFTs normal  Labs (1/16): K 4.3, creatinine 1.11, HCT 41.5, BNP 255, digoxin 0.6 Labs (12/06/2014): K 4.3 Creatinine 1.18  Labs (4/16): AST 53, ALT 60 Labs (5/16): K 4.3, creatinine 1.17, digoxin 0.7, HCT 36.6, BNP 97.5 Labs (7/16): AST 46, ALT 49, TSH normal Labs (9/16): K 4.7, creatinine 0.93, HCT 39.7 Labs (2/17): K 5, creatinine 0.85, HCT 58.8 Labs (06/25/2016): K 4.3 Creatinine 0.89 BNP 65   PMH: 1. COPD: Quit smoking in 10/15 but now back to smoking a few cigarettes/day.  2. Cocaine abuse: Still uses occasionally.   3. ETOH abuse: Quit ETOH in 10/15.  4. Atrial fibrillation: Paroxysmal.  Reports DCCV x 2 in [place].  DCCV 4/16 => bradycardic => cut back Coreg.  5. HCV 6. Colon cancer: Colonoscopy in [place] with removal of polyp showing colonic adenocarcinoma on pathology.  PET scan in 12/15: probably no metastatic disease.   7. H/o syncope: Thought to be orthostatic. Also had vagal-type syncope associated with defecation.  8. Cardiomyopathy: Known since 2012.  Echo (11/15) with EF 15%, diffuse hypokinesis, severe LV dilation, severe MR (likely functional), moderate to severe LAE, dilated RV  with moderately decreased systolic function, severe TR.  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  HIV negative.  LHC/RHC (1/16) with nonobstructive CAD, EF 20-25%, mean RA 9, PA 44/26, mean PCWP 21, CI 2.1.  Echo (7/16) with EF 30-35%, mildly dilated LV, mildly decreased RV systolic function.   9. GERD 10. Sciatica 11. H/o left femur fracture 12. ECHO EF 25-30%.   SH: Prior ETOH abuse. Still uses cocaine but rare now.  Still smokes a few cigarettes/day.  Moved here from [place].  Daughter lives in [place].   FH: No premature CAD.   ROS: All systems  reviewed and negative except as per HPI.   Current Outpatient Prescriptions  Medication Sig Dispense Refill  . acetaminophen (TYLENOL) 325 MG tablet Take 2 tablets (650 mg total) by mouth every 6 (six) hours as needed. (Patient taking differently: Take 650 mg by mouth every 6 (six) hours as needed for moderate pain. ) 30 tablet 0  . carvedilol (COREG) 6.25 MG tablet Take 1 tablet (6.25 mg total) by mouth 2 (two) times daily with a meal. 60 tablet 3  . lidocaine (LIDODERM) 5 % Place 1 patch onto the skin daily. Remove & Discard patch within 12 hours or as directed by MD 30 patch 0  . losartan (COZAAR) 25 MG tablet Take 1 tablet (25 mg total) by mouth daily. 90 tablet 2  . mometasone-formoterol (DULERA) 200-5 MCG/ACT AERO Inhale 2 puffs into the lungs 2 (two) times daily as needed for wheezing. 13 g 2  . pregabalin (LYRICA) 50 MG capsule Take 1 capsule (50 mg total) by mouth 2 (two) times daily. 120 capsule 0  . tiotropium (SPIRIVA) 18 MCG inhalation capsule Place 18 mcg into inhaler and inhale daily.     Marland Kitchen topiramate (TOPAMAX) 25 MG capsule Take 25 mg by mouth 2 (two) times daily.    Marland Kitchen Umeclidinium Bromide (INCRUSE ELLIPTA) 62.5 MCG/INH AEPB Inhale 1 Inhaler into the lungs daily. 30 each 3  . XARELTO 20 MG TABS tablet TAKE ONE TABLET BY MOUTH DAILY 30 tablet 3  . albuterol-ipratropium (COMBIVENT) 18-103 MCG/ACT inhaler Inhale 2 puffs into the lungs every 4 (four) hours as needed for wheezing or shortness of breath. (Patient not taking: Reported on 08/26/2016) 1 Inhaler 0  . levofloxacin (LEVAQUIN) 750 MG tablet Take 1 tablet (750 mg total) by mouth daily. X 6 days starting 08/26/16 (Patient not taking: Reported on 08/26/2016) 6 tablet 0  . predniSONE (DELTASONE) 20 MG tablet 3 tabs po daily x 4 days starting 08/26/16 (Patient not taking: Reported on 08/26/2016) 12 tablet 0   No current facility-administered medications for this encounter.    BP 128/70 (BP Location: Left Arm, Patient Position: Sitting,  Cuff Size: Normal)   Pulse 77   Wt 192 lb 6.4 oz (87.3 kg)   SpO2 97%   BMI 26.09 kg/m    Filed Weights   08/26/16 1405  Weight: 192 lb 6.4 oz (87.3 kg)   General: NAD. Ambulated in the clinic without difficulty.  Neck: No JVD, no thyromegaly or thyroid nodule.  Lungs: LLL decreased.  CV: Nondisplaced PMI.  Heart irregular S1/S2, no S3/S4, 2/6 HSM LLSB and apex.  No peripheral edema.  No carotid bruit.  Difficult to palpate pedal pulses.  Abdomen: Soft, nontender, no hepatosplenomegaly, no distention.  Skin: Intact without lesions or rashes.  Neurologic: Alert and oriented x 3.  Psych: Normal affect. Extremities: No clubbing or cyanosis.  HEENT: Normal.   Assessment/Plan: 1. Chronic systolic CHF: Cardiomyopathy  with LV EF 30-35% and mild RV dysfunction on 7/16 echo. 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  HIV negative.   NYHA class II. Volume status stable. Does not need lasix.  Continue Coreg to 6.25 mg bid. Will not increase with crack cocaine use.  - Continue losartan 25 daily -Restart 25 mg spiro dialy     - Still  using cocaine => referred to HFSW for drug abuse.  Would not consider ICD until substance abuse stops.  2. Atrial fibrillation: s/p DCCV, now in NSR.  - Reorder amiodarone 200 daily.   Needs regular eye exam.   - Continue Xarelto.   3. HCV: Follow LFTs closely with amiodarone.  4. COPD: I counseled him to quit smoking.   5. Colon cancer: Per primary care.  6. Syncope: No recurrence.  7. Hyperlipidemia: Continue atorvastatin.  8. LLL Pneumonia- Evaluated in the ED 11/6 and was to start levaquin daily. I have asked him to pick up medication today.   Follow up in 2 weeks.   Check LFTs TSH, BMET next visit. Refer to Paramedicine. I have encouraged him to pick up    Thatiana Renbarger NP-C  08/26/2016

## 2016-08-26 NOTE — Patient Instructions (Signed)
RESTART Amiodarone 200 mg tablet once daily.  RESTART Atorvastatin 20 mg tablet once daily.  RESTART Spironolactone 25 mg tablet once daily.  Will refer you to our Heart Failure Paramedicine Program. This program is designed to help support you at home with medication management, vital sign and weight measurements, education about heart failure, and any other needs to support you. A certified EMT will call you to set up your initial appointment in the home, and will be available to you at a weekly basis, free of charge.  Follow up with Amy Clegg NP-C in 2 weeks.  Do the following things EVERYDAY: 1) Weigh yourself in the morning before breakfast. Write it down and keep it in a log. 2) Take your medicines as prescribed 3) Eat low salt foods-Limit salt (sodium) to 2000 mg per day.  4) Stay as active as you can everyday 5) Limit all fluids for the day to less than 2 liters

## 2016-08-26 NOTE — Consult Note (Signed)
   [facility] CM Inpatient Consult   08/26/2016  Paul Clayton 23-May-1946 [id number]     Referral received for [facility] Care Management program. However, patient was discharged from ED. Will refer back to [place] Management office to please assign to [place] for follow up.    Paul Rolling, MSN-Ed, RN,BSN [facility] Liaison [phone]

## 2016-08-26 NOTE — Addendum Note (Signed)
Encounter addended by: Louann Liv, LCSW on: 08/26/2016  4:34 PM<BR>    Actions taken: Sign clinical note

## 2016-09-04 ENCOUNTER — Other Ambulatory Visit: Payer: Self-pay

## 2016-09-04 NOTE — Patient Outreach (Signed)
[facility]) Care Management  09/04/16  Paul Clayton [birth year] [id number]  Attempted outreach to patient without success. Called [phone] and message stated that this is not a working number. Unable to leave a voicemail.  [place] R. Glenette Bookwalter, RN, BSN, [place] Management Coordinator [phone]

## 2016-09-05 ENCOUNTER — Other Ambulatory Visit: Payer: Self-pay

## 2016-09-05 NOTE — Patient Outreach (Signed)
[facility]) Care Management  09/05/16  WINSOR ESS 02-03-1946 [id number]  Attempted to reach patient at [phone] without success. Unable to leave a voicemail as message continues to state that this is not a working number. No other numbers listed for patient. Will make one more attempt next week and send letter if not successful.   [place] R. Rionna Feltes, RN, BSN, [place] Management Coordinator [phone]

## 2016-09-08 ENCOUNTER — Other Ambulatory Visit: Payer: Self-pay

## 2016-09-08 NOTE — Patient Outreach (Signed)
[facility]) Care Management  09/08/16  Paul Clayton Nov 17, 1945 [id number]  Third and final outreach attempt completed without success. Unable to leave a voicemail due to message continuing to state that this is not a working number. No other numbers on file.   Plan- RNCM will send outreach barrier letter and wait 10 business days before completing case closure if no return call has been received.  [name], RN, BSN, [place] Management Coordinator [phone]

## 2016-09-09 ENCOUNTER — Ambulatory Visit (HOSPITAL_COMMUNITY)
Admission: RE | Admit: 2016-09-09 | Discharge: 2016-09-09 | Disposition: A | Discharge status: Home / Self Care | Payer: Medicare Other | Source: Ambulatory Visit | Attending: Cardiology | Admitting: Cardiology

## 2016-09-09 VITALS — BP 124/86 | HR 89 | Wt 199.0 lb

## 2016-09-09 DIAGNOSIS — Z79899 Other long term (current) drug therapy: Secondary | ICD-10-CM | POA: Insufficient documentation

## 2016-09-09 DIAGNOSIS — Z7901 Long term (current) use of anticoagulants: Secondary | ICD-10-CM | POA: Diagnosis not present

## 2016-09-09 DIAGNOSIS — J189 Pneumonia, unspecified organism: Secondary | ICD-10-CM | POA: Diagnosis not present

## 2016-09-09 DIAGNOSIS — I480 Paroxysmal atrial fibrillation: Secondary | ICD-10-CM | POA: Diagnosis not present

## 2016-09-09 DIAGNOSIS — I429 Cardiomyopathy, unspecified: Secondary | ICD-10-CM | POA: Insufficient documentation

## 2016-09-09 DIAGNOSIS — K219 Gastro-esophageal reflux disease without esophagitis: Secondary | ICD-10-CM | POA: Insufficient documentation

## 2016-09-09 DIAGNOSIS — F17210 Nicotine dependence, cigarettes, uncomplicated: Secondary | ICD-10-CM | POA: Diagnosis not present

## 2016-09-09 DIAGNOSIS — E785 Hyperlipidemia, unspecified: Secondary | ICD-10-CM | POA: Insufficient documentation

## 2016-09-09 DIAGNOSIS — I110 Hypertensive heart disease with heart failure: Secondary | ICD-10-CM | POA: Insufficient documentation

## 2016-09-09 DIAGNOSIS — F1410 Cocaine abuse, uncomplicated: Secondary | ICD-10-CM | POA: Insufficient documentation

## 2016-09-09 DIAGNOSIS — I5022 Chronic systolic (congestive) heart failure: Secondary | ICD-10-CM | POA: Diagnosis not present

## 2016-09-09 DIAGNOSIS — Z85038 Personal history of other malignant neoplasm of large intestine: Secondary | ICD-10-CM | POA: Diagnosis not present

## 2016-09-09 DIAGNOSIS — J440 Chronic obstructive pulmonary disease with acute lower respiratory infection: Secondary | ICD-10-CM | POA: Insufficient documentation

## 2016-09-09 DIAGNOSIS — I428 Other cardiomyopathies: Secondary | ICD-10-CM

## 2016-09-09 LAB — BASIC METABOLIC PANEL
Anion gap: 4 — ABNORMAL LOW (ref 5–15)
BUN: 23 mg/dL — AB (ref 6–20)
CHLORIDE: 111 mmol/L (ref 101–111)
CO2: 25 mmol/L (ref 22–32)
Calcium: 8.9 mg/dL (ref 8.9–10.3)
Creatinine, Ser: 0.92 mg/dL (ref 0.61–1.24)
GFR calc Af Amer: >60 mL/min (ref >60)
GFR calc non Af Amer: >60 mL/min (ref >60)
GLUCOSE: 105 mg/dL — AB (ref 65–99)
POTASSIUM: 3.7 mmol/L (ref 3.5–5.1)
Sodium: 140 mmol/L (ref 135–145)

## 2016-09-09 LAB — BRAIN NATRIURETIC PEPTIDE: B Natriuretic Peptide: 38 pg/mL (ref 0.0–100.0)

## 2016-09-09 NOTE — Progress Notes (Signed)
Patient ID: Paul Clayton, male   DOB: 04-May-1946, 70 y.o.   MRN: [id number] PCP: Dr. Brett Albino Cardiology: Dr. Aundra Dubin  70 yo with history of COPD, ETOH and cocaine abuse, HCV, colon cancer, paroxysmal atrial fibrillation, and chronic systolic CHF presents for cardiology followup.  He moved to [place] from [place].  He says that he has had a cardiomyopathy known since 2012. This has been presumed to be due to cocaine and ETOH abuse. He says that he quit ETOH, cocaine, and smoking in 10/15.  He was admitted to [facility] in 12/15 with atrial fibrillation and RVR. He had been out of his Coreg x 2 weeks.  He was diuresed and rate-controlled.  He had had an episode of syncope prior to admission that was thought to be orthostatic given low BP in the setting of atrial fibrillation with RVR.  Echo in 11/15 showed EF 15% with severe MR and TR and moderately decreased RV systolic function.    He had a colonoscopy in 11/15 with a mass found, biopsy showed adenocarcinoma.  Most recent GI evaluation suggests that he will not need colectomy.   LHC/RHC in 1/16 showed no obstructive CAD and mildly elevated filling pressures.  Lasix was increased to 40 mg daily. He was admitted earlier in 1/16 with syncope, possibly defecation syncope.  He wore a Lifevest for a period of time but no longer (ICD not placed given episodes of cocaine use ongoing).  In 4/16, he underwent DCCV to NSR.  He was bradycardic, so Coreg was stopped.    He was admitted in 7/16 with cocaine-related chest pain.    He returns for follow up today. Last visit spiro, amio, and atorvastatin. Overall feeling ok. Completed antibiotic course earlier this month for pneumonia.  Every now and then short of breath. Complaining of cough and intermittent chills.  Lives at rehab center. Taking  All medications. Requires transportation to appointments. Uses food stamps. Estranged from his daughter in town. Last uses cocaine the first of November.   Labs (12/15):  K 4.3, creatinine 0.82, AST normal, ALT 60, HCT 40.9, digoxin 0.7, BNP 4401, HIV negative Labs (10/28/2014): K 4.5 Creatinine 0.90, digoxin 0.9, LFTs normal  Labs (1/16): K 4.3, creatinine 1.11, HCT 41.5, BNP 255, digoxin 0.6 Labs (12/06/2014): K 4.3 Creatinine 1.18  Labs (4/16): AST 53, ALT 60 Labs (5/16): K 4.3, creatinine 1.17, digoxin 0.7, HCT 36.6, BNP 97.5 Labs (7/16): AST 46, ALT 49, TSH normal Labs (9/16): K 4.7, creatinine 0.93, HCT 39.7 Labs (2/17): K 5, creatinine 0.85, HCT 58.8 Labs (06/25/2016): K 4.3 Creatinine 0.89 BNP 65   PMH: 1. COPD: Quit smoking in 10/15 but now back to smoking a few cigarettes/day.  2. Cocaine abuse: Still uses occasionally.   3. ETOH abuse: Quit ETOH in 10/15.  4. Atrial fibrillation: Paroxysmal.  Reports DCCV x 2 in [place].  DCCV 4/16 => bradycardic => cut back Coreg.  5. HCV 6. Colon cancer: Colonoscopy in [place] with removal of polyp showing colonic adenocarcinoma on pathology.  PET scan in 12/15: probably no metastatic disease.   7. H/o syncope: Thought to be orthostatic. Also had vagal-type syncope associated with defecation.  8. Cardiomyopathy: Known since 2012.  Echo (11/15) with EF 15%, diffuse hypokinesis, severe LV dilation, severe MR (likely functional), moderate to severe LAE, dilated RV with moderately decreased systolic function, severe TR.  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  HIV negative.  LHC/RHC (1/16) with nonobstructive CAD, EF 20-25%, mean RA 9, PA 44/26,  mean PCWP 21, CI 2.1.  Echo (7/16) with EF 30-35%, mildly dilated LV, mildly decreased RV systolic function.   9. GERD 10. Sciatica 11. H/o left femur fracture 12. ECHO EF 25-30%.   SH: Prior ETOH abuse. Still uses cocaine but rare now.  Still smokes a few cigarettes/day.  Moved here from [place].  Daughter lives in [place].   FH: No premature CAD.   ROS: All systems reviewed and negative except as per HPI.   Current Outpatient Prescriptions  Medication Sig Dispense  Refill  . acetaminophen (TYLENOL) 325 MG tablet Take 650 mg by mouth every 6 (six) hours as needed for mild pain.    Marland Kitchen albuterol-ipratropium (COMBIVENT) 18-103 MCG/ACT inhaler Inhale 2 puffs into the lungs every 4 (four) hours as needed for wheezing or shortness of breath. 1 Inhaler 0  . amiodarone (PACERONE) 200 MG tablet Take 1 tablet (200 mg total) by mouth daily. 90 tablet 3  . atorvastatin (LIPITOR) 20 MG tablet Take 1 tablet (20 mg total) by mouth daily. 90 tablet 3  . carvedilol (COREG) 6.25 MG tablet Take 1 tablet (6.25 mg total) by mouth 2 (two) times daily with a meal. 60 tablet 3  . lidocaine (LIDODERM) 5 % Place 1 patch onto the skin daily. Remove & Discard patch within 12 hours or as directed by MD 30 patch 0  . losartan (COZAAR) 25 MG tablet Take 1 tablet (25 mg total) by mouth daily. 90 tablet 2  . pregabalin (LYRICA) 50 MG capsule Take 1 capsule (50 mg total) by mouth 2 (two) times daily. 120 capsule 0  . spironolactone (ALDACTONE) 25 MG tablet Take 1 tablet (25 mg total) by mouth daily. 90 tablet 3  . topiramate (TOPAMAX) 25 MG capsule Take 25 mg by mouth 2 (two) times daily.    Alveda Reasons 20 MG TABS tablet TAKE ONE TABLET BY MOUTH DAILY 30 tablet 3   No current facility-administered medications for this encounter.    BP 124/86 (BP Location: Left Arm, Patient Position: Sitting, Cuff Size: Normal)   Pulse 89   Wt 199 lb (90.3 kg)   SpO2 96%   BMI 26.99 kg/m    Filed Weights   09/09/16 1420  Weight: 199 lb (90.3 kg)   General: NAD. Ambulated in the clinic without difficulty.  Neck: No JVD, no thyromegaly or thyroid nodule.  Lungs: LLL RLL coarse.   CV: Nondisplaced PMI.  Heart irregular S1/S2, no S3/S4, 2/6 HSM LLSB and apex.  No peripheral edema.  No carotid bruit.  Difficult to palpate pedal pulses.  Abdomen: Soft, nontender, no hepatosplenomegaly, no distention.  Skin: Intact without lesions or rashes.  Neurologic: Alert and oriented x 3.  Psych: Normal  affect. Extremities: No clubbing or cyanosis.  HEENT: Normal.   Assessment/Plan: 1. Chronic systolic CHF: Cardiomyopathy with LV EF 30-35% and mild RV dysfunction on 7/16 echo. 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  HIV negative.   NYHA class II. Volume status stable. Does not need lasix.  Continue Coreg to 6.25 mg bid. Will not increase with crack cocaine use. (last used 08/20/2016) -  -Continue losartan 25 daily. Next visit consider entresto.  -Continue 25 mg spiro dialy     - Still  using cocaine => referred to HFSW for drug abuse.  Would not consider ICD until substance abuse stops.  2. Atrial fibrillation: s/p DCCV, regular pulse.   -Continue amiodarone 200 daily.   Needs regular eye exam.   - Continue Xarelto.  3. HCV: Follow LFTs closely with amiodarone.  4. COPD: I counseled him to quit smoking.   5. Colon cancer: Per primary care.  6. Syncope: No recurrence.  7. Hyperlipidemia: Continue atorvastatin.  8. LLL Pneumonia-Says he completed antibiotic course earlier this month.   Check BMET, BNP today. I am not going to titrate HF meds because I am not sure he is taking them. I have asked him to return with medications next visit.  Follow up in 4 weeks. Referred to Paramedicine.   Amy Clegg NP-C  09/09/2016

## 2016-09-09 NOTE — Progress Notes (Signed)
124/86 

## 2016-09-09 NOTE — Patient Instructions (Signed)
Routine lab work today. Will notify you of abnormal results, otherwise no news is good news!  Follow up 1 month with Amy Clegg NP-C. Bring all of your medications with you to this visit.  Do the following things EVERYDAY: 1) Weigh yourself in the morning before breakfast. Write it down and keep it in a log. 2) Take your medicines as prescribed 3) Eat low salt foods-Limit salt (sodium) to 2000 mg per day.  4) Stay as active as you can everyday 5) Limit all fluids for the day to less than 2 liters

## 2016-10-07 ENCOUNTER — Encounter (HOSPITAL_COMMUNITY): Payer: Medicare Other

## 2016-10-14 ENCOUNTER — Other Ambulatory Visit (HOSPITAL_COMMUNITY): Payer: Self-pay | Admitting: Cardiology

## 2016-11-01 ENCOUNTER — Emergency Department (HOSPITAL_COMMUNITY)
Admission: EM | Admit: 2016-11-01 | Discharge: 2016-11-01 | Disposition: A | Discharge status: Home / Self Care | Payer: Medicare Other | Attending: Emergency Medicine | Admitting: Emergency Medicine

## 2016-11-01 ENCOUNTER — Encounter (HOSPITAL_COMMUNITY): Payer: Self-pay | Admitting: Emergency Medicine

## 2016-11-01 DIAGNOSIS — M5442 Lumbago with sciatica, left side: Secondary | ICD-10-CM | POA: Insufficient documentation

## 2016-11-01 DIAGNOSIS — Z7901 Long term (current) use of anticoagulants: Secondary | ICD-10-CM | POA: Insufficient documentation

## 2016-11-01 DIAGNOSIS — F17210 Nicotine dependence, cigarettes, uncomplicated: Secondary | ICD-10-CM | POA: Insufficient documentation

## 2016-11-01 DIAGNOSIS — M545 Low back pain: Secondary | ICD-10-CM | POA: Diagnosis present

## 2016-11-01 DIAGNOSIS — I110 Hypertensive heart disease with heart failure: Secondary | ICD-10-CM | POA: Insufficient documentation

## 2016-11-01 DIAGNOSIS — I2510 Atherosclerotic heart disease of native coronary artery without angina pectoris: Secondary | ICD-10-CM | POA: Insufficient documentation

## 2016-11-01 DIAGNOSIS — Z79899 Other long term (current) drug therapy: Secondary | ICD-10-CM | POA: Diagnosis not present

## 2016-11-01 DIAGNOSIS — Z85038 Personal history of other malignant neoplasm of large intestine: Secondary | ICD-10-CM | POA: Insufficient documentation

## 2016-11-01 DIAGNOSIS — M549 Dorsalgia, unspecified: Secondary | ICD-10-CM | POA: Diagnosis not present

## 2016-11-01 DIAGNOSIS — J449 Chronic obstructive pulmonary disease, unspecified: Secondary | ICD-10-CM | POA: Diagnosis not present

## 2016-11-01 DIAGNOSIS — M25559 Pain in unspecified hip: Secondary | ICD-10-CM | POA: Diagnosis not present

## 2016-11-01 DIAGNOSIS — I5022 Chronic systolic (congestive) heart failure: Secondary | ICD-10-CM | POA: Insufficient documentation

## 2016-11-01 MED ORDER — PREGABALIN 50 MG PO CAPS
50.0000 mg | ORAL_CAPSULE | Freq: Once | ORAL | Status: AC
  Administered 2016-11-01: 50 mg via ORAL
  Filled 2016-11-01: qty 1

## 2016-11-01 MED ORDER — PREDNISONE 20 MG PO TABS: 40.0000 mg | ORAL_TABLET | Freq: Every day | ORAL | 0 refills | Status: DC

## 2016-11-01 MED ORDER — PREDNISONE 20 MG PO TABS
60.0000 mg | ORAL_TABLET | ORAL | Status: AC
  Administered 2016-11-01: 60 mg via ORAL
  Filled 2016-11-01: qty 3

## 2016-11-01 MED ORDER — TRAMADOL HCL 50 MG PO TABS
50.0000 mg | ORAL_TABLET | Freq: Once | ORAL | Status: AC
  Administered 2016-11-01: 50 mg via ORAL
  Filled 2016-11-01: qty 1

## 2016-11-01 MED ORDER — PREGABALIN 50 MG PO CAPS: 50.0000 mg | ORAL_CAPSULE | Freq: Two times a day (BID) | ORAL | 0 refills | Status: DC

## 2016-11-01 NOTE — ED Provider Notes (Signed)
[facility] DEPT Provider Note   CSN: [id number] Arrival date & time: 11/01/16  0253     History   Chief Complaint Chief Complaint  Patient presents with  . Back Pain    HPI Paul Clayton is a 71 y.o. male.  HPI  Patient presents with concern of left low back pain, which began seemingly overnight. Patient acknowledges a history of sciatica, states that he typically has exacerbations when the weather changes. Since onset he has taken no new medication for pain relief, and he is unclear of what he takes chronically for symptom control. He denies any changes including abdominal pain, loss of sensation in the leg, falling.  Patient has previously been seen by primary care for this, has not seen a specialist, nor any pain management physician. He denies other complaints, states that he is generally well, though he acknowledges multiple other chronic medical problems.   Past Medical History:  Diagnosis Date  . Adenocarcinoma of colon ([place])    STAGE 1  . Anxiety   . Arthritis    "left hip" (04/19/2015)  . CAD (coronary artery disease)    a. LHC 1/16:  LM 20%, oLAD 30%, oLCx 30%, pRCA 30%  . Chronic lower back pain   . Chronic systolic CHF (congestive heart failure) (HCC)    a. EF previously 15%; b. Echo 7/16:  EF 30-35%, diff HK, gr 1 DD, mild MR, mild LAE, mild reduced RVSF  . COPD (chronic obstructive pulmonary disease) ([place])   . Depression   . GERD (gastroesophageal reflux disease)   . Hepatitis C   . Hypertension   . Migraine    "last one was back in the 1980's" (04/19/2015)  . NICM (nonischemic cardiomyopathy) ([place])    no obs CAD on LHC in 1/16 - ? HTN or substance abuse  . Persistent atrial fibrillation (H[facility])    a. s/p DCCV; b. Amiodarone  . Substance abuse    ETOH; crack cocaine  . Tobacco abuse     Patient Active Problem List   Diagnosis Date Noted  . Health care maintenance 06/20/2016  . Urinary incontinence 05/03/2015  . LBP radiating to left leg  03/12/2015  . Nonischemic cardiomyopathy ([place]) 03/05/2015  . Hx of substance abuse 10/30/2014  . Faintness   . Homeless single person   . Atrial fibrillation with RVR ([place]) 10/13/2014  . High risk social situation 09/30/2014  . Colon cancer ([place]) 09/26/2014  . HFrEF (heart failure with reduced ejection fraction) ([place]) 09/12/2014  . A-fib ([place]) 09/12/2014  . COPD (chronic obstructive pulmonary disease) ([place]) 09/12/2014    Past Surgical History:  Procedure Laterality Date  . CARDIAC CATHETERIZATION  10/23/2014  . CARDIOVERSION     "I've had 2 in all" (04/19/2015)  . CARDIOVERSION N/A 01/30/2015   Procedure: CARDIOVERSION;  Surgeon: Larey Dresser, MD;  Location: [facility];  Service: Cardiovascular;  Laterality: N/A;  . COLONOSCOPY    . COLONOSCOPY WITH PROPOFOL N/A 11/30/2014   Procedure: COLONOSCOPY WITH PROPOFOL;  Surgeon: Milus Banister, MD;  Location: [facility] ENDOSCOPY;  Service: Endoscopy;  Laterality: N/A;  . LEFT AND RIGHT HEART CATHETERIZATION WITH CORONARY ANGIOGRAM N/A 10/23/2014   Procedure: LEFT AND RIGHT HEART CATHETERIZATION WITH CORONARY ANGIOGRAM;  Surgeon: Larey Dresser, MD;  Location: [facility] CATH LAB;  Service: Cardiovascular;  Laterality: N/A;       Home Medications    Prior to Admission medications   Medication Sig Start Date End Date Taking? Authorizing Provider  acetaminophen (TYLENOL)  325 MG tablet Take 650 mg by mouth every 6 (six) hours as needed for mild pain.    Historical Provider, MD  albuterol-ipratropium (COMBIVENT) 18-103 MCG/ACT inhaler Inhale 2 puffs into the lungs every 4 (four) hours as needed for wheezing or shortness of breath. 08/25/16   [place], PA-C  amiodarone (PACERONE) 200 MG tablet Take 1 tablet (200 mg total) by mouth daily. 08/26/16   Amy D Clegg, NP  atorvastatin (LIPITOR) 20 MG tablet Take 1 tablet (20 mg total) by mouth daily. 08/26/16   Amy D Clegg, NP  carvedilol (COREG) 6.25 MG tablet TAKE 1 TABLET (6.25 MG TOTAL) BY MOUTH  TWO TIMES DAILY WITH A MEAL. 10/14/16   Larey Dresser, MD  lidocaine (LIDODERM) 5 % Place 1 patch onto the skin daily. Remove & Discard patch within 12 hours or as directed by MD 06/20/16   Sela Hua, MD  losartan (COZAAR) 25 MG tablet Take 1 tablet (25 mg total) by mouth daily. 08/23/15   Larey Dresser, MD  predniSONE (DELTASONE) 20 MG tablet Take 2 tablets (40 mg total) by mouth daily with breakfast. For the next four days 11/01/16   Carmin Muskrat, MD  pregabalin (LYRICA) 50 MG capsule Take 1 capsule (50 mg total) by mouth 2 (two) times daily. 11/01/16   Carmin Muskrat, MD  spironolactone (ALDACTONE) 25 MG tablet Take 1 tablet (25 mg total) by mouth daily. 08/26/16   Amy D Ninfa Meeker, NP  topiramate (TOPAMAX) 25 MG capsule Take 25 mg by mouth 2 (two) times daily.    Historical Provider, MD  XARELTO 20 MG TABS tablet TAKE ONE TABLET BY MOUTH DAILY 07/28/16   Larey Dresser, MD    Family History Family History  Problem Relation Age of Onset  . Hypertension Mother     Social History Social History  Substance Use Topics  . Smoking status: Current Every Day Smoker    Packs/day: 0.10    Years: 52.00    Types: Cigarettes    Last attempt to quit: 09/01/2014  . Smokeless tobacco: Never Used     Comment: 06/20/16-started back smoking; pack lasts 1 mo; wants to quit again  . Alcohol use 0.0 oz/week     Comment: 04/19/2015 "I might drink a 40oz beer/month"     Allergies   Patient has no known allergies.   Review of Systems Review of Systems  Constitutional:       Per HPI, otherwise negative  HENT:       Per HPI, otherwise negative  Respiratory:       Per HPI, otherwise negative  Cardiovascular:       Per HPI, otherwise negative  Gastrointestinal: Negative for vomiting.  Endocrine:       Negative aside from HPI  Genitourinary:       Neg aside from HPI   Musculoskeletal:       Per HPI, otherwise negative  Skin: Negative.   Neurological: Negative for syncope and weakness.      Physical Exam Updated Vital Signs BP 135/88 (BP Location: Left Arm)   Pulse 81   Temp 98.4 F (36.9 C) (Oral)   Resp 18   SpO2 100%   Physical Exam  Constitutional: He is oriented to person, place, and time. He appears well-developed. No distress.  HENT:  Head: Normocephalic and atraumatic.  Eyes: Conjunctivae and EOM are normal.  Cardiovascular: Normal rate and regular rhythm.   Pulmonary/Chest: Effort normal. No stridor. No respiratory distress.  Abdominal: He exhibits no distension.  Musculoskeletal: He exhibits no edema.  Mild tenderness to palpation left lower back, pain in this area with hip flexion, greater on the left, but no loss of function, nor strength in either leg.  Neurological: He is alert and oriented to person, place, and time.  Skin: Skin is warm and dry.  Psychiatric: He has a normal mood and affect.  Nursing note and vitals reviewed.    ED Treatments / Results   Procedures Procedures (including critical care time)  Medications Ordered in ED Medications  pregabalin (LYRICA) capsule 50 mg (not administered)  traMADol (ULTRAM) tablet 50 mg (not administered)  predniSONE (DELTASONE) tablet 60 mg (not administered)     Initial Impression / Assessment and Plan / ED Course  I have reviewed the triage vital signs and the nursing notes.  Pertinent labs & imaging results that were available during my care of the patient were reviewed by me and considered in my medical decision making (see chart for details).  Clinical Course     [place] with a known history of sciatica presents with ongoing left low back pain, left leg pain. And no red flags suggesting progressive neurologic dysfunction.  Patient has a primary care physician, was started on a short course of medication, anti-inflammatories, discharged in stable condition.  Final Clinical Impressions(s) / ED Diagnoses   Final diagnoses:  Left-sided low back pain with left-sided sciatica,  unspecified chronicity    New Prescriptions New Prescriptions   PREDNISONE (DELTASONE) 20 MG TABLET    Take 2 tablets (40 mg total) by mouth daily with breakfast. For the next four days     Carmin Muskrat, MD 11/01/16 [phone]

## 2016-11-01 NOTE — ED Notes (Signed)
Patient

## 2016-11-01 NOTE — ED Notes (Signed)
Pt verbalizes understanding of discharge instructions. Wheeled to waiting room at discharge. NAD.

## 2016-11-01 NOTE — ED Triage Notes (Signed)
Per EMS:  Pt presents to ED for assessment of chronic back pain with sciatica.  Pt sts the pain increased significantly this evening and so he called 911.  Patient denies any recent heavy lifting or injuries.  Pt sts pain radiates up to his neck and down his left leg.

## 2016-11-04 ENCOUNTER — Other Ambulatory Visit: Payer: Self-pay | Admitting: Cardiology

## 2016-12-03 ENCOUNTER — Other Ambulatory Visit: Payer: Self-pay | Admitting: Cardiology

## 2016-12-04 NOTE — Telephone Encounter (Signed)
Pt last seen in CHF clinic on 09/09/16, last Creat 0.92 on 09/09/16, weight-90.3kg, Age 71, CrCl 96.79.  Pt is on appropriate dosage of Xarelto 20mg  QD, will refill rx x 6 months.

## 2016-12-15 ENCOUNTER — Encounter (HOSPITAL_COMMUNITY): Payer: Self-pay | Admitting: *Deleted

## 2016-12-15 ENCOUNTER — Emergency Department (HOSPITAL_COMMUNITY)
Admission: EM | Admit: 2016-12-15 | Discharge: 2016-12-15 | Disposition: A | Discharge status: Home / Self Care | Payer: Medicare Other | Attending: Emergency Medicine | Admitting: Emergency Medicine

## 2016-12-15 DIAGNOSIS — J449 Chronic obstructive pulmonary disease, unspecified: Secondary | ICD-10-CM | POA: Insufficient documentation

## 2016-12-15 DIAGNOSIS — I2510 Atherosclerotic heart disease of native coronary artery without angina pectoris: Secondary | ICD-10-CM | POA: Diagnosis not present

## 2016-12-15 DIAGNOSIS — I110 Hypertensive heart disease with heart failure: Secondary | ICD-10-CM | POA: Diagnosis not present

## 2016-12-15 DIAGNOSIS — Z85038 Personal history of other malignant neoplasm of large intestine: Secondary | ICD-10-CM | POA: Insufficient documentation

## 2016-12-15 DIAGNOSIS — M546 Pain in thoracic spine: Secondary | ICD-10-CM | POA: Diagnosis not present

## 2016-12-15 DIAGNOSIS — F17210 Nicotine dependence, cigarettes, uncomplicated: Secondary | ICD-10-CM | POA: Insufficient documentation

## 2016-12-15 DIAGNOSIS — M5442 Lumbago with sciatica, left side: Secondary | ICD-10-CM | POA: Insufficient documentation

## 2016-12-15 DIAGNOSIS — Z7901 Long term (current) use of anticoagulants: Secondary | ICD-10-CM | POA: Insufficient documentation

## 2016-12-15 DIAGNOSIS — G8929 Other chronic pain: Secondary | ICD-10-CM | POA: Diagnosis not present

## 2016-12-15 DIAGNOSIS — I5022 Chronic systolic (congestive) heart failure: Secondary | ICD-10-CM | POA: Diagnosis not present

## 2016-12-15 DIAGNOSIS — M545 Low back pain: Secondary | ICD-10-CM | POA: Diagnosis present

## 2016-12-15 MED ORDER — METHOCARBAMOL 500 MG PO TABS: 500.0000 mg | ORAL_TABLET | Freq: Two times a day (BID) | ORAL | 0 refills | Status: DC

## 2016-12-15 MED ORDER — PREDNISONE 20 MG PO TABS: 40.0000 mg | ORAL_TABLET | Freq: Every day | ORAL | 0 refills | Status: DC

## 2016-12-15 MED ORDER — PREDNISONE 20 MG PO TABS
60.0000 mg | ORAL_TABLET | Freq: Once | ORAL | Status: AC
  Administered 2016-12-15: 60 mg via ORAL
  Filled 2016-12-15: qty 3

## 2016-12-15 NOTE — ED Triage Notes (Signed)
Per EMS, pt complains of left lower back and leg pain. Pt has hx of sciatica and states pain feels similar. Pt is out of gabapentin and percocet.   BP 132/80 HR 83 RR 16

## 2016-12-15 NOTE — ED Provider Notes (Signed)
[facility] DEPT Provider Note   CSN: [id number] Arrival date & time: 12/15/16  1543   By signing my name below, I, Collene Leyden, attest that this documentation has been prepared under the direction and in the presence of Harlene Ramus, [place].  Electronically Signed: Collene Leyden, Scribe. 12/15/16. 4:52 PM.   History   Chief Complaint Chief Complaint  Patient presents with  . Back Pain    HPI Comments: Paul Clayton is a 71 y.o. male with a hx of chronic lower back pain and polysubstance abuse, who presents to the Emergency Department complaining of sudden-onset, constant left lower back pain that worsened this morning. Patient states his symptoms are similar to prior sciatica episodes/flares. Patient reports associated radiation down the left leg pain. Patient states he has been unable to fill his prescription of percocet due to needing a refill. Patient has not had his medications for a few months now. Patient has an appointment with his PCP on 01/01/17. Patient reports a history of IV drug abuse back in the 90's but denies any recent use over the past 20 years. Patient denies any heavy lifting, exercising, fever, chest pain, abdominal pain, loss of bowel control, groin numbness, numbness/tingling in the leg, weakness in the leg, recent acupuncture, or any recent chiropractic issue.   The history is provided by the patient. No language interpreter was used.    Past Medical History:  Diagnosis Date  . 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 Anxiety   . Arthritis    "left hip" (04/19/2015)  . CAD (coronary artery disease)    a. LHC 1/16:  LM 20%, oLAD 30%, oLCx 30%, pRCA 30%  . Chronic lower back pain   . Chronic systolic CHF (congestive heart failure) (HCC)    a. EF previously 15%; b. Echo 7/16:  EF 30-35%, diff HK, gr 1 DD, mild MR, mild LAE, mild reduced RVSF  . COPD (chronic obstructive pulmonary disease) ([place])   . Depression   . GERD (gastroesophageal reflux disease)     . Hepatitis C   . Hypertension   . Migraine    "last one was back in the 1980's" (04/19/2015)  . NICM (nonischemic cardiomyopathy) ([place])    no obs CAD on LHC in 1/16 - ? HTN or substance abuse  . Persistent atrial fibrillation (HCC)    a. s/p DCCV; b. Amiodarone  . Substance abuse    ETOH; crack cocaine  . Tobacco abuse     Patient Active Problem List   Diagnosis Date Noted  . Health care maintenance 06/20/2016  . Urinary incontinence 05/03/2015  . LBP radiating to left leg 03/12/2015  . Nonischemic cardiomyopathy ([place]) 03/05/2015  . Hx of substance abuse 10/30/2014  . Faintness   . Homeless single person   . Atrial fibrillation with RVR ([place]) 10/13/2014  . High risk social situation 09/30/2014  . Colon cancer ([place]) 09/26/2014  . HFrEF (heart failure with reduced ejection fraction) ([place]) 09/12/2014  . A-fib ([place]) 09/12/2014  . COPD (chronic obstructive pulmonary disease) ([place]) 09/12/2014    Past Surgical History:  Procedure Laterality Date  . CARDIAC CATHETERIZATION  10/23/2014  . CARDIOVERSION     "I've had 2 in all" (04/19/2015)  . CARDIOVERSION N/A 01/30/2015   Procedure: CARDIOVERSION;  Surgeon: Larey Dresser, MD;  Location: [facility];  Service: Cardiovascular;  Laterality: N/A;  . COLONOSCOPY    . COLONOSCOPY WITH PROPOFOL N/A 11/30/2014   Procedure: COLONOSCOPY WITH PROPOFOL;  Surgeon:  Milus Banister, MD;  Location: [facility];  Service: Endoscopy;  Laterality: N/A;  . LEFT AND RIGHT HEART CATHETERIZATION WITH CORONARY ANGIOGRAM N/A 10/23/2014   Procedure: LEFT AND RIGHT HEART CATHETERIZATION WITH CORONARY ANGIOGRAM;  Surgeon: Larey Dresser, MD;  Location: [facility] CATH LAB;  Service: Cardiovascular;  Laterality: N/A;       Home Medications    Prior to Admission medications   Medication Sig Start Date End Date Taking? Authorizing Provider  acetaminophen (TYLENOL) 325 MG tablet Take 650 mg by mouth every 6 (six) hours as needed for mild pain.    Historical  Provider, MD  albuterol-ipratropium (COMBIVENT) 18-103 MCG/ACT inhaler Inhale 2 puffs into the lungs every 4 (four) hours as needed for wheezing or shortness of breath. 08/25/16   Mercedes Street, PA-C  amiodarone (PACERONE) 200 MG tablet Take 1 tablet (200 mg total) by mouth daily. 08/26/16   Amy D Clegg, NP  atorvastatin (LIPITOR) 20 MG tablet Take 1 tablet (20 mg total) by mouth daily. 08/26/16   Amy D Clegg, NP  carvedilol (COREG) 6.25 MG tablet TAKE 1 TABLET (6.25 MG TOTAL) BY MOUTH TWO TIMES DAILY WITH A MEAL. 10/14/16   Larey Dresser, MD  lidocaine (LIDODERM) 5 % Place 1 patch onto the skin daily. Remove & Discard patch within 12 hours or as directed by MD 06/20/16   Sela Hua, MD  losartan (COZAAR) 25 MG tablet TAKE 1 TABLET (25 MG TOTAL) BY MOUTH DAILY. 11/04/16   Larey Dresser, MD  methocarbamol (ROBAXIN) 500 MG tablet Take 1 tablet (500 mg total) by mouth 2 (two) times daily. 12/15/16   Nona Dell, PA-C  predniSONE (DELTASONE) 20 MG tablet Take 2 tablets (40 mg total) by mouth daily. 12/15/16   Nona Dell, PA-C  pregabalin (LYRICA) 50 MG capsule Take 1 capsule (50 mg total) by mouth 2 (two) times daily. 11/01/16   Carmin Muskrat, MD  rivaroxaban (XARELTO) 20 MG TABS tablet Take 1 tablet (20 mg total) by mouth daily. 12/04/16   Larey Dresser, MD  spironolactone (ALDACTONE) 25 MG tablet Take 1 tablet (25 mg total) by mouth daily. 08/26/16   Amy D Ninfa Meeker, NP  topiramate (TOPAMAX) 25 MG capsule Take 25 mg by mouth 2 (two) times daily.    Historical Provider, MD    Family History Family History  Problem Relation Age of Onset  . Hypertension Mother     Social History Social History  Substance Use Topics  . Smoking status: Current Every Day Smoker    Packs/day: 0.10    Years: 52.00    Types: Cigarettes    Last attempt to quit: 09/01/2014  . Smokeless tobacco: Never Used     Comment: 06/20/16-started back smoking; pack lasts 1 mo; wants to quit again  .  Alcohol use 0.0 oz/week     Comment: 04/19/2015 "I might drink a 40oz beer/month"     Allergies   Patient has no known allergies.   Review of Systems Review of Systems  Musculoskeletal: Positive for arthralgias (left leg) and back pain (left lower).     Physical Exam Updated Vital Signs BP 114/67 (BP Location: Left Arm)   Pulse 77   Temp 98.3 F (36.8 C) (Oral)   Resp 16   Ht 6' (1.829 m)   Wt 82.6 kg   SpO2 99%   BMI 24.68 kg/m   Physical Exam  Constitutional: He is oriented to person, place, and time. He appears well-developed and  well-nourished.  HENT:  Head: Normocephalic and atraumatic.  Eyes: Conjunctivae and EOM are normal. Right eye exhibits no discharge. Left eye exhibits no discharge. No scleral icterus.  Neck: Normal range of motion. Neck supple.  Cardiovascular: Normal rate, regular rhythm, normal heart sounds and intact distal pulses.   Pulmonary/Chest: Effort normal and breath sounds normal. No respiratory distress. He has no wheezes. He has no rales. He exhibits no tenderness.  Abdominal: Soft. Bowel sounds are normal. He exhibits no distension and no mass. There is no tenderness. There is no rebound and no guarding.  Musculoskeletal: Normal range of motion. He exhibits tenderness. He exhibits no edema or deformity.  No midline C, T, or L tenderness. Mild tenderness over the left lumbar paraspinal muscles and left buttocks. Positive left straight left raise. Full range of motion of neck and back. Full range of motion of bilateral upper and lower extremities, with 5/5 strength. Sensation intact. 2+ radial and PT pulses. Cap refill <2 seconds. Patient able to stand and ambulate without assistance.    Neurological: He is alert and oriented to person, place, and time. He has normal strength. He displays normal reflexes. No sensory deficit. Gait normal.  Skin: Skin is warm and dry.  Nursing note and vitals reviewed.    ED Treatments / Results  DIAGNOSTIC  STUDIES: Oxygen Saturation is 99% on RA, normal by my interpretation.    COORDINATION OF CARE: 4:51 PM Discussed treatment plan with pt at bedside and pt agreed to plan.    Labs (all labs ordered are listed, but only abnormal results are displayed) Labs Reviewed - No data to display  EKG  EKG Interpretation None       Radiology No results found.  Procedures Procedures (including critical care time)  Medications Ordered in ED Medications  predniSONE (DELTASONE) tablet 60 mg (not administered)     Initial Impression / Assessment and Plan / ED Course  I have reviewed the triage vital signs and the nursing notes.  Pertinent labs & imaging results that were available during my care of the patient were reviewed by me and considered in my medical decision making (see chart for details).     Normal neurological exam, no evidence of urinary incontinence or retention, pain is consistently reproducible. There is no evidence of AAA or concern for dissection at this time.   Patient can walk but states is painful. Reports hx of sciatica and similar pain in the past. No loss of bowel or bladder control.  No concern for cauda equina.  No fever, night sweats, weight loss, h/o cancer, IVDU.  Pain treated here in the department with adequate improvement, able to stand and ambulate. RICE protocol, muscle relaxant and steroids indicated and discussed with patient. Advised patient to follow up with his primary care provider regarding further management of his chronic back pain and refilling his prescription of hydrocodone. Patient is currently on Xarelto for A. fib, will not initiate additional use of NSAIDs for pain control at thist ime. I have also discussed reasons to return immediately to the ER.  Patient expresses understanding and agrees with plan.    Final Clinical Impressions(s) / ED Diagnoses   Final diagnoses:  Chronic left-sided low back pain with left-sided sciatica    New  Prescriptions New Prescriptions   METHOCARBAMOL (ROBAXIN) 500 MG TABLET    Take 1 tablet (500 mg total) by mouth 2 (two) times daily.   PREDNISONE (DELTASONE) 20 MG TABLET    Take 2 tablets (  40 mg total) by mouth daily.   I personally performed the services described in this documentation, which was scribed in my presence. The recorded information has been reviewed and is accurate.     Chesley Noon [place], [place] [id number]    Delora Fuel, MD [id number]

## 2016-12-15 NOTE — Discharge Instructions (Signed)
Take your medications as prescribed. I also recommend applying ice to affected area for 15-20 minutes 3-4 times daily for symptomatic relief. Refrain from doing any heavy lifting or repetitive movement that exacerbates her symptoms for the next few days. I recommend following up with your primary care provider for further management of your chronic back pain. Please return to the Emergency Department if symptoms worsen or new onset of fever, numbness, tingling, groin anesthesia, loss of bowel or bladder, weakness.

## 2017-01-04 ENCOUNTER — Encounter (HOSPITAL_COMMUNITY): Payer: Self-pay

## 2017-01-04 DIAGNOSIS — B1920 Unspecified viral hepatitis C without hepatic coma: Secondary | ICD-10-CM | POA: Insufficient documentation

## 2017-01-04 DIAGNOSIS — Z7952 Long term (current) use of systemic steroids: Secondary | ICD-10-CM | POA: Insufficient documentation

## 2017-01-04 DIAGNOSIS — R079 Chest pain, unspecified: Secondary | ICD-10-CM | POA: Diagnosis not present

## 2017-01-04 DIAGNOSIS — I481 Persistent atrial fibrillation: Secondary | ICD-10-CM | POA: Diagnosis not present

## 2017-01-04 DIAGNOSIS — Z8249 Family history of ischemic heart disease and other diseases of the circulatory system: Secondary | ICD-10-CM | POA: Insufficient documentation

## 2017-01-04 DIAGNOSIS — I110 Hypertensive heart disease with heart failure: Secondary | ICD-10-CM | POA: Insufficient documentation

## 2017-01-04 DIAGNOSIS — Z79899 Other long term (current) drug therapy: Secondary | ICD-10-CM | POA: Diagnosis not present

## 2017-01-04 DIAGNOSIS — I5022 Chronic systolic (congestive) heart failure: Secondary | ICD-10-CM | POA: Diagnosis not present

## 2017-01-04 DIAGNOSIS — J449 Chronic obstructive pulmonary disease, unspecified: Secondary | ICD-10-CM | POA: Diagnosis not present

## 2017-01-04 DIAGNOSIS — F17210 Nicotine dependence, cigarettes, uncomplicated: Secondary | ICD-10-CM | POA: Insufficient documentation

## 2017-01-04 DIAGNOSIS — M5442 Lumbago with sciatica, left side: Secondary | ICD-10-CM | POA: Insufficient documentation

## 2017-01-04 DIAGNOSIS — Z85038 Personal history of other malignant neoplasm of large intestine: Secondary | ICD-10-CM | POA: Insufficient documentation

## 2017-01-04 DIAGNOSIS — E785 Hyperlipidemia, unspecified: Secondary | ICD-10-CM | POA: Diagnosis not present

## 2017-01-04 DIAGNOSIS — R55 Syncope and collapse: Secondary | ICD-10-CM | POA: Diagnosis not present

## 2017-01-04 DIAGNOSIS — F1410 Cocaine abuse, uncomplicated: Secondary | ICD-10-CM | POA: Diagnosis not present

## 2017-01-04 DIAGNOSIS — F329 Major depressive disorder, single episode, unspecified: Secondary | ICD-10-CM | POA: Insufficient documentation

## 2017-01-04 DIAGNOSIS — I429 Cardiomyopathy, unspecified: Secondary | ICD-10-CM | POA: Diagnosis not present

## 2017-01-04 DIAGNOSIS — R32 Unspecified urinary incontinence: Secondary | ICD-10-CM | POA: Diagnosis not present

## 2017-01-04 DIAGNOSIS — F419 Anxiety disorder, unspecified: Secondary | ICD-10-CM | POA: Diagnosis not present

## 2017-01-04 DIAGNOSIS — K219 Gastro-esophageal reflux disease without esophagitis: Secondary | ICD-10-CM | POA: Insufficient documentation

## 2017-01-04 DIAGNOSIS — Z590 Homelessness: Secondary | ICD-10-CM | POA: Insufficient documentation

## 2017-01-04 DIAGNOSIS — I2510 Atherosclerotic heart disease of native coronary artery without angina pectoris: Secondary | ICD-10-CM | POA: Diagnosis not present

## 2017-01-04 DIAGNOSIS — Z9889 Other specified postprocedural states: Secondary | ICD-10-CM | POA: Diagnosis not present

## 2017-01-04 DIAGNOSIS — Z7901 Long term (current) use of anticoagulants: Secondary | ICD-10-CM | POA: Diagnosis not present

## 2017-01-04 DIAGNOSIS — I480 Paroxysmal atrial fibrillation: Secondary | ICD-10-CM | POA: Insufficient documentation

## 2017-01-04 DIAGNOSIS — I4581 Long QT syndrome: Secondary | ICD-10-CM | POA: Diagnosis not present

## 2017-01-04 LAB — BASIC METABOLIC PANEL
ANION GAP: 12 (ref 5–15)
BUN: 14 mg/dL (ref 6–20)
CHLORIDE: 104 mmol/L (ref 101–111)
CO2: 23 mmol/L (ref 22–32)
Calcium: 9.4 mg/dL (ref 8.9–10.3)
Creatinine, Ser: 0.89 mg/dL (ref 0.61–1.24)
GFR calc non Af Amer: >60 mL/min (ref >60)
GLUCOSE: 155 mg/dL — AB (ref 65–99)
POTASSIUM: 3.3 mmol/L — AB (ref 3.5–5.1)
Sodium: 139 mmol/L (ref 135–145)

## 2017-01-04 LAB — CBC
HEMATOCRIT: 43.1 % (ref 39.0–52.0)
HEMOGLOBIN: 14.8 g/dL (ref 13.0–17.0)
MCH: 33.3 pg (ref 26.0–34.0)
MCHC: 34.3 g/dL (ref 30.0–36.0)
MCV: 96.9 fL (ref 78.0–100.0)
Platelets: 198 10*3/uL (ref 150–400)
RBC: 4.45 MIL/uL (ref 4.22–5.81)
RDW: 13.2 % (ref 11.5–15.5)
WBC: 9.6 10*3/uL (ref 4.0–10.5)

## 2017-01-04 NOTE — ED Triage Notes (Signed)
Pt states had syncopal episode. Pt states woke up on ground, does not remember falling. Pt states sitting on porch when event happens. Pt denies any head/neck/back injury. Pt a/o x 4 at triage. Pt states on xeralto.

## 2017-01-05 ENCOUNTER — Observation Stay (HOSPITAL_COMMUNITY)
Admission: EM | Admit: 2017-01-05 | Discharge: 2017-01-07 | Disposition: A | Discharge status: Home / Self Care | Payer: Medicare Other | Attending: Family Medicine | Admitting: Family Medicine

## 2017-01-05 ENCOUNTER — Emergency Department (HOSPITAL_COMMUNITY): Payer: Medicare Other

## 2017-01-05 DIAGNOSIS — I480 Paroxysmal atrial fibrillation: Secondary | ICD-10-CM | POA: Diagnosis not present

## 2017-01-05 DIAGNOSIS — I5022 Chronic systolic (congestive) heart failure: Secondary | ICD-10-CM

## 2017-01-05 DIAGNOSIS — I428 Other cardiomyopathies: Secondary | ICD-10-CM | POA: Diagnosis not present

## 2017-01-05 DIAGNOSIS — M5440 Lumbago with sciatica, unspecified side: Secondary | ICD-10-CM

## 2017-01-05 DIAGNOSIS — M5432 Sciatica, left side: Secondary | ICD-10-CM | POA: Diagnosis not present

## 2017-01-05 DIAGNOSIS — R55 Syncope and collapse: Secondary | ICD-10-CM | POA: Diagnosis not present

## 2017-01-05 DIAGNOSIS — F1010 Alcohol abuse, uncomplicated: Secondary | ICD-10-CM

## 2017-01-05 DIAGNOSIS — F1910 Other psychoactive substance abuse, uncomplicated: Secondary | ICD-10-CM

## 2017-01-05 DIAGNOSIS — G8929 Other chronic pain: Secondary | ICD-10-CM | POA: Insufficient documentation

## 2017-01-05 DIAGNOSIS — R079 Chest pain, unspecified: Secondary | ICD-10-CM | POA: Diagnosis not present

## 2017-01-05 LAB — CBG MONITORING, ED: Glucose-Capillary: 72 mg/dL (ref 65–99)

## 2017-01-05 LAB — RAPID URINE DRUG SCREEN, HOSP PERFORMED
AMPHETAMINES: NOT DETECTED
Barbiturates: NOT DETECTED
Benzodiazepines: NOT DETECTED
Cocaine: POSITIVE — AB
OPIATES: NOT DETECTED
Tetrahydrocannabinol: POSITIVE — AB

## 2017-01-05 LAB — URINALYSIS, ROUTINE W REFLEX MICROSCOPIC
Bacteria, UA: NONE SEEN
Bilirubin Urine: NEGATIVE
GLUCOSE, UA: NEGATIVE mg/dL
Ketones, ur: NEGATIVE mg/dL
Nitrite: NEGATIVE
PROTEIN: NEGATIVE mg/dL
Specific Gravity, Urine: 1.023 (ref 1.005–1.030)
pH: 5 (ref 5.0–8.0)

## 2017-01-05 LAB — BRAIN NATRIURETIC PEPTIDE: B Natriuretic Peptide: 99.7 pg/mL (ref 0.0–100.0)

## 2017-01-05 LAB — TROPONIN I: Troponin I: <0.03 ng/mL (ref <0.03)

## 2017-01-05 LAB — CK: CK TOTAL: 72 U/L (ref 49–397)

## 2017-01-05 MED ORDER — FOLIC ACID 1 MG PO TABS
1.0000 mg | ORAL_TABLET | Freq: Every day | ORAL | Status: DC
  Administered 2017-01-05 – 2017-01-07 (×3): 1 mg via ORAL
  Filled 2017-01-05 (×3): qty 1

## 2017-01-05 MED ORDER — AMIODARONE HCL 200 MG PO TABS
200.0000 mg | ORAL_TABLET | Freq: Every day | ORAL | Status: DC
  Administered 2017-01-06 – 2017-01-07 (×2): 200 mg via ORAL
  Filled 2017-01-05 (×2): qty 1

## 2017-01-05 MED ORDER — LOSARTAN POTASSIUM 50 MG PO TABS
25.0000 mg | ORAL_TABLET | Freq: Every day | ORAL | Status: DC
  Administered 2017-01-06 – 2017-01-07 (×2): 25 mg via ORAL
  Filled 2017-01-05 (×2): qty 1

## 2017-01-05 MED ORDER — RIVAROXABAN 20 MG PO TABS
20.0000 mg | ORAL_TABLET | Freq: Every day | ORAL | Status: DC
  Administered 2017-01-05 – 2017-01-06 (×2): 20 mg via ORAL
  Filled 2017-01-05 (×3): qty 1

## 2017-01-05 MED ORDER — POLYETHYLENE GLYCOL 3350 17 G PO PACK: 17.0000 g | PACK | Freq: Every day | ORAL | Status: DC | PRN

## 2017-01-05 MED ORDER — PREGABALIN 25 MG PO CAPS
50.0000 mg | ORAL_CAPSULE | Freq: Two times a day (BID) | ORAL | Status: DC
  Administered 2017-01-05 – 2017-01-07 (×4): 50 mg via ORAL
  Filled 2017-01-05 (×5): qty 2

## 2017-01-05 MED ORDER — POTASSIUM CHLORIDE CRYS ER 20 MEQ PO TBCR
40.0000 meq | EXTENDED_RELEASE_TABLET | Freq: Once | ORAL | Status: AC
  Administered 2017-01-05: 40 meq via ORAL
  Filled 2017-01-05: qty 2

## 2017-01-05 MED ORDER — CARVEDILOL 6.25 MG PO TABS
6.2500 mg | ORAL_TABLET | Freq: Two times a day (BID) | ORAL | Status: DC
  Administered 2017-01-06 – 2017-01-07 (×3): 6.25 mg via ORAL
  Filled 2017-01-05 (×4): qty 1

## 2017-01-05 MED ORDER — LORAZEPAM 2 MG/ML IJ SOLN: 2.0000 mg | INTRAMUSCULAR | Status: DC | PRN

## 2017-01-05 MED ORDER — SODIUM CHLORIDE 0.9% FLUSH
3.0000 mL | Freq: Two times a day (BID) | INTRAVENOUS | Status: DC
  Administered 2017-01-05 – 2017-01-07 (×4): 3 mL via INTRAVENOUS

## 2017-01-05 MED ORDER — ADULT MULTIVITAMIN W/MINERALS CH
1.0000 | ORAL_TABLET | Freq: Every day | ORAL | Status: DC
  Administered 2017-01-05 – 2017-01-07 (×3): 1 via ORAL
  Filled 2017-01-05 (×3): qty 1

## 2017-01-05 MED ORDER — VITAMIN B-1 100 MG PO TABS
100.0000 mg | ORAL_TABLET | Freq: Every day | ORAL | Status: DC
  Administered 2017-01-05 – 2017-01-07 (×3): 100 mg via ORAL
  Filled 2017-01-05 (×3): qty 1

## 2017-01-05 MED ORDER — ATORVASTATIN CALCIUM 10 MG PO TABS
20.0000 mg | ORAL_TABLET | Freq: Every day | ORAL | Status: DC
  Administered 2017-01-06 – 2017-01-07 (×2): 20 mg via ORAL
  Filled 2017-01-05 (×2): qty 2

## 2017-01-05 MED ORDER — SPIRONOLACTONE 25 MG PO TABS
25.0000 mg | ORAL_TABLET | Freq: Every day | ORAL | Status: DC
  Administered 2017-01-06 – 2017-01-07 (×2): 25 mg via ORAL
  Filled 2017-01-05 (×2): qty 1

## 2017-01-05 MED ORDER — IPRATROPIUM-ALBUTEROL 0.5-2.5 (3) MG/3ML IN SOLN: 3.0000 mL | RESPIRATORY_TRACT | Status: DC | PRN

## 2017-01-05 NOTE — Consult Note (Signed)
CARDIOLOGY CONSULT NOTE   Patient ID: Paul Clayton MRN: [id number] DOB/AGE: 1946-04-01 71 y.o.  Admit date: 01/05/2017  Requesting Physician: Dr. Darl Householder Physician:   Loralie Champagne, MD Primary Cardiologist:   Dr. Loralie Champagne  ([facility]) Reason for Consultation:   syncope  HPI: Paul Clayton is a 71 y.o. male with a history of COPD, ETOH and cocaine abuse, nonischemic cardiomyopathy presumed from ETOH and cocaine (dx 0626), systolic HF (9/48 showing severe global reduction in LV systolic function with EF 25-30% and grade 1 diastolic dysfunction), hx of afib with RVR on Xarelto, persistent AF s/p DCCV on 01/30/15, HTN, Hepatitis C, colon CA s/p resection , homelessness.   LHC/RHC in 1/16 showed no obstructive CAD and mildly elevated filling pressures.  Lasix was increased to 40 mg daily. He was admitted earlier in 1/16 with syncope, possibly defecation syncope.  He wore a Lifevest for a period of time but no longer (ICD not placed due to ongoing cocaine use). He had DCCV in 4/16 and developed bradycardia. He has had a hx of syncope which was thought to be orthostatic given low BP in the setting of Afib with RVR.  Per chart review he was admitted in 7/16 with cocaine-related chest pain.     He presented to the ER on 3/18 for  Syncope. Reports walking on the side walk and woke up on the ground unknown amount of time later. Does not know what precipitated his syncope but believes his right leg may have given out. He does have some left low back pain that extends down his leg. To the ER physician he endorses maybe having SOB and chest pain before passing out. Neg CT head, non ischemic changes on EKG, normal BNP, normal chest xray.  Carotid dopplers and echo pending.  Coreg 6.25mg , losartan 25mg  and Spiro 25 mg daily ordered- Medicine notes that cardiology has recommended continued use of BB despite cocaine use. Last use 4 days ago.   Admitted to medicine and we were asked to  consult       Past Medical History:  Diagnosis Date  . Adenocarcinoma of colon ([place])    STAGE 1  . Anxiety   . Arthritis    "left hip" (04/19/2015)  . CAD (coronary artery disease)    a. LHC 1/16:  LM 20%, oLAD 30%, oLCx 30%, pRCA 30%  . Chronic lower back pain   . Chronic systolic CHF (congestive heart failure) (HCC)    a. EF previously 15%; b. Echo 7/16:  EF 30-35%, diff HK, gr 1 DD, mild MR, mild LAE, mild reduced RVSF  . COPD (chronic obstructive pulmonary disease) ([place])   . Depression   . GERD (gastroesophageal reflux disease)   . Hepatitis C   . Hypertension   . Migraine    "last one was back in the 1980's" (04/19/2015)  . NICM (nonischemic cardiomyopathy) ([place])    no obs CAD on LHC in 1/16 - ? HTN or substance abuse  . Persistent atrial fibrillation (HCC)    a. s/p DCCV; b. Amiodarone  . Substance abuse    ETOH; crack cocaine  . Tobacco abuse      Past Surgical History:  Procedure Laterality Date  . CARDIAC CATHETERIZATION  10/23/2014  . CARDIOVERSION     "I've had 2 in all" (04/19/2015)  . CARDIOVERSION N/A 01/30/2015   Procedure: CARDIOVERSION;  Surgeon: Larey Dresser, MD;  Location: [facility];  Service: Cardiovascular;  Laterality: N/A;  . COLONOSCOPY    . COLONOSCOPY WITH PROPOFOL N/A 11/30/2014   Procedure: COLONOSCOPY WITH PROPOFOL;  Surgeon: Milus Banister, MD;  Location: [facility];  Service: Endoscopy;  Laterality: N/A;  . LEFT AND RIGHT HEART CATHETERIZATION WITH CORONARY ANGIOGRAM N/A 10/23/2014   Procedure: LEFT AND RIGHT HEART CATHETERIZATION WITH CORONARY ANGIOGRAM;  Surgeon: Larey Dresser, MD;  Location: [facility] CATH LAB;  Service: Cardiovascular;  Laterality: N/A;    No Known Allergies  I have reviewed the patient's current medications  Prior to Admission medications   Medication Sig Start Date End Date Taking? Authorizing Provider  acetaminophen (TYLENOL) 325 MG tablet Take 650 mg by mouth every 6 (six) hours as needed for mild pain.   Yes  Historical Provider, MD  albuterol-ipratropium (COMBIVENT) 18-103 MCG/ACT inhaler Inhale 2 puffs into the lungs every 4 (four) hours as needed for wheezing or shortness of breath. 08/25/16  Yes [place], PA-C  amiodarone (PACERONE) 200 MG tablet Take 1 tablet (200 mg total) by mouth daily. 08/26/16  Yes Amy D Clegg, NP  atorvastatin (LIPITOR) 20 MG tablet Take 1 tablet (20 mg total) by mouth daily. 08/26/16  Yes Amy D Clegg, NP  carvedilol (COREG) 6.25 MG tablet TAKE 1 TABLET (6.25 MG TOTAL) BY MOUTH TWO TIMES DAILY WITH A MEAL. 10/14/16  Yes Larey Dresser, MD  furosemide (LASIX) 40 MG tablet Take 40 mg by mouth daily.   Yes Historical Provider, MD  lidocaine (LIDODERM) 5 % Place 1 patch onto the skin daily. Remove & Discard patch within 12 hours or as directed by MD Patient taking differently: Place 1 patch onto the skin daily as needed (pain). Remove & Discard patch within 12 hours or as directed by MD 06/20/16  Yes Sela Hua, MD  losartan (COZAAR) 25 MG tablet TAKE 1 TABLET (25 MG TOTAL) BY MOUTH DAILY. 11/04/16  Yes Larey Dresser, MD  methocarbamol (ROBAXIN) 500 MG tablet Take 1 tablet (500 mg total) by mouth 2 (two) times daily. Patient taking differently: Take 500 mg by mouth every 8 (eight) hours as needed for muscle spasms.  12/15/16  Yes Nona Dell, PA-C  predniSONE (DELTASONE) 20 MG tablet Take 2 tablets (40 mg total) by mouth daily. 12/15/16  Yes Nona Dell, PA-C  pregabalin (LYRICA) 50 MG capsule Take 1 capsule (50 mg total) by mouth 2 (two) times daily. 11/01/16  Yes Carmin Muskrat, MD  rivaroxaban (XARELTO) 20 MG TABS tablet Take 1 tablet (20 mg total) by mouth daily. 12/04/16  Yes Larey Dresser, MD  spironolactone (ALDACTONE) 25 MG tablet Take 1 tablet (25 mg total) by mouth daily. 08/26/16  Yes Amy D Clegg, NP  topiramate (TOPAMAX) 25 MG capsule Take 25 mg by mouth 2 (two) times daily.   Yes Historical Provider, MD     Social History   Social  History  . Marital status: Widowed    Spouse name: N/A  . Number of children: 4  . Years of education: N/A   Occupational History  . Not on file.   Social History Main Topics  . Smoking status: Current Every Day Smoker    Packs/day: 0.10    Years: 52.00    Types: Cigarettes    Last attempt to quit: 09/01/2014  . Smokeless tobacco: Never Used     Comment: 06/20/16-started back smoking; pack lasts 1 mo; wants to quit again  . Alcohol use 0.0 oz/week     Comment: 04/19/2015 "I  might drink a 40oz beer/month"  . Drug use: Yes    Types: "Crack" cocaine, Marijuana     Comment: 04/19/2015 "I was smoking weed; someone laced it w/crack 04/18/2015; I smoke weed a couple times/yr"  . Sexual activity: Not Currently    Birth control/ protection: Abstinence   Other Topics Concern  . Not on file   Social History Narrative   Currently living in a shelter.    Family Status  Relation Status  . Mother Deceased  . Father Deceased   Family History  Problem Relation Age of Onset  . Hypertension Mother         ROS:  Full 14 point review of systems complete and found to be negative unless listed above.  Physical Exam: Blood pressure (!) 103/54, pulse 79, temperature 98.1 F (36.7 C), resp. rate 17, SpO2 100 %.  General: Well developed, well nourished, male in no acute distress Head: No xanthomas. Normocephalic and atraumatic, Lungs: normal effort and rate of breathing. Heart:: normal rate and rhythm Neck: No carotid bruits. No lymphadenopathy. Abdomen: Bowel sounds present, abdomen soft and non-tender, mild swelling of abdomen Msk:  Spontaneously moves all 4 extremities Extremities: No clubbing or cyanosis.  Neuro: Alert and oriented X 3. No focal deficits noted. Psych:  Good affect, responds appropriately Skin: No rashes or lesions noted.     Labs:   Lab Results  Component Value Date   WBC 9.6 01/04/2017   HGB 14.8 01/04/2017   HCT 43.1 01/04/2017   MCV 96.9 01/04/2017   PLT  198 01/04/2017    Recent Labs Lab 01/04/17 2300  NA 139  K 3.3*  CL 104  CO2 23  BUN 14  CREATININE 0.89  CALCIUM 9.4  GLUCOSE 155*    Recent Labs  01/05/17 0252  TROPONINI <0.03     Echo   Transthoracic Echocardiography 07/08/2016  Study Conclusions  - Left ventricle: The cavity size was severely dilated. Wall   thickness was normal. Systolic function was severely reduced. The   estimated ejection fraction was in the range of 25% to 30%.   Diffuse hypokinesis. Doppler parameters are consistent with   abnormal left ventricular relaxation (grade 1 diastolic   dysfunction). - Aortic root: The aortic root was mildly dilated. - Mitral valve: There was moderate regurgitation. - Left atrium: The atrium was mildly dilated.  Impressions:  - Severe global reduction in LV systolic function; grade 1   diastolic dysfunction; severe LVE; moderate, eccentric MR; mild   LAE.   ECG:     NSR, LVH, prolonged QT, HR 92. - Personally reviewed.   Radiology:  Dg Chest 2 View Result Date: 01/05/2017 The heart size and mediastinal contours are within normal limits. Both lungs are clear. The visualized skeletal structures are unremarkable.   Ct Head Wo Contrast Result Date: 01/05/2017  Normal CT of the brain.      ASSESSMENT AND PLAN:      Active Problems:   Syncope  1. Syncopal episode: Possibly vasovagal, orthostatic, cardiogenic, substance abuse ( ETOH and Cocaine use). --  LHC/RHC in 1/16 showed no obstructive CAD and mildly elevated filling pressures   2. Paroxysmal Atrial Fibrillation: currently in sinus rhythm  --  Most recent ECHO a few months ago with EF of 25% (06/2016), repeat echo pending. --  EKG reviewed with no ischemic changes --  Cycled Troponins are negative  --  Recommend checking lipid panel, A1c, magnesium and TSH --  Closely monitor potassium (3.3)  and magnesium --  Concern for non compliance with medication: home regimen is Coreg 6.25 mg,  Atorvastatin 20 mg, amiodarone     200mg , Losartan 25 mg tablet, spironolactone 25 mg, Xarelto 20 mg.    --  Monitor tele for 24 hours while in hospital for arrhythmia and if none present consider halter event monitor as an outpatient at discharge   4. Chronic systolic CHF: Systolic HF from 1/17 showed severe global reduction in LV systolic function with EF     25-30% and grade 1 diastolic dysfunction.  5 . Nonischemic cardiomyopathy:  Presumed from ETOH and cocaine (dx 2012)  --  plan for repeat echo during this admission --  Coreg 6.25 mg   6.   Hypertension: Losartan and Coreg  7.   Polysubstance abuse: + Cocaine, last use 4 days ago    Signed: Jena Gauss 01/05/2017 12:59 PM   I have personally seen and examined this patient with Delos Haring, PA-C. I agree with the assessment and plan as outlined above. He is known to have a non-ischemic cardiomyopathy and despite this, continues to use cocaine and alcohol as well as tobacco. He had mild CAD noted by cath in 2016. LVEF is known to be 25% by echo 9/17. Now admitted with syncope while having severe leg pain. My exam shows a WDWN male in NAD. CV:RRR. Lungs: clear bilaterally. Abd: soft, NT. Ext: no edema Labs reviewed. EKG shows sinus rhythm, no ischemic changes.  Pt denies chest pain.  I agree that he should be admitted for monitoring x 24 hours. Echo and carotids ordered by primary team. No ischemic workup is indicated. If no arrhythmias on tele, consider 2 week cardiac monitor at time of discharge.   Lauree Chandler 01/05/2017 1:35 PM

## 2017-01-05 NOTE — ED Notes (Signed)
Patient transported to CT 

## 2017-01-05 NOTE — ED Provider Notes (Signed)
[facility] DEPT Provider Note   CSN: [id number] Arrival date & time: 01/04/17  2237  By signing my name below, I, Oleh Genin, attest that this documentation has been prepared under the direction and in the presence of [name], MD. Electronically Signed: Oleh Genin, Scribe. 01/05/17. 4:16 AM.   History   Chief Complaint Chief Complaint  Patient presents with  . Loss of Consciousness    HPI Paul Clayton is a 71 y.o. male with history of colon cancer, CAD, systolic HF, COPD, HTN, A-fib on Xarelto, and cocaine abuse who presents to the ED for evaluation of syncope. This patient states that approximately 4 hours ago he was walking down a sidewalk when he acutely syncopized and "woke up" on the ground a period of time later. He cannot recall the exact precipitating factors to his fall. He believes his L leg "gave out" given his history of L sciatica. He is reporting L lower back pain which extends down his leg. No other injuries. Denies any chest, abdominal, or head pain. No decreased sensation or focal weaknesses distally.   The history is provided by the patient. No language interpreter was used.  Loss of Consciousness   This is a new problem. The current episode started 3 to 5 hours ago. The problem occurs rarely. The problem has been resolved. Length of episode of loss of consciousness: unknown. The problem is associated with normal activity. Pertinent negatives include abdominal pain, chest pain, headaches, light-headedness, seizures and weakness. He has tried nothing for the symptoms. His past medical history does not include seizures.    Past Medical History:  Diagnosis Date  . Adenocarcinoma of colon ([place])    STAGE 1  . Anxiety   . Arthritis    "left hip" (04/19/2015)  . CAD (coronary artery disease)    a. LHC 1/16:  LM 20%, oLAD 30%, oLCx 30%, pRCA 30%  . Chronic lower back pain   . Chronic systolic CHF (congestive heart failure) (HCC)    a. EF  previously 15%; b. Echo 7/16:  EF 30-35%, diff HK, gr 1 DD, mild MR, mild LAE, mild reduced RVSF  . COPD (chronic obstructive pulmonary disease) ([place])   . Depression   . GERD (gastroesophageal reflux disease)   . Hepatitis C   . Hypertension   . Migraine    "last one was back in the 1980's" (04/19/2015)  . NICM (nonischemic cardiomyopathy) ([place])    no obs CAD on LHC in 1/16 - ? HTN or substance abuse  . Persistent atrial fibrillation (HCC)    a. s/p DCCV; b. Amiodarone  . Substance abuse    ETOH; crack cocaine  . Tobacco abuse     Patient Active Problem List   Diagnosis Date Noted  . Health care maintenance 06/20/2016  . Urinary incontinence 05/03/2015  . LBP radiating to left leg 03/12/2015  . Nonischemic cardiomyopathy ([place]) 03/05/2015  . Hx of substance abuse 10/30/2014  . Faintness   . Homeless single person   . Atrial fibrillation with RVR ([place]) 10/13/2014  . High risk social situation 09/30/2014  . Colon cancer ([place]) 09/26/2014  . HFrEF (heart failure with reduced ejection fraction) ([place]) 09/12/2014  . A-fib ([place]) 09/12/2014  . COPD (chronic obstructive pulmonary disease) ([place]) 09/12/2014    Past Surgical History:  Procedure Laterality Date  . CARDIAC CATHETERIZATION  10/23/2014  . CARDIOVERSION     "I've had 2 in all" (04/19/2015)  . CARDIOVERSION N/A 01/30/2015   Procedure: CARDIOVERSION;  Surgeon: Larey Dresser, MD;  Location: [facility];  Service: Cardiovascular;  Laterality: N/A;  . COLONOSCOPY    . COLONOSCOPY WITH PROPOFOL N/A 11/30/2014   Procedure: COLONOSCOPY WITH PROPOFOL;  Surgeon: Milus Banister, MD;  Location: [facility];  Service: Endoscopy;  Laterality: N/A;  . LEFT AND RIGHT HEART CATHETERIZATION WITH CORONARY ANGIOGRAM N/A 10/23/2014   Procedure: LEFT AND RIGHT HEART CATHETERIZATION WITH CORONARY ANGIOGRAM;  Surgeon: Larey Dresser, MD;  Location: [facility] CATH LAB;  Service: Cardiovascular;  Laterality: N/A;       Home Medications    Prior to  Admission medications   Medication Sig Start Date End Date Taking? Authorizing Provider  acetaminophen (TYLENOL) 325 MG tablet Take 650 mg by mouth every 6 (six) hours as needed for mild pain.    Historical Provider, MD  albuterol-ipratropium (COMBIVENT) 18-103 MCG/ACT inhaler Inhale 2 puffs into the lungs every 4 (four) hours as needed for wheezing or shortness of breath. 08/25/16   Mercedes Street, PA-C  amiodarone (PACERONE) 200 MG tablet Take 1 tablet (200 mg total) by mouth daily. 08/26/16   Amy D Clegg, NP  atorvastatin (LIPITOR) 20 MG tablet Take 1 tablet (20 mg total) by mouth daily. 08/26/16   Amy D Clegg, NP  carvedilol (COREG) 6.25 MG tablet TAKE 1 TABLET (6.25 MG TOTAL) BY MOUTH TWO TIMES DAILY WITH A MEAL. 10/14/16   Larey Dresser, MD  lidocaine (LIDODERM) 5 % Place 1 patch onto the skin daily. Remove & Discard patch within 12 hours or as directed by MD 06/20/16   Sela Hua, MD  losartan (COZAAR) 25 MG tablet TAKE 1 TABLET (25 MG TOTAL) BY MOUTH DAILY. 11/04/16   Larey Dresser, MD  methocarbamol (ROBAXIN) 500 MG tablet Take 1 tablet (500 mg total) by mouth 2 (two) times daily. 12/15/16   Nona Dell, PA-C  predniSONE (DELTASONE) 20 MG tablet Take 2 tablets (40 mg total) by mouth daily. 12/15/16   Nona Dell, PA-C  pregabalin (LYRICA) 50 MG capsule Take 1 capsule (50 mg total) by mouth 2 (two) times daily. 11/01/16   Carmin Muskrat, MD  rivaroxaban (XARELTO) 20 MG TABS tablet Take 1 tablet (20 mg total) by mouth daily. 12/04/16   Larey Dresser, MD  spironolactone (ALDACTONE) 25 MG tablet Take 1 tablet (25 mg total) by mouth daily. 08/26/16   Amy D Ninfa Meeker, NP  topiramate (TOPAMAX) 25 MG capsule Take 25 mg by mouth 2 (two) times daily.    Historical Provider, MD    Family History Family History  Problem Relation Age of Onset  . Hypertension Mother     Social History Social History  Substance Use Topics  . Smoking status: Current Every Day Smoker     Packs/day: 0.10    Years: 52.00    Types: Cigarettes    Last attempt to quit: 09/01/2014  . Smokeless tobacco: Never Used     Comment: 06/20/16-started back smoking; pack lasts 1 mo; wants to quit again  . Alcohol use 0.0 oz/week     Comment: 04/19/2015 "I might drink a 40oz beer/month"     Allergies   Patient has no known allergies.   Review of Systems Review of Systems  Cardiovascular: Positive for syncope. Negative for chest pain.  Gastrointestinal: Negative for abdominal pain.  Musculoskeletal:       L leg pain per HPI  Neurological: Negative for seizures, weakness, light-headedness and headaches.  All other systems reviewed and are negative.  Physical Exam Updated Vital Signs BP 135/88   Pulse 81   Temp 98.1 F (36.7 C)   Resp 15   SpO2 98%   Physical Exam  Constitutional: He is oriented to person, place, and time. He appears well-developed and well-nourished. No distress.  HENT:  Head: Normocephalic and atraumatic.  Right Ear: Hearing normal.  Left Ear: Hearing normal.  Nose: Nose normal.  Mouth/Throat: Oropharynx is clear and moist and mucous membranes are normal.  Eyes: Conjunctivae and EOM are normal. Pupils are equal, round, and reactive to light.  Neck: Normal range of motion. Neck supple.  Cardiovascular: Regular rhythm, S1 normal and S2 normal.  Exam reveals no gallop and no friction rub.   No murmur heard. Pulmonary/Chest: Effort normal and breath sounds normal. No respiratory distress. He exhibits no tenderness.  Abdominal: Soft. Normal appearance and bowel sounds are normal. There is no hepatosplenomegaly. There is no tenderness. There is no rebound, no guarding, no tenderness at McBurney's point and negative Murphy's sign. No hernia.  Musculoskeletal: Normal range of motion.  Positive straight leg raise on Left  Neurological: He is alert and oriented to person, place, and time. He has normal strength. No cranial nerve deficit or sensory deficit.  Coordination normal. GCS eye subscore is 4. GCS verbal subscore is 5. GCS motor subscore is 6.  Skin: Skin is warm, dry and intact. No rash noted. No cyanosis.  Psychiatric: He has a normal mood and affect. His speech is normal and behavior is normal. Thought content normal.  Nursing note and vitals reviewed.    ED Treatments / Results  Labs (all labs ordered are listed, but only abnormal results are displayed) Labs Reviewed  BASIC METABOLIC PANEL - Abnormal; Notable for the following:       Result Value   Potassium 3.3 (*)    Glucose, Bld 155 (*)    All other components within normal limits  URINALYSIS, ROUTINE W REFLEX MICROSCOPIC - Abnormal; Notable for the following:    Hgb urine dipstick MODERATE (*)    Leukocytes, UA SMALL (*)    Squamous Epithelial / LPF 0-5 (*)    All other components within normal limits  CBC  TROPONIN I  BRAIN NATRIURETIC PEPTIDE  CBG MONITORING, ED    EKG  EKG Interpretation  Date/Time:  Sunday January 04 2017 22:44:51 EDT Ventricular Rate:  92 PR Interval:  152 QRS Duration: 100 QT Interval:  396 QTC Calculation: 489 R Axis:   70 Text Interpretation:  Normal sinus rhythm Left ventricular hypertrophy with repolarization abnormality Prolonged QT Abnormal ECG No significant change since last tracing Confirmed by POLLINA  MD, CHRISTOPHER [phone]) on 01/05/2017 1:30:24 AM       Radiology Dg Chest 2 View  Result Date: 01/05/2017 CLINICAL DATA:  71 year old male with chest pain and shortness of breath. EXAM: CHEST  2 VIEW COMPARISON:  Chest radiograph dated 08/25/2016 FINDINGS: The heart size and mediastinal contours are within normal limits. Both lungs are clear. The visualized skeletal structures are unremarkable. IMPRESSION: No active cardiopulmonary disease. Electronically Signed   By: Anner Crete M.D.   On: 01/05/2017 02:11   Ct Head Wo Contrast  Result Date: 01/05/2017 CLINICAL DATA:  Syncope EXAM: CT HEAD WITHOUT CONTRAST TECHNIQUE:  Contiguous axial images were obtained from the base of the skull through the vertex without intravenous contrast. COMPARISON:  None. FINDINGS: Brain: No mass lesion, intraparenchymal hemorrhage or extra-axial collection. No evidence of acute cortical infarct. Brain parenchyma and CSF-containing spaces are  normal for age. Vascular: No hyperdense vessel or unexpected calcification. Skull: Normal visualized skull base, calvarium and extracranial soft tissues. Sinuses/Orbits: No sinus fluid levels or advanced mucosal thickening. No mastoid effusion. Normal orbits. IMPRESSION: Normal CT of the brain. Electronically Signed   By: Ulyses Jarred M.D.   On: 01/05/2017 02:28    Procedures Procedures (including critical care time)  Medications Ordered in ED Medications  potassium chloride SA (K-DUR,KLOR-CON) CR tablet 40 mEq (40 mEq Oral Given 01/05/17 0310)     Initial Impression / Assessment and Plan / ED Course  I have reviewed the triage vital signs and the nursing notes.  Pertinent labs & imaging results that were available during my care of the patient were reviewed by me and considered in my medical decision making (see chart for details).     Patient presents to the emergency department for evaluation of syncope. Patient reports that he was walking when he passed out, is not clear what happened. He does not remember the event. He does remember that he was experiencing chest pain and shortness of breath prior to passing out. He reports waking up on the ground. No obvious injuries, but he is on Xarelto because of a history of atrial fibrillation. CT head was therefore performed. No acute abnormality noted. Patient does have a history of nonischemic cardiomyopathy. This puts him at risk for arrhythmia. EKG does not show ischemic changes. No evidence of ST elevation or MI. Troponin negative. Patient does have history of significant congestive heart failure associated with his cardiomyopathy, but appears to  be compensated at this time. BNP is normal. He reports that he does not get lower extremity swelling when he retains fluid, it goes into his abdomen. He hasn't noticed any significant abdominal distention. Based on the symptoms of shortness of breath and chest discomfort prior to passing out, recommend observation on telemetry overnight.  Final Clinical Impressions(s) / ED Diagnoses   Final diagnoses:  Syncope and collapse    New Prescriptions New Prescriptions   No medications on file  I personally performed the services described in this documentation, which was scribed in my presence. The recorded information has been reviewed and is accurate.    [name], MD 01/05/17 [phone]

## 2017-01-05 NOTE — H&P (Signed)
[facility] Admission History and Physical Service Pager: [phone]  Patient name: Paul Clayton Medical record number: [id number] Date of birth: 07/22/1946 Age: 71 y.o. Gender: male  Primary Care Provider: Loralie Champagne, MD Consultants: None  Code Status: Full  Chief Complaint: Loss of consciousness  Assessment and Plan: Paul Clayton is a 71 y.o. male presenting to [facility] after syncopal episode. PMH is significant for afib on xarelto, syncope, L sciatica, HFrEF, substance abuse (cocaine, EtOH), COPD, Hep C.  Syncope: Documented history of syncope felt to be orthostatic, but has also had symptoms consistent with vasovagal in the past while having BM. No loss of bowel or bladder function and did not bite his tongue.  No known seizure history. Endorsed some shortness of breath, leg and back pain prior to "passing out".  Does have COPD and HFrEF and it is not unusual for him to get SOB with exertion.  Also had had falls in the past d/t his LLE "giving out".  Denied palpitations or chest pain during this period.  Does have history of afib and takes amiodarone and coreg daily. No prior malignant arrhthymias.  No electrolyte abnormalities other than mild hypokalemia that was repleted. EtOH negative and does not take any psychiatric medications. Neuro exam without FND. Reports good PO intake past couple of days and denies any nausea, vomiting or diarrhea, making dehydration less likely etiology.  EKG showing normal sinus rhythm with initial trop <0.03 and CT head showing no acute abnormalities and chest x-ray with no acute abnormalities.  - Place in observation with telemetry under attending [place] - repeat echo - carotid dopplers - CK as patient down for unknown amount of time - trend troponins - neuro checks q4hrs - orthostatic vitals - vitals per floor protocol - UDS  HFrEF: History of NICM likely d/t cocaine, HTN and EtOH with last echo 9/17 showing severe global  reduction in LV systolic function with EF 25-30% and grade 1 diastolic dysfunction. Taking coreg 6.25mg  daily, losartan 25mg  daily and spiro 25mg  daily.  BNP mildly elevated to 99.7. Breathing comfortably on room air, no signs of fluid overload on physical exam, and chest x-ray without vascular congestion. Cardiology note recommended continuing beta blocker even in situations of recent cocaine use, so will continue Coreg as patient reporting last use four days ago.  - continue coreg, losartan and spiro  - consider heart failure consult - repeat echo - carotid dopplers  COPD: Continues to smoke 2 cigarettes per week. Prescribed albuterol/ipratropium, but states that he ran out of this.  - combivent PRN - albuterol PRN  Atrial fibrillation: Rate controlled in ED and denies palpitations, CP or SOB. Takes coreg and xarelto. EKG showing normal sinus rhythm. - continuous cardiac monitoring - continue xarelto and coreg  Substance abuse:  Last used cocaine on 01/01/17. Goes to "classes". Last drink was over the weekend (1-2 days ago) - social work consult for substance abuse - CIWA  LBP radiating to left leg: History of sciatica x2 years. DG spine on 2/17 showed mild lumbar degenerative disease and facet arthropathy. Denies red flags. Stated that leg may have "given out" prior to his syncopal episode.  Strength 5/5 in lower extremities and sensation is intact. Does endorse some tenderness with straight leg raise on L side.  - PT/OT consult - continue lyrica 50mg  BID  Hepatitis C: Per problem list. Has not received treatment.  - consider RNA quant - f/u CMP  HLD: takes lipitor 20mg  daily -  cont lipitor  FEN/GI: Regular diet Prophylaxis: xarelto  Disposition: Admit to for observation with telemetry. DC pending medical improvement  History of Present Illness:  Paul Clayton is a 71 y.o. male presenting after a syncopal episode where he was reportedly walking down the sidewalk and then  syncopized and woke up on the ground an unspecified time later. He denied any palpitations, chest pain or auras prior to this episode. Did note some shortness of breath, but this is not unusual for him given his reduced ejection fraction and COPD. Denies having a seizure history and also denied loss of bladder or bowel function or biting his tongue. Denied any nausea, vomiting, diarrhea, or poor PO intake over the past couple of days. States the last time he used cocaine was on 01/01/2017 and last drink was one to 2 days ago.   ED course: CT head showed no acute abnormalities and BMP showed mildly low potassium of 3.3 which was repleted. EKG showing normal sinus rhythm and no acute ST changes and BNP was mildly elevated to 99.7.   Review Of Systems: Per HPI   ROS  Patient Active Problem List   Diagnosis Date Noted  . Syncope 01/05/2017  . Health care maintenance 06/20/2016  . Urinary incontinence 05/03/2015  . LBP radiating to left leg 03/12/2015  . Nonischemic cardiomyopathy ([place]) 03/05/2015  . Hx of substance abuse 10/30/2014  . Faintness   . Homeless single person   . Atrial fibrillation with RVR ([place]) 10/13/2014  . High risk social situation 09/30/2014  . Colon cancer ([place]) 09/26/2014  . HFrEF (heart failure with reduced ejection fraction) ([place]) 09/12/2014  . A-fib ([place]) 09/12/2014  . COPD (chronic obstructive pulmonary disease) ([place]) 09/12/2014    Past Medical History: Past Medical History:  Diagnosis Date  . Adenocarcinoma of colon ([place])    STAGE 1  . Anxiety   . Arthritis    "left hip" (04/19/2015)  . CAD (coronary artery disease)    a. LHC 1/16:  LM 20%, oLAD 30%, oLCx 30%, pRCA 30%  . Chronic lower back pain   . Chronic systolic CHF (congestive heart failure) (HCC)    a. EF previously 15%; b. Echo 7/16:  EF 30-35%, diff HK, gr 1 DD, mild MR, mild LAE, mild reduced RVSF  . COPD (chronic obstructive pulmonary disease) ([place])   . Depression   . GERD (gastroesophageal  reflux disease)   . Hepatitis C   . Hypertension   . Migraine    "last one was back in the 1980's" (04/19/2015)  . NICM (nonischemic cardiomyopathy) ([place])    no obs CAD on LHC in 1/16 - ? HTN or substance abuse  . Persistent atrial fibrillation (HCC)    a. s/p DCCV; b. Amiodarone  . Substance abuse    ETOH; crack cocaine  . Tobacco abuse     Past Surgical History: Past Surgical History:  Procedure Laterality Date  . CARDIAC CATHETERIZATION  10/23/2014  . CARDIOVERSION     "I've had 2 in all" (04/19/2015)  . CARDIOVERSION N/A 01/30/2015   Procedure: CARDIOVERSION;  Surgeon: Larey Dresser, MD;  Location: [facility];  Service: Cardiovascular;  Laterality: N/A;  . COLONOSCOPY    . COLONOSCOPY WITH PROPOFOL N/A 11/30/2014   Procedure: COLONOSCOPY WITH PROPOFOL;  Surgeon: Milus Banister, MD;  Location: [facility];  Service: Endoscopy;  Laterality: N/A;  . LEFT AND RIGHT HEART CATHETERIZATION WITH CORONARY ANGIOGRAM N/A 10/23/2014   Procedure: LEFT AND RIGHT HEART CATHETERIZATION WITH  CORONARY ANGIOGRAM;  Surgeon: Larey Dresser, MD;  Location: [facility] CATH LAB;  Service: Cardiovascular;  Laterality: N/A;    Social History: Social History  Substance Use Topics  . Smoking status: Current Every Day Smoker    Packs/day: 0.10    Years: 52.00    Types: Cigarettes    Last attempt to quit: 09/01/2014  . Smokeless tobacco: Never Used     Comment: 06/20/16-started back smoking; pack lasts 1 mo; wants to quit again  . Alcohol use 0.0 oz/week     Comment: 04/19/2015 "I might drink a 40oz beer/month"    Family History: Family History  Problem Relation Age of Onset  . Hypertension Mother     Allergies and Medications: No Known Allergies No current facility-administered medications on file prior to encounter.    Current Outpatient Prescriptions on File Prior to Encounter  Medication Sig Dispense Refill  . acetaminophen (TYLENOL) 325 MG tablet Take 650 mg by mouth every 6 (six) hours as  needed for mild pain.    Marland Kitchen albuterol-ipratropium (COMBIVENT) 18-103 MCG/ACT inhaler Inhale 2 puffs into the lungs every 4 (four) hours as needed for wheezing or shortness of breath. 1 Inhaler 0  . amiodarone (PACERONE) 200 MG tablet Take 1 tablet (200 mg total) by mouth daily. 90 tablet 3  . atorvastatin (LIPITOR) 20 MG tablet Take 1 tablet (20 mg total) by mouth daily. 90 tablet 3  . carvedilol (COREG) 6.25 MG tablet TAKE 1 TABLET (6.25 MG TOTAL) BY MOUTH TWO TIMES DAILY WITH A MEAL. 60 tablet 3  . lidocaine (LIDODERM) 5 % Place 1 patch onto the skin daily. Remove & Discard patch within 12 hours or as directed by MD (Patient taking differently: Place 1 patch onto the skin daily as needed (pain). Remove & Discard patch within 12 hours or as directed by MD) 30 patch 0  . losartan (COZAAR) 25 MG tablet TAKE 1 TABLET (25 MG TOTAL) BY MOUTH DAILY. 90 tablet 3  . methocarbamol (ROBAXIN) 500 MG tablet Take 1 tablet (500 mg total) by mouth 2 (two) times daily. (Patient taking differently: Take 500 mg by mouth every 8 (eight) hours as needed for muscle spasms. ) 20 tablet 0  . predniSONE (DELTASONE) 20 MG tablet Take 2 tablets (40 mg total) by mouth daily. 10 tablet 0  . pregabalin (LYRICA) 50 MG capsule Take 1 capsule (50 mg total) by mouth 2 (two) times daily. 30 capsule 0  . rivaroxaban (XARELTO) 20 MG TABS tablet Take 1 tablet (20 mg total) by mouth daily. 30 tablet 5  . spironolactone (ALDACTONE) 25 MG tablet Take 1 tablet (25 mg total) by mouth daily. 90 tablet 3  . topiramate (TOPAMAX) 25 MG capsule Take 25 mg by mouth 2 (two) times daily.      Objective: BP 126/65   Pulse 71   Temp 98.1 F (36.7 C)   Resp 13   SpO2 95%  Exam: General: 71 year old male sitting up in bed appearing comfortable and in no acute distress Eyes: EOMI, PERRL, non-injected and anicteric ENTM: no nasal drainage, clear ororpharynx, MMM Neck: supple, no LAD Cardiovascular: RRR, no MRG, 2+ pedal pulses and no  edema Respiratory: no increased WOB, faint bibasilar crackles, no wheezing or rhonchi Gastrointestinal: soft, NTND, no palpable masses, +BS MSK: no gross deformities, no edema, + straight leg raise on LLE Derm: warm and dry Neuro: AAOx3, CN2-12 WNL, no changes in sensation, strength 5/5 throughout. Psych: Normal mood and affect  Labs and Imaging:  CBC BMET   Recent Labs Lab 01/04/17 2300  WBC 9.6  HGB 14.8  HCT 43.1  PLT 198    Recent Labs Lab 01/04/17 2300  NA 139  K 3.3*  CL 104  CO2 23  BUN 14  CREATININE 0.89  GLUCOSE 155*  CALCIUM 9.4     Dg Chest 2 View  Result Date: 01/05/2017 CLINICAL DATA:  71 year old male with chest pain and shortness of breath. EXAM: CHEST  2 VIEW COMPARISON:  Chest radiograph dated 08/25/2016 FINDINGS: The heart size and mediastinal contours are within normal limits. Both lungs are clear. The visualized skeletal structures are unremarkable. IMPRESSION: No active cardiopulmonary disease. Electronically Signed   By: Anner Crete M.D.   On: 01/05/2017 02:11   Ct Head Wo Contrast  Result Date: 01/05/2017 CLINICAL DATA:  Syncope EXAM: CT HEAD WITHOUT CONTRAST TECHNIQUE: Contiguous axial images were obtained from the base of the skull through the vertex without intravenous contrast. COMPARISON:  None. FINDINGS: Brain: No mass lesion, intraparenchymal hemorrhage or extra-axial collection. No evidence of acute cortical infarct. Brain parenchyma and CSF-containing spaces are normal for age. Vascular: No hyperdense vessel or unexpected calcification. Skull: Normal visualized skull base, calvarium and extracranial soft tissues. Sinuses/Orbits: No sinus fluid levels or advanced mucosal thickening. No mastoid effusion. Normal orbits. IMPRESSION: Normal CT of the brain. Electronically Signed   By: Ulyses Jarred M.D.   On: 01/05/2017 02:28    Verner Mould, MD 01/05/2017, 7:22 AM PGY-1, [facility] pager: [phone],  text pages welcome  UPPER LEVEL ADDENDUM  I have read the above note and made revisions highlighted in orange.  Adin Hector, MD, MPH PGY-2 [facility] Pager [phone]

## 2017-01-05 NOTE — ED Notes (Signed)
Urinal dumped and replaced at bedside. Trash removed from room.

## 2017-01-05 NOTE — Progress Notes (Signed)
Pt arrived to 5M04 via stretcher.  Pt alert and oriented.  In no apparent distress, complaints of pain in his back, chronic pain.  Telemetry applied and CCMD notified.  Will continue to monitor.  Cori Razor, RN

## 2017-01-05 NOTE — ED Notes (Signed)
Attempted report to 5M.  

## 2017-01-06 ENCOUNTER — Observation Stay (HOSPITAL_COMMUNITY): Payer: Medicare Other

## 2017-01-06 ENCOUNTER — Other Ambulatory Visit (HOSPITAL_COMMUNITY): Payer: Medicare Other

## 2017-01-06 DIAGNOSIS — R55 Syncope and collapse: Secondary | ICD-10-CM | POA: Diagnosis not present

## 2017-01-06 DIAGNOSIS — I480 Paroxysmal atrial fibrillation: Secondary | ICD-10-CM | POA: Diagnosis not present

## 2017-01-06 LAB — COMPREHENSIVE METABOLIC PANEL
ALK PHOS: 61 U/L (ref 38–126)
ALT: 26 U/L (ref 17–63)
ANION GAP: 8 (ref 5–15)
AST: 24 U/L (ref 15–41)
Albumin: 3.1 g/dL — ABNORMAL LOW (ref 3.5–5.0)
BILIRUBIN TOTAL: 0.7 mg/dL (ref 0.3–1.2)
BUN: 10 mg/dL (ref 6–20)
CALCIUM: 9 mg/dL (ref 8.9–10.3)
CO2: 26 mmol/L (ref 22–32)
CREATININE: 0.89 mg/dL (ref 0.61–1.24)
Chloride: 105 mmol/L (ref 101–111)
GFR calc non Af Amer: >60 mL/min (ref >60)
Glucose, Bld: 97 mg/dL (ref 65–99)
Potassium: 4 mmol/L (ref 3.5–5.1)
Sodium: 139 mmol/L (ref 135–145)
TOTAL PROTEIN: 6.5 g/dL (ref 6.5–8.1)

## 2017-01-06 LAB — CBC
HCT: 41.5 % (ref 39.0–52.0)
Hemoglobin: 13.8 g/dL (ref 13.0–17.0)
MCH: 32.5 pg (ref 26.0–34.0)
MCHC: 33.3 g/dL (ref 30.0–36.0)
MCV: 97.9 fL (ref 78.0–100.0)
PLATELETS: 191 10*3/uL (ref 150–400)
RBC: 4.24 MIL/uL (ref 4.22–5.81)
RDW: 12.9 % (ref 11.5–15.5)
WBC: 6.7 10*3/uL (ref 4.0–10.5)

## 2017-01-06 LAB — LIPID PANEL
Cholesterol: 169 mg/dL (ref 0–200)
HDL: 67 mg/dL (ref >40)
LDL CALC: 88 mg/dL (ref 0–99)
Total CHOL/HDL Ratio: 2.5 RATIO
Triglycerides: 72 mg/dL (ref <150)
VLDL: 14 mg/dL (ref 0–40)

## 2017-01-06 LAB — HEMOGLOBIN A1C
HEMOGLOBIN A1C: 4.9 % (ref 4.8–5.6)
MEAN PLASMA GLUCOSE: 94 mg/dL

## 2017-01-06 LAB — MAGNESIUM: MAGNESIUM: 2.1 mg/dL (ref 1.7–2.4)

## 2017-01-06 LAB — TROPONIN I
Troponin I: <0.03 ng/mL (ref <0.03)
Troponin I: <0.03 ng/mL (ref <0.03)

## 2017-01-06 LAB — TSH: TSH: 0.978 u[IU]/mL (ref 0.350–4.500)

## 2017-01-06 NOTE — Care Management Obs Status (Signed)
[place] NOTIFICATION   Patient Details  Name: GIOVANNIE SCERBO MRN: [id number] Date of Birth: 02/25/1946   Medicare Observation Status Notification Given:  Yes    Pollie Friar, RN 01/06/2017, 3:57 PM

## 2017-01-06 NOTE — Care Management Obs Status (Signed)
[place] NOTIFICATION   Patient Details  Name: FRAN MCREE MRN: [id number] Date of Birth: Mar 16, 1946   Medicare Observation Status Notification Given:  Yes    Pollie Friar, RN 01/06/2017, 3:58 PM

## 2017-01-06 NOTE — Evaluation (Signed)
Physical Therapy Evaluation Patient Details Name: Paul Clayton MRN: [id number] DOB: 01-08-1946 Today's Date: 01/06/2017   History of Present Illness  Paul Clayton is a 71 y.o. 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 Pt had syncopal episode walking downt the sidewalk.  Clinical Impression  Pt presents with decreased static and dynamic balance secondary to above. Pt with moderate fall risk as indicated by score of 18/24 on DGI. Pt would benefit from acute skilled PT to improve balance for safe transition home. Recommend d/c home when medically ready. Acute PT will follow.     Follow Up Recommendations No PT follow up    Equipment Recommendations  None recommended by PT    Recommendations for Other Services       Precautions / Restrictions Precautions Precautions: Fall Precaution Comments: h/o syncope Restrictions Weight Bearing Restrictions: No      Mobility  Bed Mobility Overal bed mobility: Needs Assistance Bed Mobility: Supine to Sit     Supine to sit: Supervision     General bed mobility comments: supervision for safety  Transfers Overall transfer level: Needs assistance Equipment used: None Transfers: Sit to/from Stand Sit to Stand: Supervision         General transfer comment: supervision for safety, increased time, pt reports complaint of pinching in low back upon standing  Ambulation/Gait Ambulation/Gait assistance: Supervision Ambulation Distance (Feet): 180 Feet Assistive device: None Gait Pattern/deviations: Step-through pattern;Decreased stride length;Decreased step length - right;Decreased step length - left Gait velocity:  decreased Gait velocity interpretation: Below normal speed for age/gender General Gait Details: slow speed, supervision for safety  Stairs Stairs: Yes Stairs assistance: Min guard Stair Management: One rail Right;Alternating pattern;Forwards Number of Stairs: 3 General stair comments: min guard for safety  Wheelchair Mobility    Modified Rankin (Stroke Patients Only)       Balance Overall balance assessment: Needs assistance Sitting-balance support: Feet supported;No upper extremity supported Sitting balance-Leahy Scale: Good Sitting balance - Comments: pt sat EOB x 3 min for MD auscultation   Standing balance support: No upper extremity supported Standing balance-Leahy Scale: Good Standing balance comment: stood to don/doff gown         Rhomberg - Eyes Opened: 1     High Level Balance Comments: pt unable to maintain eyes closed romberg, tandem or single leg stance for more than 10 seconds each.  Standardized Balance Assessment Standardized Balance Assessment : Dynamic Gait Index   Dynamic Gait Index Level Surface: Mild Impairment Change in Gait Speed: Normal Gait with Horizontal Head Turns: Mild Impairment Gait with Vertical Head Turns: Normal Gait and Pivot Turn: Mild Impairment Step Over Obstacle: Mild Impairment Step Around Obstacles: Mild Impairment Steps: Mild Impairment Total Score: 18       Pertinent Vitals/Pain Pain Assessment: 0-10 Pain Score: 5  Pain Location: L low back down left leg Pain Descriptors / Indicators:  (pinching) Pain Intervention(s): Limited activity within patient's tolerance;Monitored during session;Repositioned    Home Living Family/patient expects to be discharged to:: Private residence Living Arrangements:  (roomate who is handicapped)   Type of Home: Apartment Home Access: Level entry     Home Layout: Two level;Able to live on main level with  bedroom/bathroom Home Equipment: Cane - single point (but lost it)       Prior Function Level of Independence: Independent               Hand Dominance   Dominant Hand: Right    Extremity/Trunk Assessment   Upper Extremity Assessment Upper Extremity Assessment: Overall WFL for tasks assessed    Lower Extremity Assessment Lower Extremity Assessment: Overall WFL for tasks assessed    Cervical / Trunk Assessment Cervical / Trunk Assessment: Normal  Communication   Communication: No difficulties  Cognition Arousal/Alertness: Awake/alert Behavior During Therapy: WFL for tasks assessed/performed Overall Cognitive Status: Within Functional Limits for tasks assessed                      General Comments      Exercises     Assessment/Plan    PT Assessment Patient needs continued PT services  PT Problem List Decreased balance;Decreased mobility;Pain       PT Treatment Interventions Gait training;Stair training;Neuromuscular re-education;Balance training;Therapeutic exercise;Patient/family education    PT Goals (Current goals can be found in the Care Plan section)  Acute Rehab PT Goals Patient Stated Goal: get back home PT Goal Formulation: With patient Time For Goal Achievement: 01/20/17 Potential to Achieve Goals: Good Additional Goals Additional Goal #1: Pt will score >19 on DGI to demonstrate minimal risk of falls for safe transition home. Additional Goal #2: Pt will perform tandem stance with eyes open, Romberg with eyes closed and SLS on each leg for a minimum of 30 seconds for improved static balance and safe transition home.     Frequency Min 3X/week   Barriers to discharge Decreased caregiver support lives with handicapped roommate who is unable to provide assistance    Co-evaluation               End of Session Equipment Utilized During Treatment: Gait belt Activity Tolerance: Patient tolerated treatment well Patient left: in chair;with call bell/phone within reach;with chair alarm set Nurse  Communication: Mobility status PT Visit Diagnosis: Unsteadiness on feet (R26.81);Pain Pain - part of body:  (back)    Functional Assessment Tool Used: Clinical judgement Functional Limitation: Mobility: Walking and moving around Mobility: Walking and Moving Around Current Status [phone]): At least 1 percent but less than 20 percent impaired, limited or restricted Mobility: Walking and Moving Around Goal Status [phone]): At least 1 percent but less than 20 percent impaired, limited or restricted    Time: 0833-0852 PT Time Calculation (min) (ACUTE ONLY): 19 min   Charges:   PT Evaluation $PT Eval Low Complexity: 1 Procedure     PT G Codes:   PT G-Codes **NOT FOR INPATIENT CLASS** Functional Assessment Tool Used: Clinical judgement Functional Limitation: Mobility: Walking and moving around Mobility: Walking and Moving Around Current Status ([id number]): At least 1 percent but less than 20 percent impaired, limited or restricted Mobility: Walking and Moving Around Goal Status [phone]): At least 1 percent but less than 20 percent impaired, limited or restricted     [facility] 01/06/2017, 11:33 AM  Tracie Harrier, SPT Acute Rehab SPT [phone]

## 2017-01-06 NOTE — Progress Notes (Signed)
Progress Note  Patient Name: Paul Clayton Date of Encounter: 01/06/2017  Primary Cardiologist: Dr. Loralie Champagne (advanced [facility])  Subjective   Pt did well overnight, no chest pain. He does say he had some shortness of breath but his pulse ox read 100% on room air, no arrhythmias noted on telemetry overnight. Pending transthoracic echo to be done today. No signifcant ETOH withdrawal symptoms. He had low back and leg pain overnight.   Inpatient Medications    Scheduled Meds: . amiodarone  200 mg Oral Daily  . atorvastatin  20 mg Oral Daily  . carvedilol  6.25 mg Oral BID WC  . folic acid  1 mg Oral Daily  . losartan  25 mg Oral Daily  . multivitamin with minerals  1 tablet Oral Daily  . pregabalin  50 mg Oral BID  . rivaroxaban  20 mg Oral QPC supper  . sodium chloride flush  3 mL Intravenous Q12H  . spironolactone  25 mg Oral Daily  . thiamine  100 mg Oral Daily   Continuous Infusions:  PRN Meds: ipratropium-albuterol, LORazepam, polyethylene glycol   Vital Signs    Vitals:   01/06/17 0622 01/06/17 0631 01/06/17 0641 01/06/17 0643  BP: 137/73 124/80 135/87 126/80  Pulse: 69 72 99 95  Resp: 18 18 20 20   Temp: 97.9 F (36.6 C)     TempSrc: Oral     SpO2: 100% 99% 100% 100%  Weight:      Height:        Intake/Output Summary (Last 24 hours) at 01/06/17 3762 Last data filed at 01/06/17 0600  Gross per 24 hour  Intake              120 ml  Output             2200 ml  Net            -2080 ml   Filed Weights   01/06/17 0500  Weight: 201 lb 8 oz (91.4 kg)    Telemetry    NSR, HR 95 - Personally Reviewed  ECG    None since yesterday - Personally Reviewed  Physical Exam   GEN: No acute distress.   Neck: No JVD Cardiac: RRR, no murmurs, rubs, or gallops.  Respiratory: Clear to auscultation bilaterally. GI: Soft, nontender, non-distended  MS: No edema; No deformity. Neuro:  Nonfocal  Psych: Normal affect   Labs    Chemistry Recent Labs Lab  01/04/17 2300  NA 139  K 3.3*  CL 104  CO2 23  GLUCOSE 155*  BUN 14  CREATININE 0.89  CALCIUM 9.4  GFRNONAA >60  GFRAA >60  ANIONGAP 12     Hematology Recent Labs Lab 01/04/17 2300 01/06/17 0621  WBC 9.6 6.7  RBC 4.45 4.24  HGB 14.8 13.8  HCT 43.1 41.5  MCV 96.9 97.9  MCH 33.3 32.5  MCHC 34.3 33.3  RDW 13.2 12.9  PLT 198 191    Cardiac Enzymes Recent Labs Lab 01/05/17 0252 01/05/17 1843 01/05/17 2353  TROPONINI <0.03 <0.03 <0.03    BNP Recent Labs Lab 01/05/17 0252  BNP 99.7      Radiology    Radiology:  Dg Chest 2 View Result Date: 01/05/2017 The heart size and mediastinal contours are within normal limits. Both lungs are clear. The visualized skeletal structures are unremarkable.   Ct Head Wo Contrast Result Date: 01/05/2017  Normal CT of the brain.      Cardiac Studies  Transthoracic Echocardiography 07/08/2016  Study Conclusions  - Left ventricle: The cavity size was severely dilated. Wall thickness was normal. Systolic function was severely reduced. The estimated ejection fraction was in the range of 25% to 30%. Diffuse hypokinesis. Doppler parameters are consistent with abnormal left ventricular relaxation (grade 1 diastolic dysfunction). - Aortic root: The aortic root was mildly dilated. - Mitral valve: There was moderate regurgitation. - Left atrium: The atrium was mildly dilated.  Impressions:  - Severe global reduction in LV systolic function; grade 1 diastolic dysfunction; severe LVE; moderate, eccentric MR; mild LAE.  Patient Profile     Paul Clayton is a 71 y.o. 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   LHC/RHC in 1/16 showed no obstructive CAD and mildly elevated filling pressures. Lasix was increased to 40 mg daily. He was admitted earlier in 1/16 with syncope, possibly defecation syncope. He wore a Lifevest for a period of time but no longer (ICD not placed due to ongoing cocaine use). He had DCCV in 4/16 and developed bradycardia. He has had a hx of syncope which was thought to be orthostatic given low BP in the setting of Afib with RVR.  Per chart review he was admitted in 7/16 with cocaine-related chest pain.    He presented to the ER on 3/18 for syncope. Reports walking on the side walk and woke up on the ground unknown amount of time later. Does not know what precipitated his syncope but believes his right leg may have given out. He does have some left low back pain that extends down his leg. To the ER physician he endorses maybe having SOB and chest pain before passing out. Neg CT head, non ischemic changes on EKG, normal BNP, normal chest xray.   Assessment & Plan    1. Syncopal episode: Possibly vasovagal, orthostatic, cardiogenic, substance abuse ( ETOH and Cocaine use).    --  LHC/RHC in 1/16 showed no obstructive CAD and mildly elevated filling pressures  2. Paroxysmal Atrial Fibrillation: In sinus rhythm  --  Most recent ECHO a few months ago with EF of 25% (06/2016),  echo pending for today -- EKG reviewed with no ischemic changes -- Tele with no arrhythmias noted --  Cycled Troponins are negative  -- Recommend checking lipid panel, A1c, magnesium and TSH --  Closely monitor potassium (3.3) and magnesium --  Concern for non compliance with medication: home regimen is Coreg 6.25 mg, Atorvastatin 20 mg, amiodarone 200mg , Losartan 25 mg tablet, spironolactone 25 mg, Xarelto 20 mg.    -- Will need to arrange outpatient cardiac monitor   4. Chronic systolic CHF: Systolic HF from 1/47 showed severe global reduction in LV systolic function with  EF     25-30% andgrade 1 diastolic dysfunction. --  Down 2.6 liters of fluid since admission on 3/19, cont to monitor I/O's  5 . Nonischemic cardiomyopathy:  Presumed from ETOH and cocaine (dx 2012), repeat echo to be performed today.  6.   Hypertension: Losartan and Coreg  7.   Polysubstance abuse: + Cocaine, last use 4 days ago. Managed by primary team.   Signed, Linus Mako, PA-C  01/06/2017, 7:28 AM  I have personally seen and examined this patient with Delos Haring, PA-C I agree with the assessment and plan as outlined above. The etiology of his syncope is unclear. I suspect that it was vasovagal. Echo and carotid pending. No objective evidence of myocardial ischemia. If he has no arrhythmias on tele, we can arrange a cardiac monitor at time of discharge.   Lauree Chandler 01/06/2017 9:40 AM

## 2017-01-06 NOTE — Progress Notes (Signed)
Family Medicine Teaching Service Daily Progress Note Intern Pager: [phone]  Patient name: Paul Clayton Medical record number: [id number] Date of birth: 1945/12/01 Age: 71 y.o. Gender: male  Primary Care Provider: Loralie Champagne, MD Consultants: Cardiology Code Status: Full code  Pt Overview and Major Events to Date:  Admit to FMTS  Assessment and Plan: Paul Clayton is a 71 y.o. male presenting to [facility] after syncopal episode. PMH is significant for afib on xarelto, syncope, L sciatica, HFrEF, substance abuse (cocaine, EtOH), COPD, Hep C.  Syncope:  EKG showing normal sinus rhythm with troponin <0.03 x3. Cardiology consulted who recommend monitoring for arrhytmia and consider halter monitor as outpatient at DC.  Differential includes vasovagal syncope, mechanical fall with LOC or syncope 2/2 valvular stenosis.  - echo today - carotid dopplers - CK to 72 - neuro checks q4hrs - orthostatic vitals- WNL - vitals per floor protocol - UDS- + cocaine and marijuana - cardiology following; appreciate recs  HFrEF: History of NICM likely d/t cocaine, HTN and EtOH with last echo 9/17 showing severe global reduction in LV systolic function with EF 25-30% and grade 1 diastolic dysfunction. Taking coreg 6.25mg  daily, losartan 25mg  daily and spiro 25mg  daily.  Breathing comfortably on room air and not fluid overloaded.  - 2L UOP 24hrs - continue coreg, losartan and spiro  - cards following; appreciate recs - echo today - carotid dopplers  COPD: Continues to smoke 2 cigarettes per week. Prescribed albuterol/ipratropium, but states that he ran out of this.  - combivent PRN - albuterol PRN  Atrial fibrillation: Rate controlled in ED and denies palpitations, CP or SOB. Takes coreg and xarelto. EKG showing normal sinus rhythm. - continuous cardiac monitoring  - continue xarelto and coreg  Substance abuse:  Last used cocaine on 01/01/17. Goes to "classes". Last drink was over the weekend (1-2  days ago) - social work consult for substance abuse - CIWA  LBP radiating to left leg: History of sciatica x2 years. DG spine on 2/17 showed mild lumbar degenerative disease and facet arthropathy. Denies red flags. Stated that leg may have "given out" prior to his syncopal episode.  Strength 5/5 in lower extremities and sensation is intact. Does endorse some tenderness with straight leg raise on L side.  - PT/OT consult - continue lyrica 50mg  BID  Hepatitis C: Per problem list. Has not received treatment.  - consider RNA quant - f/u CMP  HLD: takes lipitor 20mg  daily - cont lipitor  FEN/GI: Regular diet Prophylaxis: xarelto  Disposition: DC pending echo and cardiology recs  Subjective:  Feels well this morning and denies any CP, SOB or palpitations.   Objective: Temp:  [97.7 F (36.5 C)-98.4 F (36.9 C)] 97.9 F (36.6 C) (03/20 0622) Pulse Rate:  [58-99] 95 (03/20 0643) Resp:  [15-24] 20 (03/20 0643) BP: (103-149)/(54-95) 126/80 (03/20 0643) SpO2:  [94 %-100 %] 100 % (03/20 0643) Weight:  [201 lb 8 oz (91.4 kg)] 201 lb 8 oz (91.4 kg) (03/20 0500) Physical Exam: General: 71 yo M sitting on side of bed appearing comfortable Cardiovascular: RRR, no MRG, 2+ palpable pulses Respiratory: no increased WOB, mild bibasilar crackles, no wheezing or rhonchi Abdomen: soft, NTND Extremities: no LE edema  Laboratory:  Recent Labs Lab 01/04/17 2300 01/06/17 0621  WBC 9.6 6.7  HGB 14.8 13.8  HCT 43.1 41.5  PLT 198 191    Recent Labs Lab 01/04/17 2300 01/06/17 0621  NA 139 139  K 3.3* 4.0  CL 104 105  CO2 23 26  BUN 14 10  CREATININE 0.89 0.89  CALCIUM 9.4 9.0  PROT  --  6.5  BILITOT  --  0.7  ALKPHOS  --  61  ALT  --  26  AST  --  24  GLUCOSE 155* 97     Imaging/Diagnostic Tests: Dg Chest 2 View  Result Date: 01/05/2017 CLINICAL DATA:  71 year old male with chest pain and shortness of breath. EXAM: CHEST  2 VIEW COMPARISON:  Chest radiograph dated  08/25/2016 FINDINGS: The heart size and mediastinal contours are within normal limits. Both lungs are clear. The visualized skeletal structures are unremarkable. IMPRESSION: No active cardiopulmonary disease. Electronically Signed   By: Anner Crete M.D.   On: 01/05/2017 02:11   Ct Head Wo Contrast  Result Date: 01/05/2017 CLINICAL DATA:  Syncope EXAM: CT HEAD WITHOUT CONTRAST TECHNIQUE: Contiguous axial images were obtained from the base of the skull through the vertex without intravenous contrast. COMPARISON:  None. FINDINGS: Brain: No mass lesion, intraparenchymal hemorrhage or extra-axial collection. No evidence of acute cortical infarct. Brain parenchyma and CSF-containing spaces are normal for age. Vascular: No hyperdense vessel or unexpected calcification. Skull: Normal visualized skull base, calvarium and extracranial soft tissues. Sinuses/Orbits: No sinus fluid levels or advanced mucosal thickening. No mastoid effusion. Normal orbits. IMPRESSION: Normal CT of the brain. Electronically Signed   By: Ulyses Jarred M.D.   On: 01/05/2017 02:28    Eloise Levels, MD 01/06/2017, 9:16 AM PGY-1, [place] Intern pager: [phone], text pages welcome

## 2017-01-06 NOTE — Progress Notes (Signed)
Pt states last ETOH exposure was Saturday 3/17. No withdrawal symptoms overnight. However, beginning to sweat slightly.

## 2017-01-06 NOTE — Evaluation (Signed)
Occupational Therapy Evaluation Patient Details Name: Paul Clayton MRN: [id number] DOB: August 15, 1946 Today's Date: 01/06/2017    History of Present Illness Paul Clayton is a 71 y.o. 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 Pt had syncopal episode walking down the sidewalk.   Clinical Impression   PTA, pt was independent with ADL and functional mobility but reports that daily tasks were difficult. He currently requires overall supervision for toilet transfers and was limited by back pain this session. Noted decreased short-term memory during evaluation and pt reports this is his baseline but that he is concerned about managing his medications. Educated pt concerning compensatory strategies as detailed below and he verbalizes and demonstrates understanding. No OT follow-up post-acute D/C recommended but feel pt would benefit from skilled OT services while admitted to improve safety and independence with ADL in preparation for return home.     Follow Up Recommendations  No OT follow up    Equipment Recommendations  3 in 1 bedside commode    Recommendations for Other Services       Precautions / Restrictions Precautions Precautions: Fall Precaution Comments: h/o syncope Restrictions Weight Bearing Restrictions: No      Mobility Bed Mobility Overal bed mobility: Needs Assistance Bed Mobility: Supine to Sit     Supine to sit: Supervision     General bed mobility comments: supervision for safety  Transfers Overall transfer level: Needs assistance Equipment used: None Transfers: Sit to/from Stand Sit to Stand: Supervision         General transfer comment:  Supervision for safety.    Balance Overall balance assessment: Needs assistance Sitting-balance support: Feet supported;No upper extremity supported Sitting balance-Leahy Scale: Good     Standing balance support: No upper extremity supported Standing balance-Leahy Scale: Good                              ADL Overall ADL's : Needs assistance/impaired Eating/Feeding: Set up;Sitting   Grooming: Supervision/safety;Standing   Upper Body Bathing: Set up;Sitting   Lower Body Bathing: Supervison/ safety;Sit to/from stand   Upper Body Dressing : Set up;Sitting   Lower Body Dressing: Supervision/safety;Sit to/from stand   Toilet Transfer: Supervision/safety;Ambulation;BSC (BSC over toilet)   Toileting- Clothing Manipulation and Hygiene: Supervision/safety;Sit to/from stand       Functional mobility during ADLs: Supervision/safety General ADL Comments: Pt slightly unsteady during mobility to sink for grooming tasks.      Vision Patient Visual Report: No change from baseline Vision Assessment?: No apparent visual deficits Additional Comments: Acuity in-tact functionally.      Perception     Praxis      Pertinent Vitals/Pain Pain Assessment: 0-10 Pain Score: 6  Pain Location: L low back down left leg Pain Descriptors / Indicators: Aching (pinching) Pain Intervention(s): Limited activity within patient's tolerance;Monitored during session;Repositioned     Hand Dominance Right   Extremity/Trunk Assessment Upper Extremity Assessment Upper Extremity Assessment: Overall WFL for tasks assessed   Lower Extremity Assessment Lower Extremity Assessment: Overall WFL for tasks assessed   Cervical / Trunk Assessment Cervical / Trunk Assessment: Normal   Communication Communication Communication: No difficulties   Cognition  Arousal/Alertness: Awake/alert Behavior During Therapy: WFL for tasks assessed/performed Overall Cognitive Status: Within Functional Limits  for tasks assessed                 General Comments: Pt demonstrating some decreased short-term memory and reports that this is his baseline and he is concerned about his ability to manage medications as he has forgotten to take them in the past. Educated pt on compensatory strategies for medication management and memory including use of pill organizers, setting alarms on his phone, and list making/checking off when he has taken his medications.    General Comments       Exercises       Shoulder Instructions      Home Living Family/patient expects to be discharged to:: Private residence Living Arrangements: Other (Comment) (Roommate who is handicapped)   Type of Home: Apartment Home Access: Level entry     Home Layout: Two level;Able to live on main level with bedroom/bathroom Alternate Level Stairs-Number of Steps: flight (does not go upstairs)   Bathroom Shower/Tub: Tub/shower unit Shower/tub characteristics: Architectural technologist: Standard     Home Equipment: Kasandra Knudsen - single point (lost it on the bus)          Prior Functioning/Environment Level of Independence: Independent        Comments: Cooks and cleans and completes ADL independently at this time. Would prefer to have aide assist with tasks at home (he has previously had a nurse who assisted him approximately a year ago). Takes bus to school for drug cessation classes nearly daily.        OT Problem List: Decreased activity tolerance;Impaired balance (sitting and/or standing);Pain      OT Treatment/Interventions: Self-care/ADL training;Therapeutic exercise;Energy conservation;DME and/or AE instruction;Cognitive remediation/compensation;Patient/family education;Balance training    OT Goals(Current goals can be found in the care plan section) Acute Rehab OT Goals Patient Stated Goal: get back home OT Goal Formulation: With patient Time For Goal Achievement: 01/20/17 Potential to Achieve Goals: Good   OT Frequency: Min 1X/week   Barriers to D/C:            Co-evaluation              End of Session Nurse Communication: Mobility status  Activity Tolerance: Patient tolerated treatment well Patient left: in bed;with call bell/phone within reach;with bed alarm set (With Case Management in room)  OT Visit Diagnosis: Unsteadiness on feet (R26.81)                ADL either performed or assessed with clinical judgement  Time: 1254-1312 OT Time Calculation (min): 18 min Charges:  OT General Charges $OT Visit: 1 Procedure OT Evaluation $OT Eval Moderate Complexity: 1 Procedure G-Codes: OT G-codes **NOT FOR INPATIENT CLASS** Functional Assessment Tool Used: AM-PAC 6 Clicks Daily Activity Functional Limitation: Self care Self Care Current Status (F6433): At least 20 percent but less than 40 percent impaired, limited or restricted Self Care Goal Status (I9518): At least 1 percent but less than 20 percent impaired, limited or restricted   Norman Herrlich, MS OTR/L  Pager: [place] 01/06/2017, 1:54 PM

## 2017-01-07 ENCOUNTER — Observation Stay (HOSPITAL_COMMUNITY): Payer: Medicare Other

## 2017-01-07 ENCOUNTER — Other Ambulatory Visit (HOSPITAL_COMMUNITY): Payer: Medicare Other

## 2017-01-07 DIAGNOSIS — R55 Syncope and collapse: Secondary | ICD-10-CM | POA: Diagnosis not present

## 2017-01-07 DIAGNOSIS — I480 Paroxysmal atrial fibrillation: Secondary | ICD-10-CM | POA: Diagnosis not present

## 2017-01-07 LAB — BASIC METABOLIC PANEL
ANION GAP: 7 (ref 5–15)
BUN: 10 mg/dL (ref 6–20)
CHLORIDE: 106 mmol/L (ref 101–111)
CO2: 25 mmol/L (ref 22–32)
CREATININE: 0.81 mg/dL (ref 0.61–1.24)
Calcium: 8.8 mg/dL — ABNORMAL LOW (ref 8.9–10.3)
GFR calc Af Amer: >60 mL/min (ref >60)
GFR calc non Af Amer: >60 mL/min (ref >60)
Glucose, Bld: 132 mg/dL — ABNORMAL HIGH (ref 65–99)
POTASSIUM: 3.9 mmol/L (ref 3.5–5.1)
Sodium: 138 mmol/L (ref 135–145)

## 2017-01-07 LAB — HEMOGLOBIN A1C
Hgb A1c MFr Bld: 4.9 % (ref 4.8–5.6)
MEAN PLASMA GLUCOSE: 94 mg/dL

## 2017-01-07 LAB — CBC
HEMATOCRIT: 40.9 % (ref 39.0–52.0)
HEMOGLOBIN: 13.2 g/dL (ref 13.0–17.0)
MCH: 31.8 pg (ref 26.0–34.0)
MCHC: 32.3 g/dL (ref 30.0–36.0)
MCV: 98.6 fL (ref 78.0–100.0)
Platelets: 177 10*3/uL (ref 150–400)
RBC: 4.15 MIL/uL — AB (ref 4.22–5.81)
RDW: 13 % (ref 11.5–15.5)
WBC: 7.6 10*3/uL (ref 4.0–10.5)

## 2017-01-07 NOTE — Progress Notes (Signed)
Progress Note  Patient Name: Paul Clayton Date of Encounter: 01/07/2017  Primary Cardiologist: Dr. Loralie Champagne ([facility])  Subjective   No chest pain or dyspnea.   Inpatient Medications    Scheduled Meds: . amiodarone  200 mg Oral Daily  . atorvastatin  20 mg Oral Daily  . carvedilol  6.25 mg Oral BID WC  . folic acid  1 mg Oral Daily  . losartan  25 mg Oral Daily  . multivitamin with minerals  1 tablet Oral Daily  . pregabalin  50 mg Oral BID  . rivaroxaban  20 mg Oral QPC supper  . sodium chloride flush  3 mL Intravenous Q12H  . spironolactone  25 mg Oral Daily  . thiamine  100 mg Oral Daily   Continuous Infusions:  PRN Meds: ipratropium-albuterol, LORazepam, polyethylene glycol   Vital Signs    Vitals:   01/06/17 1735 01/07/17 0149 01/07/17 0516 01/07/17 0918  BP: (!) 148/73 112/66 136/71 115/76  Pulse: 67 64 (!) 56 79  Resp: 20 16 16 20   Temp: 97.7 F (36.5 C) 98.2 F (36.8 C) 98.8 F (37.1 C) 98.6 F (37 C)  TempSrc: Oral Oral Oral Oral  SpO2:  97% 99% 98%  Weight:      Height:        Intake/Output Summary (Last 24 hours) at 01/07/17 0938 Last data filed at 01/07/17 0858  Gross per 24 hour  Intake             1323 ml  Output             1450 ml  Net             -127 ml   Filed Weights   01/06/17 0500  Weight: 201 lb 8 oz (91.4 kg)    Telemetry    Sinus  - Personally Reviewed  ECG     Physical Exam   GEN: No acute distress.   Neck: No JVD Cardiac: RRR, no murmurs, rubs, or gallops.  Respiratory: Clear to auscultation bilaterally. GI: Soft, nontender, non-distended  MS: No edema; No deformity. Neuro:  Nonfocal  Psych: Normal affect   Labs    Chemistry  Recent Labs Lab 01/04/17 2300 01/06/17 0621 01/07/17 0406  NA 139 139 138  K 3.3* 4.0 3.9  CL 104 105 106  CO2 23 26 25   GLUCOSE 155* 97 132*  BUN 14 10 10   CREATININE 0.89 0.89 0.81  CALCIUM 9.4 9.0 8.8*  PROT  --  6.5  --   ALBUMIN  --  3.1*  --     AST  --  24  --   ALT  --  26  --   ALKPHOS  --  61  --   BILITOT  --  0.7  --   GFRNONAA >60 >60 >60  GFRAA >60 >60 >60  ANIONGAP 12 8 7      Hematology  Recent Labs Lab 01/04/17 2300 01/06/17 0621 01/07/17 0406  WBC 9.6 6.7 7.6  RBC 4.45 4.24 4.15*  HGB 14.8 13.8 13.2  HCT 43.1 41.5 40.9  MCV 96.9 97.9 98.6  MCH 33.3 32.5 31.8  MCHC 34.3 33.3 32.3  RDW 13.2 12.9 13.0  PLT 198 191 177    Cardiac Enzymes  Recent Labs Lab 01/05/17 0252 01/05/17 1843 01/05/17 2353 01/06/17 0621  TROPONINI <0.03 <0.03 <0.03 <0.03    BNP  Recent Labs Lab 01/05/17 0252  BNP 99.7  Cardiac Studies   Transthoracic Echocardiography: pending  Patient Profile    71 y.o. male with a history of COPD, ETOH/cocaine abuse, NICM, chronic systolic chf, atrial fib, HTN admitted after syncopal episode after episode of severe leg pain  Assessment & Plan    1. Syncopal episode: Likely vasovagal. No arrhythmias on tele. I don't think a cardiac monitor is necessary at this time. Echo is likely not going to change out plan.    2. Paroxysmal Atrial Fibrillation: In sinus rhythm this am. Continue current meds including Coreg, amiodarone and Xarelto.   3. Chronic systolic CHF/NICM: LVEF is known to be less than 30% by echo Sept 2017. Volume status is ok.  I would not repeat the echo at this time.   OK to d/c home from cardiac perspective. Cardiology will sign off. Please call with questions.   Signed, Lauree Chandler, MD  01/07/2017, 9:38 AM

## 2017-01-07 NOTE — Care Management Note (Signed)
Case Management Note  Patient Details  Name: YOUSOF ALDERMAN MRN: [id number] Date of Birth: Jun 26, 1946  Subjective/Objective:    Pt in with syncope. Pt states he lives in an apartment with a roommate.               Action/Plan: No f/u per PT/OT. CM following for d/c needs, physician orders.  Expected Discharge Date:                  Expected Discharge Plan:  Home/Self Care  In-House Referral:     Discharge planning Services     Post Acute Care Choice:    Choice offered to:     DME Arranged:    DME Agency:     HH Arranged:    HH Agency:     Status of Service:  In process, will continue to follow  If discussed at Long Length of Stay Meetings, dates discussed:    Additional Comments:  Pollie Friar, RN 01/07/2017, 8:37 AM

## 2017-01-07 NOTE — Progress Notes (Signed)
Occupational Therapy Treatment/Discharge Patient Details Name: KELLIE CHISOLM MRN: [id number] DOB: 1946/07/29 Today's Date: 01/07/2017    History of present illness ZIAIRE HAGOS is a 71 y.o. 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 Pt had syncopal episode walking downt the sidewalk.   OT comments  Pt demonstrates good progress toward OT goals. He was able to demonstrate ability to complete ADL in hospital setting with modified independence this session. Completed education concerning compensatory cognitive strategies and pt verbalizes understanding. All education complete and goals have been met. No further OT needs. OT will sign off.  Follow Up Recommendations  No OT follow up    Equipment Recommendations  3 in 1 bedside commode    Recommendations for Other Services      Precautions / Restrictions Precautions Precautions: Fall Precaution Comments: h/o syncope Restrictions Weight Bearing Restrictions: No       Mobility Bed Mobility Overal bed mobility: Modified Independent                Transfers Overall transfer level: Modified independent                    Balance Overall balance assessment: Modified Independent                                 ADL Overall ADL's : Modified independent                                       General ADL Comments: Able to complete basic ADL with modified independence in hospital setting this session. Completed further education concerning compensatory cognitive strategies for memory and pt able to verbalize with min VC's.      Vision                     Perception      Praxis      Cognition   Behavior During Therapy: WFL for tasks assessed/performed Overall Cognitive Status: Within Functional Limits for tasks assessed                         Exercises     Shoulder Instructions       General Comments      Pertinent Vitals/ Pain       Pain Assessment: 0-10 Pain Score: 4  Pain Location: L low back down left leg Pain Descriptors / Indicators:  (pinching) Pain Intervention(s): Monitored during session  Home Living                                          Prior Functioning/Environment              Frequency  Min 1X/week        Progress Toward Goals  OT Goals(current goals can now be found in the care plan section)  Progress towards OT goals: Goals met/education completed, patient discharged  from OT  Acute Rehab OT Goals Patient Stated Goal: get back home OT Goal Formulation: With patient Time For Goal Achievement: 01/20/17 Potential to Achieve Goals: Good ADL Goals Pt Will Transfer to Toilet: with modified independence;bedside commode;ambulating (BSC over toilet) Pt Will Perform Toileting - Clothing Manipulation and hygiene: with modified independence;sit to/from stand Pt Will Perform Tub/Shower Transfer: with modified independence;Tub transfer;3 in 1;ambulating Additional ADL Goal #1: Pt will incorporate 3 strategies to compensate for decreased short-term memory into morning ADL routine to improve independence with medication management.  Plan Discharge plan remains appropriate    Co-evaluation                 End of Session    OT Visit Diagnosis: Unsteadiness on feet (R26.81)   Activity Tolerance Patient tolerated treatment well   Patient Left in bed;with call bell/phone within reach (Sitting at EOB)   Nurse Communication Mobility status    Functional Assessment Tool Used: AM-PAC 6 Clicks Daily Activity Functional Limitation: Self care Self Care Current Status (J1884): 0 percent  impaired, limited or restricted Self Care Goal Status (Z6606): At least 1 percent but less than 20 percent impaired, limited or restricted Self Care Discharge Status [phone]): 0 percent impaired, limited or restricted   Time: 1157-1206 OT Time Calculation (min): 9 min  Charges: OT G-codes **NOT FOR INPATIENT CLASS** Functional Assessment Tool Used: AM-PAC 6 Clicks Daily Activity Functional Limitation: Self care Self Care Current Status (F0932): 0 percent impaired, limited or restricted Self Care Goal Status (T5573): At least 1 percent but less than 20 percent impaired, limited or restricted Self Care Discharge Status [phone]): 0 percent impaired, limited or restricted OT General Charges $OT Visit: 1 Procedure OT Treatments $Self Care/Home Management : 8-22 mins  Norman Herrlich, MS OTR/L  Pager: [place] 01/07/2017, 12:13 PM

## 2017-01-07 NOTE — Clinical Social Work Note (Signed)
Patient medically stable for discharge today and requested transport home via taxi. CSW talked with patient and was advised that he does not have a coat with him and his daughter is unable to come pick him up this evening.  Taxi voucher provided.  Pamela Maddy Givens, MSW, LCSW Licensed Clinical Social Worker [place] [phone]

## 2017-01-07 NOTE — Progress Notes (Signed)
Family Medicine Teaching Service Daily Progress Note Intern Pager: [phone]  Patient name: Paul Clayton Medical record number: [id number] Date of birth: [birth year] Age: 70 y.o. Gender: male  Primary Care Provider: Loralie Champagne, MD Consultants: Cardiology Code Status: Full code  Pt Overview and Major Events to Date:  Admit to FMTS  Assessment and Plan: [name] is a 71 y.o. male presenting to [facility] after syncopal episode. PMH is significant for afib on xarelto, syncope, L sciatica, HFrEF, substance abuse (cocaine, EtOH), COPD, Hep C.  Syncope:  EKG showing normal sinus rhythm with troponin <0.03 x3.  Spoke with Dr. Angelena Form this AM who felt this was likely vasovagal and noted echo and holter monitor were of low utility and would not change plan and felt that from cardiac standpoint he was ok for D/C.  - neuro checks q4hrs - orthostatic vitals- WNL - vitals per floor protocol - cardiology following; appreciate recs  HFrEF: History of NICM likely d/t cocaine, HTN and EtOH with last echo 9/17 showing severe global reduction in LV systolic function with EF 25-30% and grade 1 diastolic dysfunction. Taking coreg 6.25mg  daily, losartan 25mg  daily and spiro 25mg  daily.  Breathing comfortably on room air and not fluid overloaded.  Will not perform echo per cards.  - 1.5L UOP 24hrs - continue coreg, losartan and spiro  - cards following; appreciate recs  COPD: Continues to smoke 2 cigarettes per week. Prescribed albuterol/ipratropium, but states that he ran out of this.  - combivent PRN - albuterol PRN  Atrial fibrillation, rate controlled: Takes coreg and xarelto. Denies symptoms.  - continuous cardiac monitoring  - continue xarelto and coreg  Substance abuse:  Last used cocaine on 01/01/17. Goes to "classes". Last drink was over the weekend (1-2 days ago) - social work consult for substance abuse - CIWA- not scoring  LBP radiating to left leg, stable:   - PT/OT- no f/u -  continue lyrica 50mg  BID  Hepatitis C: Per problem list. Has not received treatment.  - consider RNA quant - f/u CMP  HLD: takes lipitor 20mg  daily - cont lipitor  FEN/GI: Regular diet Prophylaxis: xarelto  Disposition: DC today  Subjective:  Feels well this morning and denies any CP, SOB or palpitations.   Objective: Temp:  [97.7 F (36.5 C)-98.8 F (37.1 C)] 98.8 F (37.1 C) (03/21 0516) Pulse Rate:  [56-75] 56 (03/21 0516) Resp:  [16-20] 16 (03/21 0516) BP: (111-148)/(61-73) 136/71 (03/21 0516) SpO2:  [97 %-100 %] 99 % (03/21 0516) Physical Exam: General: 71 yo M sitting on side of bed appearing comfortable Cardiovascular: RRR, no MRG, 2+ palpable pulses Respiratory: no increased WOB, mild bibasilar crackles, no wheezing or rhonchi Abdomen: soft, NTND Extremities: no LE edema  Laboratory:  Recent Labs Lab 01/04/17 2300 01/06/17 0621 01/07/17 0406  WBC 9.6 6.7 7.6  HGB 14.8 13.8 13.2  HCT 43.1 41.5 40.9  PLT 198 191 177    Recent Labs Lab 01/04/17 2300 01/06/17 0621 01/07/17 0406  NA 139 139 138  K 3.3* 4.0 3.9  CL 104 105 106  CO2 23 26 25   BUN 14 10 10   CREATININE 0.89 0.89 0.81  CALCIUM 9.4 9.0 8.8*  PROT  --  6.5  --   BILITOT  --  0.7  --   ALKPHOS  --  61  --   ALT  --  26  --   AST  --  24  --   GLUCOSE 155* 97 132*  Imaging/Diagnostic Tests: Dg Chest 2 View  Result Date: 01/05/2017 CLINICAL DATA:  71 year old male with chest pain and shortness of breath. EXAM: CHEST  2 VIEW COMPARISON:  Chest radiograph dated 08/25/2016 FINDINGS: The heart size and mediastinal contours are within normal limits. Both lungs are clear. The visualized skeletal structures are unremarkable. IMPRESSION: No active cardiopulmonary disease. Electronically Signed   By: Anner Crete M.D.   On: 01/05/2017 02:11   Ct Head Wo Contrast  Result Date: 01/05/2017 CLINICAL DATA:  Syncope EXAM: CT HEAD WITHOUT CONTRAST TECHNIQUE: Contiguous axial images were  obtained from the base of the skull through the vertex without intravenous contrast. COMPARISON:  None. FINDINGS: Brain: No mass lesion, intraparenchymal hemorrhage or extra-axial collection. No evidence of acute cortical infarct. Brain parenchyma and CSF-containing spaces are normal for age. Vascular: No hyperdense vessel or unexpected calcification. Skull: Normal visualized skull base, calvarium and extracranial soft tissues. Sinuses/Orbits: No sinus fluid levels or advanced mucosal thickening. No mastoid effusion. Normal orbits. IMPRESSION: Normal CT of the brain. Electronically Signed   By: Ulyses Jarred M.D.   On: 01/05/2017 02:28    Eloise Levels, MD 01/07/2017, 7:31 AM PGY-1, [place] Intern pager: [phone], text pages welcome

## 2017-01-07 NOTE — Discharge Summary (Signed)
[facility] Discharge Summary  Patient name: Paul Clayton Medical record number: [id number] Date of birth: 05/01/1946 Age: 71 y.o. Gender: male Date of Admission: 01/05/2017  Date of Discharge: 01/07/2017 Admitting Physician: Kinnie Feil, MD  Primary Care Provider: Loralie Champagne, MD Consultants: Cardiology   Indication for Hospitalization: Syncope   Discharge Diagnoses/Problem List:  Syncope HFrEF COPD AFib  Substance Abuse  Sciatica  Hep C  HLD  HTN   Disposition: Home   Discharge Condition: Stable  Discharge Exam:  General: 71 yo M sitting on side of bed appearing comfortable Cardiovascular: RRR, no MRG, 2+ palpable pulses Respiratory: no increased WOB, mild bibasilar crackles, no wheezing or rhonchi Abdomen: soft, NTND Extremities: no LE edema  Brief Hospital Course:  Paul Clayton is 71 y.o. male with PMH of Afib on Xarelto, HFrEF, poly-substance abuse, COPD and Hep C who presented after syncopal event at home. CT head was negative and EKG was NSR without ST segment changes. Patient was admitted for monitoring. Troponins were negative and no arrhythmias were noted on telemetry. Orthostatic vitals were within normal limits. Cardiology was consulted. Felt that syncope was likely vasovagal in nature. Did not believe echo was necessary as it would not likely change management. Also did not feel like an event monitor was necessary as no arrhythmias were captured on telemetry.   Issues for Follow Up:  1. Does not appear from chart review that patient has received treatment for Hep C. Would repeat labs and may need ID referral.  2. Discussion regarding whether patient has interest in quitting substance abuse.   Significant Procedures: None   Significant Labs and Imaging:   Recent Labs Lab 01/04/17 2300 01/06/17 0621 01/07/17 0406  WBC 9.6 6.7 7.6  HGB 14.8 13.8 13.2  HCT 43.1 41.5 40.9  PLT 198 191 177    Recent Labs Lab  01/04/17 2300 01/06/17 0621 01/06/17 0923 01/07/17 0406  NA 139 139  --  138  K 3.3* 4.0  --  3.9  CL 104 105  --  106  CO2 23 26  --  25  GLUCOSE 155* 97  --  132*  BUN 14 10  --  10  CREATININE 0.89 0.89  --  0.81  CALCIUM 9.4 9.0  --  8.8*  MG  --   --  2.1  --   ALKPHOS  --  61  --   --   AST  --  24  --   --   ALT  --  26  --   --   ALBUMIN  --  3.1*  --   --    UDS: +Cocaine and THC  Troponin: negative x3 Lipid profile: Cholesterol 169, HDL 67, LDL 88, Triglycerides 72  Dg Chest 2 View  Result Date: 01/05/2017 CLINICAL DATA:  71 year old male with chest pain and shortness of breath. EXAM: CHEST  2 VIEW COMPARISON:  Chest radiograph dated 08/25/2016 FINDINGS: The heart size and mediastinal contours are within normal limits. Both lungs are clear. The visualized skeletal structures are unremarkable. IMPRESSION: No active cardiopulmonary disease. Electronically Signed   By: Anner Crete M.D.   On: 01/05/2017 02:11   Ct Head Wo Contrast  Result Date: 01/05/2017 CLINICAL DATA:  Syncope EXAM: CT HEAD WITHOUT CONTRAST TECHNIQUE: Contiguous axial images were obtained from the base of the skull through the vertex without intravenous contrast. COMPARISON:  None. FINDINGS: Brain: No mass lesion, intraparenchymal hemorrhage or extra-axial collection. No evidence  of acute cortical infarct. Brain parenchyma and CSF-containing spaces are normal for age. Vascular: No hyperdense vessel or unexpected calcification. Skull: Normal visualized skull base, calvarium and extracranial soft tissues. Sinuses/Orbits: No sinus fluid levels or advanced mucosal thickening. No mastoid effusion. Normal orbits. IMPRESSION: Normal CT of the brain. Electronically Signed   By: Ulyses Jarred M.D.   On: 01/05/2017 02:28    Results/Tests Pending at Time of Discharge: None   Discharge Medications:  Allergies as of 01/07/2017   No Known Allergies     Medication List    STOP taking these medications    acetaminophen 325 MG tablet Commonly known as:  TYLENOL     TAKE these medications   albuterol-ipratropium 18-103 MCG/ACT inhaler Commonly known as:  COMBIVENT Inhale 2 puffs into the lungs every 4 (four) hours as needed for wheezing or shortness of breath.   amiodarone 200 MG tablet Commonly known as:  PACERONE Take 1 tablet (200 mg total) by mouth daily.   atorvastatin 20 MG tablet Commonly known as:  LIPITOR Take 1 tablet (20 mg total) by mouth daily.   carvedilol 6.25 MG tablet Commonly known as:  COREG TAKE 1 TABLET (6.25 MG TOTAL) BY MOUTH TWO TIMES DAILY WITH A MEAL.   furosemide 40 MG tablet Commonly known as:  LASIX Take 40 mg by mouth daily.   lidocaine 5 % Commonly known as:  LIDODERM Place 1 patch onto the skin daily. Remove & Discard patch within 12 hours or as directed by MD What changed:  when to take this  reasons to take this  additional instructions   losartan 25 MG tablet Commonly known as:  COZAAR TAKE 1 TABLET (25 MG TOTAL) BY MOUTH DAILY.   methocarbamol 500 MG tablet Commonly known as:  ROBAXIN Take 1 tablet (500 mg total) by mouth 2 (two) times daily. What changed:  when to take this  reasons to take this   predniSONE 20 MG tablet Commonly known as:  DELTASONE Take 2 tablets (40 mg total) by mouth daily.   pregabalin 50 MG capsule Commonly known as:  LYRICA Take 1 capsule (50 mg total) by mouth 2 (two) times daily.   rivaroxaban 20 MG Tabs tablet Commonly known as:  XARELTO Take 1 tablet (20 mg total) by mouth daily.   spironolactone 25 MG tablet Commonly known as:  ALDACTONE Take 1 tablet (25 mg total) by mouth daily.   topiramate 25 MG capsule Commonly known as:  TOPAMAX Take 25 mg by mouth 2 (two) times daily.       Discharge Instructions: Please refer to Patient Instructions section of EMR for full details.  Patient was counseled important signs and symptoms that should prompt return to medical care, changes in  medications, dietary instructions, activity restrictions, and follow up appointments.   Follow-Up Appointments: Follow-up Information    Charlie Pitter, PA-C. Go on 01/14/2017.   Specialties:  Cardiology, Radiology Why:  Please go to this appointment scheduled for you at 2pm, please arrive to your appointment early to have your blood/ Bmet rechecked. Contact information: [address] [place] [address] [phone]        Marjie Skiff, MD. Go on 01/12/2017.   Specialty:  Family Medicine Why:  For hosptial follow up at [facility] information: [place] [address] [phone]           Nicolette Bang, DO 01/08/2017, 5:22 PM PGY-2, [place]

## 2017-01-12 ENCOUNTER — Inpatient Hospital Stay: Payer: Medicare Other | Admitting: Family Medicine

## 2017-03-04 ENCOUNTER — Emergency Department (HOSPITAL_COMMUNITY): Payer: Medicare Other

## 2017-03-04 ENCOUNTER — Encounter (HOSPITAL_COMMUNITY): Payer: Self-pay

## 2017-03-04 ENCOUNTER — Emergency Department (HOSPITAL_COMMUNITY)
Admission: EM | Admit: 2017-03-04 | Discharge: 2017-03-04 | Disposition: A | Discharge status: Home / Self Care | Payer: Medicare Other | Attending: Emergency Medicine | Admitting: Emergency Medicine

## 2017-03-04 DIAGNOSIS — N179 Acute kidney failure, unspecified: Secondary | ICD-10-CM

## 2017-03-04 DIAGNOSIS — Z7901 Long term (current) use of anticoagulants: Secondary | ICD-10-CM | POA: Insufficient documentation

## 2017-03-04 DIAGNOSIS — Z79899 Other long term (current) drug therapy: Secondary | ICD-10-CM | POA: Diagnosis not present

## 2017-03-04 DIAGNOSIS — I5022 Chronic systolic (congestive) heart failure: Secondary | ICD-10-CM | POA: Diagnosis not present

## 2017-03-04 DIAGNOSIS — I2510 Atherosclerotic heart disease of native coronary artery without angina pectoris: Secondary | ICD-10-CM | POA: Diagnosis not present

## 2017-03-04 DIAGNOSIS — Z85038 Personal history of other malignant neoplasm of large intestine: Secondary | ICD-10-CM | POA: Diagnosis not present

## 2017-03-04 DIAGNOSIS — R109 Unspecified abdominal pain: Secondary | ICD-10-CM | POA: Diagnosis not present

## 2017-03-04 DIAGNOSIS — F17210 Nicotine dependence, cigarettes, uncomplicated: Secondary | ICD-10-CM | POA: Diagnosis not present

## 2017-03-04 DIAGNOSIS — J449 Chronic obstructive pulmonary disease, unspecified: Secondary | ICD-10-CM | POA: Insufficient documentation

## 2017-03-04 DIAGNOSIS — N201 Calculus of ureter: Secondary | ICD-10-CM

## 2017-03-04 DIAGNOSIS — I110 Hypertensive heart disease with heart failure: Secondary | ICD-10-CM | POA: Diagnosis not present

## 2017-03-04 DIAGNOSIS — N132 Hydronephrosis with renal and ureteral calculous obstruction: Secondary | ICD-10-CM | POA: Diagnosis not present

## 2017-03-04 LAB — COMPREHENSIVE METABOLIC PANEL
ALT: 34 U/L (ref 17–63)
AST: 39 U/L (ref 15–41)
Albumin: 4.1 g/dL (ref 3.5–5.0)
Alkaline Phosphatase: 69 U/L (ref 38–126)
Anion gap: 10 (ref 5–15)
BUN: 15 mg/dL (ref 6–20)
CO2: 24 mmol/L (ref 22–32)
Calcium: 9.2 mg/dL (ref 8.9–10.3)
Chloride: 106 mmol/L (ref 101–111)
Creatinine, Ser: 1.3 mg/dL — ABNORMAL HIGH (ref 0.61–1.24)
GFR calc Af Amer: >60 mL/min (ref >60)
GFR calc non Af Amer: 54 mL/min — ABNORMAL LOW (ref >60)
GLUCOSE: 89 mg/dL (ref 65–99)
POTASSIUM: 3.9 mmol/L (ref 3.5–5.1)
Sodium: 140 mmol/L (ref 135–145)
TOTAL PROTEIN: 7.8 g/dL (ref 6.5–8.1)
Total Bilirubin: 1.4 mg/dL — ABNORMAL HIGH (ref 0.3–1.2)

## 2017-03-04 LAB — CBC WITH DIFFERENTIAL/PLATELET
Basophils Absolute: 0 10*3/uL (ref 0.0–0.1)
Basophils Relative: 0 %
Eosinophils Absolute: 0.1 10*3/uL (ref 0.0–0.7)
Eosinophils Relative: 1 %
HEMATOCRIT: 41.7 % (ref 39.0–52.0)
Hemoglobin: 14.7 g/dL (ref 13.0–17.0)
LYMPHS ABS: 1.9 10*3/uL (ref 0.7–4.0)
LYMPHS PCT: 20 %
MCH: 34.3 pg — ABNORMAL HIGH (ref 26.0–34.0)
MCHC: 35.3 g/dL (ref 30.0–36.0)
MCV: 97.4 fL (ref 78.0–100.0)
MONO ABS: 1.1 10*3/uL — AB (ref 0.1–1.0)
MONOS PCT: 12 %
Neutro Abs: 6.4 10*3/uL (ref 1.7–7.7)
Neutrophils Relative %: 67 %
Platelets: 186 10*3/uL (ref 150–400)
RBC: 4.28 MIL/uL (ref 4.22–5.81)
RDW: 12.6 % (ref 11.5–15.5)
WBC: 9.6 10*3/uL (ref 4.0–10.5)

## 2017-03-04 LAB — URINALYSIS, ROUTINE W REFLEX MICROSCOPIC
BILIRUBIN URINE: NEGATIVE
Bacteria, UA: NONE SEEN
GLUCOSE, UA: NEGATIVE mg/dL
Ketones, ur: 20 mg/dL — AB
NITRITE: NEGATIVE
PH: 6 (ref 5.0–8.0)
Protein, ur: 30 mg/dL — AB
SPECIFIC GRAVITY, URINE: 1.026 (ref 1.005–1.030)

## 2017-03-04 LAB — LIPASE, BLOOD: LIPASE: 12 U/L (ref 11–51)

## 2017-03-04 MED ORDER — HYDROCODONE-ACETAMINOPHEN 5-325 MG PO TABS: 1.0000 | ORAL_TABLET | Freq: Three times a day (TID) | ORAL | 0 refills | Status: AC | PRN

## 2017-03-04 MED ORDER — HYDROCODONE-ACETAMINOPHEN 5-325 MG PO TABS
1.0000 | ORAL_TABLET | Freq: Once | ORAL | Status: AC
  Administered 2017-03-04: 1 via ORAL
  Filled 2017-03-04: qty 1

## 2017-03-04 MED ORDER — TAMSULOSIN HCL 0.4 MG PO CAPS: 0.4000 mg | ORAL_CAPSULE | Freq: Every day | ORAL | 0 refills | Status: AC

## 2017-03-04 MED ORDER — TAMSULOSIN HCL 0.4 MG PO CAPS
0.4000 mg | ORAL_CAPSULE | Freq: Once | ORAL | Status: AC
  Administered 2017-03-04: 0.4 mg via ORAL
  Filled 2017-03-04: qty 1

## 2017-03-04 MED ORDER — MORPHINE SULFATE (PF) 4 MG/ML IV SOLN
4.0000 mg | Freq: Once | INTRAVENOUS | Status: AC
  Administered 2017-03-04: 4 mg via INTRAVENOUS
  Filled 2017-03-04: qty 1

## 2017-03-04 NOTE — ED Triage Notes (Signed)
Pt complains of flank pain since this am, he states that his urine is really dark and the pain is getting worse this evening

## 2017-03-04 NOTE — ED Provider Notes (Signed)
[facility] DEPT Provider Note   CSN: [id number] Arrival date & time: 03/04/17  1813     History   Chief Complaint Chief Complaint  Patient presents with  . Flank Pain    HPI Paul Clayton is a 71 y.o. male.  The history is provided by the patient.  Flank Pain  This is a new problem. The current episode started 6 to 12 hours ago. The problem occurs constantly. Progression since onset: fluctuating. Associated symptoms include abdominal pain. Pertinent negatives include no chest pain, no headaches and no shortness of breath. Nothing aggravates the symptoms. Nothing relieves the symptoms. Treatments tried: naproxen. The treatment provided no relief.   1 week since he last used cocaine. No other illicit drug use.  Past Medical History:  Diagnosis Date  . Adenocarcinoma of colon ([place])    STAGE 1  . Anxiety   . Arthritis    "left hip" (04/19/2015)  . CAD (coronary artery disease)    a. LHC 1/16:  LM 20%, oLAD 30%, oLCx 30%, pRCA 30%  . Chronic lower back pain   . Chronic systolic CHF (congestive heart failure) (HCC)    a. EF previously 15%; b. Echo 7/16:  EF 30-35%, diff HK, gr 1 DD, mild MR, mild LAE, mild reduced RVSF  . COPD (chronic obstructive pulmonary disease) ([place])   . Depression   . GERD (gastroesophageal reflux disease)   . Hepatitis C   . Hypertension   . Migraine    "last one was back in the 1980's" (04/19/2015)  . NICM (nonischemic cardiomyopathy) ([place])    no obs CAD on LHC in 1/16 - ? HTN or substance abuse  . Persistent atrial fibrillation (HCC)    a. s/p DCCV; b. Amiodarone  . Substance abuse    ETOH; crack cocaine  . Tobacco abuse     Patient Active Problem List   Diagnosis Date Noted  . Syncope 01/05/2017  . Polysubstance abuse   . Alcohol abuse   . Sciatica of left side   . Syncope and collapse   . PAF (paroxysmal atrial fibrillation) ([place])   . Non-ischemic cardiomyopathy ([place])   . Health care maintenance 06/20/2016  . Urinary incontinence  05/03/2015  . LBP radiating to left leg 03/12/2015  . Nonischemic cardiomyopathy ([place]) 03/05/2015  . Hx of substance abuse 10/30/2014  . Faintness   . Homeless single person   . Atrial fibrillation with RVR ([place]) 10/13/2014  . High risk social situation 09/30/2014  . Colon cancer ([place]) 09/26/2014  . HFrEF (heart failure with reduced ejection fraction) ([place]) 09/12/2014  . A-fib ([place]) 09/12/2014  . COPD (chronic obstructive pulmonary disease) ([place]) 09/12/2014    Past Surgical History:  Procedure Laterality Date  . CARDIAC CATHETERIZATION  10/23/2014  . CARDIOVERSION     "I've had 2 in all" (04/19/2015)  . CARDIOVERSION N/A 01/30/2015   Procedure: CARDIOVERSION;  Surgeon: Larey Dresser, MD;  Location: [facility];  Service: Cardiovascular;  Laterality: N/A;  . COLONOSCOPY    . COLONOSCOPY WITH PROPOFOL N/A 11/30/2014   Procedure: COLONOSCOPY WITH PROPOFOL;  Surgeon: Milus Banister, MD;  Location: [facility] ENDOSCOPY;  Service: Endoscopy;  Laterality: N/A;  . LEFT AND RIGHT HEART CATHETERIZATION WITH CORONARY ANGIOGRAM N/A 10/23/2014   Procedure: LEFT AND RIGHT HEART CATHETERIZATION WITH CORONARY ANGIOGRAM;  Surgeon: Larey Dresser, MD;  Location[facility] C[facility] LAB;  Service: Cardiovascular;  Laterality: N/A;       Home Medications    Prior to Admission medications  Medication Sig Start Date End Date Taking? Authorizing Provider  albuterol-ipratropium (COMBIVENT) 18-103 MCG/ACT inhaler Inhale 2 puffs into the lungs every 4 (four) hours as needed for wheezing or shortness of breath. 08/25/16  Yes Street, [place], PA-C  carvedilol (COREG) 6.25 MG tablet TAKE 1 TABLET (6.25 MG TOTAL) BY MOUTH TWO TIMES DAILY WITH A MEAL. 10/14/16  Yes Larey Dresser, MD  losartan (COZAAR) 25 MG tablet TAKE 1 TABLET (25 MG TOTAL) BY MOUTH DAILY. 11/04/16  Yes Larey Dresser, MD  methocarbamol (ROBAXIN) 500 MG tablet Take 1 tablet (500 mg total) by mouth 2 (two) times daily. Patient taking differently: Take 500 mg  by mouth every 8 (eight) hours as needed for muscle spasms.  12/15/16  Yes Nona Dell, PA-C  pregabalin (LYRICA) 50 MG capsule Take 1 capsule (50 mg total) by mouth 2 (two) times daily. 11/01/16  Yes Carmin Muskrat, MD  rivaroxaban (XARELTO) 20 MG TABS tablet Take 1 tablet (20 mg total) by mouth daily. 12/04/16  Yes Larey Dresser, MD  spironolactone (ALDACTONE) 25 MG tablet Take 1 tablet (25 mg total) by mouth daily. 08/26/16  Yes Clegg, Amy D, NP  amiodarone (PACERONE) 200 MG tablet Take 1 tablet (200 mg total) by mouth daily. 08/26/16   Clegg, Amy D, NP  atorvastatin (LIPITOR) 20 MG tablet Take 1 tablet (20 mg total) by mouth daily. 08/26/16   Clegg, Amy D, NP  furosemide (LASIX) 40 MG tablet Take 40 mg by mouth daily.    [provider]  HYDROcodone-acetaminophen (NORCO/VICODIN) 5-325 MG tablet Take 1 tablet by mouth every 8 (eight) hours as needed for severe pain (That is not improved by your scheduled acetaminophen regimen). Please do not exceed 4000 mg of acetaminophen (Tylenol) a 24-hour period. Please note that he may be prescribed additional medicine that contains acetaminophen. 03/04/17 03/09/17  Fatima Blank, MD  lidocaine (LIDODERM) 5 % Place 1 patch onto the skin daily. Remove & Discard patch within 12 hours or as directed by MD Patient taking differently: Place 1 patch onto the skin daily as needed (pain). Remove & Discard patch within 12 hours or as directed by MD 06/20/16   Mayo, Pete Pelt, MD  predniSONE (DELTASONE) 20 MG tablet Take 2 tablets (40 mg total) by mouth daily. Patient not taking: Reported on 03/04/2017 12/15/16   Nona Dell, PA-C  tamsulosin (FLOMAX) 0.4 MG CAPS capsule Take 1 capsule (0.4 mg total) by mouth daily. until you pass the kidney stone 03/04/17 03/18/17  Laury Huizar, Grayce Sessions, MD  topiramate (TOPAMAX) 25 MG capsule Take 25 mg by mouth 2 (two) times daily.    [provider]    Family History Family History    Problem Relation Age of Onset  . Hypertension Mother     Social History Social History  Substance Use Topics  . Smoking status: Current Every Day Smoker    Packs/day: 0.10    Years: 52.00    Types: Cigarettes    Last attempt to quit: 09/01/2014  . Smokeless tobacco: Never Used     Comment: 06/20/16-started back smoking; pack lasts 1 mo; wants to quit again  . Alcohol use 0.0 oz/week     Comment: 04/19/2015 "I might drink a 40oz beer/month"     Allergies   Patient has no known allergies.   Review of Systems Review of Systems  Respiratory: Negative for shortness of breath.   Cardiovascular: Negative for chest pain.  Gastrointestinal: Positive for abdominal pain.  Genitourinary: Positive for flank pain and hematuria. Negative for dysuria and frequency.  Neurological: Negative for headaches.   All other systems are reviewed and are negative for acute change except as noted in the HPI   Physical Exam Updated Vital Signs BP (!) 139/98 (BP Location: Left Arm)   Pulse 96   Temp 98.1 F (36.7 C) (Oral)   Resp 18   SpO2 100%   Physical Exam  Constitutional: He is oriented to person, place, and time. He appears well-developed and well-nourished. No distress.  HENT:  Head: Normocephalic and atraumatic.  Nose: Nose normal.  Eyes: Conjunctivae and EOM are normal. Pupils are equal, round, and reactive to light. Right eye exhibits no discharge. Left eye exhibits no discharge. No scleral icterus.  Neck: Normal range of motion. Neck supple.  Cardiovascular: Normal rate and regular rhythm.  Exam reveals no gallop and no friction rub.   No murmur heard. Pulmonary/Chest: Effort normal and breath sounds normal. No stridor. No respiratory distress. He has no rales.  Abdominal: Soft. He exhibits no distension. There is tenderness in the right upper quadrant. There is CVA tenderness (right). There is no rigidity, no rebound, no guarding and negative Murphy's sign.  Musculoskeletal: He  exhibits no edema or tenderness.  Neurological: He is alert and oriented to person, place, and time.  Skin: Skin is warm and dry. No rash noted. He is not diaphoretic. No erythema.  Psychiatric: He has a normal mood and affect.  Vitals reviewed.    ED Treatments / Results  Labs (all labs ordered are listed, but only abnormal results are displayed) Labs Reviewed  CBC WITH DIFFERENTIAL/PLATELET - Abnormal; Notable for the following:       Result Value   MCH 34.3 (*)    Monocytes Absolute 1.1 (*)    All other components within normal limits  COMPREHENSIVE METABOLIC PANEL - Abnormal; Notable for the following:    Creatinine, Ser 1.30 (*)    Total Bilirubin 1.4 (*)    GFR calc non Af Amer 54 (*)    All other components within normal limits  URINALYSIS, ROUTINE W REFLEX MICROSCOPIC - Abnormal; Notable for the following:    Color, Urine AMBER (*)    APPearance HAZY (*)    Hgb urine dipstick LARGE (*)    Ketones, ur 20 (*)    Protein, ur 30 (*)    Leukocytes, UA MODERATE (*)    Squamous Epithelial / LPF 0-5 (*)    All other components within normal limits  LIPASE, BLOOD    EKG  EKG Interpretation None       Radiology Ct Renal Stone Study  Result Date: 03/04/2017 CLINICAL DATA:  Right flank pain with dark urine EXAM: CT ABDOMEN AND PELVIS WITHOUT CONTRAST TECHNIQUE: Multidetector CT imaging of the abdomen and pelvis was performed following the standard protocol without IV contrast. COMPARISON:  10/10/2014 FINDINGS: Lower chest: Lung bases demonstrate no acute consolidation or pleural effusion. Borderline to mild cardiomegaly. Mild coronary artery calcification. Hepatobiliary: No focal liver abnormality is seen. No gallstones, gallbladder wall thickening, or biliary dilatation. Pancreas: Unremarkable. No pancreatic ductal dilatation or surrounding inflammatory changes. Spleen: Calcified granuloma. Adrenals/Urinary Tract: Adrenal glands are within normal limits. No left  hydronephrosis. Moderate right perinephric fat stranding. Moderate right hydronephrosis and hydroureter, secondary to a 5 mm stone in the distal right ureter at the UVJ. Bladder normal. Stomach/Bowel: Stomach is within normal limits. Appendix appears normal. No evidence of bowel wall thickening, distention, or inflammatory changes.  Vascular/Lymphatic: Aortic atherosclerosis. No enlarged abdominal or pelvic lymph nodes. Reproductive: Enlarged prostate with calcifications. Other: No free air or free fluid. Musculoskeletal: No acute or suspicious bone lesion. Degenerative changes, most notable at L5-S1. IMPRESSION: 1. Moderate right hydronephrosis and hydroureter with perinephric fat stranding, this is secondary to a 5 mm stone in the distal right ureter at the right UVJ. 2. Negative for left nephrolithiasis or hydronephrosis Electronically Signed   By: Donavan Foil M.D.   On: 03/04/2017 20:20    Procedures Procedures (including critical care time) EMERGENCY DEPARTMENT ULTRASOUND  Study: Limited Retroperitoneal Ultrasound of the Abdominal Aorta.  INDICATIONS:Back pain and Age>55 Multiple views of the abdominal aorta were obtained in real-time from the diaphragmatic hiatus to the aortic bifurcation in transverse planes with a multi-frequency probe.  PERFORMED BY: Myself IMAGES ARCHIVED?: Yes LIMITATIONS:  Bowel gas INTERPRETATION:  No abdominal aortic aneurysm    EMERGENCY DEPARTMENT BILIARY ULTRASOUND INTERPRETATION "Study: Limited Abdominal Ultrasound of the Gallbladder and Common Bile Duct."  INDICATIONS: Back pain Indication: Multiple views of the gallbladder and common bile duct were obtained in real-time with a Multi-frequency probe."  PERFORMED BY:  Myself IMAGES ARCHIVED?: Yes LIMITATIONS: Bowel gas INTERPRETATION: Normal, Gallbladder wall normal in thickness, Sonographic Murphy's sign abscent and Common bile duct normal in size  EMERGENCY DEPARTMENT US RENAL EXAM  "Study: Limited  Retroperitoneal Ultrasound of Kidneys"  INDICATIONS: Flank pain and Back pain Long and short axis of both kidneys were obtained.   PERFORMED BY: Myself IMAGES ARCHIVED?: Yes LIMITATIONS: None VIEWS USED: Long axis and Short axis  INTERPRETATION: Right Hydronephrosis moderate, No Renal cyst, No Kidney stone     Medications Ordered in ED Medications  tamsulosin (FLOMAX) capsule 0.4 mg (not administered)  HYDROcodone-acetaminophen (NORCO/VICODIN) 5-325 MG per tablet 1 tablet (not administered)  morphine 4 MG/ML injection 4 mg (4 mg Intravenous Given 03/04/17 1939)     Initial Impression / Assessment and Plan / ED Course  I have reviewed the triage vital signs and the nursing notes.  Pertinent labs & imaging results that were available during my care of the patient were reviewed by me and considered in my medical decision making (see chart for details).  Clinical Course as of Mar 04 2045  Wed Mar 04, 2017  1951 Bedside ultrasound without evidence of AAA or gallbladder disease. He did note right hydronephrosis. Given the history of hematuria will obtain a CT scan to assess for possible kidney stone.    [PC]  2037 CT confirming 5 mm stone in the right UVJ with moderate hydronephrosis previously seen on the bedside ultrasound. UA without evidence of infection. Patient with mild renal insufficiency likely secondary to the hydronephrosis. Pain controlled with morphine. The patient is appropriate for discharge with strict return precautions and close PCP follow-up. We'll also have patient follow-up with urology.  [PC]    Clinical Course User Index [PC] Kandra Graven, Grayce Sessions, MD      Final Clinical Impressions(s) / ED Diagnoses   Final diagnoses:  Right ureteral stone  AKI (acute kidney injury) ([place])   Disposition: Discharge  Condition: Good  I have discussed the results, Dx and Tx plan with the patient who expressed understanding and agree(s) with the plan. Discharge  instructions discussed at great length. The patient was given strict return precautions who verbalized understanding of the instructions. No further questions at time of discharge.    New Prescriptions   HYDROCODONE-ACETAMINOPHEN (NORCO/VICODIN) 5-325 MG TABLET    Take 1 tablet by mouth every  8 (eight) hours as needed for severe pain (That is not improved by your scheduled acetaminophen regimen). Please do not exceed 4000 mg of acetaminophen (Tylenol) a 24-hour period. Please note that he may be prescribed additional medicine that contains acetaminophen.   TAMSULOSIN (FLOMAX) 0.4 MG CAPS CAPSULE    Take 1 capsule (0.4 mg total) by mouth daily. until you pass the kidney stone    Follow Up: Larey Dresser, MD [address] [place] [address] [phone]  In 1 week For close follow up to recheck kidney function  Jonny Ruiz, Joesph Fillers, MD [address] [place] [address] [phone]  Call  to schedule appointment for kidney stone      Leonette Monarch Grayce Sessions, MD 03/04/17 2046

## 2017-04-06 ENCOUNTER — Encounter (HOSPITAL_COMMUNITY): Payer: Self-pay

## 2017-04-06 ENCOUNTER — Emergency Department (HOSPITAL_COMMUNITY): Payer: Medicare Other

## 2017-04-06 ENCOUNTER — Emergency Department (HOSPITAL_COMMUNITY)
Admission: EM | Admit: 2017-04-06 | Discharge: 2017-04-06 | Disposition: A | Discharge status: Home / Self Care | Payer: Medicare Other | Attending: Emergency Medicine | Admitting: Emergency Medicine

## 2017-04-06 DIAGNOSIS — I2510 Atherosclerotic heart disease of native coronary artery without angina pectoris: Secondary | ICD-10-CM | POA: Insufficient documentation

## 2017-04-06 DIAGNOSIS — I110 Hypertensive heart disease with heart failure: Secondary | ICD-10-CM | POA: Insufficient documentation

## 2017-04-06 DIAGNOSIS — Z7901 Long term (current) use of anticoagulants: Secondary | ICD-10-CM | POA: Diagnosis not present

## 2017-04-06 DIAGNOSIS — M5442 Lumbago with sciatica, left side: Secondary | ICD-10-CM | POA: Diagnosis not present

## 2017-04-06 DIAGNOSIS — M5432 Sciatica, left side: Secondary | ICD-10-CM | POA: Diagnosis not present

## 2017-04-06 DIAGNOSIS — J449 Chronic obstructive pulmonary disease, unspecified: Secondary | ICD-10-CM | POA: Diagnosis not present

## 2017-04-06 DIAGNOSIS — F17210 Nicotine dependence, cigarettes, uncomplicated: Secondary | ICD-10-CM | POA: Diagnosis not present

## 2017-04-06 DIAGNOSIS — R079 Chest pain, unspecified: Secondary | ICD-10-CM | POA: Insufficient documentation

## 2017-04-06 DIAGNOSIS — Z79899 Other long term (current) drug therapy: Secondary | ICD-10-CM | POA: Insufficient documentation

## 2017-04-06 DIAGNOSIS — Z85038 Personal history of other malignant neoplasm of large intestine: Secondary | ICD-10-CM | POA: Insufficient documentation

## 2017-04-06 DIAGNOSIS — I5022 Chronic systolic (congestive) heart failure: Secondary | ICD-10-CM | POA: Diagnosis not present

## 2017-04-06 LAB — CBC
HCT: 43.1 % (ref 39.0–52.0)
HEMOGLOBIN: 14.6 g/dL (ref 13.0–17.0)
MCH: 33.3 pg (ref 26.0–34.0)
MCHC: 33.9 g/dL (ref 30.0–36.0)
MCV: 98.4 fL (ref 78.0–100.0)
PLATELETS: 191 10*3/uL (ref 150–400)
RBC: 4.38 MIL/uL (ref 4.22–5.81)
RDW: 12 % (ref 11.5–15.5)
WBC: 7.5 10*3/uL (ref 4.0–10.5)

## 2017-04-06 LAB — BASIC METABOLIC PANEL
Anion gap: 5 (ref 5–15)
BUN: 10 mg/dL (ref 6–20)
CALCIUM: 9.7 mg/dL (ref 8.9–10.3)
CO2: 28 mmol/L (ref 22–32)
CREATININE: 1.08 mg/dL (ref 0.61–1.24)
Chloride: 109 mmol/L (ref 101–111)
GFR calc Af Amer: >60 mL/min (ref >60)
GFR calc non Af Amer: >60 mL/min (ref >60)
GLUCOSE: 117 mg/dL — AB (ref 65–99)
Potassium: 4.6 mmol/L (ref 3.5–5.1)
Sodium: 142 mmol/L (ref 135–145)

## 2017-04-06 LAB — PROTIME-INR
INR: 1.02
Prothrombin Time: 13.5 seconds (ref 11.4–15.2)

## 2017-04-06 LAB — I-STAT TROPONIN, ED: Troponin i, poc: 0.01 ng/mL (ref 0.00–0.08)

## 2017-04-06 LAB — BRAIN NATRIURETIC PEPTIDE: B NATRIURETIC PEPTIDE 5: 270.2 pg/mL — AB (ref 0.0–100.0)

## 2017-04-06 LAB — D-DIMER, QUANTITATIVE (NOT AT ARMC): D DIMER QUANT: 0.35 ug{FEU}/mL (ref 0.00–0.50)

## 2017-04-06 MED ORDER — HYDROCODONE-ACETAMINOPHEN 5-325 MG PO TABS
1.0000 | ORAL_TABLET | ORAL | 0 refills | Status: DC | PRN
Start: 2017-04-06 — End: 2017-08-16

## 2017-04-06 MED ORDER — PROTHROMBIN COMPLEX CONC HUMAN 500 UNITS IV KIT: 50.0000 [IU]/kg | PACK | INTRAVENOUS | Status: DC

## 2017-04-06 MED ORDER — HYDROCODONE-ACETAMINOPHEN 5-325 MG PO TABS
1.0000 | ORAL_TABLET | Freq: Once | ORAL | Status: AC
  Administered 2017-04-06: 1 via ORAL
  Filled 2017-04-06: qty 1

## 2017-04-06 NOTE — Discharge Instructions (Signed)
Follow-up with your primary care doctor as soon as possible for further care and treatment 

## 2017-04-06 NOTE — ED Triage Notes (Signed)
Patient complains of chest pain and shorttness of breath that started this am at 0730. Reports that he has CHF and thinks it may be related. Speaking complete sentences and no edema noted
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
## 2017-04-10 ENCOUNTER — Emergency Department (HOSPITAL_COMMUNITY)
Admission: EM | Admit: 2017-04-10 | Discharge: 2017-04-10 | Discharge status: Against Medical Advice | Payer: Medicare Other

## 2017-04-10 NOTE — ED Notes (Signed)
Pt called 3 times 10-15 mins apart for triage without answer

## 2017-04-10 NOTE — ED Notes (Signed)
Called several times no answer. 

## 2017-04-13 ENCOUNTER — Ambulatory Visit (HOSPITAL_COMMUNITY)
Admission: RE | Admit: 2017-04-13 | Discharge: 2017-04-13 | Disposition: A | Discharge status: Home / Self Care | Payer: Medicare Other | Source: Ambulatory Visit | Attending: Cardiology | Admitting: Cardiology

## 2017-04-13 ENCOUNTER — Encounter (HOSPITAL_COMMUNITY): Payer: Self-pay

## 2017-04-13 VITALS — BP 128/90 | HR 77 | Wt 194.0 lb

## 2017-04-13 DIAGNOSIS — E785 Hyperlipidemia, unspecified: Secondary | ICD-10-CM | POA: Insufficient documentation

## 2017-04-13 DIAGNOSIS — F1010 Alcohol abuse, uncomplicated: Secondary | ICD-10-CM

## 2017-04-13 DIAGNOSIS — I480 Paroxysmal atrial fibrillation: Secondary | ICD-10-CM

## 2017-04-13 DIAGNOSIS — C189 Malignant neoplasm of colon, unspecified: Secondary | ICD-10-CM | POA: Insufficient documentation

## 2017-04-13 DIAGNOSIS — J438 Other emphysema: Secondary | ICD-10-CM

## 2017-04-13 DIAGNOSIS — R55 Syncope and collapse: Secondary | ICD-10-CM | POA: Insufficient documentation

## 2017-04-13 DIAGNOSIS — I428 Other cardiomyopathies: Secondary | ICD-10-CM | POA: Diagnosis not present

## 2017-04-13 DIAGNOSIS — K219 Gastro-esophageal reflux disease without esophagitis: Secondary | ICD-10-CM | POA: Insufficient documentation

## 2017-04-13 DIAGNOSIS — F17210 Nicotine dependence, cigarettes, uncomplicated: Secondary | ICD-10-CM | POA: Diagnosis not present

## 2017-04-13 DIAGNOSIS — I5022 Chronic systolic (congestive) heart failure: Secondary | ICD-10-CM | POA: Diagnosis not present

## 2017-04-13 DIAGNOSIS — Z609 Problem related to social environment, unspecified: Secondary | ICD-10-CM

## 2017-04-13 DIAGNOSIS — J449 Chronic obstructive pulmonary disease, unspecified: Secondary | ICD-10-CM | POA: Diagnosis not present

## 2017-04-13 DIAGNOSIS — I2510 Atherosclerotic heart disease of native coronary artery without angina pectoris: Secondary | ICD-10-CM | POA: Diagnosis not present

## 2017-04-13 DIAGNOSIS — F1910 Other psychoactive substance abuse, uncomplicated: Secondary | ICD-10-CM | POA: Diagnosis not present

## 2017-04-13 DIAGNOSIS — Z7901 Long term (current) use of anticoagulants: Secondary | ICD-10-CM | POA: Insufficient documentation

## 2017-04-13 DIAGNOSIS — Z9114 Patient's other noncompliance with medication regimen: Secondary | ICD-10-CM | POA: Insufficient documentation

## 2017-04-13 DIAGNOSIS — I429 Cardiomyopathy, unspecified: Secondary | ICD-10-CM | POA: Insufficient documentation

## 2017-04-13 LAB — COMPREHENSIVE METABOLIC PANEL
ALK PHOS: 71 U/L (ref 38–126)
ALT: 27 U/L (ref 17–63)
ANION GAP: 4 — AB (ref 5–15)
AST: 32 U/L (ref 15–41)
Albumin: 3.7 g/dL (ref 3.5–5.0)
BILIRUBIN TOTAL: 0.9 mg/dL (ref 0.3–1.2)
BUN: 12 mg/dL (ref 6–20)
CALCIUM: 9.3 mg/dL (ref 8.9–10.3)
CO2: 29 mmol/L (ref 22–32)
Chloride: 107 mmol/L (ref 101–111)
Creatinine, Ser: 0.87 mg/dL (ref 0.61–1.24)
GFR calc non Af Amer: >60 mL/min (ref >60)
Glucose, Bld: 98 mg/dL (ref 65–99)
Potassium: 4.6 mmol/L (ref 3.5–5.1)
SODIUM: 140 mmol/L (ref 135–145)
TOTAL PROTEIN: 6.8 g/dL (ref 6.5–8.1)

## 2017-04-13 LAB — CBC
HCT: 40.8 % (ref 39.0–52.0)
HEMOGLOBIN: 14.1 g/dL (ref 13.0–17.0)
MCH: 33.1 pg (ref 26.0–34.0)
MCHC: 34.6 g/dL (ref 30.0–36.0)
MCV: 95.8 fL (ref 78.0–100.0)
Platelets: 173 10*3/uL (ref 150–400)
RBC: 4.26 MIL/uL (ref 4.22–5.81)
RDW: 11.9 % (ref 11.5–15.5)
WBC: 7 10*3/uL (ref 4.0–10.5)

## 2017-04-13 LAB — BRAIN NATRIURETIC PEPTIDE: B Natriuretic Peptide: 160.8 pg/mL — ABNORMAL HIGH (ref 0.0–100.0)

## 2017-04-13 LAB — TSH: TSH: 0.602 u[IU]/mL (ref 0.350–4.500)

## 2017-04-13 MED ORDER — LOSARTAN POTASSIUM 25 MG PO TABS: 25.0000 mg | ORAL_TABLET | Freq: Every day | ORAL | 3 refills | Status: DC

## 2017-04-13 MED ORDER — CARVEDILOL 3.125 MG PO TABS: 3.1250 mg | ORAL_TABLET | Freq: Two times a day (BID) | ORAL | 6 refills | Status: DC

## 2017-04-13 MED ORDER — AMIODARONE HCL 200 MG PO TABS: 200.0000 mg | ORAL_TABLET | Freq: Every day | ORAL | 3 refills | Status: DC

## 2017-04-13 NOTE — Patient Instructions (Signed)
Routine lab work today. Will notify you of abnormal results, otherwise no news is good news!  START Carvedilol (Coreg) 3.125 mg tablet twice daily.  START Losartan 25 mg tablet once daily.  START Amiodarone 200 mg tablet once daily.  Follow up 4 weeks with echocardiogram and appointment with Oda Kilts PA-C. Take all medication as prescribed the day of your appointment. Bring all medications with you to your appointment.  Do the following things EVERYDAY: 1) Weigh yourself in the morning before breakfast. Write it down and keep it in a log. 2) Take your medicines as prescribed 3) Eat low salt foods-Limit salt (sodium) to 2000 mg per day.  4) Stay as active as you can everyday 5) Limit all fluids for the day to less than 2 liters
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
## 2017-05-20 ENCOUNTER — Encounter (HOSPITAL_COMMUNITY): Payer: Medicare Other

## 2017-05-20 ENCOUNTER — Ambulatory Visit (HOSPITAL_COMMUNITY): Payer: Medicare Other

## 2017-06-02 ENCOUNTER — Encounter (HOSPITAL_COMMUNITY): Payer: Medicare Other

## 2017-06-02 ENCOUNTER — Ambulatory Visit (HOSPITAL_BASED_OUTPATIENT_CLINIC_OR_DEPARTMENT_OTHER)
Admission: RE | Admit: 2017-06-02 | Discharge: 2017-06-02 | Disposition: A | Discharge status: Home / Self Care | Payer: Medicare Other | Source: Ambulatory Visit | Attending: Cardiology | Admitting: Cardiology

## 2017-06-02 ENCOUNTER — Ambulatory Visit (HOSPITAL_COMMUNITY)
Admission: RE | Admit: 2017-06-02 | Discharge: 2017-06-02 | Disposition: A | Discharge status: Home / Self Care | Payer: Medicare Other | Source: Ambulatory Visit | Attending: Student | Admitting: Student

## 2017-06-02 VITALS — BP 142/82 | HR 63 | Wt 190.0 lb

## 2017-06-02 DIAGNOSIS — K219 Gastro-esophageal reflux disease without esophagitis: Secondary | ICD-10-CM | POA: Diagnosis not present

## 2017-06-02 DIAGNOSIS — I480 Paroxysmal atrial fibrillation: Secondary | ICD-10-CM | POA: Diagnosis not present

## 2017-06-02 DIAGNOSIS — I5022 Chronic systolic (congestive) heart failure: Secondary | ICD-10-CM | POA: Diagnosis not present

## 2017-06-02 DIAGNOSIS — I429 Cardiomyopathy, unspecified: Secondary | ICD-10-CM | POA: Diagnosis not present

## 2017-06-02 DIAGNOSIS — B1920 Unspecified viral hepatitis C without hepatic coma: Secondary | ICD-10-CM | POA: Insufficient documentation

## 2017-06-02 DIAGNOSIS — Z79899 Other long term (current) drug therapy: Secondary | ICD-10-CM | POA: Diagnosis not present

## 2017-06-02 DIAGNOSIS — Z7901 Long term (current) use of anticoagulants: Secondary | ICD-10-CM | POA: Insufficient documentation

## 2017-06-02 DIAGNOSIS — F1910 Other psychoactive substance abuse, uncomplicated: Secondary | ICD-10-CM

## 2017-06-02 DIAGNOSIS — J449 Chronic obstructive pulmonary disease, unspecified: Secondary | ICD-10-CM | POA: Insufficient documentation

## 2017-06-02 DIAGNOSIS — Z85038 Personal history of other malignant neoplasm of large intestine: Secondary | ICD-10-CM | POA: Diagnosis not present

## 2017-06-02 DIAGNOSIS — Z9114 Patient's other noncompliance with medication regimen: Secondary | ICD-10-CM | POA: Diagnosis not present

## 2017-06-02 DIAGNOSIS — F17210 Nicotine dependence, cigarettes, uncomplicated: Secondary | ICD-10-CM | POA: Diagnosis not present

## 2017-06-02 DIAGNOSIS — E785 Hyperlipidemia, unspecified: Secondary | ICD-10-CM | POA: Diagnosis not present

## 2017-06-02 DIAGNOSIS — F1410 Cocaine abuse, uncomplicated: Secondary | ICD-10-CM | POA: Diagnosis not present

## 2017-06-02 DIAGNOSIS — Z609 Problem related to social environment, unspecified: Secondary | ICD-10-CM | POA: Diagnosis not present

## 2017-06-02 DIAGNOSIS — I428 Other cardiomyopathies: Secondary | ICD-10-CM | POA: Diagnosis not present

## 2017-06-02 NOTE — Progress Notes (Signed)
  Echocardiogram 2D Echocardiogram has been performed.  Paul Clayton 06/02/2017, 10:49 AM

## 2017-06-02 NOTE — Progress Notes (Signed)
Patient ID: Paul Clayton, male   DOB: [birth year], 72 y.o.   MRN: [id number]     Advanced Heart Failure Clinic Note   PCP: Dr. Brett Albino Cardiology: Dr. Aundra Dubin  71 yo with history of COPD, ETOH and cocaine abuse, HCV, colon cancer, paroxysmal atrial fibrillation, and chronic systolic CHF.  He says that he has had a cardiomyopathy known since 2012. This has been presumed to be due to cocaine and ETOH abuse. He says that he quit ETOH, cocaine, and smoking in 10/15.  He was admitted to [facility] in 12/15 with atrial fibrillation and RVR. He had been out of his Coreg x 2 weeks.  He was diuresed and rate-controlled.  He had had an episode of syncope prior to admission that was thought to be orthostatic given low BP in the setting of atrial fibrillation with RVR.  Echo in 11/15 showed EF 15% with severe MR and TR and moderately decreased RV systolic function.    He had a colonoscopy in 11/15 with a mass found, biopsy showed adenocarcinoma.  Most recent GI evaluation suggests that he will not need colectomy.   LHC/RHC in 1/16 showed no obstructive CAD and mildly elevated filling pressures.  Lasix was increased to 40 mg daily. He was admitted earlier in 1/16 with syncope, possibly defecation syncope.  He wore a Lifevest for a period of time but no longer (ICD not placed given episodes of cocaine use ongoing).  In 4/16, he underwent DCCV to NSR.  He was bradycardic, so Coreg was stopped.    He was admitted in 7/16 with cocaine-related chest pain.    Today he returns for HF follow up in shackles with a prison guard. He has been in jail the last 45 days. Medications provided by nurse in jail. Over all feeling ok. Denies SOB/PND/Orthopnea.last uses cocaine 45 days ago. All meals provided by [facility]. Receiving a heart healthy diet in jail.   Labs (12/15): K 4.3, creatinine 0.82, AST normal, ALT 60, HCT 40.9, digoxin 0.7, BNP 4401, HIV negative Labs (10/28/2014): K 4.5 Creatinine 0.90, digoxin 0.9,  LFTs normal  Labs (1/16): K 4.3, creatinine 1.11, HCT 41.5, BNP 255, digoxin 0.6 Labs (12/06/2014): K 4.3 Creatinine 1.18  Labs (4/16): AST 53, ALT 60 Labs (5/16): K 4.3, creatinine 1.17, digoxin 0.7, HCT 36.6, BNP 97.5 Labs (7/16): AST 46, ALT 49, TSH normal Labs (9/16): K 4.7, creatinine 0.93, HCT 39.7 Labs (2/17): K 5, creatinine 0.85, HCT 58.8 Labs (06/25/2016): K 4.3 Creatinine 0.89 BNP 65   PMH: 1. COPD: Quit smoking in 10/15 but now back to smoking a few cigarettes/day.  2. Cocaine abuse: Still uses occasionally.   3. ETOH abuse: Quit ETOH in 10/15.  4. Atrial fibrillation: Paroxysmal.  Reports DCCV x 2 in [place].  DCCV 4/16 => bradycardic => cut back Coreg.  5. HCV 6. Colon cancer: Colonoscopy in [place] with removal of polyp showing colonic adenocarcinoma on pathology.  PET scan in 12/15: probably no metastatic disease.   7. H/o syncope: Thought to be orthostatic. Also had vagal-type syncope associated with defecation.  8. Cardiomyopathy: Known since 2012.  Echo (11/15) with EF 15%, diffuse hypokinesis, severe LV dilation, severe MR (likely functional), moderate to severe LAE, dilated RV with moderately decreased systolic function, severe TR.  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  HIV negative.  LHC/RHC (1/16) with nonobstructive CAD, EF 20-25%, mean RA 9, PA 44/26, mean PCWP 21, CI 2.1.  Echo (7/16) with EF 30-35%, mildly dilated  LV, mildly decreased RV systolic function.   9. GERD 10. Sciatica 11. H/o left femur fracture 12. ECHO EF 25-30%.   SH: Prior ETOH abuse. Still uses cocaine.   Still smokes a few cigarettes/day.  Moved here from [place].  Daughter lives in [place].   FH: No premature CAD.   Review of systems complete and found to be negative unless listed in HPI.    Current Outpatient Prescriptions  Medication Sig Dispense Refill  . albuterol-ipratropium (COMBIVENT) 18-103 MCG/ACT inhaler Inhale 2 puffs into the lungs every 4 (four) hours as needed for wheezing or  shortness of breath. (Patient not taking: Reported on 04/06/2017) 1 Inhaler 0  . amiodarone (PACERONE) 200 MG tablet Take 1 tablet (200 mg total) by mouth daily. 90 tablet 3  . carvedilol (COREG) 3.125 MG tablet Take 1 tablet (3.125 mg total) by mouth 2 (two) times daily with a meal. 60 tablet 6  . HYDROcodone-acetaminophen (NORCO) 5-325 MG tablet Take 1 tablet by mouth every 4 (four) hours as needed. (Patient not taking: Reported on 04/13/2017) 10 tablet 0  . lidocaine (LIDODERM) 5 % Place 1 patch onto the skin daily. Remove & Discard patch within 12 hours or as directed by MD (Patient not taking: Reported on 04/06/2017) 30 patch 0  . losartan (COZAAR) 25 MG tablet Take 1 tablet (25 mg total) by mouth daily. 90 tablet 3  . methocarbamol (ROBAXIN) 500 MG tablet Take 1 tablet (500 mg total) by mouth 2 (two) times daily. (Patient not taking: Reported on 04/06/2017) 20 tablet 0  . pregabalin (LYRICA) 50 MG capsule Take 1 capsule (50 mg total) by mouth 2 (two) times daily. (Patient not taking: Reported on 04/06/2017) 30 capsule 0  . rivaroxaban (XARELTO) 20 MG TABS tablet Take 1 tablet (20 mg total) by mouth daily. 30 tablet 5  . topiramate (TOPAMAX) 25 MG capsule Take 25 mg by mouth 2 (two) times daily.     No current facility-administered medications for this encounter.    Vitals:   06/02/17 1101  BP: (!) 142/82  Pulse: 63  SpO2: 99%  Weight: 190 lb (86.2 kg)     Wt Readings from Last 3 Encounters:  06/02/17 190 lb (86.2 kg)  04/13/17 194 lb (88 kg)  03/04/17 204 lb (92.5 kg)    General:  Well appearing. No resp difficulty. In shackles with a prison guard.  HEENT: normal Neck: supple. no JVD. Carotids 2+ bilat; no bruits. No lymphadenopathy or thryomegaly appreciated. Cor: PMI nondisplaced. Regular rate & rhythm. No rubs, gallops or murmurs. Lungs: clear Abdomen: soft, nontender, nondistended. No hepatosplenomegaly. No bruits or masses. Good bowel sounds. Extremities: no cyanosis, clubbing,  rash, edema Neuro: alert & orientedx3, cranial nerves grossly intact. moves all 4 extremities w/o difficulty. Affect pleasant  Assessment/Plan: 1. Chronic systolic CHF: Cardiomyopathy with LV EF 30-35% and mild RV dysfunction on 7/16 echo. 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  HIV negative.   NYHA II. Todays ECHO was reviewed by Dr Aundra Dubin. EF 30-35%. No ICD with EF improving.  Volume status stable. Does not need lasix.  - Heart rate in the 60s . Continue coreg 3.125 mg twice a day.  -Continue losartan 25 mg every night.  - No spiro for now with non-compliance.  2. Atrial fibrillation: s/p DCCV -regular pulse. -Continue amiodarone 200 daily. LFTs and TSH in June 2018 pk. .     - Continue Xarelto.  Denies bleeding.  3. HCV:  4. COPD:  -Hasnt smoked since  he was incarcerated.   5. Colon cancer:  - Previous with + cancerous polyp removed. PET scan in 2015 negative. Needs to follow up with PCP.  6. Syncope:  - resolved. . 7. Hyperlipidemia:  - Continue atorvastatin. 8. Cocaine Abuse- last used 45 days ago. Needs ongoing counseling after he gets out jail.   I contacted [facility] to discuss medication and verify medications he has been provided.   Greater than 50% of the (total minutes 25) visit spent in counseling/coordination of care regarding substance abuse, smoking abuse, and the medication compliance.   Follow up in November after he is out of jail.     Darrick Grinder, NP  06/02/2017

## 2017-08-16 ENCOUNTER — Emergency Department (HOSPITAL_COMMUNITY)
Admission: EM | Admit: 2017-08-16 | Discharge: 2017-08-16 | Disposition: A | Discharge status: Home / Self Care | Payer: Medicare Other | Attending: Emergency Medicine | Admitting: Emergency Medicine

## 2017-08-16 ENCOUNTER — Encounter (HOSPITAL_COMMUNITY): Payer: Self-pay | Admitting: Emergency Medicine

## 2017-08-16 DIAGNOSIS — I2510 Atherosclerotic heart disease of native coronary artery without angina pectoris: Secondary | ICD-10-CM | POA: Insufficient documentation

## 2017-08-16 DIAGNOSIS — I5022 Chronic systolic (congestive) heart failure: Secondary | ICD-10-CM | POA: Diagnosis not present

## 2017-08-16 DIAGNOSIS — F17210 Nicotine dependence, cigarettes, uncomplicated: Secondary | ICD-10-CM | POA: Diagnosis not present

## 2017-08-16 DIAGNOSIS — Z760 Encounter for issue of repeat prescription: Secondary | ICD-10-CM | POA: Diagnosis not present

## 2017-08-16 DIAGNOSIS — D010 Carcinoma in situ of colon: Secondary | ICD-10-CM | POA: Insufficient documentation

## 2017-08-16 DIAGNOSIS — J449 Chronic obstructive pulmonary disease, unspecified: Secondary | ICD-10-CM | POA: Diagnosis not present

## 2017-08-16 DIAGNOSIS — I110 Hypertensive heart disease with heart failure: Secondary | ICD-10-CM | POA: Insufficient documentation

## 2017-08-16 DIAGNOSIS — M5432 Sciatica, left side: Secondary | ICD-10-CM | POA: Diagnosis not present

## 2017-08-16 DIAGNOSIS — M79605 Pain in left leg: Secondary | ICD-10-CM | POA: Diagnosis present

## 2017-08-16 MED ORDER — LOSARTAN POTASSIUM 25 MG PO TABS
25.0000 mg | ORAL_TABLET | Freq: Once | ORAL | Status: AC
  Administered 2017-08-16: 25 mg via ORAL
  Filled 2017-08-16: qty 1

## 2017-08-16 MED ORDER — RIVAROXABAN 20 MG PO TABS: 20.0000 mg | ORAL_TABLET | Freq: Every day | ORAL | 0 refills | Status: DC

## 2017-08-16 MED ORDER — AMIODARONE HCL 200 MG PO TABS
200.0000 mg | ORAL_TABLET | Freq: Every day | ORAL | Status: DC
  Administered 2017-08-16: 200 mg via ORAL
  Filled 2017-08-16: qty 1

## 2017-08-16 MED ORDER — RIVAROXABAN 20 MG PO TABS
20.0000 mg | ORAL_TABLET | Freq: Once | ORAL | Status: AC
  Administered 2017-08-16: 20 mg via ORAL
  Filled 2017-08-16: qty 1

## 2017-08-16 MED ORDER — CARVEDILOL 3.125 MG PO TABS
3.1250 mg | ORAL_TABLET | Freq: Two times a day (BID) | ORAL | Status: DC
  Administered 2017-08-16: 3.125 mg via ORAL
  Filled 2017-08-16: qty 1

## 2017-08-16 MED ORDER — HYDROCODONE-ACETAMINOPHEN 5-325 MG PO TABS: 2.0000 | ORAL_TABLET | Freq: Four times a day (QID) | ORAL | 0 refills | Status: DC | PRN

## 2017-08-16 MED ORDER — LOSARTAN POTASSIUM 25 MG PO TABS: 25.0000 mg | ORAL_TABLET | Freq: Every day | ORAL | 0 refills | Status: DC

## 2017-08-16 MED ORDER — AMIODARONE HCL 200 MG PO TABS: 200.0000 mg | ORAL_TABLET | Freq: Every day | ORAL | 0 refills | Status: DC

## 2017-08-16 MED ORDER — CARVEDILOL 3.125 MG PO TABS: 3.1250 mg | ORAL_TABLET | Freq: Two times a day (BID) | ORAL | 0 refills | Status: DC

## 2017-08-16 NOTE — ED Triage Notes (Signed)
Pt c/o left leg pain, hx of chronic leg pain and CHF on blood thinner, per pt he just got out of jail today and they misplaced his medication on jail, he needs a medication refill.

## 2017-08-16 NOTE — Discharge Instructions (Signed)
Take all of your medications as prescribed.Take Norco as needed for severe pain but do not drive, drink alcohol, or operate heavy machinery on this medication. Follow-up with your primary care physician on November 14 as scheduled. Return to the ED if any concerning signs or symptoms develop.

## 2017-08-16 NOTE — ED Notes (Signed)
Pt here for medication refill and a check on is sciatica.

## 2017-08-16 NOTE — ED Provider Notes (Signed)
[facility] EMERGENCY DEPARTMENT Provider Note   CSN: [id number] Arrival date & time: 08/16/17  1958     History   Chief Complaint Chief Complaint  Patient presents with  . Leg Pain  . Medication Refill    HPI Paul Clayton is a 71 y.o. male ith history ofsciatica, HTN, persistent A. fib on xarelto, nonischemic cardiomyopathy, chronic systolic CHF, CAD who presents today with chief complaint acute worsening of chronic left-sided sciatica as well as request for medication refill. States that he was released from jail earlier today but was not given any medication refills. He has not had any of his daily medications.states that his usual left-sided sciatica is aggravating him after walking around all day today. He denies numbness, tingling, weakness, fevers, chills, saddle anesthesia, bowel or bladder incontinence, IV drug use, or history of cancer. States that typically Norco controls his sciatica symptoms when he has flares. He has no complaints otherwise. Denies leg swelling, CP, SOB, abd pain, nausea or vomiting.   The history is provided by the patient.    Past Medical History:  Diagnosis Date  . Adenocarcinoma of colon ([place])    STAGE 1  . Anxiety   . Arthritis    "left hip" (04/19/2015)  . CAD (coronary artery disease)    a. LHC 1/16:  LM 20%, oLAD 30%, oLCx 30%, pRCA 30%  . Chronic lower back pain   . Chronic systolic CHF (congestive heart failure) (HCC)    a. EF previously 15%; b. Echo 7/16:  EF 30-35%, diff HK, gr 1 DD, mild MR, mild LAE, mild reduced RVSF  . COPD (chronic obstructive pulmonary disease) ([place])   . Depression   . GERD (gastroesophageal reflux disease)   . Hepatitis C   . Hypertension   . Migraine    "last one was back in the 1980's" (04/19/2015)  . NICM (nonischemic cardiomyopathy) ([place])    no obs CAD on LHC in 1/16 - ? HTN or substance abuse  . Persistent atrial fibrillation (HCC)    a. s/p DCCV; b. Amiodarone  . Substance abuse  ([place])    ETOH; crack cocaine  . Tobacco abuse     Patient Active Problem List   Diagnosis Date Noted  . Syncope 01/05/2017  . Polysubstance abuse ([place])   . Alcohol abuse   . Sciatica of left side   . Syncope and collapse   . PAF (paroxysmal atrial fibrillation) ([place])   . Non-ischemic cardiomyopathy ([place])   . Health care maintenance 06/20/2016  . Urinary incontinence 05/03/2015  . LBP radiating to left leg 03/12/2015  . Nonischemic cardiomyopathy ([place]) 03/05/2015  . Hx of substance abuse 10/30/2014  . Faintness   . Homeless single person   . Atrial fibrillation with RVR ([place]) 10/13/2014  . High risk social situation 09/30/2014  . Colon cancer ([place]) 09/26/2014  . HFrEF (heart failure with reduced ejection fraction) ([place]) 09/12/2014  . A-fib ([place]) 09/12/2014  . COPD (chronic obstructive pulmonary disease) ([place]) 09/12/2014    Past Surgical History:  Procedure Laterality Date  . CARDIAC CATHETERIZATION  10/23/2014  . CARDIOVERSION     "I've had 2 in all" (04/19/2015)  . CARDIOVERSION N/A 01/30/2015   Procedure: CARDIOVERSION;  Surgeon: Larey Dresser, MD;  Location: [facility];  Service: Cardiovascular;  Laterality: N/A;  . COLONOSCOPY    . COLONOSCOPY WITH PROPOFOL N/A 11/30/2014   Procedure: COLONOSCOPY WITH PROPOFOL;  Surgeon: Milus Banister, MD;  Location: [facility];  Service:  Endoscopy;  Laterality: N/A;  . LEFT AND RIGHT HEART CATHETERIZATION WITH CORONARY ANGIOGRAM N/A 10/23/2014   Procedure: LEFT AND RIGHT HEART CATHETERIZATION WITH CORONARY ANGIOGRAM;  Surgeon: Larey Dresser, MD;  Location: [facility] C[facility] LAB;  Service: Cardiovascular;  Laterality: N/A;       Home Medications    Prior to Admission medications   Medication Sig Start Date End Date Taking? Authorizing Provider  amiodarone (PACERONE) 200 MG tablet Take 1 tablet (200 mg total) by mouth daily. 08/16/17 09/15/17  Rodell Perna A, PA-C  carvedilol (COREG) 3.125 MG tablet Take 1 tablet (3.125 mg total) by mouth  2 (two) times daily with a meal. 08/16/17 09/15/17  Jule Whitsel A, PA-C  HYDROcodone-acetaminophen (NORCO/VICODIN) 5-325 MG tablet Take 2 tablets by mouth every 6 (six) hours as needed for severe pain. 08/16/17   Nils Flack, Aimie Wagman A, PA-C  losartan (COZAAR) 25 MG tablet Take 1 tablet (25 mg total) by mouth at bedtime. 08/16/17 09/15/17  Rodell Perna A, PA-C  rivaroxaban (XARELTO) 20 MG TABS tablet Take 1 tablet (20 mg total) by mouth daily with supper. 08/16/17 09/15/17  Renita Papa, PA-C    Family History Family History  Problem Relation Age of Onset  . Hypertension Mother     Social History Social History  Substance Use Topics  . Smoking status: Current Every Day Smoker    Packs/day: 0.10    Years: 52.00    Types: Cigarettes    Last attempt to quit: 09/01/2014  . Smokeless tobacco: Never Used     Comment: 06/20/16-started back smoking; pack lasts 1 mo; wants to quit again  . Alcohol use 0.0 oz/week     Comment: 04/19/2015 "I might drink a 40oz beer/month"     Allergies   Patient has no known allergies.   Review of Systems Review of Systems  Constitutional: Negative for chills and fever.  Respiratory: Negative for shortness of breath.   Cardiovascular: Negative for chest pain.  Gastrointestinal: Negative for abdominal pain, nausea and vomiting.  Genitourinary:       No bowel/bladder incontinence  Musculoskeletal: Positive for back pain.  Neurological: Negative for syncope, weakness and numbness.     Physical Exam Updated Vital Signs BP (!) 152/112 (BP Location: Right Arm)   Pulse 91   Temp 98 F (36.7 C) (Oral)   Resp 18   Ht 6' (1.829 m)   Wt 89.8 kg (198 lb)   SpO2 100%   BMI 26.85 kg/m   Physical Exam  Constitutional: He is oriented to person, place, and time. He appears well-developed and well-nourished. No distress.  HENT:  Head: Normocephalic and atraumatic.  Eyes: Conjunctivae are normal. Right eye exhibits no discharge. Left eye exhibits no discharge.    Neck: No JVD present. No tracheal deviation present.  Cardiovascular: Normal rate, regular rhythm, normal heart sounds and intact distal pulses.   2+ radial and DP/PT pulses bl, negative Homan's bl   Pulmonary/Chest: Effort normal and breath sounds normal.  Abdominal: Soft. Bowel sounds are normal. He exhibits no distension. There is no tenderness.  Musculoskeletal: He exhibits no edema.  No midline spine TTP, no paraspinal muscle tenderness, no deformity, crepitus, or step-off noted. Positive left straight leg raise. Normal range of motion of the lumbar spine with pain elicited with extension of the lumbar spine. 5/5 strength of BLE major muscle groups. No swelling, deformity, or crepitus noted.  Neurological: He is alert and oriented to person, place, and time. No cranial nerve deficit or  sensory deficit. He exhibits normal muscle tone.  Fluent speech, no facial droop, sensation intact globally, normal gait, and patient able to heel walk and toe walk without difficulty.   Skin: Skin is warm and dry. No erythema.  Psychiatric: He has a normal mood and affect. His behavior is normal.  Nursing note and vitals reviewed.    ED Treatments / Results  Labs (all labs ordered are listed, but only abnormal results are displayed) Labs Reviewed - No data to display  EKG  EKG Interpretation None       Radiology No results found.  Procedures Procedures (including critical care time)  Medications Ordered in ED Medications  losartan (COZAAR) tablet 25 mg (not administered)  carvedilol (COREG) tablet 3.125 mg (not administered)  amiodarone (PACERONE) tablet 200 mg (not administered)  rivaroxaban (XARELTO) tablet 20 mg (not administered)     Initial Impression / Assessment and Plan / ED Course  I have reviewed the triage vital signs and the nursing notes.  Pertinent labs & imaging results that were available during my care of the patient were reviewed by me and considered in my  medical decision making (see chart for details).     Patient with sciatica and requesting medication refill after being discharged from jail earlier today. Afebrile, vital signs are stable and he has not had his blood pressure medications today. No focal neurological deficits. No red flags signs concerning for cauda equina or spinal cord compression. Reviewed medications with patient her last cardiology note. He will be given his is a relative and other medications while in the ED will discharge with a small prescription of Norco for his sciatica. Last prescription for Norco or other controlled substance was 04/07/2017 for 10 tablets of Norco 5-325 mg. He is ambulatory without difficulty. He has follow-up scheduled with his primary care physician 09/02/2017. Discussed indications for return to the ED. Will discharge with prescriptions for his home medications. Pt verbalized understanding of and agreement with plan and is safe for discharge home at this time.   Final Clinical Impressions(s) / ED Diagnoses   Final diagnoses:  Medication refill  Sciatica of left side    New Prescriptions New Prescriptions   HYDROCODONE-ACETAMINOPHEN (NORCO/VICODIN) 5-325 MG TABLET    Take 2 tablets by mouth every 6 (six) hours as needed for severe pain.   RIVAROXABAN (XARELTO) 20 MG TABS TABLET    Take 1 tablet (20 mg total) by mouth daily with supper.     Renita Papa, PA-C 08/16/17 2106

## 2017-09-02 ENCOUNTER — Encounter (HOSPITAL_COMMUNITY): Payer: Medicare Other

## 2017-09-06 ENCOUNTER — Emergency Department (HOSPITAL_COMMUNITY)
Admission: EM | Admit: 2017-09-06 | Discharge: 2017-09-06 | Disposition: A | Discharge status: Home / Self Care | Payer: Medicare Other | Attending: Emergency Medicine | Admitting: Emergency Medicine

## 2017-09-06 ENCOUNTER — Encounter (HOSPITAL_COMMUNITY): Payer: Self-pay

## 2017-09-06 DIAGNOSIS — Z79899 Other long term (current) drug therapy: Secondary | ICD-10-CM | POA: Diagnosis not present

## 2017-09-06 DIAGNOSIS — I110 Hypertensive heart disease with heart failure: Secondary | ICD-10-CM | POA: Insufficient documentation

## 2017-09-06 DIAGNOSIS — J449 Chronic obstructive pulmonary disease, unspecified: Secondary | ICD-10-CM | POA: Insufficient documentation

## 2017-09-06 DIAGNOSIS — M5432 Sciatica, left side: Secondary | ICD-10-CM | POA: Diagnosis not present

## 2017-09-06 DIAGNOSIS — I2510 Atherosclerotic heart disease of native coronary artery without angina pectoris: Secondary | ICD-10-CM | POA: Insufficient documentation

## 2017-09-06 DIAGNOSIS — I5022 Chronic systolic (congestive) heart failure: Secondary | ICD-10-CM | POA: Diagnosis not present

## 2017-09-06 DIAGNOSIS — F17210 Nicotine dependence, cigarettes, uncomplicated: Secondary | ICD-10-CM | POA: Diagnosis not present

## 2017-09-06 DIAGNOSIS — I481 Persistent atrial fibrillation: Secondary | ICD-10-CM | POA: Diagnosis not present

## 2017-09-06 DIAGNOSIS — M545 Low back pain: Secondary | ICD-10-CM | POA: Diagnosis present

## 2017-09-06 DIAGNOSIS — C189 Malignant neoplasm of colon, unspecified: Secondary | ICD-10-CM | POA: Insufficient documentation

## 2017-09-06 MED ORDER — HYDROCODONE-ACETAMINOPHEN 5-325 MG PO TABS: 2.0000 | ORAL_TABLET | Freq: Every evening | ORAL | 0 refills | Status: DC | PRN

## 2017-09-06 NOTE — ED Triage Notes (Signed)
Per Pt, Pt is coming from home with complaints of left hip pain secondary to his sciatica. Reports missing his appt yesterday due to no money to catch the bus.

## 2017-09-06 NOTE — ED Provider Notes (Signed)
[facility] EMERGENCY DEPARTMENT Provider Note   CSN: [id number] Arrival date & time: 09/06/17  1457     History   Chief Complaint Chief Complaint  Patient presents with  . Hip Pain    Paul Clayton is a 71 y.o. male.  The history is provided by the patient. No language interpreter was used.  Hip Pain     Paul Clayton is a 71 y.o. male who presents to the Emergency Department complaining of low back pain pain.  He reports a couple days of left-sided low back pain that radiates down the back of his left leg.  No recent injuries.  Pain is worse with ambulation.  He had similar symptoms a few weeks ago that temporarily improved only to worsen.  He was supposed to see his PCP 4 days ago for medication refill but was unable to make the appointment due to weather and transportation issues.  He denies any fevers, nausea, vomiting, numbness, weakness, abdominal pain.  He has been having problems with urinary incontinence for the last several months, unchanged.  No reports of fevers but he has been experiencing night sweats for the last several months.  He was recently incarcerated and was released about a month ago.  He has a history of atrial fibrillation and hypertension and takes Xarelto.  He has been compliant with his medications.  He does smoke and drinks occasional alcohol.  He has a history of crack cocaine use but none recently.  No history of IV drug use.  Past Medical History:  Diagnosis Date  . Adenocarcinoma of colon ([place])    STAGE 1  . Anxiety   . Arthritis    "left hip" (04/19/2015)  . CAD (coronary artery disease)    a. LHC 1/16:  LM 20%, oLAD 30%, oLCx 30%, pRCA 30%  . Chronic lower back pain   . Chronic systolic CHF (congestive heart failure) (HCC)    a. EF previously 15%; b. Echo 7/16:  EF 30-35%, diff HK, gr 1 DD, mild MR, mild LAE, mild reduced RVSF  . COPD (chronic obstructive pulmonary disease) ([place])   . Depression   . GERD  (gastroesophageal reflux disease)   . Hepatitis C   . Hypertension   . Migraine    "last one was back in the 1980's" (04/19/2015)  . NICM (nonischemic cardiomyopathy) ([place])    no obs CAD on LHC in 1/16 - ? HTN or substance abuse  . Persistent atrial fibrillation (HCC)    a. s/p DCCV; b. Amiodarone  . Substance abuse ([place])    ETOH; crack cocaine  . Tobacco abuse     Patient Active Problem List   Diagnosis Date Noted  . Syncope 01/05/2017  . Polysubstance abuse ([place])   . Alcohol abuse   . Sciatica of left side   . Syncope and collapse   . PAF (paroxysmal atrial fibrillation) ([place])   . Non-ischemic cardiomyopathy ([place])   . Health care maintenance 06/20/2016  . Urinary incontinence 05/03/2015  . LBP radiating to left leg 03/12/2015  . Nonischemic cardiomyopathy ([place]) 03/05/2015  . Hx of substance abuse 10/30/2014  . Faintness   . Homeless single person   . Atrial fibrillation with RVR ([place]) 10/13/2014  . High risk social situation 09/30/2014  . Colon cancer ([place]) 09/26/2014  . HFrEF (heart failure with reduced ejection fraction) ([place]) 09/12/2014  . A-fib ([place]) 09/12/2014  . COPD (chronic obstructive pulmonary disease) ([place]) 09/12/2014    Past  Surgical History:  Procedure Laterality Date  . CARDIAC CATHETERIZATION  10/23/2014  . CARDIOVERSION     "I've had 2 in all" (04/19/2015)  . CARDIOVERSION N/A 01/30/2015   Performed by Larey Dresser, MD at [facility]  . COLONOSCOPY    . COLONOSCOPY WITH PROPOFOL N/A 11/30/2014   Performed by Milus Banister, MD at [facility]  . LEFT AND RIGHT HEART CATHETERIZATION WITH CORONARY ANGIOGRAM N/A 10/23/2014   Performed by Larey Dresser, MD at [facility] CATH LAB       Home Medications    Prior to Admission medications   Medication Sig Start Date End Date Taking? Authorizing Provider  amiodarone (PACERONE) 200 MG tablet Take 1 tablet (200 mg total) by mouth daily. 08/16/17 09/15/17  Rodell Perna A, PA-C  carvedilol (COREG) 3.125 MG  tablet Take 1 tablet (3.125 mg total) by mouth 2 (two) times daily with a meal. 08/16/17 09/15/17  Fawze, Mina A, PA-C  HYDROcodone-acetaminophen (NORCO/VICODIN) 5-325 MG tablet Take 2 tablets at bedtime as needed by mouth for severe pain. 09/06/17   Quintella Reichert, MD  losartan (COZAAR) 25 MG tablet Take 1 tablet (25 mg total) by mouth at bedtime. 08/16/17 09/15/17  Rodell Perna A, PA-C  rivaroxaban (XARELTO) 20 MG TABS tablet Take 1 tablet (20 mg total) by mouth daily with supper. 08/16/17 09/15/17  Renita Papa, PA-C    Family History Family History  Problem Relation Age of Onset  . Hypertension Mother     Social History Social History   Tobacco Use  . Smoking status: Current Every Day Smoker    Packs/day: 0.10    Years: 52.00    Pack years: 5.20    Types: Cigarettes    Last attempt to quit: 09/01/2014    Years since quitting: 3.0  . Smokeless tobacco: Never Used  . Tobacco comment: 06/20/16-started back smoking; pack lasts 1 mo; wants to quit again  Substance Use Topics  . Alcohol use: Yes    Alcohol/week: 0.0 oz    Comment: 04/19/2015 "I might drink a 40oz beer/month"  . Drug use: Yes    Types: "Crack" cocaine, Marijuana    Comment: 04/19/2015 "I was smoking weed; someone laced it w/crack 04/18/2015; I smoke weed a couple times/yr"     Allergies   Patient has no known allergies.   Review of Systems Review of Systems  All other systems reviewed and are negative.    Physical Exam Updated Vital Signs BP 139/79 (BP Location: Right Arm)   Pulse 98   Temp 99 F (37.2 C) (Oral)   Resp 16   Ht 6' (1.829 m)   Wt 86.2 kg (190 lb)   SpO2 100%   BMI 25.77 kg/m   Physical Exam  Constitutional: He is oriented to person, place, and time. He appears well-developed and well-nourished.  HENT:  Head: Normocephalic and atraumatic.  Cardiovascular: Normal rate and regular rhythm.  No murmur heard. Pulmonary/Chest: Effort normal and breath sounds normal. No respiratory  distress.  Abdominal: Soft. There is no tenderness. There is no rebound and no guarding.  Musculoskeletal: He exhibits no edema.  There is mild tenderness to palpation over the left SI joint.  2+ DP pulses bilaterally.  Slightly antalgic gait.  There is no midline lumbar tenderness to palpation.  Neurological: He is alert and oriented to person, place, and time.  5 out of 5 strength in all 4 extremities with sensation to light touch intact in all 4 extremities.  Skin: Skin is warm and dry.  Psychiatric: He has a normal mood and affect. His behavior is normal.  Nursing note and vitals reviewed.    ED Treatments / Results  Labs (all labs ordered are listed, but only abnormal results are displayed) Labs Reviewed - No data to display  EKG  EKG Interpretation None       Radiology No results found.  Procedures Procedures (including critical care time)  Medications Ordered in ED Medications - No data to display   Initial Impression / Assessment and Plan / ED Course  I have reviewed the triage vital signs and the nursing notes.  Pertinent labs & imaging results that were available during my care of the patient were reviewed by me and considered in my medical decision making (see chart for details).     Patient here for evaluation of low back pain with radicular symptoms.  He is neurologically intact on examination with no focal deficits.  Presentation is not consistent with cauda equina, epidural abscess, dissection, infection.  He recently had a CT abdomen in May that was negative for aneurysm.  He did have obstructing stone at that time but he has passed the stone.  The patient is requesting refill on his medications but on review of his home medications he has about 2-week supply remaining.  Discussed with patient importance of following up with family medicine as initially intended.  Discussed with patient home care for sciatica, given Xarelto use cannot use NSAIDs.  He does have  a history of substance abuse and will only provide a small prescription for narcotic pain medication for use at night.  Otherwise recommend activity as tolerated.  Final Clinical Impressions(s) / ED Diagnoses   Final diagnoses:  Sciatica of left side    ED Discharge Orders        Ordered    HYDROcodone-acetaminophen (NORCO/VICODIN) 5-325 MG tablet  At bedtime PRN     09/06/17 1712       Quintella Reichert, MD 09/06/17 1715

## 2017-09-16 ENCOUNTER — Encounter (HOSPITAL_COMMUNITY): Payer: Self-pay | Admitting: *Deleted

## 2017-09-16 ENCOUNTER — Emergency Department (HOSPITAL_COMMUNITY): Payer: Medicare Other

## 2017-09-16 ENCOUNTER — Emergency Department (HOSPITAL_COMMUNITY)
Admission: EM | Admit: 2017-09-16 | Discharge: 2017-09-16 | Disposition: A | Discharge status: Home / Self Care | Payer: Medicare Other | Attending: Emergency Medicine | Admitting: Emergency Medicine

## 2017-09-16 ENCOUNTER — Other Ambulatory Visit: Payer: Self-pay

## 2017-09-16 ENCOUNTER — Ambulatory Visit: Payer: Medicare Other | Admitting: Internal Medicine

## 2017-09-16 DIAGNOSIS — I2510 Atherosclerotic heart disease of native coronary artery without angina pectoris: Secondary | ICD-10-CM | POA: Diagnosis not present

## 2017-09-16 DIAGNOSIS — Z79899 Other long term (current) drug therapy: Secondary | ICD-10-CM | POA: Insufficient documentation

## 2017-09-16 DIAGNOSIS — J449 Chronic obstructive pulmonary disease, unspecified: Secondary | ICD-10-CM | POA: Insufficient documentation

## 2017-09-16 DIAGNOSIS — Z7901 Long term (current) use of anticoagulants: Secondary | ICD-10-CM | POA: Insufficient documentation

## 2017-09-16 DIAGNOSIS — G8929 Other chronic pain: Secondary | ICD-10-CM | POA: Diagnosis not present

## 2017-09-16 DIAGNOSIS — M25552 Pain in left hip: Secondary | ICD-10-CM | POA: Insufficient documentation

## 2017-09-16 DIAGNOSIS — M5432 Sciatica, left side: Secondary | ICD-10-CM

## 2017-09-16 DIAGNOSIS — I110 Hypertensive heart disease with heart failure: Secondary | ICD-10-CM | POA: Insufficient documentation

## 2017-09-16 DIAGNOSIS — M545 Low back pain: Secondary | ICD-10-CM | POA: Diagnosis not present

## 2017-09-16 DIAGNOSIS — R1032 Left lower quadrant pain: Secondary | ICD-10-CM | POA: Diagnosis not present

## 2017-09-16 DIAGNOSIS — Z85038 Personal history of other malignant neoplasm of large intestine: Secondary | ICD-10-CM | POA: Insufficient documentation

## 2017-09-16 DIAGNOSIS — I5022 Chronic systolic (congestive) heart failure: Secondary | ICD-10-CM | POA: Insufficient documentation

## 2017-09-16 DIAGNOSIS — F17210 Nicotine dependence, cigarettes, uncomplicated: Secondary | ICD-10-CM | POA: Insufficient documentation

## 2017-09-16 MED ORDER — HYDROCODONE-ACETAMINOPHEN 5-325 MG PO TABS
1.0000 | ORAL_TABLET | Freq: Once | ORAL | Status: AC
  Administered 2017-09-16: 1 via ORAL
  Filled 2017-09-16: qty 1

## 2017-09-16 MED ORDER — HYDROCODONE-ACETAMINOPHEN 5-325 MG PO TABS: 1.0000 | ORAL_TABLET | ORAL | 0 refills | Status: DC | PRN

## 2017-09-16 NOTE — ED Notes (Signed)
Patient transported to X-ray 

## 2017-09-16 NOTE — ED Triage Notes (Signed)
PT states has been tx for hip pain previously.  States unable to see pcp d/t finances, but should get paid Fri.  States L hip "gave out" again, but did not fall.

## 2017-09-16 NOTE — ED Provider Notes (Signed)
[facility] EMERGENCY DEPARTMENT Provider Note   CSN: [id number] Arrival date & time: 09/16/17  0845     History   Chief Complaint Chief Complaint  Patient presents with  . Hip Pain    HPI Paul Clayton is a 71 y.o. male.  The history is provided by the patient and medical records. No language interpreter was used.  Hip Pain  This is a recurrent problem. The current episode started yesterday. The problem occurs constantly. The problem has not changed since onset.Pertinent negatives include no chest pain, no abdominal pain, no headaches and no shortness of breath. The symptoms are aggravated by walking. Nothing relieves the symptoms. He has tried nothing for the symptoms. The treatment provided no relief.    Past Medical History:  Diagnosis Date  . Adenocarcinoma of colon (Paul Clayton)    STAGE 1  . Anxiety   . Arthritis    "left hip" (04/19/2015)  . CAD (coronary artery disease)    a. LHC 1/16:  LM 20%, oLAD 30%, oLCx 30%, pRCA 30%  . Chronic lower back pain   . Chronic systolic CHF (congestive heart failure) (HCC)    a. EF previously 15%; b. Echo 7/16:  EF 30-35%, diff HK, gr 1 DD, mild MR, mild LAE, mild reduced RVSF  . COPD (chronic obstructive pulmonary disease) ([place])   . Depression   . GERD (gastroesophageal reflux disease)   . Hepatitis C   . Hypertension   . Migraine    "last one was back in the 1980's" (04/19/2015)  . NICM (nonischemic cardiomyopathy) ([place])    no obs CAD on LHC in 1/16 - ? HTN or substance abuse  . Persistent atrial fibrillation (HCC)    a. s/p DCCV; b. Amiodarone  . Substance abuse ([place])    ETOH; crack cocaine  . Tobacco abuse     Patient Active Problem List   Diagnosis Date Noted  . Syncope 01/05/2017  . Polysubstance abuse ([place])   . Alcohol abuse   . Sciatica of left side   . Syncope and collapse   . PAF (paroxysmal atrial fibrillation) ([place])   . Non-ischemic cardiomyopathy ([place])   . Health care maintenance 06/20/2016    . Urinary incontinence 05/03/2015  . LBP radiating to left leg 03/12/2015  . Nonischemic cardiomyopathy ([place]) 03/05/2015  . Hx of substance abuse 10/30/2014  . Faintness   . Homeless single person   . Atrial fibrillation with RVR ([place]) 10/13/2014  . High risk social situation 09/30/2014  . Colon cancer ([place]) 09/26/2014  . HFrEF (heart failure with reduced ejection fraction) ([place]) 09/12/2014  . A-fib ([place]) 09/12/2014  . COPD (chronic obstructive pulmonary disease) ([place]) 09/12/2014    Past Surgical History:  Procedure Laterality Date  . CARDIAC CATHETERIZATION  10/23/2014  . CARDIOVERSION     "I've had 2 in all" (04/19/2015)  . CARDIOVERSION N/A 01/30/2015   Procedure: CARDIOVERSION;  Surgeon: Larey Dresser, MD;  Location: [facility];  Service: Cardiovascular;  Laterality: N/A;  . COLONOSCOPY    . COLONOSCOPY WITH PROPOFOL N/A 11/30/2014   Procedure: COLONOSCOPY WITH PROPOFOL;  Surgeon: Milus Banister, MD;  Location: [facility] ENDOSCOPY;  Service: Endoscopy;  Laterality: N/A;  . LEFT AND RIGHT HEART CATHETERIZATION WITH CORONARY ANGIOGRAM N/A 10/23/2014   Procedure: LEFT AND RIGHT HEART CATHETERIZATION WITH CORONARY ANGIOGRAM;  Surgeon: Larey Dresser, MD;  Location: [facility] CATH LAB;  Service: Cardiovascular;  Laterality: N/A;       Home Medications  Prior to Admission medications   Medication Sig Start Date End Date Taking? Authorizing Provider  amiodarone (PACERONE) 200 MG tablet Take 1 tablet (200 mg total) by mouth daily. 08/16/17 09/15/17  Rodell Perna A, PA-C  carvedilol (COREG) 3.125 MG tablet Take 1 tablet (3.125 mg total) by mouth 2 (two) times daily with a meal. 08/16/17 09/15/17  Fawze, Mina A, PA-C  HYDROcodone-acetaminophen (NORCO/VICODIN) 5-325 MG tablet Take 2 tablets at bedtime as needed by mouth for severe pain. 09/06/17   Quintella Reichert, MD  losartan (COZAAR) 25 MG tablet Take 1 tablet (25 mg total) by mouth at bedtime. 08/16/17 09/15/17  Rodell Perna A, PA-C   rivaroxaban (XARELTO) 20 MG TABS tablet Take 1 tablet (20 mg total) by mouth daily with supper. 08/16/17 09/15/17  Renita Papa, PA-C    Family History Family History  Problem Relation Age of Onset  . Hypertension Mother     Social History Social History   Tobacco Use  . Smoking status: Current Every Day Smoker    Packs/day: 0.10    Years: 52.00    Pack years: 5.20    Types: Cigarettes    Last attempt to quit: 09/01/2014    Years since quitting: 3.0  . Smokeless tobacco: Never Used  . Tobacco comment: 06/20/16-started back smoking; pack lasts 1 mo; wants to quit again  Substance Use Topics  . Alcohol use: Yes    Alcohol/week: 0.0 oz    Comment: 1 can beer every other day  . Drug use: Yes    Types: "Crack" cocaine, Marijuana    Comment: 04/18/2017 "I was smoking weed; someone laced it w/crack 04/18/2015; I smoke weed a couple times/yr"     Allergies   Patient has no known allergies.   Review of Systems Review of Systems  Constitutional: Negative for chills, diaphoresis, fatigue and fever.  HENT: Negative for congestion.   Respiratory: Negative for cough, chest tightness and shortness of breath.   Cardiovascular: Negative for chest pain.  Gastrointestinal: Negative for abdominal pain.  Genitourinary: Negative for decreased urine volume, dysuria and flank pain.  Musculoskeletal: Negative for back pain, neck pain and neck stiffness.  Neurological: Negative for light-headedness, numbness and headaches.  Psychiatric/Behavioral: Negative for agitation.  All other systems reviewed and are negative.    Physical Exam Updated Vital Signs BP (!) 152/99   Pulse 84   Temp 98.4 F (36.9 C) (Oral)   Resp 14   Ht 6' (1.829 m)   Wt 83.9 kg (185 lb)   SpO2 98%   BMI 25.09 kg/m   Physical Exam  Constitutional: He is oriented to person, place, and time. He appears well-developed and well-nourished. No distress.  HENT:  Head: Normocephalic and atraumatic.  Mouth/Throat:  Oropharynx is clear and moist.  Eyes: Conjunctivae and EOM are normal. Pupils are equal, round, and reactive to light.  Neck: Normal range of motion.  Cardiovascular: Normal rate and intact distal pulses.  No murmur heard. Pulmonary/Chest: Effort normal. No stridor. No respiratory distress. He has no wheezes. He exhibits no tenderness.  Abdominal: Soft. There is no tenderness.  Musculoskeletal: He exhibits tenderness. He exhibits no edema or deformity.       Left hip: He exhibits tenderness.       Legs: Neurological: He is alert and oriented to person, place, and time. No cranial nerve deficit or sensory deficit. He exhibits normal muscle tone.  Full strength in bilateral lower extremities, normal sensation, normal pulses, normal capillary refill.  Straight leg raise test positive on left side.  Skin: Capillary refill takes less than 2 seconds. He is not diaphoretic. No erythema. No pallor.  Psychiatric: He has a normal mood and affect.  Nursing note and vitals reviewed.    ED Treatments / Results  Labs (all labs ordered are listed, but only abnormal results are displayed) Labs Reviewed - No data to display  EKG  EKG Interpretation None       Radiology Dg Lumbar Spine Complete  Result Date: 09/16/2017 CLINICAL DATA:  Low back pain radiating into the left leg for the past 2 years, acutely increased yesterday when the left hip gave out. EXAM: LUMBAR SPINE - COMPLETE 4+ VIEW COMPARISON:  CT abdomen pelvis dated Mar 04, 2017. FINDINGS: Five lumbar type vertebral bodies. No acute fracture or subluxation. Vertebral body heights are preserved. Scattered anterior endplate spurring. Mild disc height loss at L1-L2. Remaining intervertebral disc heights are preserved. Moderate to severe lower lumbar facet arthropathy. IMPRESSION: 1. No acute osseous abnormality. Mild degenerative disc disease and advanced lower lumbar facet arthropathy, similar to prior study. Electronically Signed   By:  Titus Dubin M.D.   On: 09/16/2017 11:29   Dg Hip Unilat W Or Wo Pelvis 2-3 Views Left  Result Date: 09/16/2017 CLINICAL DATA:  Left hip pain for the past 2 years, acutely increased yesterday when the left hip gave out. EXAM: DG HIP (WITH OR WITHOUT PELVIS) 2-3V LEFT COMPARISON:  Left hip x-rays dated December 03, 2015. FINDINGS: There is no evidence of hip fracture or dislocation. Stable moderate left and mild-to-moderate right hip joint degenerative changes. IMPRESSION: No acute osseous abnormality. Stable bilateral hip degenerative changes, left worse than right. Electronically Signed   By: Titus Dubin M.D.   On: 09/16/2017 11:27    Procedures Procedures (including critical care time)  Medications Ordered in ED Medications  HYDROcodone-acetaminophen (NORCO/VICODIN) 5-325 MG per tablet 1 tablet (not administered)     Initial Impression / Assessment and Plan / ED Course  I have reviewed the triage vital signs and the nursing notes.  Pertinent labs & imaging results that were available during my care of the patient were reviewed by me and considered in my medical decision making (see chart for details).     Paul Clayton is a 71 y.o. male with a past medical history significant for colon cancer, COPD, atrial fibrillation on Xarelto, heart failure, chronic low back pain with left-sided sciatica, and substance abuse who presents with worsened left hip and sciatic pain.  Patient reports that he has had pain in his low back and left leg for years.  He reports that his pain returned yesterday and has been severe.  He denies any new trauma but reports he has not been evaluated by a physician for several months.  He describes his pain as severe and in the left low back and radiating down his left leg.  He denies leg swelling, numbness, or tingling.  He reports chronic intermittent urinary incontinence.  He denies severe midline back pain at this time.  He has not taken any medicine for his  symptoms.  He has not yet seen his PCP about this.  On exam, patient had left hip tenderness as well as positive straight leg raise on the left.  Patient did have some left low back tenderness.  Normal pulses bilaterally and lack of normal sensation, and normal strength of legs.  Lungs clear and abdomen nontender.  Based on exam and history, do  not feel patient has DVT type pain.  I suspect this is sciatic pain with the straight leg raise and similar to prior sciatic pain.  I am however concerned that he has not had imaging in several months with this newly worsened pain.  With his history of colon cancer, x-rays will be obtained to look for pathologic fracture or other severe change.  Patient given oral pain medicine while awaiting imaging.  Imaging return showed no significant changes or abnormalities other than degenerative disease.  Patient reassured about imaging.  Patient's pain had improved and he was able to ambulate.  Patient given a prescription for several pills of pain medication and instructions that he needs to see his PCP for further pain management.  Patient agreed and understood return precautions.  At this time, do not feel patient has infection, bleeding, or cauda equina in his back.  Suspect this is his chronic pains.  Patient agreed with plan of care and understood return precautions.  Patient discharged in good condition with improving symptoms.     Final Clinical Impressions(s) / ED Diagnoses   Final diagnoses:  Left hip pain  Sciatica of left side    ED Discharge Orders        Ordered    HYDROcodone-acetaminophen (NORCO/VICODIN) 5-325 MG tablet  Every 4 hours PRN     09/16/17 1239      Clinical Impression: 1. Left hip pain   2. Sciatica of left side     Disposition: Discharge  Condition: Good  I have discussed the results, Dx and Tx plan with the pt(& family if present). He/she/they expressed understanding and agree(s) with the plan. Discharge instructions  discussed at great length. Strict return precautions discussed and pt &/or family have verbalized understanding of the instructions. No further questions at time of discharge.    This SmartLink is deprecated. Use AVSMEDLIST instead to display the medication list for a patient.  Follow Up: [place] [id number] [phone] Schedule an appointment as soon as possible for a visit    [facility] [address] [id number] [place] [phone]  If symptoms worsen     [place] Prevo, Gwenyth Allegra, MD 09/16/17 2158

## 2017-09-16 NOTE — ED Notes (Signed)
ED Provider at bedside. 

## 2017-09-16 NOTE — Discharge Instructions (Signed)
Your x-rays today showed no evidence of new injuries or abnormalities.  You have degenerative disease in your hips.  We suspect your pain is due to your sciatic nerve pain.  Please use the pain medicine and follow-up with your primary doctor for further management.  If any symptoms change or worsen, please return to the nearest emergency department.

## 2017-09-20 ENCOUNTER — Other Ambulatory Visit: Payer: Self-pay

## 2017-09-20 ENCOUNTER — Encounter (HOSPITAL_COMMUNITY): Payer: Self-pay

## 2017-09-20 ENCOUNTER — Emergency Department (HOSPITAL_COMMUNITY)
Admission: EM | Admit: 2017-09-20 | Discharge: 2017-09-20 | Disposition: A | Discharge status: Home / Self Care | Payer: Medicare Other | Attending: Emergency Medicine | Admitting: Emergency Medicine

## 2017-09-20 DIAGNOSIS — J449 Chronic obstructive pulmonary disease, unspecified: Secondary | ICD-10-CM | POA: Insufficient documentation

## 2017-09-20 DIAGNOSIS — I5022 Chronic systolic (congestive) heart failure: Secondary | ICD-10-CM | POA: Insufficient documentation

## 2017-09-20 DIAGNOSIS — I2510 Atherosclerotic heart disease of native coronary artery without angina pectoris: Secondary | ICD-10-CM | POA: Diagnosis not present

## 2017-09-20 DIAGNOSIS — M5442 Lumbago with sciatica, left side: Secondary | ICD-10-CM | POA: Insufficient documentation

## 2017-09-20 DIAGNOSIS — I110 Hypertensive heart disease with heart failure: Secondary | ICD-10-CM | POA: Insufficient documentation

## 2017-09-20 DIAGNOSIS — F17210 Nicotine dependence, cigarettes, uncomplicated: Secondary | ICD-10-CM | POA: Diagnosis not present

## 2017-09-20 DIAGNOSIS — M545 Low back pain: Secondary | ICD-10-CM | POA: Diagnosis present

## 2017-09-20 MED ORDER — METHOCARBAMOL 500 MG PO TABS: 500.0000 mg | ORAL_TABLET | Freq: Two times a day (BID) | ORAL | 0 refills | Status: DC

## 2017-09-20 MED ORDER — LIDOCAINE 5 % EX PTCH: 1.0000 | MEDICATED_PATCH | CUTANEOUS | 0 refills | Status: DC

## 2017-09-20 MED ORDER — METHOCARBAMOL 500 MG PO TABS
500.0000 mg | ORAL_TABLET | Freq: Once | ORAL | Status: AC
  Administered 2017-09-20: 500 mg via ORAL
  Filled 2017-09-20: qty 1

## 2017-09-20 MED ORDER — PREDNISONE 20 MG PO TABS: 20.0000 mg | ORAL_TABLET | Freq: Every day | ORAL | 0 refills | Status: AC

## 2017-09-20 NOTE — ED Triage Notes (Signed)
Patient complains of ongoing sciatica pain and seen this past week for same, states that the pain continues to run down left leg

## 2017-09-20 NOTE — Discharge Instructions (Signed)
You have been seen in the Emergency Department (ED)  today for back pain.  Your workup and exam have not shown any acute abnormalities and you are likely suffering from muscle strain or possible problems with your discs, but there is no treatment that will fix your symptoms at this time.  Please take Tylenol as needed for your pain according to the instructions written on the box.     Please follow up with your doctor as soon as possible regarding today's ED visit and your back pain.  Return to the ED for worsening back pain, fever, weakness or numbness of either leg, or if you develop either (1) an inability to urinate or have bowel movements, or (2) loss of your ability to control your bathroom functions (if you start having "accidents"), or if you develop other new symptoms that concern you.  

## 2017-09-20 NOTE — ED Provider Notes (Signed)
Emergency Department Provider Note   I have reviewed the triage vital signs and the nursing notes.   HISTORY  HPI Paul Clayton is a 71 y.o. male with PMH of CAD, CHF, COPD, GERD, Polysubstance abuse, and chronic lower back pain since to the emergency department for evaluation of continued lower back pain.  Symptoms are similar to his chronic lower back pain but worse over the past several days.  He describes pain in the left lower back radiating down the entire left leg.  He denies any numbness or weakness in the leg.  He states sometimes it gives out because of pain.  Denies any bowel or bladder incontinence.  No groin numbness.  He was seen in the emergency department 4 days prior and discharged with hydrocodone for pain and states that he ran out.    Past Medical History:  Diagnosis Date  . Adenocarcinoma of colon ([place])    STAGE 1  . Anxiety   . Arthritis    "left hip" (04/19/2015)  . CAD (coronary artery disease)    a. LHC 1/16:  LM 20%, oLAD 30%, oLCx 30%, pRCA 30%  . Chronic lower back pain   . Chronic systolic CHF (congestive heart failure) (HCC)    a. EF previously 15%; b. Echo 7/16:  EF 30-35%, diff HK, gr 1 DD, mild MR, mild LAE, mild reduced RVSF  . COPD (chronic obstructive pulmonary disease) ([place])   . Depression   . GERD (gastroesophageal reflux disease)   . Hepatitis C   . Hypertension   . Migraine    "last one was back in the 1980's" (04/19/2015)  . NICM (nonischemic cardiomyopathy) ([place])    no obs CAD on LHC in 1/16 - ? HTN or substance abuse  . Persistent atrial fibrillation (HCC)    a. s/p DCCV; b. Amiodarone  . Substance abuse ([place])    ETOH; crack cocaine  . Tobacco abuse     Patient Active Problem List   Diagnosis Date Noted  . Syncope 01/05/2017  . Polysubstance abuse ([place])   . Alcohol abuse   . Sciatica of left side   . Syncope and collapse   . PAF (paroxysmal atrial fibrillation) ([place])   . Non-ischemic cardiomyopathy ([place])   . Health care  maintenance 06/20/2016  . Urinary incontinence 05/03/2015  . LBP radiating to left leg 03/12/2015  . Nonischemic cardiomyopathy ([place]) 03/05/2015  . Hx of substance abuse 10/30/2014  . Faintness   . Homeless single person   . Atrial fibrillation with RVR ([place]) 10/13/2014  . High risk social situation 09/30/2014  . Colon cancer ([place]) 09/26/2014  . HFrEF (heart failure with reduced ejection fraction) ([place]) 09/12/2014  . A-fib ([place]) 09/12/2014  . COPD (chronic obstructive pulmonary disease) ([place]) 09/12/2014    Past Surgical History:  Procedure Laterality Date  . CARDIAC CATHETERIZATION  10/23/2014  . CARDIOVERSION     "I've had 2 in all" (04/19/2015)  . CARDIOVERSION N/A 01/30/2015   Procedure: CARDIOVERSION;  Surgeon: Larey Dresser, MD;  Location: [facility];  Service: Cardiovascular;  Laterality: N/A;  . COLONOSCOPY    . COLONOSCOPY WITH PROPOFOL N/A 11/30/2014   Procedure: COLONOSCOPY WITH PROPOFOL;  Surgeon: Milus Banister, MD;  Location: [facility];  Service: Endoscopy;  Laterality: N/A;  . LEFT AND RIGHT HEART CATHETERIZATION WITH CORONARY ANGIOGRAM N/A 10/23/2014   Procedure: LEFT AND RIGHT HEART CATHETERIZATION WITH CORONARY ANGIOGRAM;  Surgeon: Larey Dresser, MD;  Location: [facility] CATH LAB;  Service:  Cardiovascular;  Laterality: N/A;    Current Outpatient Rx  . Order #: [id number] Class: Print  . Order #: [id number] Class: Print  . Order #: [id number] Class: Print  . Order #: [id number] Class: Print  . Order #: [id number] Class: Print  . Order #: [id number] Class: Print  . Order #: [id number] Class: Print  . Order #: [id number] Class: Print  . Order #: [id number] Class: Print    Allergies Patient has no known allergies.  Family History  Problem Relation Age of Onset  . Hypertension Mother     Social History Social History   Tobacco Use  . Smoking status: Current Every Day Smoker    Packs/day: 0.10    Years: 52.00    Pack years: 5.20    Types: Cigarettes    Last attempt to  quit: 09/01/2014    Years since quitting: 3.0  . Smokeless tobacco: Never Used  . Tobacco comment: 06/20/16-started back smoking; pack lasts 1 mo; wants to quit again  Substance Use Topics  . Alcohol use: Yes    Alcohol/week: 0.0 oz    Comment: 1 can beer every other day  . Drug use: Yes    Types: "Crack" cocaine, Marijuana    Comment: 04/18/2017 "I was smoking weed; someone laced it w/crack 04/18/2015; I smoke weed a couple times/yr"    Review of Systems  Constitutional: No fever/chills Eyes: No visual changes. ENT: No sore throat. Cardiovascular: Denies chest pain. Respiratory: Denies shortness of breath. Gastrointestinal: No abdominal pain.  No nausea, no vomiting.  No diarrhea.  No constipation. Genitourinary: Negative for dysuria. Musculoskeletal: Positive for back pain and left leg pain. Skin: Negative for rash. Neurological: Negative for headaches, focal weakness or numbness.  10-point ROS otherwise negative.  ____________________________________________   PHYSICAL EXAM:  VITAL SIGNS: ED Triage Vitals  Enc Vitals Group     BP 09/20/17 1617 (!) 147/78     Pulse Rate 09/20/17 1617 84     Resp 09/20/17 1617 16     Temp 09/20/17 1617 98 F (36.7 C)     Temp src --      SpO2 09/20/17 1617 99 %     Pain Score 09/20/17 1616 10   Constitutional: Alert and oriented. Well appearing and in no acute distress. Eyes: Conjunctivae are normal.  Head: Atraumatic. Nose: No congestion/rhinnorhea. Mouth/Throat: Mucous membranes are moist. Neck: No stridor.  Cardiovascular: Normal rate, regular rhythm. Good peripheral circulation. Grossly normal heart sounds.   Respiratory: Normal respiratory effort.  No retractions. Lungs CTAB. Gastrointestinal: Soft and nontender. No distention.  Musculoskeletal: No lower extremity tenderness nor edema. No gross deformities of extremities. No midline lumbar spine tenderness.  Neurologic:  Normal speech and language. No gross focal neurologic  deficits are appreciated. 2+ patellar reflexes. Patient is ambulatory.  Skin:  Skin is warm, dry and intact. No rash noted.   ____________________________________________  RADIOLOGY  None ____________________________________________   PROCEDURES  Procedure(s) performed:   Procedures  None ____________________________________________   INITIAL IMPRESSION / ASSESSMENT AND PLAN / ED COURSE  Pertinent labs & imaging results that were available during my care of the patient were reviewed by me and considered in my medical decision making (see chart for details).  Patient presents to the emergency department for evaluation of lower back pain radiating down the left leg.  No abdominal tenderness on exam.  No midline spine.  2+ reflexes in bilateral patellar tendons.  Normal sensation and strength in the leg.  Patient has a Ashritha Desrosiers history of lower back pain and sciatica.  Extremely low suspicion for vascular etiology of his discomfort.  No indication for emergent MRI or other spine imaging at this time.  Patient has received multiple, short-term, narcotic prescriptions from ED providers in the last 6 months.  I will not prescribe additional opiates but will give a short course of steroids, Robain, and Lidoderm. Referred to local PCP.   At this time, I do not feel there is any life-threatening condition present. I have reviewed and discussed all results (EKG, imaging, lab, urine as appropriate), exam findings with patient. I have reviewed nursing notes and appropriate previous records.  I feel the patient is safe to be discharged home without further emergent workup. Discussed usual and customary return precautions. Patient and family (if present) verbalize understanding and are comfortable with this plan.  Patient will follow-up with their primary care provider. If they do not have a primary care provider, information for follow-up has been provided to them. All questions have been  answered.  ____________________________________________  FINAL CLINICAL IMPRESSION(S) / ED DIAGNOSES  Final diagnoses:  Acute left-sided low back pain with left-sided sciatica     MEDICATIONS GIVEN DURING THIS VISIT:  Medications  methocarbamol (ROBAXIN) tablet 500 mg (500 mg Oral Given 09/20/17 1915)     NEW OUTPATIENT MEDICATIONS STARTED DURING THIS VISIT:  Prednisone, Lidoderm, and Robaxin   Note:  This document was prepared using Dragon voice recognition software and may include unintentional dictation errors.  Nanda Quinton, MD Emergency Medicine    Maximum Reiland, Wonda Olds, MD 09/21/17 [phone]

## 2017-09-22 ENCOUNTER — Other Ambulatory Visit: Payer: Self-pay

## 2017-09-22 NOTE — Patient Outreach (Signed)
[facility]) Care Management  09/22/2017  JERAL ZICK 04-Nov-1945 [id number]  1st Telephone call to patient for ED Utilization screening.  No answer.  HIPAA complaint voice message left with contact information.    Plan:  RN [facility] will make outreach attempt to patient within three business days.  Lazaro Arms RN, BSN, [place] Direct Dial:  [phone] Fax: [phone]

## 2017-09-22 NOTE — Patient Outreach (Signed)
[facility] [place] [facility]) Care Management  09/22/2017  Paul Clayton 1945-11-06 [id number]   Telephone call received from Chyrl Civatte the patients daughter. She states that the number we have listed is hers. She has asked her father not to give out this number because she never sees him.  It is hard for her to get in touch with him. He does not have a permanent address.  I explained to her that I was sorry to call but this is th only number available. I also explained  I am unable to give her any information because I do not have a release of information.  She verbalized understanding and stated that she will try to give him the message. The daughter also asked if this number could not be called.  Plan: RN [facility] made an attempt to contact the patient  there are no alternate numbers, no address to send a letter and no primary listed to call. RN [facility] will close the case at this time.   Lazaro Arms RN, BSN, [place] Direct Dial:  [phone] Fax: [phone]

## 2017-09-25 ENCOUNTER — Ambulatory Visit: Payer: Medicare Other

## 2017-10-29 ENCOUNTER — Other Ambulatory Visit: Payer: Self-pay

## 2017-10-29 ENCOUNTER — Emergency Department (HOSPITAL_COMMUNITY)
Admission: EM | Admit: 2017-10-29 | Discharge: 2017-10-29 | Disposition: A | Discharge status: Home / Self Care | Payer: Medicare Other | Attending: Emergency Medicine | Admitting: Emergency Medicine

## 2017-10-29 ENCOUNTER — Encounter (HOSPITAL_COMMUNITY): Payer: Self-pay | Admitting: Emergency Medicine

## 2017-10-29 DIAGNOSIS — K0889 Other specified disorders of teeth and supporting structures: Secondary | ICD-10-CM | POA: Diagnosis not present

## 2017-10-29 DIAGNOSIS — I509 Heart failure, unspecified: Secondary | ICD-10-CM | POA: Diagnosis not present

## 2017-10-29 DIAGNOSIS — I110 Hypertensive heart disease with heart failure: Secondary | ICD-10-CM | POA: Insufficient documentation

## 2017-10-29 DIAGNOSIS — I2510 Atherosclerotic heart disease of native coronary artery without angina pectoris: Secondary | ICD-10-CM | POA: Insufficient documentation

## 2017-10-29 DIAGNOSIS — Z79899 Other long term (current) drug therapy: Secondary | ICD-10-CM | POA: Diagnosis not present

## 2017-10-29 DIAGNOSIS — J449 Chronic obstructive pulmonary disease, unspecified: Secondary | ICD-10-CM | POA: Insufficient documentation

## 2017-10-29 DIAGNOSIS — K029 Dental caries, unspecified: Secondary | ICD-10-CM | POA: Insufficient documentation

## 2017-10-29 DIAGNOSIS — R0602 Shortness of breath: Secondary | ICD-10-CM | POA: Diagnosis not present

## 2017-10-29 DIAGNOSIS — F17210 Nicotine dependence, cigarettes, uncomplicated: Secondary | ICD-10-CM | POA: Insufficient documentation

## 2017-10-29 DIAGNOSIS — R030 Elevated blood-pressure reading, without diagnosis of hypertension: Secondary | ICD-10-CM | POA: Diagnosis not present

## 2017-10-29 MED ORDER — PENICILLIN V POTASSIUM 500 MG PO TABS
500.0000 mg | ORAL_TABLET | Freq: Once | ORAL | Status: AC
  Administered 2017-10-29: 500 mg via ORAL
  Filled 2017-10-29: qty 1

## 2017-10-29 MED ORDER — LOSARTAN POTASSIUM 25 MG PO TABS: 25.0000 mg | ORAL_TABLET | Freq: Every day | ORAL | 0 refills | Status: DC

## 2017-10-29 MED ORDER — AMIODARONE HCL 200 MG PO TABS: 200.0000 mg | ORAL_TABLET | Freq: Every day | ORAL | 0 refills | Status: DC

## 2017-10-29 MED ORDER — CARVEDILOL 3.125 MG PO TABS
3.1250 mg | ORAL_TABLET | Freq: Two times a day (BID) | ORAL | Status: DC
  Administered 2017-10-29: 3.125 mg via ORAL
  Filled 2017-10-29: qty 1

## 2017-10-29 MED ORDER — PENICILLIN V POTASSIUM 500 MG PO TABS: 500.0000 mg | ORAL_TABLET | Freq: Three times a day (TID) | ORAL | 0 refills | Status: DC

## 2017-10-29 MED ORDER — CARVEDILOL 3.125 MG PO TABS: 3.1250 mg | ORAL_TABLET | Freq: Two times a day (BID) | ORAL | 0 refills | Status: DC

## 2017-10-29 MED ORDER — LOSARTAN POTASSIUM 25 MG PO TABS
25.0000 mg | ORAL_TABLET | Freq: Once | ORAL | Status: AC
  Administered 2017-10-29: 25 mg via ORAL
  Filled 2017-10-29: qty 1

## 2017-10-29 MED ORDER — IBUPROFEN 200 MG PO TABS
600.0000 mg | ORAL_TABLET | Freq: Once | ORAL | Status: AC
  Administered 2017-10-29: 600 mg via ORAL
  Filled 2017-10-29: qty 3

## 2017-10-29 NOTE — ED Triage Notes (Signed)
Patient BIB [facility] from [facility] c/o difficulty breathing and dental pain on rt lower jaw.  Pt reports not taking medications regularly.

## 2017-10-29 NOTE — Discharge Instructions (Addendum)
Follow up with a dentist of your choice for further management of dental pain.

## 2017-10-29 NOTE — ED Notes (Signed)
Bed: WA20 Expected date:  Expected time:  Means of arrival:  Comments: Htn, toothache

## 2017-10-29 NOTE — ED Provider Notes (Signed)
[facility] DEPT Provider Note   CSN: [id number] Arrival date & time: 10/29/17  0206     History   Chief Complaint Chief Complaint  Patient presents with  . Dental Pain  . Shortness of Breath    HPI Paul Clayton is a 72 y.o. male.  Patient here for management of right lower molar pain that was keeping him awake tonight. No facial swelling or fever. No difficulty swelling. He reports SOB but has chronic SOB with history of CHF and denies any acute change. No LE edema. No chest pain.    The history is provided by the patient. No language interpreter was used.  Dental Pain    Shortness of Breath  Pertinent negatives include no fever, no chest pain, no vomiting and no abdominal pain.    Past Medical History:  Diagnosis Date  . Adenocarcinoma of colon ([place])    STAGE 1  . Anxiety   . Arthritis    "left hip" (04/19/2015)  . CAD (coronary artery disease)    a. LHC 1/16:  LM 20%, oLAD 30%, oLCx 30%, pRCA 30%  . Chronic lower back pain   . Chronic systolic CHF (congestive heart failure) (HCC)    a. EF previously 15%; b. Echo 7/16:  EF 30-35%, diff HK, gr 1 DD, mild MR, mild LAE, mild reduced RVSF  . COPD (chronic obstructive pulmonary disease) ([place])   . Depression   . GERD (gastroesophageal reflux disease)   . Hepatitis C   . Hypertension   . Migraine    "last one was back in the 1980's" (04/19/2015)  . NICM (nonischemic cardiomyopathy) ([place])    no obs CAD on LHC in 1/16 - ? HTN or substance abuse  . Persistent atrial fibrillation (HCC)    a. s/p DCCV; b. Amiodarone  . Substance abuse ([place])    ETOH; crack cocaine  . Tobacco abuse     Patient Active Problem List   Diagnosis Date Noted  . Syncope 01/05/2017  . Polysubstance abuse ([place])   . Alcohol abuse   . Sciatica of left side   . Syncope and collapse   . PAF (paroxysmal atrial fibrillation) ([place])   . Non-ischemic cardiomyopathy ([place])   . Health care maintenance 06/20/2016  .  Urinary incontinence 05/03/2015  . LBP radiating to left leg 03/12/2015  . Nonischemic cardiomyopathy ([place]) 03/05/2015  . Hx of substance abuse 10/30/2014  . Faintness   . Homeless single person   . Atrial fibrillation with RVR ([place]) 10/13/2014  . High risk social situation 09/30/2014  . Colon cancer ([place]) 09/26/2014  . HFrEF (heart failure with reduced ejection fraction) ([place]) 09/12/2014  . A-fib ([place]) 09/12/2014  . COPD (chronic obstructive pulmonary disease) ([place]) 09/12/2014    Past Surgical History:  Procedure Laterality Date  . CARDIAC CATHETERIZATION  10/23/2014  . CARDIOVERSION     "I've had 2 in all" (04/19/2015)  . CARDIOVERSION N/A 01/30/2015   Procedure: CARDIOVERSION;  Surgeon: Larey Dresser, MD;  Location: [facility];  Service: Cardiovascular;  Laterality: N/A;  . COLONOSCOPY    . COLONOSCOPY WITH PROPOFOL N/A 11/30/2014   Procedure: COLONOSCOPY WITH PROPOFOL;  Surgeon: Milus Banister, MD;  [facility];  Service: Endoscopy;  Laterality: N/A;  . LEFT AND RIGHT HEART CATHETERIZATION WITH CORONARY ANGIOGRAM N/A 10/23/2014   Procedure: LEFT AND RIGHT HEART CATHETERIZATION WITH CORONARY ANGIOGRAM;  Surgeon: Larey Dresser, MD;  Location: [facility] CATH LAB;  Service: Cardiovascular;  Laterality: N/A;  Home Medications    Prior to Admission medications   Medication Sig Start Date End Date Taking? Authorizing Provider  amiodarone (PACERONE) 200 MG tablet Take 1 tablet (200 mg total) by mouth daily. 08/16/17 09/15/17  Rodell Perna A, PA-C  carvedilol (COREG) 3.125 MG tablet Take 1 tablet (3.125 mg total) by mouth 2 (two) times daily with a meal. 08/16/17 09/15/17  Fawze, Mina A, PA-C  HYDROcodone-acetaminophen (NORCO/VICODIN) 5-325 MG tablet Take 2 tablets at bedtime as needed by mouth for severe pain. 09/06/17   Quintella Reichert, MD  HYDROcodone-acetaminophen (NORCO/VICODIN) 5-325 MG tablet Take 1 tablet by mouth every 4 (four) hours as needed. 09/16/17   Tegeler,  Gwenyth Allegra, MD  lidocaine (LIDODERM) 5 % Place 1 patch onto the skin daily. Remove & Discard patch within 12 hours or as directed by MD 09/20/17   Long, Wonda Olds, MD  losartan (COZAAR) 25 MG tablet Take 1 tablet (25 mg total) by mouth at bedtime. 08/16/17 09/15/17  Rodell Perna A, PA-C  methocarbamol (ROBAXIN) 500 MG tablet Take 1 tablet (500 mg total) by mouth 2 (two) times daily. 09/20/17   Long, Wonda Olds, MD  rivaroxaban (XARELTO) 20 MG TABS tablet Take 1 tablet (20 mg total) by mouth daily with supper. 08/16/17 09/15/17  Renita Papa, PA-C    Family History Family History  Problem Relation Age of Onset  . Hypertension Mother     Social History Social History   Tobacco Use  . Smoking status: Current Every Day Smoker    Packs/day: 0.10    Years: 52.00    Pack years: 5.20    Types: Cigarettes    Last attempt to quit: 09/01/2014    Years since quitting: 3.1  . Smokeless tobacco: Never Used  . Tobacco comment: 06/20/16-started back smoking; pack lasts 1 mo; wants to quit again  Substance Use Topics  . Alcohol use: Yes    Alcohol/week: 0.0 oz    Comment: 1 can beer every other day  . Drug use: Yes    Types: "Crack" cocaine, Marijuana    Comment: 04/18/2017 "I was smoking weed; someone laced it w/crack 04/18/2015; I smoke weed a couple times/yr"     Allergies   Patient has no known allergies.   Review of Systems Review of Systems  Constitutional: Negative for chills and fever.  HENT: Positive for dental problem.   Respiratory: Positive for shortness of breath (See HPI.).   Cardiovascular: Negative.  Negative for chest pain.  Gastrointestinal: Negative.  Negative for abdominal pain, nausea and vomiting.  Musculoskeletal: Negative.   Skin: Negative.   Neurological: Negative.      Physical Exam Updated Vital Signs BP (!) 185/112   Pulse 77   Temp 98.9 F (37.2 C) (Oral)   Resp 20   Ht 6' (1.829 m)   Wt 82.6 kg (182 lb)   SpO2 100%   BMI 24.68 kg/m   Physical  Exam  Constitutional: He is oriented to person, place, and time. He appears well-developed and well-nourished. He does not appear ill.  HENT:  Head: Normocephalic.  Partially edentulous. #29 has significant decay and old filling. No alveolar abscess visualized.   Neck: Normal range of motion.  Cardiovascular: Normal rate.  No murmur heard. Pulmonary/Chest: Effort normal. No respiratory distress. He has no decreased breath sounds. He has no wheezes. He has no rhonchi. He has no rales.  Abdominal: Soft. He exhibits no mass.  Musculoskeletal:       Right lower leg:  Normal. He exhibits no edema.       Left lower leg: Normal. He exhibits no edema.  Neurological: He is alert and oriented to person, place, and time.  Skin: Skin is warm and dry.     ED Treatments / Results  Labs (all labs ordered are listed, but only abnormal results are displayed) Labs Reviewed - No data to display  EKG  EKG Interpretation None       Radiology No results found.  Procedures Procedures (including critical care time)  Medications Ordered in ED Medications  ibuprofen (ADVIL,MOTRIN) tablet 600 mg (not administered)  penicillin v potassium (VEETID) tablet 500 mg (not administered)     Initial Impression / Assessment and Plan / ED Course  I have reviewed the triage vital signs and the nursing notes.  Pertinent labs & imaging results that were available during my care of the patient were reviewed by me and considered in my medical decision making (see chart for details).     Patient presents with c/o dental pain without other new symptoms. No facial swelling or fever. Significant decay of #29, the site of pain. Other right lower molars absent.   He can be discharged home. Will cover with PCN, provide ibuprofen for pain. Recommend dental follow up - resources provided.   Final Clinical Impressions(s) / ED Diagnoses   Final diagnoses:  None   1. Dental pain  ED Discharge Orders    None        Charlann Lange, PA-C 10/29/17 4193    Isla Pence, MD 10/29/17 [phone]

## 2017-10-29 NOTE — ED Notes (Signed)
Pt states BP is elevated bc he has been out of his medications. Pt also states his breathing has improved, chief complaint his pain in his head and tooth.

## 2017-11-05 ENCOUNTER — Encounter: Payer: Self-pay | Admitting: Internal Medicine

## 2017-11-05 ENCOUNTER — Ambulatory Visit (INDEPENDENT_AMBULATORY_CARE_PROVIDER_SITE_OTHER): Payer: Medicare Other | Admitting: Internal Medicine

## 2017-11-05 ENCOUNTER — Other Ambulatory Visit: Payer: Self-pay

## 2017-11-05 VITALS — BP 138/84 | HR 75 | Temp 98.5°F | Ht 72.0 in | Wt 199.0 lb

## 2017-11-05 DIAGNOSIS — J438 Other emphysema: Secondary | ICD-10-CM | POA: Diagnosis not present

## 2017-11-05 DIAGNOSIS — M5442 Lumbago with sciatica, left side: Secondary | ICD-10-CM | POA: Diagnosis not present

## 2017-11-05 DIAGNOSIS — Z23 Encounter for immunization: Secondary | ICD-10-CM

## 2017-11-05 DIAGNOSIS — I10 Essential (primary) hypertension: Secondary | ICD-10-CM | POA: Diagnosis not present

## 2017-11-05 DIAGNOSIS — G8929 Other chronic pain: Secondary | ICD-10-CM

## 2017-11-05 MED ORDER — ALBUTEROL SULFATE HFA 108 (90 BASE) MCG/ACT IN AERS: 2.0000 | INHALATION_SPRAY | Freq: Four times a day (QID) | RESPIRATORY_TRACT | 1 refills | Status: DC | PRN

## 2017-11-05 MED ORDER — GABAPENTIN 300 MG PO CAPS: 300.0000 mg | ORAL_CAPSULE | Freq: Three times a day (TID) | ORAL | 1 refills | Status: DC

## 2017-11-05 NOTE — Assessment & Plan Note (Signed)
Controlled. BP 138/84 in clinic today.  - Continue Coreg and Losartan - Check BMP - Follow-up in 3 months

## 2017-11-05 NOTE — Assessment & Plan Note (Signed)
Stable. Lumbar x-ray 09/16/17 with mild degenerative disc disease and advanced lumbar facet arthropathy similar to previous x-ray. 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 Neuro exam normal today and no back pain red flags. - Patient states that [place] is the only medication that helps him, but I explained that our clinic policy states that we do not prescribe narcotics to patients with polysubstance abuse. Also, do not think that long-term narcotics would be appropriate for this type of condition. - Prescribed Gabapentin 300mg  tid - Continue home rehabilitation exercises - Return precautions discussed - If pain persists, could consider referral to spine specialist - Follow-up if no improvement

## 2017-11-05 NOTE — Patient Instructions (Signed)
It was so nice to see you!  For your back pain and sciatica- I have started Gabapentin. This can make you sleepy so please use it just at night for the first couple of nights. You can then use it three times a day as needed.  For your breathing- please schedule an appointment with Dr. Valentina Lucks for lung function testing. I have sent some Albuterol into your pharmacy.  For your blood pressure- I have ordered some labs for you. I will call you with these results.   -Dr. Brett Albino

## 2017-11-05 NOTE — Assessment & Plan Note (Signed)
Uncontrolled and not on any medications currently. Has been on Combivent and Dulera in the past. Having shortness of breath and orthopnea. No LE edema. Likely a combination of COPD and HFrEF. Lung exam normal today. PFTs last performed in 2015 and showed FVC 77% of predicted and FEV1 69% of predicted. - Repeat Spirometry. Advised patient to schedule an appointment with Dr. Valentina Lucks to have these done. - Restart Albuterol prn. Will likely need an additional medication pending results of PFTs - Has f/u appointment with HF clinic on 1/24 - Return precautions discussed

## 2017-11-05 NOTE — Progress Notes (Signed)
[facility] Phone: [phone]  Subjective:  Paul Clayton is a 72 year old male presenting to clinic with left sided back pain with sciatica, shortness of breath, and HTN.  Left Sided Back Pain with Sciatica: Has been going on for 2 years. Has been seen multiple times in the ED for this. Recent lumbar x-ray with mild degenerative disc disease and advanced lumbar facet arthropathy similar to previous x-ray. 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 Pain is located in the left lower back. Pain radiates down the back of the left leg to the left heel. States his left leg sometimes "gives out". Nothing makes the pain worse, the pain just "comes on out of nowhere". Percocet is the only thing that helps, which was prescribed to him by EDP. He has also tried Ibuprofen and Tylenol, but both of these upset his stomach. No numbness, no tingling. No saddle anesthesia.   COPD: Not on any medications for this. States he used to be on two inhalers, but he can't remember what these are. He stopped taking these when the prescription ran out. He endorses occasional shortness of breath. He also endorses orthopnea. No lower extremity edema. Patient is a current every day smoker. Quit between 2015-2017 but started smoking again in 2017.  HTN: Taking Coreg and Losartan as prescribed. Did not take any BP medications today. Not checking BP at home. No chest pain. No dizziness. No side effects to his BP medications.  ROS: See HPI for pertinent positives and negatives  Past Medical History- A-fib, HFrEF, COPD, hx colon cancer,   Family history reviewed for today's visit. No changes.  Social history- polysubstance abuse with alcohol, cocaine, and marijuana.  Objective: BP 138/84   Pulse 75   Temp  98.5 F (36.9 C) (Oral)   Ht 6' (1.829 m)   Wt 199 lb (90.3 kg)   SpO2 99%   BMI 26.99 kg/m  Gen: NAD, alert, cooperative with exam HEENT: NCAT, EOMI, MMM Neck: FROM, supple CV: RRR, no murmur Resp: Normal work of breathing, mild expiratory wheezing heard throughout all lung fields, no crackles. GI: SNTND, BS present, no guarding or organomegaly Msk: No edema, warm, normal tone, moves UE/LE spontaneously Back: No midline tenderness, +ttp of the left paraspinal muscles in the lumbar spine Hips: Normal ROM, negative FADIR and FADIR Neuro: Alert and oriented, no gross deficits, straight leg raise negative bilaterally, 5/5 strength in lower extremities bilaterally, sensation intact to light touch throughout, reflexes symmetric, normal gait Skin: dry skin on feet/ankles, no other rashes or lesions on exposed skin Psych: Appropriate behavior  Assessment/Plan: Left Sided Low Back Pain with Left Sided Sciatica: Stable. Lumbar x-ray 09/16/17 with mild degenerative disc disease and advanced lumbar facet arthropathy similar to previous x-ray. 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 Neuro exam normal today and no back pain red flags. - Patient states that Ramsey is the only medication that helps him, but I explained that our clinic policy states that we do not prescribe narcotics to patients with polysubstance abuse. Also, do not think that long-term narcotics would be appropriate  for this type of condition. - Prescribed Gabapentin 300mg  tid - Continue home rehabilitation exercises - Return precautions discussed - If pain persists, could consider referral to spine specialist - Follow-up if no improvement   COPD: Uncontrolled and not on any medications currently. Has been on Combivent and Dulera in the past.  Having shortness of breath and orthopnea. No LE edema. Likely a combination of COPD and HFrEF. Lung exam normal today. PFTs last performed in 2015 and showed FVC 77% of predicted and FEV1 69% of predicted. - Repeat Spirometry. Advised patient to schedule an appointment with Dr. Valentina Lucks to have these done. - Restart Albuterol prn. Will likely need an additional medication pending results of PFTs - Has f/u appointment with HF clinic on 1/24 - Return precautions discussed  HTN: Controlled. BP 138/84 in clinic today.  - Continue Coreg and Losartan - Check BMP - Follow-up in 3 months   Hyman Bible, MD PGY-3

## 2017-11-06 LAB — BASIC METABOLIC PANEL
BUN / CREAT RATIO: 14 (ref 10–24)
BUN: 14 mg/dL (ref 8–27)
CO2: 23 mmol/L (ref 20–29)
CREATININE: 1.01 mg/dL (ref 0.76–1.27)
Calcium: 9 mg/dL (ref 8.6–10.2)
Chloride: 105 mmol/L (ref 96–106)
GFR, EST AFRICAN AMERICAN: 87 mL/min/{1.73_m2} (ref >59)
GFR, EST NON AFRICAN AMERICAN: 75 mL/min/{1.73_m2} (ref >59)
Glucose: 84 mg/dL (ref 65–99)
Potassium: 4.5 mmol/L (ref 3.5–5.2)
Sodium: 143 mmol/L (ref 134–144)

## 2017-11-12 ENCOUNTER — Ambulatory Visit (HOSPITAL_COMMUNITY)
Admission: RE | Admit: 2017-11-12 | Discharge: 2017-11-12 | Disposition: A | Discharge status: Home / Self Care | Payer: Medicare Other | Source: Ambulatory Visit | Attending: Internal Medicine | Admitting: Internal Medicine

## 2017-11-12 ENCOUNTER — Encounter (HOSPITAL_COMMUNITY): Payer: Self-pay

## 2017-11-12 ENCOUNTER — Emergency Department (HOSPITAL_COMMUNITY)
Admission: EM | Admit: 2017-11-12 | Discharge: 2017-11-12 | Discharge status: Against Medical Advice | Payer: Medicare Other

## 2017-11-12 ENCOUNTER — Other Ambulatory Visit: Payer: Self-pay

## 2017-11-12 VITALS — BP 164/98 | HR 94 | Wt 198.0 lb

## 2017-11-12 DIAGNOSIS — E785 Hyperlipidemia, unspecified: Secondary | ICD-10-CM | POA: Insufficient documentation

## 2017-11-12 DIAGNOSIS — Z609 Problem related to social environment, unspecified: Secondary | ICD-10-CM

## 2017-11-12 DIAGNOSIS — I5022 Chronic systolic (congestive) heart failure: Secondary | ICD-10-CM

## 2017-11-12 DIAGNOSIS — I2510 Atherosclerotic heart disease of native coronary artery without angina pectoris: Secondary | ICD-10-CM | POA: Diagnosis not present

## 2017-11-12 DIAGNOSIS — J449 Chronic obstructive pulmonary disease, unspecified: Secondary | ICD-10-CM | POA: Diagnosis not present

## 2017-11-12 DIAGNOSIS — I480 Paroxysmal atrial fibrillation: Secondary | ICD-10-CM | POA: Diagnosis not present

## 2017-11-12 DIAGNOSIS — F1910 Other psychoactive substance abuse, uncomplicated: Secondary | ICD-10-CM | POA: Diagnosis not present

## 2017-11-12 DIAGNOSIS — K219 Gastro-esophageal reflux disease without esophagitis: Secondary | ICD-10-CM | POA: Insufficient documentation

## 2017-11-12 DIAGNOSIS — C189 Malignant neoplasm of colon, unspecified: Secondary | ICD-10-CM | POA: Diagnosis not present

## 2017-11-12 DIAGNOSIS — I10 Essential (primary) hypertension: Secondary | ICD-10-CM | POA: Diagnosis not present

## 2017-11-12 DIAGNOSIS — Z9114 Patient's other noncompliance with medication regimen: Secondary | ICD-10-CM | POA: Insufficient documentation

## 2017-11-12 DIAGNOSIS — Z9889 Other specified postprocedural states: Secondary | ICD-10-CM | POA: Insufficient documentation

## 2017-11-12 DIAGNOSIS — I428 Other cardiomyopathies: Secondary | ICD-10-CM

## 2017-11-12 DIAGNOSIS — F17210 Nicotine dependence, cigarettes, uncomplicated: Secondary | ICD-10-CM | POA: Insufficient documentation

## 2017-11-12 DIAGNOSIS — F1410 Cocaine abuse, uncomplicated: Secondary | ICD-10-CM | POA: Insufficient documentation

## 2017-11-12 DIAGNOSIS — Z7901 Long term (current) use of anticoagulants: Secondary | ICD-10-CM | POA: Diagnosis not present

## 2017-11-12 DIAGNOSIS — I110 Hypertensive heart disease with heart failure: Secondary | ICD-10-CM | POA: Insufficient documentation

## 2017-11-12 DIAGNOSIS — Z79899 Other long term (current) drug therapy: Secondary | ICD-10-CM | POA: Insufficient documentation

## 2017-11-12 LAB — BASIC METABOLIC PANEL
ANION GAP: 10 (ref 5–15)
BUN: 15 mg/dL (ref 6–20)
CHLORIDE: 105 mmol/L (ref 101–111)
CO2: 25 mmol/L (ref 22–32)
Calcium: 9 mg/dL (ref 8.9–10.3)
Creatinine, Ser: 0.91 mg/dL (ref 0.61–1.24)
Glucose, Bld: 95 mg/dL (ref 65–99)
POTASSIUM: 4.6 mmol/L (ref 3.5–5.1)
SODIUM: 140 mmol/L (ref 135–145)

## 2017-11-12 MED ORDER — AMIODARONE HCL 200 MG PO TABS: 200.0000 mg | ORAL_TABLET | Freq: Every day | ORAL | 3 refills | Status: DC

## 2017-11-12 MED ORDER — LOSARTAN POTASSIUM 25 MG PO TABS: 25.0000 mg | ORAL_TABLET | Freq: Two times a day (BID) | ORAL | 3 refills | Status: DC

## 2017-11-12 MED ORDER — CARVEDILOL 3.125 MG PO TABS: 3.1250 mg | ORAL_TABLET | Freq: Two times a day (BID) | ORAL | 3 refills | Status: DC

## 2017-11-12 MED ORDER — RIVAROXABAN 20 MG PO TABS: 20.0000 mg | ORAL_TABLET | Freq: Every day | ORAL | 3 refills | Status: DC

## 2017-11-12 NOTE — Progress Notes (Signed)
Patient ID: Paul Clayton, male   DOB: 1946/05/20, 72 y.o.   MRN: [id number]     Advanced Heart Failure Clinic Note   PCP: Dr. Brett Albino Cardiology: Dr. Mickie Bail is a 72 y.o. male with history of COPD, ETOH and cocaine abuse, HCV, colon cancer, paroxysmal atrial fibrillation, and chronic systolic CHF.  He says that he has had a cardiomyopathy known since 2012. This has been presumed to be due to cocaine and ETOH abuse. He says that he quit ETOH, cocaine, and smoking in 10/15.  He was admitted to [facility] in 12/15 with atrial fibrillation and RVR. He had been out of his Coreg x 2 weeks.  He was diuresed and rate-controlled.  He had had an episode of syncope prior to admission that was thought to be orthostatic given low BP in the setting of atrial fibrillation with RVR.  Echo in 11/15 showed EF 15% with severe MR and TR and moderately decreased RV systolic function.    He had a colonoscopy in 11/15 with a mass found, biopsy showed adenocarcinoma.  Most recent GI evaluation suggests that he will not need colectomy.   LHC/RHC in 1/16 showed no obstructive CAD and mildly elevated filling pressures.  Lasix was increased to 40 mg daily. He was admitted earlier in 1/16 with syncope, possibly defecation syncope.  He wore a Lifevest for a period of time but no longer (ICD not placed given episodes of cocaine use ongoing).  In 4/16, he underwent DCCV to NSR.  He was bradycardic, so Coreg was stopped.    He was admitted in 7/16 with cocaine-related chest pain.    Today pt presents for regular follow up. Was jailed from 03/2017 -> 07/2017. He has been feeling OK overall. Primary complaint is hip pain from long history of sciatica. He states that he has been taking his medications, but bottles today all say filled in 07/2017. He had been travelling back and forth between [place] and not always taking his medications.  Has occasional SOB with exertion, but more limited by his joint pain.  Smoking 4-5  cigarettes per day. Denies drug use.   Labs (12/15): K 4.3, creatinine 0.82, AST normal, ALT 60, HCT 40.9, digoxin 0.7, BNP 4401, HIV negative Labs (10/28/2014): K 4.5 Creatinine 0.90, digoxin 0.9, LFTs normal  Labs (1/16): K 4.3, creatinine 1.11, HCT 41.5, BNP 255, digoxin 0.6 Labs (12/06/2014): K 4.3 Creatinine 1.18  Labs (4/16): AST 53, ALT 60 Labs (5/16): K 4.3, creatinine 1.17, digoxin 0.7, HCT 36.6, BNP 97.5 Labs (7/16): AST 46, ALT 49, TSH normal Labs (9/16): K 4.7, creatinine 0.93, HCT 39.7 Labs (2/17): K 5, creatinine 0.85, HCT 58.8 Labs (06/25/2016): K 4.3 Creatinine 0.89 BNP 65   PMH: 1. COPD: Quit smoking in 10/15 but now back to smoking a few cigarettes/day.  2. Cocaine abuse: Still uses occasionally.   3. ETOH abuse: Quit ETOH in 10/15.  4. Atrial fibrillation: Paroxysmal.  Reports DCCV x 2 in [place].  DCCV 4/16 => bradycardic => cut back Coreg.  5. HCV 6. 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  PET scan in 12/15: probably no metastatic disease.   7. H/o syncope: Thought to be orthostatic. Also had vagal-type syncope associated with defecation.  8. Cardiomyopathy: Known since 2012.  Echo (11/15) with EF 15%, diffuse hypokinesis, severe LV dilation, severe MR (likely functional), moderate to severe LAE, dilated RV with moderately decreased systolic function, severe TR.  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  HIV negative.  LHC/RHC (1/16) with nonobstructive CAD, EF 20-25%, mean RA 9, PA 44/26, mean PCWP 21, CI 2.1.  Echo (7/16) with EF 30-35%, mildly dilated LV, mildly decreased RV systolic function.   9. GERD 10. Sciatica 11. H/o left femur fracture 12. ECHO EF 25-30%.   SH: Prior ETOH abuse. Still uses cocaine.   Still smokes a few cigarettes/day.  Moved here from [place].  Daughter lives in [place].   FH: No premature CAD.   Review of systems complete and found to be negative unless listed in HPI.     Current Outpatient Medications  Medication Sig Dispense Refill  . amiodarone (PACERONE) 200 MG tablet Take 1 tablet (200 mg total) by mouth daily for 15 days. 15 tablet 0  . carvedilol (COREG) 3.125 MG tablet Take 1 tablet (3.125 mg total) by mouth 2 (two) times daily with a meal for 15 days. 30 tablet 0  . gabapentin (NEURONTIN) 300 MG capsule Take 1 capsule (300 mg total) by mouth 3 (three) times daily. 90 capsule 1  . losartan (COZAAR) 25 MG tablet Take 1 tablet (25 mg total) by mouth at bedtime for 15 days. 15 tablet 0  . rivaroxaban (XARELTO) 20 MG TABS tablet Take 1 tablet (20 mg total) by mouth daily with supper. 30 tablet 0  . albuterol (PROVENTIL HFA;VENTOLIN HFA) 108 (90 Base) MCG/ACT inhaler Inhale 2 puffs into the lungs every 6 (six) hours as needed for wheezing or shortness of breath. 1 Inhaler 1  . lidocaine (LIDODERM) 5 % Place 1 patch onto the skin daily. Remove & Discard patch within 12 hours or as directed by MD 30 patch 0  . penicillin v potassium (VEETID) 500 MG tablet Take 1 tablet (500 mg total) by mouth 3 (three) times daily. 30 tablet 0   No current facility-administered medications for this encounter.    Vitals:   11/12/17 1509  BP: (!) 164/98  Pulse: 94  SpO2: 98%  Weight: 198 lb (89.8 kg)   Wt Readings from Last 3 Encounters:  11/12/17 198 lb (89.8 kg)  11/05/17 199 lb (90.3 kg)  10/29/17 182 lb (82.6 kg)    General: Well appearing. No resp difficulty. HEENT: Normal Neck: Supple. JVP 5-6. Carotids 2+ bilat; no bruits. No thyromegaly or nodule noted. Cor: PMI nondisplaced. RRR, No M/G/R noted Lungs: CTAB, normal effort. Abdomen: Soft, non-tender, non-distended, no HSM. No bruits or masses. +BS  Extremities: No cyanosis, clubbing, or rash. R and LLE no edema.  Neuro: Alert & orientedx3, cranial nerves grossly intact. moves all 4 extremities w/o difficulty. Affect pleasant   Assessment/Plan: 1. Chronic systolic CHF: Cardiomyopathy with LV EF 30-35% and  mild RV dysfunction on 7/16 echo. 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  HIV negative.   - Echo 05/2017 LVEF 30-35%. No ICD with improved EF.   - NYHA III, but confounded by his severe joint/hip pain - Volume status stable on exam.   - Continue coreg 3.125 mg twice a day.  - Increase losartan to 25 mg BID. BMET today - No spiro for now with non-compliance. Can consider at later date.  - Reinforced fluid restriction to < 2 L daily, sodium restriction to less than 2000 mg daily, and the importance of daily weights.   2. Atrial fibrillation: s/p DCCV - Regular on exam.  -Continue amiodarone 200 daily. LFTs and TSH in June 2018. Check at next visit.  - Continue Xarelto.  Denies bleeding.  3. COPD:  - Encouraged complete tobacco cessation. Smoking 4-5 cigarettes per day.  4. Colon cancer:  - Previous with + cancerous polyp removed. PET scan in 2015 negative. Needs to follow up with PCP.  No change.  5. Hyperlipidemia:  - Continue atorvastatin. 6. Cocaine Abuse - He has not used since prior to going into prison.  7. Hypertension - Will adjust meds in setting of treating his HF as above.   Meds and labs as above. RTC 2-3 weeks for Pharm-D led med titration.   Shirley Friar, PA-C  11/12/2017  Greater than 50% of the 25 minute visit was spent in counseling/coordination of care regarding disease state education, salt/fluid restriction, sliding scale diuretics, and medication compliance.

## 2017-11-12 NOTE — Patient Instructions (Signed)
INCREASE Losartan to 25 mg, one tab twice a day  Labs today We will only contact you if something comes back abnormal or we need to make some changes. Otherwise no news is good news!   Your physician recommends that you schedule a follow-up appointment in: 2-3 weeks with Erika,PharmD  Your physician recommends that you schedule a follow-up appointment in: 4-6 weeks with Rebecca Eaton   Do the following things EVERYDAY: 1) Weigh yourself in the morning before breakfast. Write it down and keep it in a log. 2) Take your medicines as prescribed 3) Eat low salt foods-Limit salt (sodium) to 2000 mg per day.  4) Stay as active as you can everyday 5) Limit all fluids for the day to less than 2 liters

## 2017-11-12 NOTE — ED Notes (Signed)
Called for triage, no response.

## 2017-12-02 ENCOUNTER — Ambulatory Visit (HOSPITAL_COMMUNITY)
Admission: RE | Admit: 2017-12-02 | Discharge: 2017-12-02 | Disposition: A | Discharge status: Home / Self Care | Payer: Medicare Other | Source: Ambulatory Visit | Attending: Cardiology | Admitting: Cardiology

## 2017-12-02 ENCOUNTER — Encounter (HOSPITAL_COMMUNITY): Payer: Self-pay

## 2017-12-02 VITALS — BP 170/88 | HR 77 | Wt 200.0 lb

## 2017-12-02 DIAGNOSIS — F1410 Cocaine abuse, uncomplicated: Secondary | ICD-10-CM | POA: Insufficient documentation

## 2017-12-02 DIAGNOSIS — I428 Other cardiomyopathies: Secondary | ICD-10-CM | POA: Diagnosis not present

## 2017-12-02 DIAGNOSIS — I480 Paroxysmal atrial fibrillation: Secondary | ICD-10-CM | POA: Diagnosis not present

## 2017-12-02 DIAGNOSIS — I110 Hypertensive heart disease with heart failure: Secondary | ICD-10-CM | POA: Insufficient documentation

## 2017-12-02 DIAGNOSIS — I5022 Chronic systolic (congestive) heart failure: Secondary | ICD-10-CM | POA: Diagnosis not present

## 2017-12-02 DIAGNOSIS — F17210 Nicotine dependence, cigarettes, uncomplicated: Secondary | ICD-10-CM | POA: Diagnosis not present

## 2017-12-02 DIAGNOSIS — E785 Hyperlipidemia, unspecified: Secondary | ICD-10-CM | POA: Diagnosis not present

## 2017-12-02 DIAGNOSIS — J449 Chronic obstructive pulmonary disease, unspecified: Secondary | ICD-10-CM | POA: Insufficient documentation

## 2017-12-02 DIAGNOSIS — Z7901 Long term (current) use of anticoagulants: Secondary | ICD-10-CM | POA: Insufficient documentation

## 2017-12-02 DIAGNOSIS — Z85038 Personal history of other malignant neoplasm of large intestine: Secondary | ICD-10-CM | POA: Insufficient documentation

## 2017-12-02 LAB — COMPREHENSIVE METABOLIC PANEL
ALK PHOS: 63 U/L (ref 38–126)
ALT: 21 U/L (ref 17–63)
AST: 27 U/L (ref 15–41)
Albumin: 3.2 g/dL — ABNORMAL LOW (ref 3.5–5.0)
Anion gap: 8 (ref 5–15)
BILIRUBIN TOTAL: 0.3 mg/dL (ref 0.3–1.2)
BUN: 12 mg/dL (ref 6–20)
CALCIUM: 9 mg/dL (ref 8.9–10.3)
CO2: 25 mmol/L (ref 22–32)
Chloride: 107 mmol/L (ref 101–111)
Creatinine, Ser: 0.77 mg/dL (ref 0.61–1.24)
GFR calc Af Amer: >60 mL/min (ref >60)
GFR calc non Af Amer: >60 mL/min (ref >60)
Glucose, Bld: 96 mg/dL (ref 65–99)
Potassium: 4.1 mmol/L (ref 3.5–5.1)
Sodium: 140 mmol/L (ref 135–145)
TOTAL PROTEIN: 6.6 g/dL (ref 6.5–8.1)

## 2017-12-02 LAB — TSH: TSH: 0.882 u[IU]/mL (ref 0.350–4.500)

## 2017-12-02 MED ORDER — SACUBITRIL-VALSARTAN 49-51 MG PO TABS: 1.0000 | ORAL_TABLET | Freq: Two times a day (BID) | ORAL | 5 refills | Status: DC

## 2017-12-02 NOTE — Progress Notes (Signed)
HF MD: [facility]  HPI:  Paul Clayton is a 72 y.o. African American male with history of COPD, ETOH and cocaine abuse, HCV, colon cancer, paroxysmal atrial fibrillation, and chronic systolic CHF.  He says that he has had a cardiomyopathy known since 2012. This has been presumed to be due to cocaine and ETOH abuse. He says that he quit ETOH, cocaine, and smoking in 10/15.  He was admitted to [facility] in 12/15 with atrial fibrillation and RVR. He had been out of his Coreg x 2 weeks.  He was diuresed and rate-controlled.  He had had an episode of syncope prior to admission that was thought to be orthostatic given low BP in the setting of atrial fibrillation with RVR.  Echo in 11/15 showed EF 15% with severe MR and TR and moderately decreased RV systolic function.    He had a colonoscopy in 11/15 with a mass found, biopsy showed adenocarcinoma.  Most recent GI evaluation suggests that he will not need colectomy.   LHC/RHC in 1/16 showed no obstructive CAD and mildly elevated filling pressures.  Lasix was increased to 40 mg daily. He was admitted earlier in 1/16 with syncope, possibly defecation syncope.  He wore a Lifevest for a period of time but no longer (ICD not placed given episodes of cocaine use ongoing).  In 4/16, he underwent DCCV to NSR.  He was bradycardic, so Coreg was stopped.    He was admitted in 7/16 with cocaine-related chest pain.    Today pt presents for pharmacist-led HF medication titration. At last HF clinic visit on 11/12/17, his losartan was increased to 25 mg BID. Was jailed from 03/2017 -> 07/2017. Now living in a shelter. He lost his cell phone but will be getting a new one at the beginning of next month. He has been feeling well overall. Walks at least 1 mile everyday. Not limited by HF, mostly limited by sciatica. He states that he has been taking his medications and all bottles were in date. Continues to smoke cigarettes but down to ~5 every MONTH. Denies drug use.     Marland Kitchen Shortness of breath/dyspnea on exertion? no  . Orthopnea/PND? no . Edema? no . Lightheadedness/dizziness? no . Daily weights at home? no . Blood pressure/heart rate monitoring at home? no . Following low-sodium/fluid-restricted diet? yes  HF Medications: Carvedilol 3.125 mg PO BID Losartan 25 mg PO BID  Has the patient been experiencing any side effects to the medications prescribed?  no  Does the patient have any problems obtaining medications due to transportation or finances?   No - Has Medicare Part D + [place] [employer] -- verified $1.30/mo for Entresto with [employer]   Understanding of regimen: fair Understanding of indications: fair Potential of compliance: good Patient understands to avoid NSAIDs. Patient understands to avoid decongestants.    Pertinent Lab Values: . 12/02/17: Serum creatinine 0.77, BUN 12, Potassium 4.1, Sodium 140 . 12/02/17: LFTs and TSH wnl   Vital Signs: . Weight: 200 lb (last HF clinic visit weight: 198 lb) . Blood pressure: 170/88 mmHg  . Heart rate: 77 bpm    Assessment: 1. Chronicsystolic CHF (EF 55-73%), due to NICM (?HTN). NYHA class IIIsymptoms (confounded by joint/hip pain).  - Volume status stable  - Switch from losartan to Entresto 49-51 mg BID for better BP control and morbidity/mortality benefit in HF   - Continue carvedilol 3.125 mg BID - Basic disease state pathophysiology, medication indication, mechanism and side effects reviewed at length  with patient and he verbalized understanding 2. Atrial fibrillation: s/p DCCV - Regular on exam.  -Continue amiodarone 200 daily.   - LFTs and TSH today wnl - Continue Xarelto.  Denies bleeding.  3. COPD:  - Encouraged complete tobacco cessation - Smoking ~ 5 cigarettes per MONTH now. Congratulated on decreased use 4. Colon cancer:  - Previous with + cancerous polyp removed. PET scan in 2015 negative. Needs to follow up with PCP.  No change.  5. Hyperlipidemia:  - Out of  atorvastatin  6. Cocaine Abuse - He has not used since prior to going into prison 7. Hypertension - BP remains elevated despite patient reported compliance with medications - Switch from losartan to [facility] as above   Plan: 1) Medication changes: Based on clinical presentation, vital signs and recent labs will switch from losartan to Entresto 49-51 mg BID 2) Labs: CMET/TSH today  3) Follow-up: PA/NP on 12/24/17    Doroteo Bradford K. Velva Harman, PharmD, BCPS, CPP Clinical Pharmacist Pager: [phone] Phone: [phone] 12/02/2017 1:41 PM

## 2017-12-02 NOTE — Patient Instructions (Addendum)
It was great to see you today!  Please STOP losartan.   Please START Entresto 49-51 mg TWICE DAILY.   Blood work today. We will call you with any changes.   Please keep your appointment with Carin Hock on Thursday 12/24/17 at 3:00 PM.

## 2017-12-14 ENCOUNTER — Other Ambulatory Visit: Payer: Self-pay | Admitting: Internal Medicine

## 2017-12-24 ENCOUNTER — Encounter (HOSPITAL_COMMUNITY): Payer: Medicare Other

## 2018-04-14 ENCOUNTER — Other Ambulatory Visit (HOSPITAL_COMMUNITY): Payer: Self-pay

## 2018-04-14 IMAGING — CR DG CHEST 2V
2 series · 2 of 2 positions shown · non-contrast
Comparison: Chest radiograph dated 08/25/2016

CLINICAL DATA: 70-year-old male with chest pain and shortness of
breath.

EXAM:
CHEST  2 VIEW

[chest lat]
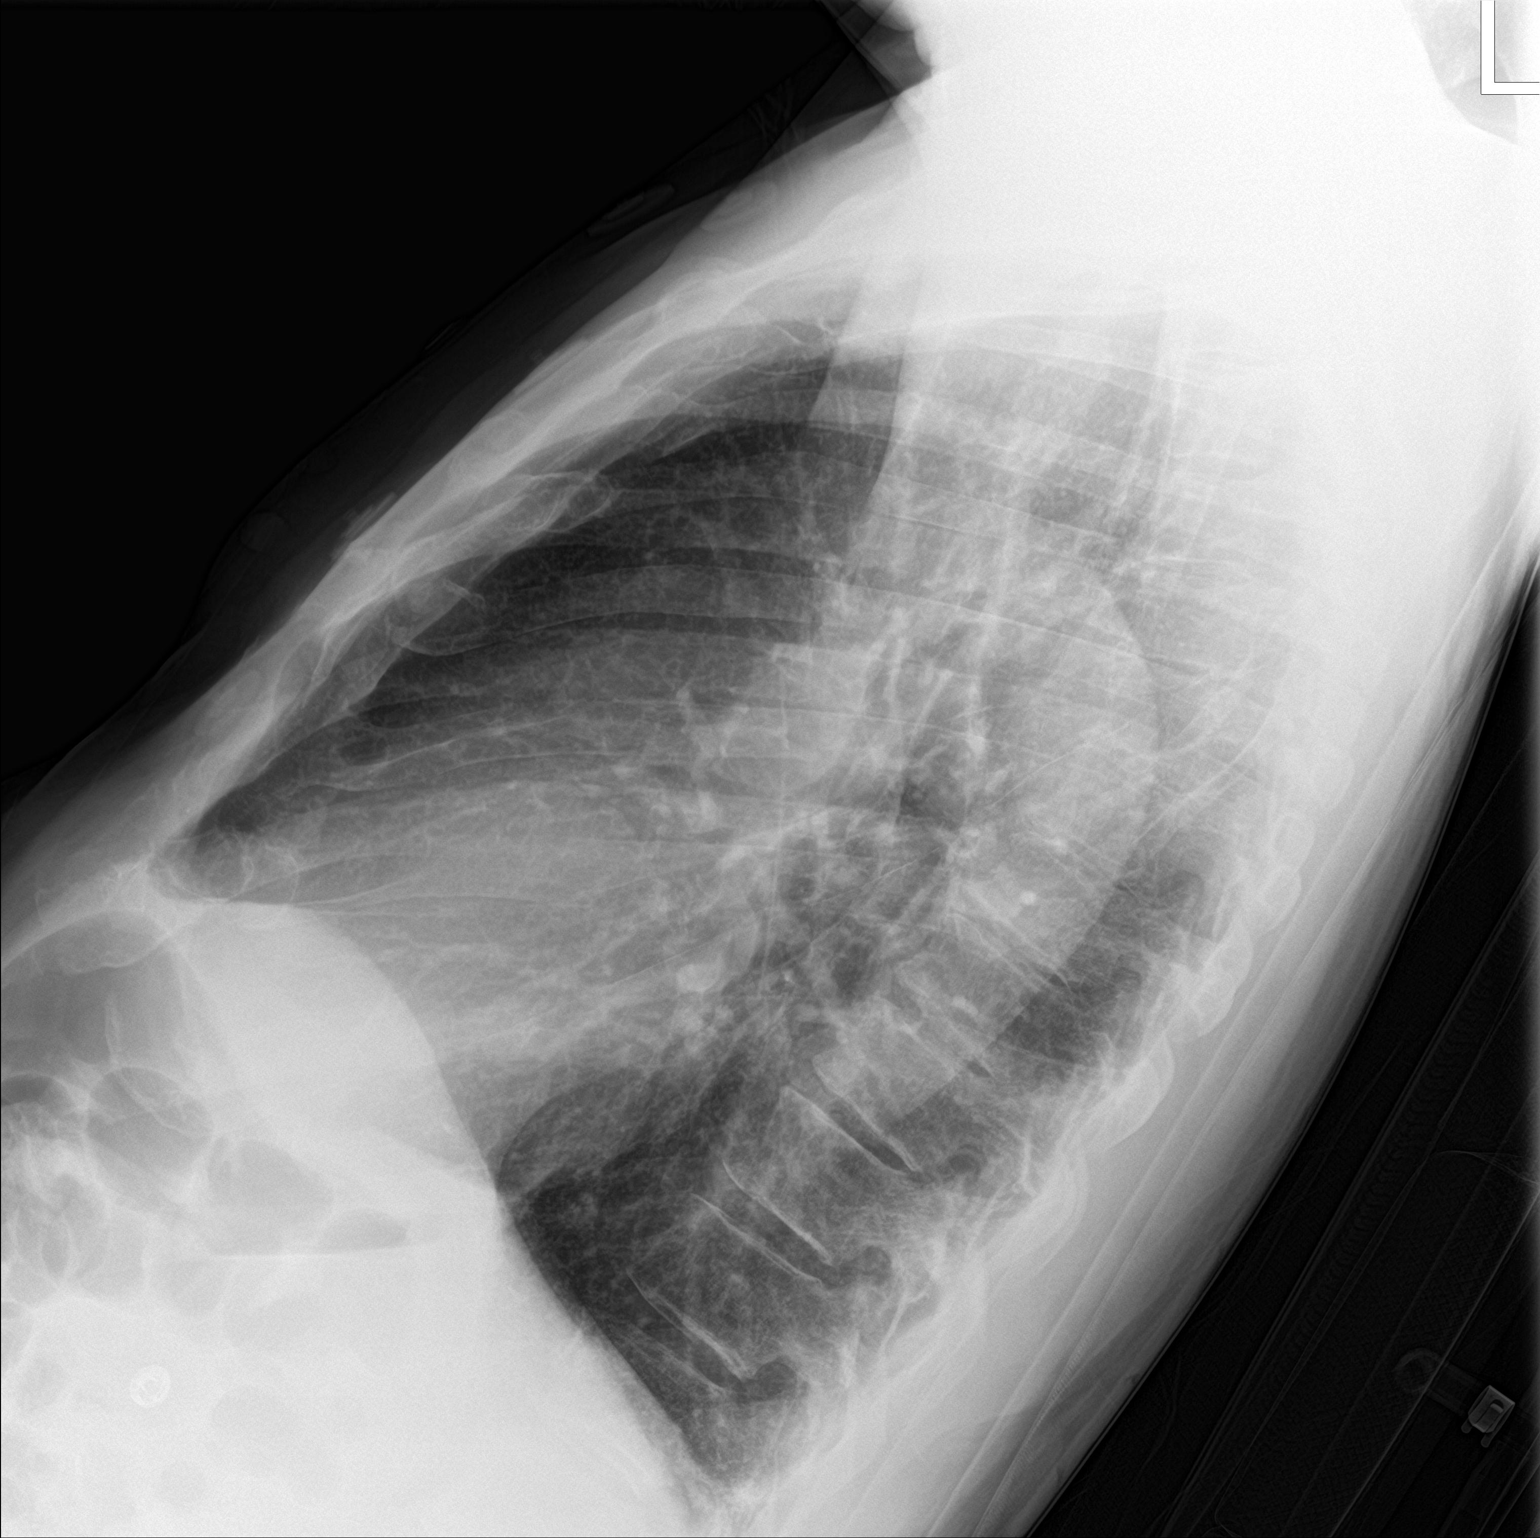

[chest ap]
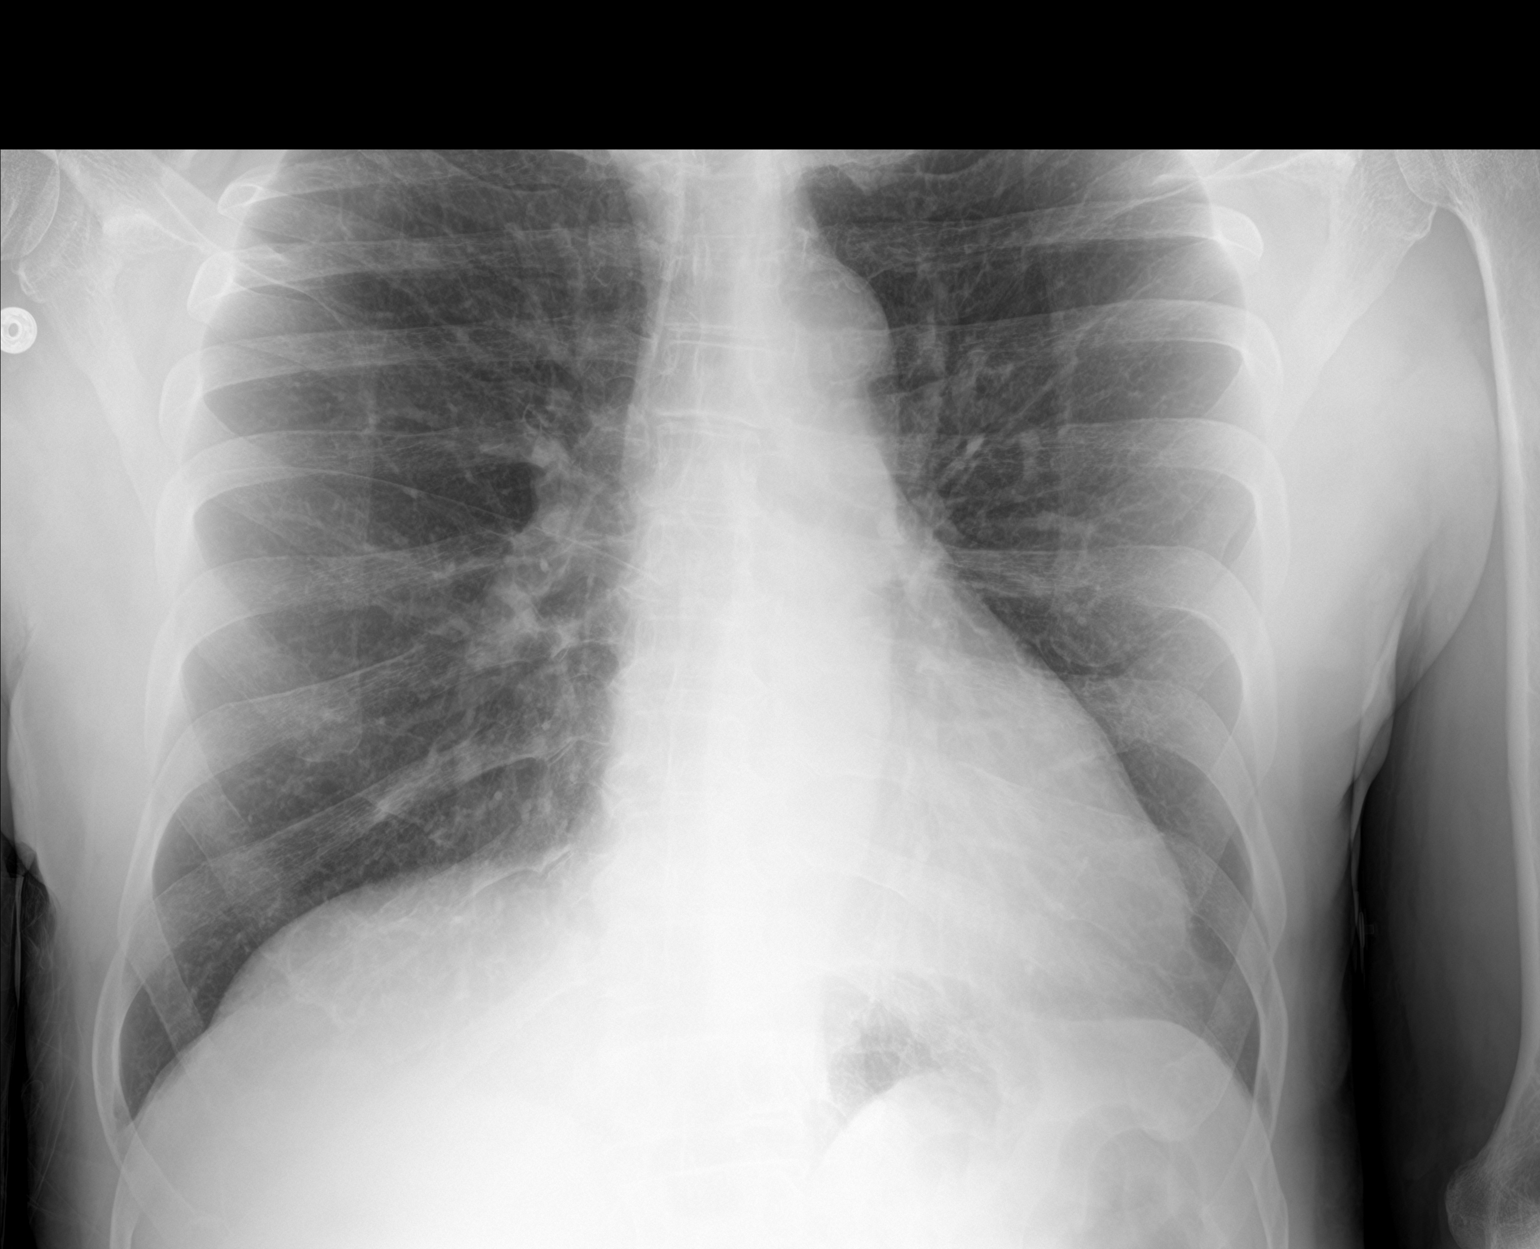

[2 of 2 positions shown; findings below may reference images not displayed]

FINDINGS: The heart size and mediastinal contours are within normal limits.
Both lungs are clear. The visualized skeletal structures are
unremarkable.
IMPRESSION: No active cardiopulmonary disease.

## 2018-04-14 MED ORDER — AMIODARONE HCL 200 MG PO TABS: 200.0000 mg | ORAL_TABLET | Freq: Every day | ORAL | 5 refills | Status: DC

## 2018-04-14 MED ORDER — RIVAROXABAN 20 MG PO TABS: 20.0000 mg | ORAL_TABLET | Freq: Every day | ORAL | 5 refills | Status: DC

## 2018-04-14 MED ORDER — CARVEDILOL 3.125 MG PO TABS: 3.1250 mg | ORAL_TABLET | Freq: Two times a day (BID) | ORAL | 5 refills | Status: DC

## 2018-06-25 ENCOUNTER — Other Ambulatory Visit (HOSPITAL_COMMUNITY): Payer: Self-pay | Admitting: Cardiology

## 2018-07-01 DIAGNOSIS — Z79899 Other long term (current) drug therapy: Secondary | ICD-10-CM | POA: Diagnosis not present

## 2018-07-01 DIAGNOSIS — I110 Hypertensive heart disease with heart failure: Secondary | ICD-10-CM | POA: Diagnosis not present

## 2018-07-01 DIAGNOSIS — F17210 Nicotine dependence, cigarettes, uncomplicated: Secondary | ICD-10-CM | POA: Diagnosis not present

## 2018-07-01 DIAGNOSIS — E785 Hyperlipidemia, unspecified: Secondary | ICD-10-CM | POA: Diagnosis not present

## 2018-07-01 DIAGNOSIS — S022XXA Fracture of nasal bones, initial encounter for closed fracture: Secondary | ICD-10-CM | POA: Diagnosis not present

## 2018-07-01 DIAGNOSIS — M542 Cervicalgia: Secondary | ICD-10-CM | POA: Diagnosis not present

## 2018-07-01 DIAGNOSIS — R51 Headache: Secondary | ICD-10-CM | POA: Diagnosis not present

## 2018-07-01 DIAGNOSIS — H5789 Other specified disorders of eye and adnexa: Secondary | ICD-10-CM | POA: Diagnosis not present

## 2018-07-01 DIAGNOSIS — S0003XA Contusion of scalp, initial encounter: Secondary | ICD-10-CM | POA: Diagnosis not present

## 2018-07-01 DIAGNOSIS — S0990XA Unspecified injury of head, initial encounter: Secondary | ICD-10-CM | POA: Diagnosis not present

## 2018-07-01 DIAGNOSIS — I509 Heart failure, unspecified: Secondary | ICD-10-CM | POA: Diagnosis not present

## 2018-07-01 DIAGNOSIS — S199XXA Unspecified injury of neck, initial encounter: Secondary | ICD-10-CM | POA: Diagnosis not present

## 2018-07-01 DIAGNOSIS — I4891 Unspecified atrial fibrillation: Secondary | ICD-10-CM | POA: Diagnosis not present

## 2018-07-01 DIAGNOSIS — S0081XA Abrasion of other part of head, initial encounter: Secondary | ICD-10-CM | POA: Diagnosis not present

## 2018-07-01 DIAGNOSIS — I10 Essential (primary) hypertension: Secondary | ICD-10-CM | POA: Diagnosis not present

## 2018-07-01 DIAGNOSIS — Z7952 Long term (current) use of systemic steroids: Secondary | ICD-10-CM | POA: Diagnosis not present

## 2018-07-01 DIAGNOSIS — J449 Chronic obstructive pulmonary disease, unspecified: Secondary | ICD-10-CM | POA: Diagnosis not present

## 2018-07-01 DIAGNOSIS — S0001XA Abrasion of scalp, initial encounter: Secondary | ICD-10-CM | POA: Diagnosis not present

## 2018-07-01 DIAGNOSIS — S098XXA Other specified injuries of head, initial encounter: Secondary | ICD-10-CM | POA: Diagnosis not present

## 2018-07-01 DIAGNOSIS — Z23 Encounter for immunization: Secondary | ICD-10-CM | POA: Diagnosis not present

## 2018-07-01 DIAGNOSIS — Z7901 Long term (current) use of anticoagulants: Secondary | ICD-10-CM | POA: Diagnosis not present

## 2018-09-28 ENCOUNTER — Other Ambulatory Visit (HOSPITAL_COMMUNITY): Payer: Self-pay | Admitting: Student

## 2018-09-28 ENCOUNTER — Other Ambulatory Visit (HOSPITAL_COMMUNITY): Payer: Self-pay | Admitting: Cardiology

## 2018-10-05 DIAGNOSIS — E785 Hyperlipidemia, unspecified: Secondary | ICD-10-CM | POA: Diagnosis not present

## 2018-10-05 DIAGNOSIS — K068 Other specified disorders of gingiva and edentulous alveolar ridge: Secondary | ICD-10-CM | POA: Diagnosis not present

## 2018-10-05 DIAGNOSIS — M79605 Pain in left leg: Secondary | ICD-10-CM | POA: Diagnosis not present

## 2018-10-05 DIAGNOSIS — F17210 Nicotine dependence, cigarettes, uncomplicated: Secondary | ICD-10-CM | POA: Diagnosis not present

## 2018-10-05 DIAGNOSIS — K1379 Other lesions of oral mucosa: Secondary | ICD-10-CM | POA: Diagnosis not present

## 2018-10-05 DIAGNOSIS — K0889 Other specified disorders of teeth and supporting structures: Secondary | ICD-10-CM | POA: Diagnosis not present

## 2018-10-05 DIAGNOSIS — I4891 Unspecified atrial fibrillation: Secondary | ICD-10-CM | POA: Diagnosis not present

## 2018-10-05 DIAGNOSIS — J449 Chronic obstructive pulmonary disease, unspecified: Secondary | ICD-10-CM | POA: Diagnosis not present

## 2018-10-05 DIAGNOSIS — Z7952 Long term (current) use of systemic steroids: Secondary | ICD-10-CM | POA: Diagnosis not present

## 2018-10-05 DIAGNOSIS — K047 Periapical abscess without sinus: Secondary | ICD-10-CM | POA: Diagnosis not present

## 2018-10-05 DIAGNOSIS — I110 Hypertensive heart disease with heart failure: Secondary | ICD-10-CM | POA: Diagnosis not present

## 2018-10-05 DIAGNOSIS — I509 Heart failure, unspecified: Secondary | ICD-10-CM | POA: Diagnosis not present

## 2018-10-05 DIAGNOSIS — M5432 Sciatica, left side: Secondary | ICD-10-CM | POA: Diagnosis not present

## 2018-10-05 DIAGNOSIS — Z79899 Other long term (current) drug therapy: Secondary | ICD-10-CM | POA: Diagnosis not present

## 2018-10-29 DIAGNOSIS — K5900 Constipation, unspecified: Secondary | ICD-10-CM | POA: Diagnosis not present

## 2018-10-29 DIAGNOSIS — R109 Unspecified abdominal pain: Secondary | ICD-10-CM | POA: Diagnosis not present

## 2018-10-30 DIAGNOSIS — N400 Enlarged prostate without lower urinary tract symptoms: Secondary | ICD-10-CM | POA: Diagnosis not present

## 2018-10-30 DIAGNOSIS — R1032 Left lower quadrant pain: Secondary | ICD-10-CM | POA: Diagnosis not present

## 2018-10-30 DIAGNOSIS — N390 Urinary tract infection, site not specified: Secondary | ICD-10-CM | POA: Diagnosis not present

## 2018-10-30 DIAGNOSIS — I493 Ventricular premature depolarization: Secondary | ICD-10-CM | POA: Diagnosis not present

## 2018-10-30 DIAGNOSIS — R1031 Right lower quadrant pain: Secondary | ICD-10-CM | POA: Diagnosis not present

## 2018-10-30 DIAGNOSIS — R55 Syncope and collapse: Secondary | ICD-10-CM | POA: Diagnosis not present

## 2018-10-30 DIAGNOSIS — R590 Localized enlarged lymph nodes: Secondary | ICD-10-CM | POA: Diagnosis not present

## 2018-10-30 DIAGNOSIS — R9431 Abnormal electrocardiogram [ECG] [EKG]: Secondary | ICD-10-CM | POA: Diagnosis not present

## 2018-10-30 DIAGNOSIS — R109 Unspecified abdominal pain: Secondary | ICD-10-CM | POA: Diagnosis not present

## 2018-10-30 DIAGNOSIS — R1030 Lower abdominal pain, unspecified: Secondary | ICD-10-CM | POA: Diagnosis not present

## 2018-10-30 DIAGNOSIS — K5900 Constipation, unspecified: Secondary | ICD-10-CM | POA: Diagnosis not present

## 2019-02-09 ENCOUNTER — Other Ambulatory Visit (HOSPITAL_COMMUNITY): Payer: Self-pay | Admitting: Student

## 2019-02-09 ENCOUNTER — Other Ambulatory Visit (HOSPITAL_COMMUNITY): Payer: Self-pay | Admitting: Cardiology

## 2019-03-04 DIAGNOSIS — J449 Chronic obstructive pulmonary disease, unspecified: Secondary | ICD-10-CM | POA: Diagnosis not present

## 2019-03-04 DIAGNOSIS — I5020 Unspecified systolic (congestive) heart failure: Secondary | ICD-10-CM | POA: Diagnosis not present

## 2019-03-04 DIAGNOSIS — Z7901 Long term (current) use of anticoagulants: Secondary | ICD-10-CM | POA: Diagnosis not present

## 2019-03-04 DIAGNOSIS — I4891 Unspecified atrial fibrillation: Secondary | ICD-10-CM | POA: Diagnosis not present

## 2019-03-04 DIAGNOSIS — E785 Hyperlipidemia, unspecified: Secondary | ICD-10-CM | POA: Diagnosis not present

## 2019-03-04 DIAGNOSIS — Z79899 Other long term (current) drug therapy: Secondary | ICD-10-CM | POA: Diagnosis not present

## 2019-03-04 DIAGNOSIS — F17210 Nicotine dependence, cigarettes, uncomplicated: Secondary | ICD-10-CM | POA: Diagnosis not present

## 2019-03-04 DIAGNOSIS — I110 Hypertensive heart disease with heart failure: Secondary | ICD-10-CM | POA: Diagnosis not present

## 2019-03-04 DIAGNOSIS — Z760 Encounter for issue of repeat prescription: Secondary | ICD-10-CM | POA: Diagnosis not present

## 2019-03-04 DIAGNOSIS — Z8679 Personal history of other diseases of the circulatory system: Secondary | ICD-10-CM | POA: Diagnosis not present

## 2019-03-16 DIAGNOSIS — Z6826 Body mass index (BMI) 26.0-26.9, adult: Secondary | ICD-10-CM | POA: Diagnosis not present

## 2019-03-16 DIAGNOSIS — I2510 Atherosclerotic heart disease of native coronary artery without angina pectoris: Secondary | ICD-10-CM | POA: Diagnosis not present

## 2019-03-16 DIAGNOSIS — M25569 Pain in unspecified knee: Secondary | ICD-10-CM | POA: Diagnosis not present

## 2019-03-16 DIAGNOSIS — R350 Frequency of micturition: Secondary | ICD-10-CM | POA: Diagnosis not present

## 2019-03-16 DIAGNOSIS — M170 Bilateral primary osteoarthritis of knee: Secondary | ICD-10-CM | POA: Diagnosis not present

## 2019-03-16 DIAGNOSIS — Z716 Tobacco abuse counseling: Secondary | ICD-10-CM | POA: Diagnosis not present

## 2019-03-16 DIAGNOSIS — D509 Iron deficiency anemia, unspecified: Secondary | ICD-10-CM | POA: Diagnosis not present

## 2019-03-16 DIAGNOSIS — D689 Coagulation defect, unspecified: Secondary | ICD-10-CM | POA: Diagnosis not present

## 2019-03-16 DIAGNOSIS — E7800 Pure hypercholesterolemia, unspecified: Secondary | ICD-10-CM | POA: Diagnosis not present

## 2019-03-16 DIAGNOSIS — J309 Allergic rhinitis, unspecified: Secondary | ICD-10-CM | POA: Diagnosis not present

## 2019-03-16 DIAGNOSIS — E559 Vitamin D deficiency, unspecified: Secondary | ICD-10-CM | POA: Diagnosis not present

## 2019-03-30 DIAGNOSIS — N429 Disorder of prostate, unspecified: Secondary | ICD-10-CM | POA: Diagnosis not present

## 2019-03-30 DIAGNOSIS — I4892 Unspecified atrial flutter: Secondary | ICD-10-CM | POA: Diagnosis not present

## 2019-03-30 DIAGNOSIS — Z0001 Encounter for general adult medical examination with abnormal findings: Secondary | ICD-10-CM | POA: Diagnosis not present

## 2019-03-30 DIAGNOSIS — I10 Essential (primary) hypertension: Secondary | ICD-10-CM | POA: Diagnosis not present

## 2019-03-30 DIAGNOSIS — R209 Unspecified disturbances of skin sensation: Secondary | ICD-10-CM | POA: Diagnosis not present

## 2019-03-30 DIAGNOSIS — Z6825 Body mass index (BMI) 25.0-25.9, adult: Secondary | ICD-10-CM | POA: Diagnosis not present

## 2019-04-11 DIAGNOSIS — I25119 Atherosclerotic heart disease of native coronary artery with unspecified angina pectoris: Secondary | ICD-10-CM | POA: Diagnosis not present

## 2019-04-11 DIAGNOSIS — Z6825 Body mass index (BMI) 25.0-25.9, adult: Secondary | ICD-10-CM | POA: Diagnosis not present

## 2019-04-11 DIAGNOSIS — E785 Hyperlipidemia, unspecified: Secondary | ICD-10-CM | POA: Diagnosis not present

## 2019-04-11 DIAGNOSIS — I10 Essential (primary) hypertension: Secondary | ICD-10-CM | POA: Diagnosis not present

## 2019-04-11 DIAGNOSIS — M170 Bilateral primary osteoarthritis of knee: Secondary | ICD-10-CM | POA: Diagnosis not present

## 2019-04-11 DIAGNOSIS — I700 Atherosclerosis of aorta: Secondary | ICD-10-CM | POA: Diagnosis not present

## 2019-04-17 DIAGNOSIS — S12200A Unspecified displaced fracture of third cervical vertebra, initial encounter for closed fracture: Secondary | ICD-10-CM | POA: Diagnosis not present

## 2019-04-17 DIAGNOSIS — M545 Low back pain: Secondary | ICD-10-CM | POA: Diagnosis not present

## 2019-04-17 DIAGNOSIS — S3993XA Unspecified injury of pelvis, initial encounter: Secondary | ICD-10-CM | POA: Diagnosis not present

## 2019-04-17 DIAGNOSIS — S0990XA Unspecified injury of head, initial encounter: Secondary | ICD-10-CM | POA: Diagnosis not present

## 2019-04-17 DIAGNOSIS — S79912A Unspecified injury of left hip, initial encounter: Secondary | ICD-10-CM | POA: Diagnosis not present

## 2019-04-17 DIAGNOSIS — S129XXA Fracture of neck, unspecified, initial encounter: Secondary | ICD-10-CM | POA: Diagnosis not present

## 2019-04-18 DIAGNOSIS — C7951 Secondary malignant neoplasm of bone: Secondary | ICD-10-CM | POA: Diagnosis not present

## 2019-04-18 DIAGNOSIS — R590 Localized enlarged lymph nodes: Secondary | ICD-10-CM | POA: Diagnosis not present

## 2019-04-18 DIAGNOSIS — C772 Secondary and unspecified malignant neoplasm of intra-abdominal lymph nodes: Secondary | ICD-10-CM | POA: Diagnosis not present

## 2019-04-18 DIAGNOSIS — C61 Malignant neoplasm of prostate: Secondary | ICD-10-CM | POA: Diagnosis not present

## 2019-04-18 DIAGNOSIS — S79912A Unspecified injury of left hip, initial encounter: Secondary | ICD-10-CM | POA: Diagnosis not present

## 2019-04-18 DIAGNOSIS — S3993XA Unspecified injury of pelvis, initial encounter: Secondary | ICD-10-CM | POA: Diagnosis not present

## 2019-04-18 DIAGNOSIS — C801 Malignant (primary) neoplasm, unspecified: Secondary | ICD-10-CM | POA: Diagnosis not present

## 2019-04-18 DIAGNOSIS — G9589 Other specified diseases of spinal cord: Secondary | ICD-10-CM | POA: Diagnosis not present

## 2019-04-19 DIAGNOSIS — I371 Nonrheumatic pulmonary valve insufficiency: Secondary | ICD-10-CM | POA: Diagnosis not present

## 2019-04-19 DIAGNOSIS — I361 Nonrheumatic tricuspid (valve) insufficiency: Secondary | ICD-10-CM | POA: Diagnosis not present

## 2019-04-19 DIAGNOSIS — W19XXXA Unspecified fall, initial encounter: Secondary | ICD-10-CM | POA: Diagnosis not present

## 2019-04-19 DIAGNOSIS — I340 Nonrheumatic mitral (valve) insufficiency: Secondary | ICD-10-CM | POA: Diagnosis not present

## 2019-04-19 DIAGNOSIS — R9431 Abnormal electrocardiogram [ECG] [EKG]: Secondary | ICD-10-CM | POA: Diagnosis not present

## 2019-04-19 DIAGNOSIS — Z515 Encounter for palliative care: Secondary | ICD-10-CM | POA: Diagnosis not present

## 2019-04-19 DIAGNOSIS — S129XXA Fracture of neck, unspecified, initial encounter: Secondary | ICD-10-CM | POA: Diagnosis not present

## 2019-04-19 DIAGNOSIS — I4891 Unspecified atrial fibrillation: Secondary | ICD-10-CM | POA: Diagnosis not present

## 2019-04-19 DIAGNOSIS — G893 Neoplasm related pain (acute) (chronic): Secondary | ICD-10-CM | POA: Diagnosis not present

## 2019-04-19 DIAGNOSIS — C61 Malignant neoplasm of prostate: Secondary | ICD-10-CM | POA: Diagnosis not present

## 2019-04-19 DIAGNOSIS — I517 Cardiomegaly: Secondary | ICD-10-CM | POA: Diagnosis not present

## 2019-04-19 DIAGNOSIS — C7951 Secondary malignant neoplasm of bone: Secondary | ICD-10-CM | POA: Diagnosis not present

## 2019-04-20 ENCOUNTER — Other Ambulatory Visit (HOSPITAL_COMMUNITY): Payer: Self-pay | Admitting: Student

## 2019-04-20 DIAGNOSIS — W19XXXA Unspecified fall, initial encounter: Secondary | ICD-10-CM | POA: Diagnosis not present

## 2019-04-20 DIAGNOSIS — C61 Malignant neoplasm of prostate: Secondary | ICD-10-CM | POA: Diagnosis not present

## 2019-04-20 DIAGNOSIS — I4821 Permanent atrial fibrillation: Secondary | ICD-10-CM | POA: Diagnosis not present

## 2019-04-20 DIAGNOSIS — S129XXA Fracture of neck, unspecified, initial encounter: Secondary | ICD-10-CM | POA: Diagnosis not present

## 2019-04-21 DIAGNOSIS — C61 Malignant neoplasm of prostate: Secondary | ICD-10-CM | POA: Diagnosis not present

## 2019-04-21 DIAGNOSIS — C7951 Secondary malignant neoplasm of bone: Secondary | ICD-10-CM | POA: Diagnosis not present

## 2019-04-21 DIAGNOSIS — G893 Neoplasm related pain (acute) (chronic): Secondary | ICD-10-CM | POA: Diagnosis not present

## 2019-04-29 DIAGNOSIS — C61 Malignant neoplasm of prostate: Secondary | ICD-10-CM | POA: Diagnosis not present

## 2019-04-29 DIAGNOSIS — M792 Neuralgia and neuritis, unspecified: Secondary | ICD-10-CM | POA: Diagnosis not present

## 2019-04-29 DIAGNOSIS — Z515 Encounter for palliative care: Secondary | ICD-10-CM | POA: Diagnosis not present

## 2019-04-29 DIAGNOSIS — I5022 Chronic systolic (congestive) heart failure: Secondary | ICD-10-CM | POA: Diagnosis not present

## 2019-04-29 DIAGNOSIS — I4821 Permanent atrial fibrillation: Secondary | ICD-10-CM | POA: Diagnosis not present

## 2019-04-29 DIAGNOSIS — G893 Neoplasm related pain (acute) (chronic): Secondary | ICD-10-CM | POA: Diagnosis not present

## 2019-04-29 DIAGNOSIS — B1920 Unspecified viral hepatitis C without hepatic coma: Secondary | ICD-10-CM | POA: Diagnosis not present

## 2019-05-04 DIAGNOSIS — Z1211 Encounter for screening for malignant neoplasm of colon: Secondary | ICD-10-CM | POA: Diagnosis not present

## 2019-05-04 DIAGNOSIS — B1920 Unspecified viral hepatitis C without hepatic coma: Secondary | ICD-10-CM | POA: Diagnosis not present

## 2019-05-04 DIAGNOSIS — I110 Hypertensive heart disease with heart failure: Secondary | ICD-10-CM | POA: Diagnosis not present

## 2019-05-04 DIAGNOSIS — I5022 Chronic systolic (congestive) heart failure: Secondary | ICD-10-CM | POA: Diagnosis not present

## 2019-05-04 DIAGNOSIS — C61 Malignant neoplasm of prostate: Secondary | ICD-10-CM | POA: Diagnosis not present

## 2019-05-04 DIAGNOSIS — K5900 Constipation, unspecified: Secondary | ICD-10-CM | POA: Diagnosis not present

## 2019-05-04 DIAGNOSIS — I4891 Unspecified atrial fibrillation: Secondary | ICD-10-CM | POA: Diagnosis not present

## 2019-05-10 DIAGNOSIS — Z1211 Encounter for screening for malignant neoplasm of colon: Secondary | ICD-10-CM | POA: Diagnosis not present

## 2019-05-12 ENCOUNTER — Encounter: Payer: Self-pay | Admitting: Family Medicine

## 2019-05-12 DIAGNOSIS — B1920 Unspecified viral hepatitis C without hepatic coma: Secondary | ICD-10-CM | POA: Insufficient documentation

## 2019-05-13 DIAGNOSIS — C61 Malignant neoplasm of prostate: Secondary | ICD-10-CM | POA: Diagnosis not present

## 2019-05-13 DIAGNOSIS — I110 Hypertensive heart disease with heart failure: Secondary | ICD-10-CM | POA: Diagnosis not present

## 2019-05-13 DIAGNOSIS — I5022 Chronic systolic (congestive) heart failure: Secondary | ICD-10-CM | POA: Diagnosis not present

## 2019-05-13 DIAGNOSIS — Z7901 Long term (current) use of anticoagulants: Secondary | ICD-10-CM | POA: Diagnosis not present

## 2019-05-13 DIAGNOSIS — M5442 Lumbago with sciatica, left side: Secondary | ICD-10-CM | POA: Diagnosis not present

## 2019-05-13 DIAGNOSIS — F17210 Nicotine dependence, cigarettes, uncomplicated: Secondary | ICD-10-CM | POA: Diagnosis not present

## 2019-05-13 DIAGNOSIS — J449 Chronic obstructive pulmonary disease, unspecified: Secondary | ICD-10-CM | POA: Diagnosis not present

## 2019-05-13 DIAGNOSIS — C799 Secondary malignant neoplasm of unspecified site: Secondary | ICD-10-CM | POA: Diagnosis not present

## 2019-05-13 DIAGNOSIS — Z79899 Other long term (current) drug therapy: Secondary | ICD-10-CM | POA: Diagnosis not present

## 2019-05-13 DIAGNOSIS — M5440 Lumbago with sciatica, unspecified side: Secondary | ICD-10-CM | POA: Diagnosis not present

## 2019-05-13 DIAGNOSIS — I482 Chronic atrial fibrillation, unspecified: Secondary | ICD-10-CM | POA: Diagnosis not present

## 2019-05-13 DIAGNOSIS — Z590 Homelessness: Secondary | ICD-10-CM | POA: Diagnosis not present

## 2019-05-20 DIAGNOSIS — C61 Malignant neoplasm of prostate: Secondary | ICD-10-CM | POA: Diagnosis not present

## 2019-05-23 ENCOUNTER — Other Ambulatory Visit: Payer: Self-pay

## 2019-05-27 DIAGNOSIS — R112 Nausea with vomiting, unspecified: Secondary | ICD-10-CM | POA: Diagnosis not present

## 2019-05-27 DIAGNOSIS — Z79899 Other long term (current) drug therapy: Secondary | ICD-10-CM | POA: Diagnosis not present

## 2019-05-27 DIAGNOSIS — Z515 Encounter for palliative care: Secondary | ICD-10-CM | POA: Diagnosis not present

## 2019-05-27 DIAGNOSIS — C61 Malignant neoplasm of prostate: Secondary | ICD-10-CM | POA: Diagnosis not present

## 2019-05-27 DIAGNOSIS — I110 Hypertensive heart disease with heart failure: Secondary | ICD-10-CM | POA: Diagnosis not present

## 2019-05-27 DIAGNOSIS — R1030 Lower abdominal pain, unspecified: Secondary | ICD-10-CM | POA: Diagnosis not present

## 2019-05-27 DIAGNOSIS — F17210 Nicotine dependence, cigarettes, uncomplicated: Secondary | ICD-10-CM | POA: Diagnosis not present

## 2019-05-27 DIAGNOSIS — Z7901 Long term (current) use of anticoagulants: Secondary | ICD-10-CM | POA: Diagnosis not present

## 2019-05-27 DIAGNOSIS — I5022 Chronic systolic (congestive) heart failure: Secondary | ICD-10-CM | POA: Diagnosis not present

## 2019-05-27 DIAGNOSIS — I4891 Unspecified atrial fibrillation: Secondary | ICD-10-CM | POA: Diagnosis not present

## 2019-05-27 DIAGNOSIS — J449 Chronic obstructive pulmonary disease, unspecified: Secondary | ICD-10-CM | POA: Diagnosis not present

## 2019-05-27 DIAGNOSIS — G893 Neoplasm related pain (acute) (chronic): Secondary | ICD-10-CM | POA: Diagnosis not present

## 2019-06-07 DIAGNOSIS — G893 Neoplasm related pain (acute) (chronic): Secondary | ICD-10-CM | POA: Diagnosis not present

## 2019-06-07 DIAGNOSIS — Z515 Encounter for palliative care: Secondary | ICD-10-CM | POA: Diagnosis not present

## 2019-07-01 DIAGNOSIS — K029 Dental caries, unspecified: Secondary | ICD-10-CM | POA: Diagnosis not present

## 2019-07-01 DIAGNOSIS — C61 Malignant neoplasm of prostate: Secondary | ICD-10-CM | POA: Diagnosis not present

## 2019-07-01 DIAGNOSIS — B1920 Unspecified viral hepatitis C without hepatic coma: Secondary | ICD-10-CM | POA: Diagnosis not present

## 2019-07-13 DIAGNOSIS — Z886 Allergy status to analgesic agent status: Secondary | ICD-10-CM | POA: Diagnosis not present

## 2019-07-13 DIAGNOSIS — I110 Hypertensive heart disease with heart failure: Secondary | ICD-10-CM | POA: Diagnosis not present

## 2019-07-13 DIAGNOSIS — R0789 Other chest pain: Secondary | ICD-10-CM | POA: Diagnosis not present

## 2019-07-13 DIAGNOSIS — M792 Neuralgia and neuritis, unspecified: Secondary | ICD-10-CM | POA: Diagnosis not present

## 2019-07-13 DIAGNOSIS — B1920 Unspecified viral hepatitis C without hepatic coma: Secondary | ICD-10-CM | POA: Diagnosis not present

## 2019-07-13 DIAGNOSIS — I5020 Unspecified systolic (congestive) heart failure: Secondary | ICD-10-CM | POA: Diagnosis not present

## 2019-07-13 DIAGNOSIS — C7951 Secondary malignant neoplasm of bone: Secondary | ICD-10-CM | POA: Diagnosis not present

## 2019-07-13 DIAGNOSIS — C801 Malignant (primary) neoplasm, unspecified: Secondary | ICD-10-CM | POA: Diagnosis not present

## 2019-07-13 DIAGNOSIS — I4891 Unspecified atrial fibrillation: Secondary | ICD-10-CM | POA: Diagnosis not present

## 2019-07-13 DIAGNOSIS — C61 Malignant neoplasm of prostate: Secondary | ICD-10-CM | POA: Diagnosis not present

## 2019-07-13 DIAGNOSIS — Z791 Long term (current) use of non-steroidal anti-inflammatories (NSAID): Secondary | ICD-10-CM | POA: Diagnosis not present

## 2019-07-13 DIAGNOSIS — I340 Nonrheumatic mitral (valve) insufficiency: Secondary | ICD-10-CM | POA: Diagnosis not present

## 2019-07-13 DIAGNOSIS — R079 Chest pain, unspecified: Secondary | ICD-10-CM | POA: Diagnosis not present

## 2019-07-13 DIAGNOSIS — Z7901 Long term (current) use of anticoagulants: Secondary | ICD-10-CM | POA: Diagnosis not present

## 2019-07-13 DIAGNOSIS — F17210 Nicotine dependence, cigarettes, uncomplicated: Secondary | ICD-10-CM | POA: Diagnosis not present

## 2019-07-13 DIAGNOSIS — I5022 Chronic systolic (congestive) heart failure: Secondary | ICD-10-CM | POA: Diagnosis not present

## 2019-07-13 DIAGNOSIS — Z888 Allergy status to other drugs, medicaments and biological substances status: Secondary | ICD-10-CM | POA: Diagnosis not present

## 2019-07-13 DIAGNOSIS — I428 Other cardiomyopathies: Secondary | ICD-10-CM | POA: Diagnosis not present

## 2019-07-13 DIAGNOSIS — Z79899 Other long term (current) drug therapy: Secondary | ICD-10-CM | POA: Diagnosis not present

## 2019-07-13 DIAGNOSIS — I10 Essential (primary) hypertension: Secondary | ICD-10-CM | POA: Diagnosis not present

## 2019-07-13 DIAGNOSIS — Z20828 Contact with and (suspected) exposure to other viral communicable diseases: Secondary | ICD-10-CM | POA: Diagnosis not present

## 2019-07-13 DIAGNOSIS — C7800 Secondary malignant neoplasm of unspecified lung: Secondary | ICD-10-CM | POA: Diagnosis not present

## 2019-07-13 DIAGNOSIS — I4821 Permanent atrial fibrillation: Secondary | ICD-10-CM | POA: Diagnosis not present

## 2019-07-14 DIAGNOSIS — I10 Essential (primary) hypertension: Secondary | ICD-10-CM | POA: Diagnosis not present

## 2019-07-14 DIAGNOSIS — R079 Chest pain, unspecified: Secondary | ICD-10-CM | POA: Diagnosis not present

## 2019-07-14 DIAGNOSIS — I5022 Chronic systolic (congestive) heart failure: Secondary | ICD-10-CM | POA: Diagnosis not present

## 2019-07-14 DIAGNOSIS — I4821 Permanent atrial fibrillation: Secondary | ICD-10-CM | POA: Diagnosis not present

## 2019-07-14 DIAGNOSIS — I5020 Unspecified systolic (congestive) heart failure: Secondary | ICD-10-CM | POA: Diagnosis not present

## 2019-07-14 DIAGNOSIS — C61 Malignant neoplasm of prostate: Secondary | ICD-10-CM | POA: Diagnosis not present

## 2019-07-14 DIAGNOSIS — I340 Nonrheumatic mitral (valve) insufficiency: Secondary | ICD-10-CM | POA: Diagnosis not present

## 2019-07-14 DIAGNOSIS — I428 Other cardiomyopathies: Secondary | ICD-10-CM | POA: Diagnosis not present

## 2019-07-14 DIAGNOSIS — I517 Cardiomegaly: Secondary | ICD-10-CM | POA: Diagnosis not present

## 2019-07-23 DIAGNOSIS — R0781 Pleurodynia: Secondary | ICD-10-CM | POA: Diagnosis not present

## 2019-07-23 DIAGNOSIS — M79604 Pain in right leg: Secondary | ICD-10-CM | POA: Diagnosis not present

## 2019-07-23 DIAGNOSIS — M791 Myalgia, unspecified site: Secondary | ICD-10-CM | POA: Diagnosis not present

## 2019-07-23 DIAGNOSIS — Z8546 Personal history of malignant neoplasm of prostate: Secondary | ICD-10-CM | POA: Diagnosis not present

## 2019-07-23 DIAGNOSIS — R509 Fever, unspecified: Secondary | ICD-10-CM | POA: Diagnosis not present

## 2019-07-23 DIAGNOSIS — R0981 Nasal congestion: Secondary | ICD-10-CM | POA: Diagnosis not present

## 2019-07-23 DIAGNOSIS — B349 Viral infection, unspecified: Secondary | ICD-10-CM | POA: Diagnosis not present

## 2019-07-23 DIAGNOSIS — Z7901 Long term (current) use of anticoagulants: Secondary | ICD-10-CM | POA: Diagnosis not present

## 2019-07-23 DIAGNOSIS — I480 Paroxysmal atrial fibrillation: Secondary | ICD-10-CM | POA: Diagnosis not present

## 2019-07-23 DIAGNOSIS — F17210 Nicotine dependence, cigarettes, uncomplicated: Secondary | ICD-10-CM | POA: Diagnosis not present

## 2019-07-23 DIAGNOSIS — Z79899 Other long term (current) drug therapy: Secondary | ICD-10-CM | POA: Diagnosis not present

## 2019-07-23 DIAGNOSIS — J449 Chronic obstructive pulmonary disease, unspecified: Secondary | ICD-10-CM | POA: Diagnosis not present

## 2019-07-23 DIAGNOSIS — I5022 Chronic systolic (congestive) heart failure: Secondary | ICD-10-CM | POA: Diagnosis not present

## 2019-07-23 DIAGNOSIS — M79605 Pain in left leg: Secondary | ICD-10-CM | POA: Diagnosis not present

## 2019-07-23 DIAGNOSIS — Z20828 Contact with and (suspected) exposure to other viral communicable diseases: Secondary | ICD-10-CM | POA: Diagnosis not present

## 2019-07-23 DIAGNOSIS — I110 Hypertensive heart disease with heart failure: Secondary | ICD-10-CM | POA: Diagnosis not present

## 2019-08-30 DIAGNOSIS — T1490XA Injury, unspecified, initial encounter: Secondary | ICD-10-CM | POA: Diagnosis not present

## 2019-08-30 DIAGNOSIS — R109 Unspecified abdominal pain: Secondary | ICD-10-CM | POA: Diagnosis not present

## 2019-08-30 DIAGNOSIS — Z79899 Other long term (current) drug therapy: Secondary | ICD-10-CM | POA: Diagnosis not present

## 2019-08-30 DIAGNOSIS — S2242XA Multiple fractures of ribs, left side, initial encounter for closed fracture: Secondary | ICD-10-CM | POA: Diagnosis not present

## 2019-08-30 DIAGNOSIS — R519 Headache, unspecified: Secondary | ICD-10-CM | POA: Diagnosis not present

## 2019-08-30 DIAGNOSIS — Z1159 Encounter for screening for other viral diseases: Secondary | ICD-10-CM | POA: Diagnosis not present

## 2019-09-03 DIAGNOSIS — S2239XA Fracture of one rib, unspecified side, initial encounter for closed fracture: Secondary | ICD-10-CM | POA: Diagnosis not present

## 2019-09-03 DIAGNOSIS — G8911 Acute pain due to trauma: Secondary | ICD-10-CM | POA: Diagnosis not present

## 2019-09-05 DIAGNOSIS — G8911 Acute pain due to trauma: Secondary | ICD-10-CM | POA: Diagnosis not present

## 2019-09-06 DIAGNOSIS — G629 Polyneuropathy, unspecified: Secondary | ICD-10-CM | POA: Diagnosis not present

## 2019-09-06 DIAGNOSIS — I5022 Chronic systolic (congestive) heart failure: Secondary | ICD-10-CM | POA: Diagnosis not present

## 2019-09-06 DIAGNOSIS — Z9189 Other specified personal risk factors, not elsewhere classified: Secondary | ICD-10-CM | POA: Diagnosis not present

## 2019-09-06 DIAGNOSIS — G479 Sleep disorder, unspecified: Secondary | ICD-10-CM | POA: Diagnosis not present

## 2019-09-06 DIAGNOSIS — J449 Chronic obstructive pulmonary disease, unspecified: Secondary | ICD-10-CM | POA: Diagnosis not present

## 2019-09-06 DIAGNOSIS — G8911 Acute pain due to trauma: Secondary | ICD-10-CM | POA: Diagnosis not present

## 2019-09-06 DIAGNOSIS — M6281 Muscle weakness (generalized): Secondary | ICD-10-CM | POA: Diagnosis not present

## 2019-09-06 DIAGNOSIS — C61 Malignant neoplasm of prostate: Secondary | ICD-10-CM | POA: Diagnosis not present

## 2019-09-06 DIAGNOSIS — R262 Difficulty in walking, not elsewhere classified: Secondary | ICD-10-CM | POA: Diagnosis not present

## 2019-09-06 DIAGNOSIS — S2243XD Multiple fractures of ribs, bilateral, subsequent encounter for fracture with routine healing: Secondary | ICD-10-CM | POA: Diagnosis not present

## 2019-09-06 DIAGNOSIS — I10 Essential (primary) hypertension: Secondary | ICD-10-CM | POA: Diagnosis not present

## 2019-09-06 DIAGNOSIS — Z20828 Contact with and (suspected) exposure to other viral communicable diseases: Secondary | ICD-10-CM | POA: Diagnosis not present

## 2019-09-06 DIAGNOSIS — M545 Low back pain: Secondary | ICD-10-CM | POA: Diagnosis not present

## 2019-09-06 DIAGNOSIS — I4891 Unspecified atrial fibrillation: Secondary | ICD-10-CM | POA: Diagnosis not present

## 2019-09-06 DIAGNOSIS — R4189 Other symptoms and signs involving cognitive functions and awareness: Secondary | ICD-10-CM | POA: Diagnosis not present

## 2019-09-06 DIAGNOSIS — R782 Finding of cocaine in blood: Secondary | ICD-10-CM | POA: Diagnosis not present

## 2019-09-08 DIAGNOSIS — C61 Malignant neoplasm of prostate: Secondary | ICD-10-CM | POA: Diagnosis not present

## 2019-09-08 DIAGNOSIS — I10 Essential (primary) hypertension: Secondary | ICD-10-CM | POA: Diagnosis not present

## 2019-09-08 DIAGNOSIS — I4891 Unspecified atrial fibrillation: Secondary | ICD-10-CM | POA: Diagnosis not present

## 2019-09-08 DIAGNOSIS — J449 Chronic obstructive pulmonary disease, unspecified: Secondary | ICD-10-CM | POA: Diagnosis not present

## 2019-09-12 DIAGNOSIS — F17210 Nicotine dependence, cigarettes, uncomplicated: Secondary | ICD-10-CM | POA: Diagnosis not present

## 2019-09-12 DIAGNOSIS — R0600 Dyspnea, unspecified: Secondary | ICD-10-CM | POA: Diagnosis not present

## 2019-09-12 DIAGNOSIS — I517 Cardiomegaly: Secondary | ICD-10-CM | POA: Diagnosis not present

## 2019-09-12 DIAGNOSIS — I482 Chronic atrial fibrillation, unspecified: Secondary | ICD-10-CM | POA: Diagnosis not present

## 2019-09-12 DIAGNOSIS — C7951 Secondary malignant neoplasm of bone: Secondary | ICD-10-CM | POA: Diagnosis not present

## 2019-09-12 DIAGNOSIS — S2249XA Multiple fractures of ribs, unspecified side, initial encounter for closed fracture: Secondary | ICD-10-CM | POA: Diagnosis not present

## 2019-09-12 DIAGNOSIS — I4891 Unspecified atrial fibrillation: Secondary | ICD-10-CM | POA: Diagnosis not present

## 2019-09-12 DIAGNOSIS — Z8546 Personal history of malignant neoplasm of prostate: Secondary | ICD-10-CM | POA: Diagnosis not present

## 2019-09-12 DIAGNOSIS — Z7901 Long term (current) use of anticoagulants: Secondary | ICD-10-CM | POA: Diagnosis not present

## 2019-09-12 DIAGNOSIS — J90 Pleural effusion, not elsewhere classified: Secondary | ICD-10-CM | POA: Diagnosis not present

## 2019-09-12 DIAGNOSIS — R079 Chest pain, unspecified: Secondary | ICD-10-CM | POA: Diagnosis not present

## 2019-09-12 DIAGNOSIS — R0789 Other chest pain: Secondary | ICD-10-CM | POA: Diagnosis not present

## 2019-09-12 DIAGNOSIS — S2241XA Multiple fractures of ribs, right side, initial encounter for closed fracture: Secondary | ICD-10-CM | POA: Diagnosis not present

## 2021-08-20 DIAGNOSIS — G8929 Other chronic pain: Secondary | ICD-10-CM | POA: Diagnosis not present

## 2021-08-20 DIAGNOSIS — E785 Hyperlipidemia, unspecified: Secondary | ICD-10-CM | POA: Diagnosis not present

## 2021-08-20 DIAGNOSIS — Z8616 Personal history of COVID-19: Secondary | ICD-10-CM | POA: Diagnosis not present

## 2021-08-20 DIAGNOSIS — Z79899 Other long term (current) drug therapy: Secondary | ICD-10-CM | POA: Diagnosis not present

## 2021-08-20 DIAGNOSIS — I420 Dilated cardiomyopathy: Secondary | ICD-10-CM | POA: Diagnosis not present

## 2021-08-20 DIAGNOSIS — Z79891 Long term (current) use of opiate analgesic: Secondary | ICD-10-CM | POA: Diagnosis not present

## 2021-08-20 DIAGNOSIS — M84451D Pathological fracture, right femur, subsequent encounter for fracture with routine healing: Secondary | ICD-10-CM | POA: Diagnosis not present

## 2021-08-20 DIAGNOSIS — G4700 Insomnia, unspecified: Secondary | ICD-10-CM | POA: Diagnosis not present

## 2021-08-20 DIAGNOSIS — I5042 Chronic combined systolic (congestive) and diastolic (congestive) heart failure: Secondary | ICD-10-CM | POA: Diagnosis not present

## 2021-08-20 DIAGNOSIS — Z7901 Long term (current) use of anticoagulants: Secondary | ICD-10-CM | POA: Diagnosis not present

## 2021-08-20 DIAGNOSIS — R569 Unspecified convulsions: Secondary | ICD-10-CM | POA: Diagnosis not present

## 2021-08-20 DIAGNOSIS — F17200 Nicotine dependence, unspecified, uncomplicated: Secondary | ICD-10-CM | POA: Diagnosis not present

## 2021-08-20 DIAGNOSIS — I480 Paroxysmal atrial fibrillation: Secondary | ICD-10-CM | POA: Diagnosis not present

## 2021-08-20 DIAGNOSIS — M479 Spondylosis, unspecified: Secondary | ICD-10-CM | POA: Diagnosis not present

## 2021-08-20 DIAGNOSIS — I2510 Atherosclerotic heart disease of native coronary artery without angina pectoris: Secondary | ICD-10-CM | POA: Diagnosis not present

## 2021-08-20 DIAGNOSIS — I110 Hypertensive heart disease with heart failure: Secondary | ICD-10-CM | POA: Diagnosis not present

## 2021-08-20 DIAGNOSIS — N138 Other obstructive and reflux uropathy: Secondary | ICD-10-CM | POA: Diagnosis not present

## 2021-08-20 DIAGNOSIS — C7951 Secondary malignant neoplasm of bone: Secondary | ICD-10-CM | POA: Diagnosis not present

## 2021-08-20 DIAGNOSIS — G629 Polyneuropathy, unspecified: Secondary | ICD-10-CM | POA: Diagnosis not present

## 2021-08-20 DIAGNOSIS — J449 Chronic obstructive pulmonary disease, unspecified: Secondary | ICD-10-CM | POA: Diagnosis not present

## 2021-08-21 DIAGNOSIS — Z79891 Long term (current) use of opiate analgesic: Secondary | ICD-10-CM | POA: Diagnosis not present

## 2021-08-21 DIAGNOSIS — Z79899 Other long term (current) drug therapy: Secondary | ICD-10-CM | POA: Diagnosis not present

## 2021-08-21 DIAGNOSIS — Z8616 Personal history of COVID-19: Secondary | ICD-10-CM | POA: Diagnosis not present

## 2021-08-21 DIAGNOSIS — E785 Hyperlipidemia, unspecified: Secondary | ICD-10-CM | POA: Diagnosis not present

## 2021-08-21 DIAGNOSIS — I480 Paroxysmal atrial fibrillation: Secondary | ICD-10-CM | POA: Diagnosis not present

## 2021-08-21 DIAGNOSIS — J449 Chronic obstructive pulmonary disease, unspecified: Secondary | ICD-10-CM | POA: Diagnosis not present

## 2021-08-21 DIAGNOSIS — G8929 Other chronic pain: Secondary | ICD-10-CM | POA: Diagnosis not present

## 2021-08-21 DIAGNOSIS — N138 Other obstructive and reflux uropathy: Secondary | ICD-10-CM | POA: Diagnosis not present

## 2021-08-21 DIAGNOSIS — C7951 Secondary malignant neoplasm of bone: Secondary | ICD-10-CM | POA: Diagnosis not present

## 2021-08-21 DIAGNOSIS — R569 Unspecified convulsions: Secondary | ICD-10-CM | POA: Diagnosis not present

## 2021-08-21 DIAGNOSIS — I5042 Chronic combined systolic (congestive) and diastolic (congestive) heart failure: Secondary | ICD-10-CM | POA: Diagnosis not present

## 2021-08-21 DIAGNOSIS — I420 Dilated cardiomyopathy: Secondary | ICD-10-CM | POA: Diagnosis not present

## 2021-08-21 DIAGNOSIS — I2510 Atherosclerotic heart disease of native coronary artery without angina pectoris: Secondary | ICD-10-CM | POA: Diagnosis not present

## 2021-08-21 DIAGNOSIS — M479 Spondylosis, unspecified: Secondary | ICD-10-CM | POA: Diagnosis not present

## 2021-08-21 DIAGNOSIS — G629 Polyneuropathy, unspecified: Secondary | ICD-10-CM | POA: Diagnosis not present

## 2021-08-21 DIAGNOSIS — F17200 Nicotine dependence, unspecified, uncomplicated: Secondary | ICD-10-CM | POA: Diagnosis not present

## 2021-08-21 DIAGNOSIS — G4700 Insomnia, unspecified: Secondary | ICD-10-CM | POA: Diagnosis not present

## 2021-08-21 DIAGNOSIS — Z7901 Long term (current) use of anticoagulants: Secondary | ICD-10-CM | POA: Diagnosis not present

## 2021-08-21 DIAGNOSIS — I110 Hypertensive heart disease with heart failure: Secondary | ICD-10-CM | POA: Diagnosis not present

## 2021-08-21 DIAGNOSIS — M84451D Pathological fracture, right femur, subsequent encounter for fracture with routine healing: Secondary | ICD-10-CM | POA: Diagnosis not present

## 2021-08-22 DIAGNOSIS — K056 Periodontal disease, unspecified: Secondary | ICD-10-CM | POA: Diagnosis not present

## 2021-08-22 DIAGNOSIS — K029 Dental caries, unspecified: Secondary | ICD-10-CM | POA: Diagnosis not present

## 2021-08-28 DIAGNOSIS — M84451D Pathological fracture, right femur, subsequent encounter for fracture with routine healing: Secondary | ICD-10-CM | POA: Diagnosis not present

## 2021-08-28 DIAGNOSIS — I5042 Chronic combined systolic (congestive) and diastolic (congestive) heart failure: Secondary | ICD-10-CM | POA: Diagnosis not present

## 2021-08-28 DIAGNOSIS — E785 Hyperlipidemia, unspecified: Secondary | ICD-10-CM | POA: Diagnosis not present

## 2021-08-28 DIAGNOSIS — R569 Unspecified convulsions: Secondary | ICD-10-CM | POA: Diagnosis not present

## 2021-08-28 DIAGNOSIS — I110 Hypertensive heart disease with heart failure: Secondary | ICD-10-CM | POA: Diagnosis not present

## 2021-08-28 DIAGNOSIS — I2510 Atherosclerotic heart disease of native coronary artery without angina pectoris: Secondary | ICD-10-CM | POA: Diagnosis not present

## 2021-08-28 DIAGNOSIS — J449 Chronic obstructive pulmonary disease, unspecified: Secondary | ICD-10-CM | POA: Diagnosis not present

## 2021-08-28 DIAGNOSIS — F17200 Nicotine dependence, unspecified, uncomplicated: Secondary | ICD-10-CM | POA: Diagnosis not present

## 2021-08-28 DIAGNOSIS — C7951 Secondary malignant neoplasm of bone: Secondary | ICD-10-CM | POA: Diagnosis not present

## 2021-08-28 DIAGNOSIS — G629 Polyneuropathy, unspecified: Secondary | ICD-10-CM | POA: Diagnosis not present

## 2021-08-28 DIAGNOSIS — Z79891 Long term (current) use of opiate analgesic: Secondary | ICD-10-CM | POA: Diagnosis not present

## 2021-08-28 DIAGNOSIS — Z8616 Personal history of COVID-19: Secondary | ICD-10-CM | POA: Diagnosis not present

## 2021-08-28 DIAGNOSIS — I480 Paroxysmal atrial fibrillation: Secondary | ICD-10-CM | POA: Diagnosis not present

## 2021-08-28 DIAGNOSIS — G4700 Insomnia, unspecified: Secondary | ICD-10-CM | POA: Diagnosis not present

## 2021-08-28 DIAGNOSIS — Z79899 Other long term (current) drug therapy: Secondary | ICD-10-CM | POA: Diagnosis not present

## 2021-08-28 DIAGNOSIS — N138 Other obstructive and reflux uropathy: Secondary | ICD-10-CM | POA: Diagnosis not present

## 2021-08-28 DIAGNOSIS — M479 Spondylosis, unspecified: Secondary | ICD-10-CM | POA: Diagnosis not present

## 2021-08-28 DIAGNOSIS — Z7901 Long term (current) use of anticoagulants: Secondary | ICD-10-CM | POA: Diagnosis not present

## 2021-08-28 DIAGNOSIS — I420 Dilated cardiomyopathy: Secondary | ICD-10-CM | POA: Diagnosis not present

## 2021-08-28 DIAGNOSIS — G8929 Other chronic pain: Secondary | ICD-10-CM | POA: Diagnosis not present

## 2021-09-06 DIAGNOSIS — F17200 Nicotine dependence, unspecified, uncomplicated: Secondary | ICD-10-CM | POA: Diagnosis not present

## 2021-09-06 DIAGNOSIS — R569 Unspecified convulsions: Secondary | ICD-10-CM | POA: Diagnosis not present

## 2021-09-06 DIAGNOSIS — M84451D Pathological fracture, right femur, subsequent encounter for fracture with routine healing: Secondary | ICD-10-CM | POA: Diagnosis not present

## 2021-09-06 DIAGNOSIS — M479 Spondylosis, unspecified: Secondary | ICD-10-CM | POA: Diagnosis not present

## 2021-09-06 DIAGNOSIS — G629 Polyneuropathy, unspecified: Secondary | ICD-10-CM | POA: Diagnosis not present

## 2021-09-06 DIAGNOSIS — E785 Hyperlipidemia, unspecified: Secondary | ICD-10-CM | POA: Diagnosis not present

## 2021-09-06 DIAGNOSIS — G4700 Insomnia, unspecified: Secondary | ICD-10-CM | POA: Diagnosis not present

## 2021-09-06 DIAGNOSIS — I5042 Chronic combined systolic (congestive) and diastolic (congestive) heart failure: Secondary | ICD-10-CM | POA: Diagnosis not present

## 2021-09-06 DIAGNOSIS — C7951 Secondary malignant neoplasm of bone: Secondary | ICD-10-CM | POA: Diagnosis not present

## 2021-09-06 DIAGNOSIS — Z79891 Long term (current) use of opiate analgesic: Secondary | ICD-10-CM | POA: Diagnosis not present

## 2021-09-06 DIAGNOSIS — Z7901 Long term (current) use of anticoagulants: Secondary | ICD-10-CM | POA: Diagnosis not present

## 2021-09-06 DIAGNOSIS — I420 Dilated cardiomyopathy: Secondary | ICD-10-CM | POA: Diagnosis not present

## 2021-09-06 DIAGNOSIS — J449 Chronic obstructive pulmonary disease, unspecified: Secondary | ICD-10-CM | POA: Diagnosis not present

## 2021-09-06 DIAGNOSIS — I110 Hypertensive heart disease with heart failure: Secondary | ICD-10-CM | POA: Diagnosis not present

## 2021-09-06 DIAGNOSIS — Z8616 Personal history of COVID-19: Secondary | ICD-10-CM | POA: Diagnosis not present

## 2021-09-06 DIAGNOSIS — Z79899 Other long term (current) drug therapy: Secondary | ICD-10-CM | POA: Diagnosis not present

## 2021-09-06 DIAGNOSIS — I480 Paroxysmal atrial fibrillation: Secondary | ICD-10-CM | POA: Diagnosis not present

## 2021-09-06 DIAGNOSIS — I2510 Atherosclerotic heart disease of native coronary artery without angina pectoris: Secondary | ICD-10-CM | POA: Diagnosis not present

## 2021-09-06 DIAGNOSIS — N138 Other obstructive and reflux uropathy: Secondary | ICD-10-CM | POA: Diagnosis not present

## 2021-09-06 DIAGNOSIS — G8929 Other chronic pain: Secondary | ICD-10-CM | POA: Diagnosis not present

## 2021-09-08 DIAGNOSIS — I517 Cardiomegaly: Secondary | ICD-10-CM | POA: Diagnosis not present

## 2021-09-08 DIAGNOSIS — R001 Bradycardia, unspecified: Secondary | ICD-10-CM | POA: Diagnosis not present

## 2021-09-11 DIAGNOSIS — C7951 Secondary malignant neoplasm of bone: Secondary | ICD-10-CM | POA: Diagnosis not present

## 2021-09-11 DIAGNOSIS — Z8616 Personal history of COVID-19: Secondary | ICD-10-CM | POA: Diagnosis not present

## 2021-09-11 DIAGNOSIS — I480 Paroxysmal atrial fibrillation: Secondary | ICD-10-CM | POA: Diagnosis not present

## 2021-09-11 DIAGNOSIS — G629 Polyneuropathy, unspecified: Secondary | ICD-10-CM | POA: Diagnosis not present

## 2021-09-11 DIAGNOSIS — F17200 Nicotine dependence, unspecified, uncomplicated: Secondary | ICD-10-CM | POA: Diagnosis not present

## 2021-09-11 DIAGNOSIS — E785 Hyperlipidemia, unspecified: Secondary | ICD-10-CM | POA: Diagnosis not present

## 2021-09-11 DIAGNOSIS — N138 Other obstructive and reflux uropathy: Secondary | ICD-10-CM | POA: Diagnosis not present

## 2021-09-11 DIAGNOSIS — M479 Spondylosis, unspecified: Secondary | ICD-10-CM | POA: Diagnosis not present

## 2021-09-11 DIAGNOSIS — Z79891 Long term (current) use of opiate analgesic: Secondary | ICD-10-CM | POA: Diagnosis not present

## 2021-09-11 DIAGNOSIS — I5042 Chronic combined systolic (congestive) and diastolic (congestive) heart failure: Secondary | ICD-10-CM | POA: Diagnosis not present

## 2021-09-11 DIAGNOSIS — G4700 Insomnia, unspecified: Secondary | ICD-10-CM | POA: Diagnosis not present

## 2021-09-11 DIAGNOSIS — I110 Hypertensive heart disease with heart failure: Secondary | ICD-10-CM | POA: Diagnosis not present

## 2021-09-11 DIAGNOSIS — J449 Chronic obstructive pulmonary disease, unspecified: Secondary | ICD-10-CM | POA: Diagnosis not present

## 2021-09-11 DIAGNOSIS — R569 Unspecified convulsions: Secondary | ICD-10-CM | POA: Diagnosis not present

## 2021-09-11 DIAGNOSIS — Z7901 Long term (current) use of anticoagulants: Secondary | ICD-10-CM | POA: Diagnosis not present

## 2021-09-11 DIAGNOSIS — G8929 Other chronic pain: Secondary | ICD-10-CM | POA: Diagnosis not present

## 2021-09-11 DIAGNOSIS — I2510 Atherosclerotic heart disease of native coronary artery without angina pectoris: Secondary | ICD-10-CM | POA: Diagnosis not present

## 2021-09-11 DIAGNOSIS — Z79899 Other long term (current) drug therapy: Secondary | ICD-10-CM | POA: Diagnosis not present

## 2021-09-11 DIAGNOSIS — I420 Dilated cardiomyopathy: Secondary | ICD-10-CM | POA: Diagnosis not present

## 2021-09-11 DIAGNOSIS — M84451D Pathological fracture, right femur, subsequent encounter for fracture with routine healing: Secondary | ICD-10-CM | POA: Diagnosis not present

## 2021-09-25 DIAGNOSIS — R569 Unspecified convulsions: Secondary | ICD-10-CM | POA: Diagnosis not present

## 2021-09-25 DIAGNOSIS — I110 Hypertensive heart disease with heart failure: Secondary | ICD-10-CM | POA: Diagnosis not present

## 2021-09-25 DIAGNOSIS — F17200 Nicotine dependence, unspecified, uncomplicated: Secondary | ICD-10-CM | POA: Diagnosis not present

## 2021-09-25 DIAGNOSIS — Z79891 Long term (current) use of opiate analgesic: Secondary | ICD-10-CM | POA: Diagnosis not present

## 2021-09-25 DIAGNOSIS — I480 Paroxysmal atrial fibrillation: Secondary | ICD-10-CM | POA: Diagnosis not present

## 2021-09-25 DIAGNOSIS — N138 Other obstructive and reflux uropathy: Secondary | ICD-10-CM | POA: Diagnosis not present

## 2021-09-25 DIAGNOSIS — G4700 Insomnia, unspecified: Secondary | ICD-10-CM | POA: Diagnosis not present

## 2021-09-25 DIAGNOSIS — C7951 Secondary malignant neoplasm of bone: Secondary | ICD-10-CM | POA: Diagnosis not present

## 2021-09-25 DIAGNOSIS — I2510 Atherosclerotic heart disease of native coronary artery without angina pectoris: Secondary | ICD-10-CM | POA: Diagnosis not present

## 2021-09-25 DIAGNOSIS — Z8616 Personal history of COVID-19: Secondary | ICD-10-CM | POA: Diagnosis not present

## 2021-09-25 DIAGNOSIS — M479 Spondylosis, unspecified: Secondary | ICD-10-CM | POA: Diagnosis not present

## 2021-09-25 DIAGNOSIS — G629 Polyneuropathy, unspecified: Secondary | ICD-10-CM | POA: Diagnosis not present

## 2021-09-25 DIAGNOSIS — I420 Dilated cardiomyopathy: Secondary | ICD-10-CM | POA: Diagnosis not present

## 2021-09-25 DIAGNOSIS — E785 Hyperlipidemia, unspecified: Secondary | ICD-10-CM | POA: Diagnosis not present

## 2021-09-25 DIAGNOSIS — M84451D Pathological fracture, right femur, subsequent encounter for fracture with routine healing: Secondary | ICD-10-CM | POA: Diagnosis not present

## 2021-09-25 DIAGNOSIS — Z7901 Long term (current) use of anticoagulants: Secondary | ICD-10-CM | POA: Diagnosis not present

## 2021-09-25 DIAGNOSIS — Z79899 Other long term (current) drug therapy: Secondary | ICD-10-CM | POA: Diagnosis not present

## 2021-09-25 DIAGNOSIS — I5042 Chronic combined systolic (congestive) and diastolic (congestive) heart failure: Secondary | ICD-10-CM | POA: Diagnosis not present

## 2021-09-25 DIAGNOSIS — G8929 Other chronic pain: Secondary | ICD-10-CM | POA: Diagnosis not present

## 2021-09-25 DIAGNOSIS — J449 Chronic obstructive pulmonary disease, unspecified: Secondary | ICD-10-CM | POA: Diagnosis not present

## 2021-10-02 DIAGNOSIS — I5042 Chronic combined systolic (congestive) and diastolic (congestive) heart failure: Secondary | ICD-10-CM | POA: Diagnosis not present

## 2021-10-02 DIAGNOSIS — Z8616 Personal history of COVID-19: Secondary | ICD-10-CM | POA: Diagnosis not present

## 2021-10-02 DIAGNOSIS — G4700 Insomnia, unspecified: Secondary | ICD-10-CM | POA: Diagnosis not present

## 2021-10-02 DIAGNOSIS — I420 Dilated cardiomyopathy: Secondary | ICD-10-CM | POA: Diagnosis not present

## 2021-10-02 DIAGNOSIS — N138 Other obstructive and reflux uropathy: Secondary | ICD-10-CM | POA: Diagnosis not present

## 2021-10-02 DIAGNOSIS — Z79891 Long term (current) use of opiate analgesic: Secondary | ICD-10-CM | POA: Diagnosis not present

## 2021-10-02 DIAGNOSIS — R569 Unspecified convulsions: Secondary | ICD-10-CM | POA: Diagnosis not present

## 2021-10-02 DIAGNOSIS — C7951 Secondary malignant neoplasm of bone: Secondary | ICD-10-CM | POA: Diagnosis not present

## 2021-10-02 DIAGNOSIS — G629 Polyneuropathy, unspecified: Secondary | ICD-10-CM | POA: Diagnosis not present

## 2021-10-02 DIAGNOSIS — E785 Hyperlipidemia, unspecified: Secondary | ICD-10-CM | POA: Diagnosis not present

## 2021-10-02 DIAGNOSIS — G8929 Other chronic pain: Secondary | ICD-10-CM | POA: Diagnosis not present

## 2021-10-02 DIAGNOSIS — Z7901 Long term (current) use of anticoagulants: Secondary | ICD-10-CM | POA: Diagnosis not present

## 2021-10-02 DIAGNOSIS — J449 Chronic obstructive pulmonary disease, unspecified: Secondary | ICD-10-CM | POA: Diagnosis not present

## 2021-10-02 DIAGNOSIS — Z79899 Other long term (current) drug therapy: Secondary | ICD-10-CM | POA: Diagnosis not present

## 2021-10-02 DIAGNOSIS — M479 Spondylosis, unspecified: Secondary | ICD-10-CM | POA: Diagnosis not present

## 2021-10-02 DIAGNOSIS — I2510 Atherosclerotic heart disease of native coronary artery without angina pectoris: Secondary | ICD-10-CM | POA: Diagnosis not present

## 2021-10-02 DIAGNOSIS — I110 Hypertensive heart disease with heart failure: Secondary | ICD-10-CM | POA: Diagnosis not present

## 2021-10-02 DIAGNOSIS — I480 Paroxysmal atrial fibrillation: Secondary | ICD-10-CM | POA: Diagnosis not present

## 2021-10-02 DIAGNOSIS — F17200 Nicotine dependence, unspecified, uncomplicated: Secondary | ICD-10-CM | POA: Diagnosis not present

## 2021-10-02 DIAGNOSIS — M84451D Pathological fracture, right femur, subsequent encounter for fracture with routine healing: Secondary | ICD-10-CM | POA: Diagnosis not present

## 2021-10-08 DIAGNOSIS — Z5111 Encounter for antineoplastic chemotherapy: Secondary | ICD-10-CM | POA: Diagnosis not present

## 2021-10-08 DIAGNOSIS — C7951 Secondary malignant neoplasm of bone: Secondary | ICD-10-CM | POA: Diagnosis not present

## 2022-09-19 DEATH — deceased
